# Patient Record
Sex: Male | Born: 1945
Health system: Southern US, Community
[De-identification: ages and names within clinical notes are randomized; demographics above are authoritative.]

## PROBLEM LIST (undated history)

## (undated) DIAGNOSIS — R2681 Unsteadiness on feet: Secondary | ICD-10-CM

## (undated) DIAGNOSIS — I519 Heart disease, unspecified: Secondary | ICD-10-CM

## (undated) DIAGNOSIS — K649 Unspecified hemorrhoids: Secondary | ICD-10-CM

## (undated) DIAGNOSIS — E7800 Pure hypercholesterolemia, unspecified: Secondary | ICD-10-CM

## (undated) DIAGNOSIS — M6289 Other specified disorders of muscle: Secondary | ICD-10-CM

## (undated) DIAGNOSIS — I629 Nontraumatic intracranial hemorrhage, unspecified: Secondary | ICD-10-CM

## (undated) DIAGNOSIS — I517 Cardiomegaly: Secondary | ICD-10-CM

## (undated) DIAGNOSIS — D751 Secondary polycythemia: Secondary | ICD-10-CM

## (undated) DIAGNOSIS — E669 Obesity, unspecified: Secondary | ICD-10-CM

## (undated) DIAGNOSIS — K648 Other hemorrhoids: Secondary | ICD-10-CM

## (undated) DIAGNOSIS — I10 Essential (primary) hypertension: Secondary | ICD-10-CM

## (undated) DIAGNOSIS — C61 Malignant neoplasm of prostate: Secondary | ICD-10-CM

## (undated) DIAGNOSIS — N201 Calculus of ureter: Secondary | ICD-10-CM

## (undated) DIAGNOSIS — N200 Calculus of kidney: Secondary | ICD-10-CM

## (undated) DIAGNOSIS — I639 Cerebral infarction, unspecified: Secondary | ICD-10-CM

## (undated) DIAGNOSIS — B029 Zoster without complications: Secondary | ICD-10-CM

## (undated) DIAGNOSIS — D369 Benign neoplasm, unspecified site: Secondary | ICD-10-CM

## (undated) DIAGNOSIS — Z87442 Personal history of urinary calculi: Secondary | ICD-10-CM

## (undated) DIAGNOSIS — G08 Intracranial and intraspinal phlebitis and thrombophlebitis: Secondary | ICD-10-CM

## (undated) DIAGNOSIS — G4733 Obstructive sleep apnea (adult) (pediatric): Secondary | ICD-10-CM

## (undated) HISTORY — DX: Zoster without complications: B02.9

## (undated) HISTORY — DX: Obesity, unspecified: E66.9

## (undated) HISTORY — DX: Essential (primary) hypertension: I10

## (undated) HISTORY — DX: Other specified disorders of muscle: M62.89

## (undated) HISTORY — PX: STONE EXTRACTION WITH BASKET: SHX5318

## (undated) HISTORY — DX: Unspecified hemorrhoids: K64.9

## (undated) HISTORY — PX: COLONOSCOPY: SHX174

## (undated) HISTORY — DX: Pure hypercholesterolemia, unspecified: E78.00

## (undated) HISTORY — PX: PROSTATECTOMY: SHX69

## (undated) HISTORY — PX: APPENDECTOMY: SHX54

## (undated) HISTORY — DX: Benign neoplasm, unspecified site: D36.9

## (undated) HISTORY — DX: Calculus of kidney: N20.0

## (undated) HISTORY — DX: Secondary polycythemia: D75.1

## (undated) HISTORY — PX: CHOLECYSTECTOMY: SHX55

---

## 2001-06-10 DIAGNOSIS — C61 Malignant neoplasm of prostate: Secondary | ICD-10-CM

## 2001-06-10 HISTORY — DX: Malignant neoplasm of prostate: C61

## 2001-07-31 ENCOUNTER — Encounter: Admission: RE | Admit: 2001-07-31 | Discharge: 2001-07-31 | Payer: Self-pay | Admitting: Urology

## 2001-07-31 ENCOUNTER — Encounter: Payer: Self-pay | Admitting: Urology

## 2002-05-22 ENCOUNTER — Ambulatory Visit (HOSPITAL_BASED_OUTPATIENT_CLINIC_OR_DEPARTMENT_OTHER): Admission: RE | Admit: 2002-05-22 | Discharge: 2002-05-22 | Payer: Self-pay | Admitting: Geriatric Medicine

## 2002-07-09 ENCOUNTER — Encounter: Admission: RE | Admit: 2002-07-09 | Discharge: 2002-07-09 | Payer: Self-pay | Admitting: Geriatric Medicine

## 2002-07-09 ENCOUNTER — Encounter: Payer: Self-pay | Admitting: Geriatric Medicine

## 2002-08-10 ENCOUNTER — Encounter: Admission: RE | Admit: 2002-08-10 | Discharge: 2002-08-10 | Payer: Self-pay | Admitting: Geriatric Medicine

## 2002-08-10 ENCOUNTER — Encounter: Payer: Self-pay | Admitting: Geriatric Medicine

## 2002-08-18 ENCOUNTER — Encounter: Admission: RE | Admit: 2002-08-18 | Discharge: 2002-08-18 | Payer: Self-pay | Admitting: Geriatric Medicine

## 2002-08-18 ENCOUNTER — Encounter: Payer: Self-pay | Admitting: Geriatric Medicine

## 2003-03-18 ENCOUNTER — Emergency Department (HOSPITAL_COMMUNITY): Admission: EM | Admit: 2003-03-18 | Discharge: 2003-03-18 | Payer: Self-pay | Admitting: Emergency Medicine

## 2003-04-07 ENCOUNTER — Ambulatory Visit (HOSPITAL_BASED_OUTPATIENT_CLINIC_OR_DEPARTMENT_OTHER): Admission: RE | Admit: 2003-04-07 | Discharge: 2003-04-07 | Payer: Self-pay | Admitting: Urology

## 2003-07-03 ENCOUNTER — Ambulatory Visit (HOSPITAL_BASED_OUTPATIENT_CLINIC_OR_DEPARTMENT_OTHER): Admission: RE | Admit: 2003-07-03 | Discharge: 2003-07-03 | Payer: Self-pay | Admitting: Geriatric Medicine

## 2004-12-17 ENCOUNTER — Ambulatory Visit (HOSPITAL_BASED_OUTPATIENT_CLINIC_OR_DEPARTMENT_OTHER): Admission: RE | Admit: 2004-12-17 | Discharge: 2004-12-17 | Payer: Self-pay | Admitting: Urology

## 2004-12-17 ENCOUNTER — Ambulatory Visit (HOSPITAL_COMMUNITY): Admission: RE | Admit: 2004-12-17 | Discharge: 2004-12-17 | Payer: Self-pay | Admitting: Urology

## 2005-01-11 ENCOUNTER — Ambulatory Visit (HOSPITAL_BASED_OUTPATIENT_CLINIC_OR_DEPARTMENT_OTHER): Admission: RE | Admit: 2005-01-11 | Discharge: 2005-01-11 | Payer: Self-pay | Admitting: Urology

## 2008-02-12 DIAGNOSIS — N201 Calculus of ureter: Secondary | ICD-10-CM

## 2008-02-12 HISTORY — DX: Calculus of ureter: N20.1

## 2008-03-03 ENCOUNTER — Ambulatory Visit (HOSPITAL_COMMUNITY): Admission: RE | Admit: 2008-03-03 | Discharge: 2008-03-03 | Payer: Self-pay | Admitting: Urology

## 2008-09-13 ENCOUNTER — Encounter (INDEPENDENT_AMBULATORY_CARE_PROVIDER_SITE_OTHER): Payer: Self-pay | Admitting: *Deleted

## 2009-02-13 ENCOUNTER — Emergency Department (HOSPITAL_COMMUNITY): Admission: EM | Admit: 2009-02-13 | Discharge: 2009-02-13 | Payer: Self-pay | Admitting: Family Medicine

## 2009-02-20 ENCOUNTER — Ambulatory Visit (HOSPITAL_COMMUNITY): Admission: RE | Admit: 2009-02-20 | Discharge: 2009-02-20 | Payer: Self-pay | Admitting: Urology

## 2009-06-22 ENCOUNTER — Telehealth: Payer: Self-pay | Admitting: Gastroenterology

## 2009-07-20 ENCOUNTER — Ambulatory Visit (HOSPITAL_BASED_OUTPATIENT_CLINIC_OR_DEPARTMENT_OTHER): Admission: RE | Admit: 2009-07-20 | Discharge: 2009-07-20 | Payer: Self-pay | Admitting: Urology

## 2010-07-10 NOTE — Progress Notes (Signed)
Summary: Change Practice --- GI  Phone Note Outgoing Call Call back at [facility] Phone [phone]   Call placed by: Harlow Mares CMA Duncan Dull),  June 22, 2009 1:43 PM Call placed to: Patient Summary of Call: called patient to schedule colonoscopy and already had a colonoscopy with another GI in town. She changed practice, I had Yesi put a note in IDX.  Initial call taken by: Harlow Mares CMA (AAMA),  June 22, 2009 1:44 PM

## 2010-09-14 LAB — POCT URINALYSIS DIP (DEVICE)
Nitrite: NEGATIVE
Protein, ur: NEGATIVE mg/dL
pH: 5.5 (ref 5.0–8.0)

## 2010-09-14 LAB — BASIC METABOLIC PANEL
CO2: 26 mEq/L (ref 19–32)
Chloride: 106 mEq/L (ref 96–112)
Creatinine, Ser: 0.97 mg/dL (ref 0.4–1.5)
GFR calc Af Amer: >60 mL/min (ref >60)
Glucose, Bld: 89 mg/dL (ref 70–99)

## 2010-10-26 NOTE — Op Note (Signed)
NAME:  Justin Adams, Justin Adams           ACCOUNT NO.:  [id number]   MEDICAL RECORD NO.:  [id number]          PATIENT TYPE:  AMB   LOCATION:  [facility]                         FACILITY:  [facility]   PHYSICIAN:  Sigmund I. Patsi Sears, M.D.DATE OF BIRTH:  07-Dec-1945   DATE OF PROCEDURE:  01/11/2005  DATE OF DISCHARGE:                                 OPERATIVE REPORT   PREOPERATIVE DIAGNOSIS:  Left ureteral stone.   POSTOPERATIVE DIAGNOSIS:  1.  Left ureteral stone.  2.  Left ureteral stricture.   PROCEDURE:  1.  Cystourethroscopy.  2.  Retrograde pyelogram.  3.  Left ureteral dilatation of stricture.  4.  Left ureteroscopy with laser lithotripsy and stone manipulation.  5.  Left double J stent placement.   SURGEON:  Sigmund I. Patsi Sears, M.D.   ASSISTANT:  Glade Nurse, M.D.   ANESTHESIA:  General endotracheal.   SPECIMENS:  None.   PROCEDURE:  The patient was identified by his wrist bracelet and brought to  room 2 where he received preoperative antibiotics and was prepped and draped  in the usual sterile fashion after receiving general anesthesia. We then  inserted a 22-French cystoscopic sheath, and using 12 and 70 degree lenses,  the patient underwent cystourethroscopy. His anterior urethra was without  abnormalities. Posterior urethra was noted for changes status post radical  prostatectomy. He was noted to have bladder neck contraction upon entering  his bladder. His bilateral ureteral orifices were noted in their normal  anatomic position, effluxing clear urine bilaterally. The remainder of his  mucosa was without foreign body or mucosa abnormality. Next, we turned our  attention to the left ureteral orifice. Using a 6-French end-hole catheter,  retrograde pyelogram was obtained which showed high grade stricture in the  left mid ureter. We then were able to place a 0.038 Glidewire past the  ureteral stricture to the level of the left renal pelvis under direct  fluoroscopic  guidance. We then placed a semi-rigid ureteroscope in the left  ureter and gently manipulated to the level of the ureteral stricture. We  were not able to pass the scope through this point. Next, we back loaded the  scope out of the patient's bladder. Over the wire, we placed an ureteral  dilating balloon which we inflated to 11 atmospheres and held for 5 minutes.  While doing this, we noted the waist at the level of the stricture did not  completely disappear. We then back loaded the balloon off the wire and  reinserted our semi-rigid ureteroscope. We were able to easily pass the  stricture. There was noted to be some element of stone impaction at the  ureteral stricture site. Just proximal to this, we noted a 4-mm ureteral  stone. Then, we inserted the laser fiber, and using settings of 0.6 and 8,  the stone was fragmented into 7-mm pieces which were basketed out with a  Nitinol basket. Once we ensured that all impassible fragments were removed  and only fragments less than 0.5 mm in size were remaining, we removed the  uteroscope, and the cystoscope was back loaded over the wire. We then placed  a  6 x 28 Sof-Flex double J stent to the level of the left renal pelvis with  fluoroscopic guidance. Excellent proximal and distal curl was obtained. The  bladder was then drained. The patient was opened from his anesthesia which  he tolerated without complication and was taken to the PACU in stable,  satisfactory condition.   Dr. Lynelle Smoke I. Patsi Sears was present and participated in all aspects of  this case.       MT/MEDQ  D:  01/11/2005  T:  01/12/2005  Job:  [id number]

## 2010-10-26 NOTE — Op Note (Signed)
   NAME:  Justin Adams, Justin Adams                     ACCOUNT NO.:  [id number]   MEDICAL RECORD NO.:  [id number]                   PATIENT TYPE:  AMB   LOCATION:  [facility]                                 FACILITY:  [facility]   PHYSICIAN:  Sigmund I. Patsi Sears, M.D.         DATE OF BIRTH:  12-22-1945   DATE OF PROCEDURE:  04/07/2003  DATE OF DISCHARGE:                                 OPERATIVE REPORT   PREOPERATIVE DIAGNOSIS:  Retained right distal ureteral stone.   POSTOPERATIVE DIAGNOSIS:  Retained right distal ureteral stone.   OPERATION:  1. Cystourethroscopy.  2. Left retrograde pyelogram with interpretation.  3. Ureteroscopy.  4. Basket extraction of right ureteral stone.  5. Right double J catheter.   SURGEON:  Sigmund I. Patsi Sears, M.D.   ASSISTANT:  Susanne Borders, M.D.   PREPARATION:  After appropriate preanesthesia, the patient was brought to  the operating room and placed on the operating table in the dorsal supine  position, where general LMA anesthesia was introduced.  He was then replaced  in the dorsal lithotomy position, where the pubis was prepped with Betadine  solution and draped in the usual fashion.   DESCRIPTION OF PROCEDURE:  KUB did not show the right ureteral stone which  had been previously seen on noncontrast pelvic CT.  Right retrograde  pyelogram was performed, which showed a 6 mm stone in the distal right  ureter, and right ureteroscopy was accomplished, which showed the stone to  be in the right lower ureter. The stone was basket-extracted, with very  narrow ureteral orifice noted.  Because of trauma to the right ureteral  orifice in extracting the stone, it was elected to pass a double J catheter.  This was passed easily into the renal pelvis and coiled in the renal pelvis  and coiled in the bladder.  A size 6.0 x 28 cm was used.  The patient was  given IV Toradol as well as a B&O suppository.                                               Sigmund I.  Patsi Sears, M.D.    SIT/MEDQ  D:  04/07/2003  T:  04/07/2003  Job:  [id number]

## 2010-10-26 NOTE — Op Note (Signed)
NAME:  MAITLAND, LESIAK           ACCOUNT NO.:  [id number]   MEDICAL RECORD NO.:  [id number]          PATIENT TYPE:  AMB   LOCATION:  [facility]                         FACILITY:  [facility]   PHYSICIAN:  Lindaann Slough, M.D.  DATE OF BIRTH:  1945/07/05   DATE OF PROCEDURE:  12/17/2004  DATE OF DISCHARGE:                                 OPERATIVE REPORT   PREOPERATIVE DIAGNOSIS:  Left ureteral calculus with hydronephrosis.   POSTOPERATIVE DIAGNOSIS:  Left ureteral stricture,  ureteral calculus not  seen.   PROCEDURE:  Cystoscopy, left retrograde pyelogram, ureteroscopy, ureteral  balloon dilation of the ureter and insertion of double-J catheter.   SURGEON:  Wendie Simmer.Nesi, M.D.   ANESTHESIA:  General.   INDICATIONS FOR PROCEDURE:  The patient is a 65 year old male who has a  history of ureteral calculus. In December 2005, a CT scan of the abdomen and  pelvis showed a 3 mm stone in the left ureter at the level of L5. Repeat CT  scan in April 2005 showed the stone in the same location. About three weeks  ago, he started having left flank pain and a repeat CT scan showed the stone  in the same location. He has moderate hydronephrosis. He is scheduled today  for cystoscopy, left retrograde pyelogram, stone manipulation.   Under general anesthesia, the patient was prepped and draped and placed in  the dorsal lithotomy position. A #22 Wappler cystoscope was inserted in the  bladder. The patient had a radical prostatectomy and the bladder neck is  open. There is no stone or tumor in the bladder. The ureteral orifices are  in normal position and shape.   A cone-tip catheter was passed through the cystoscope into the left ureteral  orifice and contrast was then injected through the cone-tip catheter. The  distal ureter appears normal. There is an area of stricture at the level of  the iliac vessels and the ureter proximal to that area is moderately dilated  and the renal pelvis is also moderately  dilated. The cone tip catheter was  then removed.   A guidewire was then passed through the cystoscope into the left ureter. The  cystoscope was then removed. A #6.5 French rigid short ureteroscope was then  passed in the urethra and in the bladder and into the ureter. The  ureteroscope was advanced without difficulty up to the area of stricture but  could not be passed beyond that point. The ureteroscope was then removed. A  ureteroscope access sheath was then passed over the guidewire but could not  be passed beyond the point of stricture. The ureteroscope access sheath was  then removed.  A ureteral balloon catheter was then passed over the  guidewire but could not be passed beyond the point of stricture. The  ureteral balloon catheter was then removed. A #6 [place] open end catheter  was passed over the guidewire through the strictured area. The open end  catheter was then removed, the ureteroscope access sheath was then passed  over the guidewire without the outer sheath and passed through the  strictured area. The ureteroscope access sheath was then removed.  A rigid  ureteroscope was then reinserted in the bladder and in the ureter and  advanced through the mid ureter without difficulty and advanced up to the  upper ureter. There is no evidence of filling defect in the ureter. No stone  is visualized. The short ureteroscope could not be advanced all the way up  into the renal pelvis. The short ureteroscope  was removed. A long  ureteroscope was then passed in the bladder and in the ureter and advanced  all the way up into the renal pelvis and there is no evidence of stone in  the ureter. The ureteroscope was then removed. The guidewire was back loaded  into the cystoscope and a #6 [place] - 26 double-J catheter was passed over  the guidewire. The proximal curl of the double-J catheter is in the renal  pelvis and the distal curl is in the bladder. The bladder was then emptied  and the  cystoscope and guidewire were removed.   The patient tolerated the procedure well and left the OR in satisfactory  condition to post anesthesia care unit.       MN/MEDQ  D:  12/17/2004  T:  12/17/2004  Job:  [id number]

## 2011-10-08 DIAGNOSIS — H353190 Nonexudative age-related macular degeneration, unspecified eye, stage unspecified: Secondary | ICD-10-CM | POA: Diagnosis not present

## 2011-10-08 DIAGNOSIS — H2510 Age-related nuclear cataract, unspecified eye: Secondary | ICD-10-CM | POA: Diagnosis not present

## 2012-03-02 DIAGNOSIS — C61 Malignant neoplasm of prostate: Secondary | ICD-10-CM | POA: Diagnosis not present

## 2012-03-02 DIAGNOSIS — E559 Vitamin D deficiency, unspecified: Secondary | ICD-10-CM | POA: Diagnosis not present

## 2012-03-06 DIAGNOSIS — C61 Malignant neoplasm of prostate: Secondary | ICD-10-CM | POA: Diagnosis not present

## 2012-03-06 DIAGNOSIS — N23 Unspecified renal colic: Secondary | ICD-10-CM | POA: Diagnosis not present

## 2012-04-06 DIAGNOSIS — N393 Stress incontinence (female) (male): Secondary | ICD-10-CM | POA: Diagnosis not present

## 2012-04-06 DIAGNOSIS — N3945 Continuous leakage: Secondary | ICD-10-CM | POA: Diagnosis not present

## 2012-04-06 DIAGNOSIS — R3915 Urgency of urination: Secondary | ICD-10-CM | POA: Diagnosis not present

## 2012-04-14 DIAGNOSIS — Z09 Encounter for follow-up examination after completed treatment for conditions other than malignant neoplasm: Secondary | ICD-10-CM | POA: Diagnosis not present

## 2012-04-14 DIAGNOSIS — Z86010 Personal history of colonic polyps: Secondary | ICD-10-CM | POA: Diagnosis not present

## 2012-05-30 DIAGNOSIS — Z23 Encounter for immunization: Secondary | ICD-10-CM | POA: Diagnosis not present

## 2012-06-23 DIAGNOSIS — H353190 Nonexudative age-related macular degeneration, unspecified eye, stage unspecified: Secondary | ICD-10-CM | POA: Diagnosis not present

## 2012-07-29 DIAGNOSIS — E669 Obesity, unspecified: Secondary | ICD-10-CM | POA: Diagnosis not present

## 2012-07-29 DIAGNOSIS — Z0000 Encounter for general adult medical examination without abnormal findings: Secondary | ICD-10-CM | POA: Diagnosis not present

## 2012-07-29 DIAGNOSIS — E782 Mixed hyperlipidemia: Secondary | ICD-10-CM | POA: Diagnosis not present

## 2012-07-29 DIAGNOSIS — R002 Palpitations: Secondary | ICD-10-CM | POA: Diagnosis not present

## 2012-07-29 DIAGNOSIS — Z79899 Other long term (current) drug therapy: Secondary | ICD-10-CM | POA: Diagnosis not present

## 2012-07-29 DIAGNOSIS — Z1331 Encounter for screening for depression: Secondary | ICD-10-CM | POA: Diagnosis not present

## 2012-09-09 DIAGNOSIS — G4730 Sleep apnea, unspecified: Secondary | ICD-10-CM | POA: Diagnosis not present

## 2012-09-21 DIAGNOSIS — J029 Acute pharyngitis, unspecified: Secondary | ICD-10-CM | POA: Diagnosis not present

## 2013-02-23 DIAGNOSIS — H353190 Nonexudative age-related macular degeneration, unspecified eye, stage unspecified: Secondary | ICD-10-CM | POA: Diagnosis not present

## 2013-02-23 DIAGNOSIS — H25019 Cortical age-related cataract, unspecified eye: Secondary | ICD-10-CM | POA: Diagnosis not present

## 2013-03-03 DIAGNOSIS — C61 Malignant neoplasm of prostate: Secondary | ICD-10-CM | POA: Diagnosis not present

## 2013-03-10 DIAGNOSIS — N200 Calculus of kidney: Secondary | ICD-10-CM | POA: Diagnosis not present

## 2013-03-10 DIAGNOSIS — C61 Malignant neoplasm of prostate: Secondary | ICD-10-CM | POA: Diagnosis not present

## 2013-03-10 DIAGNOSIS — E559 Vitamin D deficiency, unspecified: Secondary | ICD-10-CM | POA: Diagnosis not present

## 2013-03-10 DIAGNOSIS — N393 Stress incontinence (female) (male): Secondary | ICD-10-CM | POA: Diagnosis not present

## 2013-03-10 DIAGNOSIS — R0789 Other chest pain: Secondary | ICD-10-CM | POA: Diagnosis not present

## 2013-03-11 ENCOUNTER — Encounter: Payer: Self-pay | Admitting: Cardiology

## 2013-03-11 ENCOUNTER — Ambulatory Visit (INDEPENDENT_AMBULATORY_CARE_PROVIDER_SITE_OTHER): Payer: Medicare Other | Admitting: Cardiology

## 2013-03-11 ENCOUNTER — Encounter: Payer: Self-pay | Admitting: *Deleted

## 2013-03-11 VITALS — BP 140/74 | HR 74 | Ht 75.0 in | Wt 267.0 lb

## 2013-03-11 DIAGNOSIS — I200 Unstable angina: Secondary | ICD-10-CM

## 2013-03-11 DIAGNOSIS — R079 Chest pain, unspecified: Secondary | ICD-10-CM | POA: Diagnosis not present

## 2013-03-11 MED ORDER — METOPROLOL SUCCINATE ER 50 MG PO TB24: 25.0000 mg | ORAL_TABLET | Freq: Every day | ORAL | Status: DC

## 2013-03-11 MED ORDER — ASPIRIN EC 81 MG PO TBEC: 81.0000 mg | DELAYED_RELEASE_TABLET | Freq: Every day | ORAL | Status: DC

## 2013-03-11 NOTE — Progress Notes (Signed)
HPI The patient presents for evaluation of chest discomfort. He has not had prior coronary disease history though he has had a catheterization and stress test many many years ago. He noticed last week chest tightness when he was exercising on the elliptical. This was a heaviness. It was 3/10 in intensity. There was no associated nausea vomiting or diaphoresis. He was not excessively short of breath. He didn't have any PND or orthopnea. Because this was reproducible on this piece of equipment he stop doing this. He's been able to walk mildly on a treadmill without bringing this on. However, he has been having some resting symptoms to include excessive burping and discomfort after eating. He's not having any PND or orthopnea. He might be having some palpitations when he gets the resting symptoms.   Allergies  Allergen Reactions  . Iodinated Diagnostic Agents Rash    Current Outpatient Prescriptions  Medication Sig Dispense Refill  . cholecalciferol (VITAMIN D) 1000 UNITS tablet Take 1,000 Units by mouth daily.      . Sod Citrate-Citric Acid (CYTRA-2 PO) Take 60 mg by mouth 2 (two) times daily.       No current facility-administered medications for this visit.    Past Medical History  Diagnosis Date  . Obesity   . Hypertension   . Nephrolithiasis   . Cancer 2003    prostate  . Adenomatous polyp   . Sleep apnea   . Hemorrhoids   . Polycythemia   . Hypercholesteremia   . Shingles   . Muscular degeneration     Past Surgical History  Procedure Laterality Date  . Prostatectomy    . Cholecystectomy    . Stone extraction with basket      Family History  Problem Relation Age of Onset  . Hypertension Mother   . Coronary artery disease Mother 44  . Breast cancer Mother   . CAD Brother 48  . Coronary artery disease Father 40    Died age 32    History   Social History  . Marital Status: Married    Spouse Name: N/A    Number of Children: 2  . Years of Education: N/A    Occupational History  . Buyer     [employer]   Social History Main Topics  . Smoking status: Never Smoker   . Smokeless tobacco: Not on file  . Alcohol Use: Not on file  . Drug Use: Not on file  . Sexual Activity: Not on file   Other Topics Concern  . Not on file   Social History Narrative  . No narrative on file    ROS:  As stated in the HPI and negative for all other systems.  PHYSICAL EXAM BP 140/74  Pulse 74  Ht 6\' 3"  (1.905 m)  Wt 267 lb (121.11 kg)  BMI 33.37 kg/m2 GENERAL:  Well appearing HEENT:  Pupils equal round and reactive, fundi not visualized, oral mucosa unremarkable NECK:  No jugular venous distention, waveform within normal limits, carotid upstroke brisk and symmetric, no bruits, no thyromegaly LYMPHATICS:  No cervical, inguinal adenopathy LUNGS:  Clear to auscultation bilaterally BACK:  No CVA tenderness CHEST:  Unremarkable HEART:  PMI not displaced or sustained,S1 and S2 within normal limits, no S3, no S4, no clicks, no rubs, no murmurs ABD:  Flat, positive bowel sounds normal in frequency in pitch, no bruits, no rebound, no guarding, no midline pulsatile mass, no hepatomegaly, no splenomegaly EXT:  2 plus pulses throughout, no edema,  no cyanosis no clubbing SKIN:  No rashes no nodules NEURO:  Cranial nerves II through XII grossly intact, motor grossly intact throughout PSYCH:  Cognitively intact, oriented to person place and time  EKG:  Sinus rhythm, rate 74, axis within normal limits, intervals within normal limits, no acute ST-T wave changes.  ASSESSMENT AND PLAN  CHEST PAIN:  This is consistent with resting chest pain.  This is consistent with unstable anigna.  He has significant risk factors.  The pretest probability of obstructive coronary disease is very high. Given this cardiac catheterization is indicated.  According to appropriate use criteria with these new onset/unstable symptoms cardiac catheterization is the indicated test.  The patient  understands that risks included but are not limited to stroke (1 in 1000), death (1 in 1000), kidney failure [usually temporary] (1 in 500), bleeding (1 in 200), allergic reaction [possibly serious] (1 in 200).  The patient understands and agrees to proceed.  He is gong to start ASA and a low dose of beta blocker.   HTN:  His blood pressure is upper limits of normal.   We will concentrate on therapeutic lifestyle changes to include weight loss.   HYPERLIPIDEMIA:  He says he has not had this.  I will treat this based on findings at the time of cath.

## 2013-03-11 NOTE — Patient Instructions (Addendum)
Please start ASA 81 mg a day and Metoprolol succinate 50 mg 1/2 tablet a day. Continue all other medications as listed.  Your physician has requested that you have a cardiac catheterization. Cardiac catheterization is used to diagnose and/or treat various heart conditions. Doctors may recommend this procedure for a number of different reasons. The most common reason is to evaluate chest pain. Chest pain can be a symptom of coronary artery disease (CAD), and cardiac catheterization can show whether plaque is narrowing or blocking your heart's arteries. This procedure is also used to evaluate the valves, as well as measure the blood flow and oxygen levels in different parts of your heart. For further information please visit [email or web address] Please follow instruction sheet, as given.  Follow up appointment with Dr Antoine Poche will be made after your heart cath.

## 2013-03-12 ENCOUNTER — Other Ambulatory Visit: Payer: Self-pay | Admitting: Cardiology

## 2013-03-12 ENCOUNTER — Encounter (HOSPITAL_COMMUNITY): Payer: Self-pay

## 2013-03-12 ENCOUNTER — Telehealth: Payer: Self-pay | Admitting: Cardiology

## 2013-03-12 DIAGNOSIS — I200 Unstable angina: Secondary | ICD-10-CM | POA: Insufficient documentation

## 2013-03-12 MED ORDER — PREDNISONE 20 MG PO TABS: ORAL_TABLET | ORAL | Status: DC

## 2013-03-12 NOTE — Telephone Encounter (Signed)
Reviewed instructions and times with pt - pre-med.  Pt understands pre-med instructions

## 2013-03-12 NOTE — Telephone Encounter (Signed)
New Problem  Pt request a call back about the heart cath.. How Long will the procedure take.

## 2013-03-15 ENCOUNTER — Ambulatory Visit (HOSPITAL_COMMUNITY)
Admission: RE | Admit: 2013-03-15 | Discharge: 2013-03-15 | Disposition: A | Discharge status: Home / Self Care | Payer: Medicare Other | Source: Ambulatory Visit | Attending: Cardiology | Admitting: Cardiology

## 2013-03-15 ENCOUNTER — Encounter (HOSPITAL_COMMUNITY)
Admission: RE | Disposition: A | Discharge status: Home / Self Care | Payer: Self-pay | Source: Ambulatory Visit | Attending: Cardiology

## 2013-03-15 DIAGNOSIS — I10 Essential (primary) hypertension: Secondary | ICD-10-CM | POA: Diagnosis not present

## 2013-03-15 DIAGNOSIS — I200 Unstable angina: Secondary | ICD-10-CM

## 2013-03-15 DIAGNOSIS — E669 Obesity, unspecified: Secondary | ICD-10-CM | POA: Insufficient documentation

## 2013-03-15 DIAGNOSIS — R079 Chest pain, unspecified: Secondary | ICD-10-CM | POA: Diagnosis not present

## 2013-03-15 DIAGNOSIS — I2510 Atherosclerotic heart disease of native coronary artery without angina pectoris: Secondary | ICD-10-CM | POA: Insufficient documentation

## 2013-03-15 HISTORY — PX: LEFT HEART CATHETERIZATION WITH CORONARY ANGIOGRAM: SHX5451

## 2013-03-15 LAB — CBC
MCHC: 34.3 g/dL (ref 30.0–36.0)
MCV: 83.5 fL (ref 78.0–100.0)
Platelets: 185 10*3/uL (ref 150–400)
RDW: 13.6 % (ref 11.5–15.5)
WBC: 5.3 10*3/uL (ref 4.0–10.5)

## 2013-03-15 LAB — BASIC METABOLIC PANEL
BUN: 21 mg/dL (ref 6–23)
CO2: 25 mEq/L (ref 19–32)
Calcium: 8.9 mg/dL (ref 8.4–10.5)
Creatinine, Ser: 0.73 mg/dL (ref 0.50–1.35)
Potassium: 4.1 mEq/L (ref 3.5–5.1)

## 2013-03-15 LAB — PROTIME-INR: Prothrombin Time: 13.9 seconds (ref 11.6–15.2)

## 2013-03-15 SURGERY — LEFT HEART CATHETERIZATION WITH CORONARY ANGIOGRAM
Anesthesia: LOCAL

## 2013-03-15 MED ORDER — MIDAZOLAM HCL 2 MG/2ML IJ SOLN
INTRAMUSCULAR | Status: AC
  Filled 2013-03-15: qty 2

## 2013-03-15 MED ORDER — ONDANSETRON HCL 4 MG/2ML IJ SOLN: 4.0000 mg | Freq: Four times a day (QID) | INTRAMUSCULAR | Status: DC | PRN

## 2013-03-15 MED ORDER — HEPARIN SODIUM (PORCINE) 1000 UNIT/ML IJ SOLN
INTRAMUSCULAR | Status: AC
  Filled 2013-03-15: qty 1

## 2013-03-15 MED ORDER — SODIUM CHLORIDE 0.9 % IV SOLN: INTRAVENOUS | Status: DC

## 2013-03-15 MED ORDER — ACETAMINOPHEN 325 MG PO TABS
ORAL_TABLET | ORAL | Status: AC
  Filled 2013-03-15: qty 2

## 2013-03-15 MED ORDER — LIDOCAINE HCL (PF) 1 % IJ SOLN
INTRAMUSCULAR | Status: AC
  Filled 2013-03-15: qty 30

## 2013-03-15 MED ORDER — SODIUM CHLORIDE 0.9 % IV SOLN: 250.0000 mL | INTRAVENOUS | Status: DC | PRN

## 2013-03-15 MED ORDER — ACETAMINOPHEN 325 MG PO TABS
650.0000 mg | ORAL_TABLET | ORAL | Status: DC | PRN
  Administered 2013-03-15: 650 mg via ORAL

## 2013-03-15 MED ORDER — NITROGLYCERIN 0.2 MG/ML ON CALL CATH LAB
INTRAVENOUS | Status: AC
  Filled 2013-03-15: qty 1

## 2013-03-15 MED ORDER — FAMOTIDINE 20 MG PO TABS
20.0000 mg | ORAL_TABLET | ORAL | Status: AC
  Administered 2013-03-15: 20 mg via ORAL
  Filled 2013-03-15: qty 1

## 2013-03-15 MED ORDER — VERAPAMIL HCL 2.5 MG/ML IV SOLN
INTRAVENOUS | Status: AC
  Filled 2013-03-15: qty 2

## 2013-03-15 MED ORDER — DIPHENHYDRAMINE HCL 50 MG/ML IJ SOLN: 25.0000 mg | INTRAMUSCULAR | Status: DC

## 2013-03-15 MED ORDER — PREDNISONE 20 MG PO TABS: 60.0000 mg | ORAL_TABLET | ORAL | Status: DC

## 2013-03-15 MED ORDER — SODIUM CHLORIDE 0.9 % IJ SOLN: 3.0000 mL | Freq: Two times a day (BID) | INTRAMUSCULAR | Status: DC

## 2013-03-15 MED ORDER — SODIUM CHLORIDE 0.9 % IJ SOLN: 3.0000 mL | INTRAMUSCULAR | Status: DC | PRN

## 2013-03-15 MED ORDER — PREDNISONE 20 MG PO TABS
60.0000 mg | ORAL_TABLET | ORAL | Status: AC
  Administered 2013-03-15: 60 mg via ORAL

## 2013-03-15 MED ORDER — ASPIRIN 81 MG PO CHEW
81.0000 mg | CHEWABLE_TABLET | ORAL | Status: AC
  Administered 2013-03-15: 81 mg via ORAL

## 2013-03-15 MED ORDER — HEPARIN (PORCINE) IN NACL 2-0.9 UNIT/ML-% IJ SOLN
INTRAMUSCULAR | Status: AC
  Filled 2013-03-15: qty 1000

## 2013-03-15 NOTE — H&P (View-Only) (Signed)
  HPI The patient presents for evaluation of chest discomfort. He has not had prior coronary disease history though he has had a catheterization and stress test many many years ago. He noticed last week chest tightness when he was exercising on the elliptical. This was a heaviness. It was 3/10 in intensity. There was no associated nausea vomiting or diaphoresis. He was not excessively short of breath. He didn't have any PND or orthopnea. Because this was reproducible on this piece of equipment he stop doing this. He's been able to walk mildly on a treadmill without bringing this on. However, he has been having some resting symptoms to include excessive burping and discomfort after eating. He's not having any PND or orthopnea. He might be having some palpitations when he gets the resting symptoms.   Allergies  Allergen Reactions  . Iodinated Diagnostic Agents Rash    Current Outpatient Prescriptions  Medication Sig Dispense Refill  . cholecalciferol (VITAMIN D) 1000 UNITS tablet Take 1,000 Units by mouth daily.      . Sod Citrate-Citric Acid (CYTRA-2 PO) Take 60 mg by mouth 2 (two) times daily.       No current facility-administered medications for this visit.    Past Medical History  Diagnosis Date  . Obesity   . Hypertension   . Nephrolithiasis   . Cancer 2003    prostate  . Adenomatous polyp   . Sleep apnea   . Hemorrhoids   . Polycythemia   . Hypercholesteremia   . Shingles   . Muscular degeneration     Past Surgical History  Procedure Laterality Date  . Prostatectomy    . Cholecystectomy    . Stone extraction with basket      Family History  Problem Relation Age of Onset  . Hypertension Mother   . Coronary artery disease Mother 88  . Breast cancer Mother   . CAD Brother 50  . Coronary artery disease Father 50    Died age 76    History   Social History  . Marital Status: Married    Spouse Name: N/A    Number of Children: 2  . Years of Education: N/A    Occupational History  . Buyer     [employer]   Social History Main Topics  . Smoking status: Never Smoker   . Smokeless tobacco: Not on file  . Alcohol Use: Not on file  . Drug Use: Not on file  . Sexual Activity: Not on file   Other Topics Concern  . Not on file   Social History Narrative  . No narrative on file    ROS:  As stated in the HPI and negative for all other systems.  PHYSICAL EXAM BP 140/74  Pulse 74  Ht 6' 3" (1.905 m)  Wt 267 lb (121.11 kg)  BMI 33.37 kg/m2 GENERAL:  Well appearing HEENT:  Pupils equal round and reactive, fundi not visualized, oral mucosa unremarkable NECK:  No jugular venous distention, waveform within normal limits, carotid upstroke brisk and symmetric, no bruits, no thyromegaly LYMPHATICS:  No cervical, inguinal adenopathy LUNGS:  Clear to auscultation bilaterally BACK:  No CVA tenderness CHEST:  Unremarkable HEART:  PMI not displaced or sustained,S1 and S2 within normal limits, no S3, no S4, no clicks, no rubs, no murmurs ABD:  Flat, positive bowel sounds normal in frequency in pitch, no bruits, no rebound, no guarding, no midline pulsatile mass, no hepatomegaly, no splenomegaly EXT:  2 plus pulses throughout, no edema,   no cyanosis no clubbing SKIN:  No rashes no nodules NEURO:  Cranial nerves II through XII grossly intact, motor grossly intact throughout PSYCH:  Cognitively intact, oriented to person place and time  EKG:  Sinus rhythm, rate 74, axis within normal limits, intervals within normal limits, no acute ST-T wave changes.  ASSESSMENT AND PLAN  CHEST PAIN:  This is consistent with resting chest pain.  This is consistent with unstable anigna.  He has significant risk factors.  The pretest probability of obstructive coronary disease is very high. Given this cardiac catheterization is indicated.  According to appropriate use criteria with these new onset/unstable symptoms cardiac catheterization is the indicated test.  The patient  understands that risks included but are not limited to stroke (1 in 1000), death (1 in 1000), kidney failure [usually temporary] (1 in 500), bleeding (1 in 200), allergic reaction [possibly serious] (1 in 200).  The patient understands and agrees to proceed.  He is gong to start ASA and a low dose of beta blocker.   HTN:  His blood pressure is upper limits of normal.   We will concentrate on therapeutic lifestyle changes to include weight loss.   HYPERLIPIDEMIA:  He says he has not had this.  I will treat this based on findings at the time of cath.       

## 2013-03-15 NOTE — Progress Notes (Signed)
TOOK 2 BENADRYL LAST NIGHT AND TOOK 2 BENADRYL THIS MORNING

## 2013-03-15 NOTE — Interval H&P Note (Signed)
Cath Lab Visit (complete for each Cath Lab visit)  Clinical Evaluation Leading to the Procedure:   ACS: no  Non-ACS:    Anginal Classification: CCS IV  Anti-ischemic medical therapy: Minimal Therapy (1 class of medications)  Non-Invasive Test Results: No non-invasive testing performed  Prior CABG: No previous CABG      History and Physical Interval Note:  03/15/2013 10:30 AM  Justin Adams  has presented today for surgery, with the diagnosis of ANGINA  The various methods of treatment have been discussed with the patient and family. After consideration of risks, benefits and other options for treatment, the patient has consented to  Procedure(s): LEFT HEART CATHETERIZATION WITH CORONARY ANGIOGRAM (N/A) as a surgical intervention .  The patient's history has been reviewed, patient examined, no change in status, stable for surgery.  I have reviewed the patient's chart and labs.  Questions were answered to the patient's satisfaction.     Rollene Rotunda

## 2013-03-15 NOTE — CV Procedure (Signed)
  Cardiac Catheterization Procedure Note  Name: Justin Adams MRN: [id number] DOB: 03/14/1946  Procedure: Left Heart Cath, Selective Coronary Angiography, LV angiography  Indication:    Procedural details: The right radial was prepped, draped, and anesthetized with 1% lidocaine. Using modified Seldinger technique, a 5 French sheath was introduced into the right radial artery. Standard Judkins catheters were used for coronary angiography and left ventriculography. Catheter exchanges were performed over a guidewire. There were no immediate procedural complications. The patient was transferred to the post catheterization recovery area for further monitoring.  Procedural Findings:  Hemodynamics:     AO 137/72    LV 135/3   Coronary angiography:   Coronary dominance: Right  Left mainstem:   Normal  Left anterior descending (LAD):   LAD 25% proximal.  Large vessel wrapping the apex.  Diffuse mild luminal irregularities.  D1 small with mid 80% stenosis.  D2 very small with mild diffuse luminal irregularities.    Left circumflex (LCx):  Large branching RI.  There is a small proximal branch off of the RI that has moderate diffuse disease.  Mid 30% and distal 30% stenosis.  AV groove mild luminal irregularities.  MOM is a large branching vessel with long proximal 30% stenosis in the inferior branch.  PL large and normal  Right coronary artery (RCA):  Large vessel. Normal.  Large AM normal.  PDA moderate sized and normal.  Left ventriculography: Left ventricular systolic function is normal, LVEF is estimated at 65%, there is no significant mitral regurgitation   Final Conclusions:  Small vessel and nonobstructive large vessel disease.  NL LV  Recommendations: Medical management.   Rollene Rotunda 03/15/2013, 11:28 AM

## 2013-03-29 DIAGNOSIS — Z23 Encounter for immunization: Secondary | ICD-10-CM | POA: Diagnosis not present

## 2013-06-10 HISTORY — PX: HEMORRHOID BANDING: SHX5850

## 2013-07-06 DIAGNOSIS — H25049 Posterior subcapsular polar age-related cataract, unspecified eye: Secondary | ICD-10-CM | POA: Diagnosis not present

## 2013-07-06 DIAGNOSIS — H2510 Age-related nuclear cataract, unspecified eye: Secondary | ICD-10-CM | POA: Diagnosis not present

## 2013-07-06 DIAGNOSIS — H33309 Unspecified retinal break, unspecified eye: Secondary | ICD-10-CM | POA: Diagnosis not present

## 2013-07-06 DIAGNOSIS — H3530 Unspecified macular degeneration: Secondary | ICD-10-CM | POA: Diagnosis not present

## 2013-07-08 DIAGNOSIS — H33309 Unspecified retinal break, unspecified eye: Secondary | ICD-10-CM | POA: Diagnosis not present

## 2013-07-15 DIAGNOSIS — H33309 Unspecified retinal break, unspecified eye: Secondary | ICD-10-CM | POA: Diagnosis not present

## 2013-07-30 DIAGNOSIS — E782 Mixed hyperlipidemia: Secondary | ICD-10-CM | POA: Diagnosis not present

## 2013-07-30 DIAGNOSIS — Z79899 Other long term (current) drug therapy: Secondary | ICD-10-CM | POA: Diagnosis not present

## 2013-07-30 DIAGNOSIS — Z0000 Encounter for general adult medical examination without abnormal findings: Secondary | ICD-10-CM | POA: Diagnosis not present

## 2013-07-30 DIAGNOSIS — Z1331 Encounter for screening for depression: Secondary | ICD-10-CM | POA: Diagnosis not present

## 2013-07-30 DIAGNOSIS — Z23 Encounter for immunization: Secondary | ICD-10-CM | POA: Diagnosis not present

## 2013-08-18 DIAGNOSIS — H25049 Posterior subcapsular polar age-related cataract, unspecified eye: Secondary | ICD-10-CM | POA: Diagnosis not present

## 2013-08-18 DIAGNOSIS — H2510 Age-related nuclear cataract, unspecified eye: Secondary | ICD-10-CM | POA: Diagnosis not present

## 2013-08-18 DIAGNOSIS — H2589 Other age-related cataract: Secondary | ICD-10-CM | POA: Diagnosis not present

## 2013-08-19 DIAGNOSIS — H33309 Unspecified retinal break, unspecified eye: Secondary | ICD-10-CM | POA: Diagnosis not present

## 2013-12-20 DIAGNOSIS — H2510 Age-related nuclear cataract, unspecified eye: Secondary | ICD-10-CM | POA: Diagnosis not present

## 2013-12-20 DIAGNOSIS — H33309 Unspecified retinal break, unspecified eye: Secondary | ICD-10-CM | POA: Diagnosis not present

## 2013-12-20 DIAGNOSIS — H25049 Posterior subcapsular polar age-related cataract, unspecified eye: Secondary | ICD-10-CM | POA: Diagnosis not present

## 2014-01-26 DIAGNOSIS — H25049 Posterior subcapsular polar age-related cataract, unspecified eye: Secondary | ICD-10-CM | POA: Diagnosis not present

## 2014-01-26 DIAGNOSIS — H2589 Other age-related cataract: Secondary | ICD-10-CM | POA: Diagnosis not present

## 2014-01-26 DIAGNOSIS — H3530 Unspecified macular degeneration: Secondary | ICD-10-CM | POA: Diagnosis not present

## 2014-01-26 DIAGNOSIS — H2510 Age-related nuclear cataract, unspecified eye: Secondary | ICD-10-CM | POA: Diagnosis not present

## 2014-02-24 DIAGNOSIS — H33309 Unspecified retinal break, unspecified eye: Secondary | ICD-10-CM | POA: Diagnosis not present

## 2014-03-01 DIAGNOSIS — H43819 Vitreous degeneration, unspecified eye: Secondary | ICD-10-CM | POA: Diagnosis not present

## 2014-03-01 DIAGNOSIS — H35369 Drusen (degenerative) of macula, unspecified eye: Secondary | ICD-10-CM | POA: Diagnosis not present

## 2014-03-01 DIAGNOSIS — H353190 Nonexudative age-related macular degeneration, unspecified eye, stage unspecified: Secondary | ICD-10-CM | POA: Diagnosis not present

## 2014-03-09 DIAGNOSIS — N393 Stress incontinence (female) (male): Secondary | ICD-10-CM | POA: Diagnosis not present

## 2014-03-09 DIAGNOSIS — C61 Malignant neoplasm of prostate: Secondary | ICD-10-CM | POA: Diagnosis not present

## 2014-03-09 DIAGNOSIS — E559 Vitamin D deficiency, unspecified: Secondary | ICD-10-CM | POA: Diagnosis not present

## 2014-03-14 DIAGNOSIS — Z23 Encounter for immunization: Secondary | ICD-10-CM | POA: Diagnosis not present

## 2014-03-14 DIAGNOSIS — K641 Second degree hemorrhoids: Secondary | ICD-10-CM | POA: Diagnosis not present

## 2014-03-14 DIAGNOSIS — E6609 Other obesity due to excess calories: Secondary | ICD-10-CM | POA: Diagnosis not present

## 2014-03-16 DIAGNOSIS — N135 Crossing vessel and stricture of ureter without hydronephrosis: Secondary | ICD-10-CM | POA: Diagnosis not present

## 2014-03-16 DIAGNOSIS — C61 Malignant neoplasm of prostate: Secondary | ICD-10-CM | POA: Diagnosis not present

## 2014-03-16 DIAGNOSIS — N1330 Unspecified hydronephrosis: Secondary | ICD-10-CM | POA: Diagnosis not present

## 2014-03-23 DIAGNOSIS — K641 Second degree hemorrhoids: Secondary | ICD-10-CM | POA: Diagnosis not present

## 2014-03-23 DIAGNOSIS — K625 Hemorrhage of anus and rectum: Secondary | ICD-10-CM | POA: Diagnosis not present

## 2014-04-20 DIAGNOSIS — K641 Second degree hemorrhoids: Secondary | ICD-10-CM | POA: Diagnosis not present

## 2014-05-11 DIAGNOSIS — K641 Second degree hemorrhoids: Secondary | ICD-10-CM | POA: Diagnosis not present

## 2014-05-19 ENCOUNTER — Encounter (HOSPITAL_COMMUNITY): Payer: Self-pay | Admitting: Cardiology

## 2014-05-30 DIAGNOSIS — J029 Acute pharyngitis, unspecified: Secondary | ICD-10-CM | POA: Diagnosis not present

## 2014-08-03 DIAGNOSIS — C61 Malignant neoplasm of prostate: Secondary | ICD-10-CM | POA: Diagnosis not present

## 2014-08-03 DIAGNOSIS — Z79899 Other long term (current) drug therapy: Secondary | ICD-10-CM | POA: Diagnosis not present

## 2014-08-03 DIAGNOSIS — D751 Secondary polycythemia: Secondary | ICD-10-CM | POA: Diagnosis not present

## 2014-08-03 DIAGNOSIS — E669 Obesity, unspecified: Secondary | ICD-10-CM | POA: Diagnosis not present

## 2014-08-03 DIAGNOSIS — Z1389 Encounter for screening for other disorder: Secondary | ICD-10-CM | POA: Diagnosis not present

## 2014-08-03 DIAGNOSIS — N200 Calculus of kidney: Secondary | ICD-10-CM | POA: Diagnosis not present

## 2014-08-03 DIAGNOSIS — Z0000 Encounter for general adult medical examination without abnormal findings: Secondary | ICD-10-CM | POA: Diagnosis not present

## 2014-08-03 DIAGNOSIS — Z6833 Body mass index (BMI) 33.0-33.9, adult: Secondary | ICD-10-CM | POA: Diagnosis not present

## 2014-08-03 DIAGNOSIS — H3530 Unspecified macular degeneration: Secondary | ICD-10-CM | POA: Diagnosis not present

## 2014-08-03 DIAGNOSIS — E780 Pure hypercholesterolemia: Secondary | ICD-10-CM | POA: Diagnosis not present

## 2014-09-30 DIAGNOSIS — H3531 Nonexudative age-related macular degeneration: Secondary | ICD-10-CM | POA: Diagnosis not present

## 2014-09-30 DIAGNOSIS — H33302 Unspecified retinal break, left eye: Secondary | ICD-10-CM | POA: Diagnosis not present

## 2014-09-30 DIAGNOSIS — H43813 Vitreous degeneration, bilateral: Secondary | ICD-10-CM | POA: Diagnosis not present

## 2014-09-30 DIAGNOSIS — Z961 Presence of intraocular lens: Secondary | ICD-10-CM | POA: Diagnosis not present

## 2015-02-28 DIAGNOSIS — H3531 Nonexudative age-related macular degeneration: Secondary | ICD-10-CM | POA: Diagnosis not present

## 2015-02-28 DIAGNOSIS — H33312 Horseshoe tear of retina without detachment, left eye: Secondary | ICD-10-CM | POA: Diagnosis not present

## 2015-02-28 DIAGNOSIS — H35431 Paving stone degeneration of retina, right eye: Secondary | ICD-10-CM | POA: Diagnosis not present

## 2015-03-08 DIAGNOSIS — H33312 Horseshoe tear of retina without detachment, left eye: Secondary | ICD-10-CM | POA: Diagnosis not present

## 2015-03-20 DIAGNOSIS — R3121 Asymptomatic microscopic hematuria: Secondary | ICD-10-CM | POA: Diagnosis not present

## 2015-03-20 DIAGNOSIS — N135 Crossing vessel and stricture of ureter without hydronephrosis: Secondary | ICD-10-CM | POA: Diagnosis not present

## 2015-03-20 DIAGNOSIS — C61 Malignant neoplasm of prostate: Secondary | ICD-10-CM | POA: Diagnosis not present

## 2015-03-20 DIAGNOSIS — R3915 Urgency of urination: Secondary | ICD-10-CM | POA: Diagnosis not present

## 2015-03-20 DIAGNOSIS — N200 Calculus of kidney: Secondary | ICD-10-CM | POA: Diagnosis not present

## 2015-03-20 DIAGNOSIS — E559 Vitamin D deficiency, unspecified: Secondary | ICD-10-CM | POA: Diagnosis not present

## 2015-03-20 DIAGNOSIS — R3129 Other microscopic hematuria: Secondary | ICD-10-CM | POA: Diagnosis not present

## 2015-03-29 DIAGNOSIS — M5136 Other intervertebral disc degeneration, lumbar region: Secondary | ICD-10-CM | POA: Diagnosis not present

## 2015-04-07 DIAGNOSIS — M5136 Other intervertebral disc degeneration, lumbar region: Secondary | ICD-10-CM | POA: Diagnosis not present

## 2015-04-11 DIAGNOSIS — M5136 Other intervertebral disc degeneration, lumbar region: Secondary | ICD-10-CM | POA: Diagnosis not present

## 2015-05-01 DIAGNOSIS — M5136 Other intervertebral disc degeneration, lumbar region: Secondary | ICD-10-CM | POA: Diagnosis not present

## 2015-05-30 DIAGNOSIS — Z23 Encounter for immunization: Secondary | ICD-10-CM | POA: Diagnosis not present

## 2015-07-06 DIAGNOSIS — H33312 Horseshoe tear of retina without detachment, left eye: Secondary | ICD-10-CM | POA: Diagnosis not present

## 2015-09-25 DIAGNOSIS — Z0000 Encounter for general adult medical examination without abnormal findings: Secondary | ICD-10-CM | POA: Diagnosis not present

## 2015-09-25 DIAGNOSIS — E669 Obesity, unspecified: Secondary | ICD-10-CM | POA: Diagnosis not present

## 2015-09-25 DIAGNOSIS — Z1389 Encounter for screening for other disorder: Secondary | ICD-10-CM | POA: Diagnosis not present

## 2015-09-25 DIAGNOSIS — E7800 Pure hypercholesterolemia, unspecified: Secondary | ICD-10-CM | POA: Diagnosis not present

## 2015-09-25 DIAGNOSIS — Z79899 Other long term (current) drug therapy: Secondary | ICD-10-CM | POA: Diagnosis not present

## 2015-09-25 DIAGNOSIS — Z6835 Body mass index (BMI) 35.0-35.9, adult: Secondary | ICD-10-CM | POA: Diagnosis not present

## 2015-11-27 DIAGNOSIS — H43813 Vitreous degeneration, bilateral: Secondary | ICD-10-CM | POA: Diagnosis not present

## 2015-11-27 DIAGNOSIS — H26493 Other secondary cataract, bilateral: Secondary | ICD-10-CM | POA: Diagnosis not present

## 2015-11-27 DIAGNOSIS — H52203 Unspecified astigmatism, bilateral: Secondary | ICD-10-CM | POA: Diagnosis not present

## 2015-11-27 DIAGNOSIS — Z961 Presence of intraocular lens: Secondary | ICD-10-CM | POA: Diagnosis not present

## 2016-01-24 DIAGNOSIS — E7800 Pure hypercholesterolemia, unspecified: Secondary | ICD-10-CM | POA: Diagnosis not present

## 2016-03-28 DIAGNOSIS — N200 Calculus of kidney: Secondary | ICD-10-CM | POA: Diagnosis not present

## 2016-04-02 DIAGNOSIS — R3121 Asymptomatic microscopic hematuria: Secondary | ICD-10-CM | POA: Diagnosis not present

## 2016-04-02 DIAGNOSIS — N135 Crossing vessel and stricture of ureter without hydronephrosis: Secondary | ICD-10-CM | POA: Diagnosis not present

## 2016-04-02 DIAGNOSIS — N200 Calculus of kidney: Secondary | ICD-10-CM | POA: Diagnosis not present

## 2016-04-02 DIAGNOSIS — N393 Stress incontinence (female) (male): Secondary | ICD-10-CM | POA: Diagnosis not present

## 2016-04-02 DIAGNOSIS — C61 Malignant neoplasm of prostate: Secondary | ICD-10-CM | POA: Diagnosis not present

## 2016-04-05 DIAGNOSIS — Z23 Encounter for immunization: Secondary | ICD-10-CM | POA: Diagnosis not present

## 2016-05-16 DIAGNOSIS — D225 Melanocytic nevi of trunk: Secondary | ICD-10-CM | POA: Diagnosis not present

## 2016-05-16 DIAGNOSIS — D1801 Hemangioma of skin and subcutaneous tissue: Secondary | ICD-10-CM | POA: Diagnosis not present

## 2016-05-16 DIAGNOSIS — L821 Other seborrheic keratosis: Secondary | ICD-10-CM | POA: Diagnosis not present

## 2016-05-16 DIAGNOSIS — L918 Other hypertrophic disorders of the skin: Secondary | ICD-10-CM | POA: Diagnosis not present

## 2016-05-16 DIAGNOSIS — D485 Neoplasm of uncertain behavior of skin: Secondary | ICD-10-CM | POA: Diagnosis not present

## 2016-07-23 DIAGNOSIS — H26491 Other secondary cataract, right eye: Secondary | ICD-10-CM | POA: Diagnosis not present

## 2016-07-23 DIAGNOSIS — H35431 Paving stone degeneration of retina, right eye: Secondary | ICD-10-CM | POA: Diagnosis not present

## 2016-07-23 DIAGNOSIS — H26492 Other secondary cataract, left eye: Secondary | ICD-10-CM | POA: Diagnosis not present

## 2016-07-23 DIAGNOSIS — H33312 Horseshoe tear of retina without detachment, left eye: Secondary | ICD-10-CM | POA: Diagnosis not present

## 2016-09-25 DIAGNOSIS — H353132 Nonexudative age-related macular degeneration, bilateral, intermediate dry stage: Secondary | ICD-10-CM | POA: Diagnosis not present

## 2016-09-25 DIAGNOSIS — H26493 Other secondary cataract, bilateral: Secondary | ICD-10-CM | POA: Diagnosis not present

## 2016-09-25 DIAGNOSIS — H524 Presbyopia: Secondary | ICD-10-CM | POA: Diagnosis not present

## 2016-09-25 DIAGNOSIS — H43813 Vitreous degeneration, bilateral: Secondary | ICD-10-CM | POA: Diagnosis not present

## 2016-11-21 DIAGNOSIS — H26492 Other secondary cataract, left eye: Secondary | ICD-10-CM | POA: Diagnosis not present

## 2016-12-17 DIAGNOSIS — Z79899 Other long term (current) drug therapy: Secondary | ICD-10-CM | POA: Diagnosis not present

## 2016-12-17 DIAGNOSIS — Z1389 Encounter for screening for other disorder: Secondary | ICD-10-CM | POA: Diagnosis not present

## 2016-12-17 DIAGNOSIS — E7800 Pure hypercholesterolemia, unspecified: Secondary | ICD-10-CM | POA: Diagnosis not present

## 2016-12-17 DIAGNOSIS — E669 Obesity, unspecified: Secondary | ICD-10-CM | POA: Diagnosis not present

## 2016-12-17 DIAGNOSIS — D751 Secondary polycythemia: Secondary | ICD-10-CM | POA: Diagnosis not present

## 2016-12-17 DIAGNOSIS — Z6833 Body mass index (BMI) 33.0-33.9, adult: Secondary | ICD-10-CM | POA: Diagnosis not present

## 2016-12-17 DIAGNOSIS — Z0000 Encounter for general adult medical examination without abnormal findings: Secondary | ICD-10-CM | POA: Diagnosis not present

## 2016-12-17 DIAGNOSIS — M5432 Sciatica, left side: Secondary | ICD-10-CM | POA: Diagnosis not present

## 2017-01-02 DIAGNOSIS — H26491 Other secondary cataract, right eye: Secondary | ICD-10-CM | POA: Diagnosis not present

## 2017-04-02 DIAGNOSIS — Z23 Encounter for immunization: Secondary | ICD-10-CM | POA: Diagnosis not present

## 2017-04-29 DIAGNOSIS — E559 Vitamin D deficiency, unspecified: Secondary | ICD-10-CM | POA: Diagnosis not present

## 2017-05-21 DIAGNOSIS — N200 Calculus of kidney: Secondary | ICD-10-CM | POA: Diagnosis not present

## 2017-05-21 DIAGNOSIS — C61 Malignant neoplasm of prostate: Secondary | ICD-10-CM | POA: Diagnosis not present

## 2017-05-21 DIAGNOSIS — N393 Stress incontinence (female) (male): Secondary | ICD-10-CM | POA: Diagnosis not present

## 2017-05-21 DIAGNOSIS — R3121 Asymptomatic microscopic hematuria: Secondary | ICD-10-CM | POA: Diagnosis not present

## 2017-06-10 DIAGNOSIS — I639 Cerebral infarction, unspecified: Secondary | ICD-10-CM

## 2017-06-10 HISTORY — DX: Cerebral infarction, unspecified: I63.9

## 2017-07-09 ENCOUNTER — Ambulatory Visit
Admission: RE | Admit: 2017-07-09 | Discharge: 2017-07-09 | Disposition: A | Discharge status: Home / Self Care | Payer: Medicare Other | Source: Ambulatory Visit | Attending: Geriatric Medicine | Admitting: Geriatric Medicine

## 2017-07-09 ENCOUNTER — Inpatient Hospital Stay (HOSPITAL_COMMUNITY)
Admission: EM | Admit: 2017-07-09 | Discharge: 2017-07-14 | DRG: 064 | Disposition: A | Discharge status: Home / Self Care | Payer: Medicare Other | Attending: Neurology | Admitting: Neurology

## 2017-07-09 ENCOUNTER — Other Ambulatory Visit: Payer: Self-pay

## 2017-07-09 ENCOUNTER — Other Ambulatory Visit: Payer: Self-pay | Admitting: Geriatric Medicine

## 2017-07-09 ENCOUNTER — Encounter (HOSPITAL_COMMUNITY): Payer: Self-pay | Admitting: *Deleted

## 2017-07-09 DIAGNOSIS — Z8249 Family history of ischemic heart disease and other diseases of the circulatory system: Secondary | ICD-10-CM

## 2017-07-09 DIAGNOSIS — Z91041 Radiographic dye allergy status: Secondary | ICD-10-CM

## 2017-07-09 DIAGNOSIS — G08 Intracranial and intraspinal phlebitis and thrombophlebitis: Secondary | ICD-10-CM

## 2017-07-09 DIAGNOSIS — Z7982 Long term (current) use of aspirin: Secondary | ICD-10-CM

## 2017-07-09 DIAGNOSIS — I10 Essential (primary) hypertension: Secondary | ICD-10-CM | POA: Diagnosis not present

## 2017-07-09 DIAGNOSIS — I611 Nontraumatic intracerebral hemorrhage in hemisphere, cortical: Principal | ICD-10-CM | POA: Diagnosis present

## 2017-07-09 DIAGNOSIS — I676 Nonpyogenic thrombosis of intracranial venous system: Secondary | ICD-10-CM | POA: Diagnosis present

## 2017-07-09 DIAGNOSIS — E860 Dehydration: Secondary | ICD-10-CM | POA: Diagnosis present

## 2017-07-09 DIAGNOSIS — I609 Nontraumatic subarachnoid hemorrhage, unspecified: Secondary | ICD-10-CM | POA: Diagnosis present

## 2017-07-09 DIAGNOSIS — E669 Obesity, unspecified: Secondary | ICD-10-CM | POA: Diagnosis present

## 2017-07-09 DIAGNOSIS — G936 Cerebral edema: Secondary | ICD-10-CM | POA: Diagnosis present

## 2017-07-09 DIAGNOSIS — G4489 Other headache syndrome: Secondary | ICD-10-CM | POA: Diagnosis not present

## 2017-07-09 DIAGNOSIS — Z6832 Body mass index (BMI) 32.0-32.9, adult: Secondary | ICD-10-CM

## 2017-07-09 DIAGNOSIS — R402362 Coma scale, best motor response, obeys commands, at arrival to emergency department: Secondary | ICD-10-CM | POA: Diagnosis present

## 2017-07-09 DIAGNOSIS — R402142 Coma scale, eyes open, spontaneous, at arrival to emergency department: Secondary | ICD-10-CM | POA: Diagnosis present

## 2017-07-09 DIAGNOSIS — E7800 Pure hypercholesterolemia, unspecified: Secondary | ICD-10-CM | POA: Diagnosis present

## 2017-07-09 DIAGNOSIS — I619 Nontraumatic intracerebral hemorrhage, unspecified: Secondary | ICD-10-CM

## 2017-07-09 DIAGNOSIS — I612 Nontraumatic intracerebral hemorrhage in hemisphere, unspecified: Secondary | ICD-10-CM | POA: Diagnosis not present

## 2017-07-09 DIAGNOSIS — I639 Cerebral infarction, unspecified: Secondary | ICD-10-CM

## 2017-07-09 DIAGNOSIS — G4452 New daily persistent headache (NDPH): Secondary | ICD-10-CM

## 2017-07-09 DIAGNOSIS — I63319 Cerebral infarction due to thrombosis of unspecified middle cerebral artery: Secondary | ICD-10-CM

## 2017-07-09 DIAGNOSIS — I618 Other nontraumatic intracerebral hemorrhage: Secondary | ICD-10-CM | POA: Diagnosis not present

## 2017-07-09 DIAGNOSIS — R52 Pain, unspecified: Secondary | ICD-10-CM | POA: Diagnosis not present

## 2017-07-09 DIAGNOSIS — Z8546 Personal history of malignant neoplasm of prostate: Secondary | ICD-10-CM

## 2017-07-09 DIAGNOSIS — R402252 Coma scale, best verbal response, oriented, at arrival to emergency department: Secondary | ICD-10-CM | POA: Diagnosis present

## 2017-07-09 DIAGNOSIS — D751 Secondary polycythemia: Secondary | ICD-10-CM | POA: Diagnosis present

## 2017-07-09 DIAGNOSIS — E876 Hypokalemia: Secondary | ICD-10-CM | POA: Diagnosis not present

## 2017-07-09 DIAGNOSIS — E785 Hyperlipidemia, unspecified: Secondary | ICD-10-CM | POA: Diagnosis present

## 2017-07-09 DIAGNOSIS — R29700 NIHSS score 0: Secondary | ICD-10-CM | POA: Diagnosis present

## 2017-07-09 DIAGNOSIS — R51 Headache: Secondary | ICD-10-CM | POA: Diagnosis not present

## 2017-07-09 DIAGNOSIS — I6200 Nontraumatic subdural hemorrhage, unspecified: Secondary | ICD-10-CM | POA: Diagnosis not present

## 2017-07-09 DIAGNOSIS — G4733 Obstructive sleep apnea (adult) (pediatric): Secondary | ICD-10-CM | POA: Diagnosis present

## 2017-07-09 LAB — CBC WITH DIFFERENTIAL/PLATELET
Basophils Absolute: 0 10*3/uL (ref 0.0–0.1)
Basophils Relative: 0 %
Eosinophils Absolute: 0 10*3/uL (ref 0.0–0.7)
Eosinophils Relative: 0 %
HEMATOCRIT: 51.6 % (ref 39.0–52.0)
Hemoglobin: 17.5 g/dL — ABNORMAL HIGH (ref 13.0–17.0)
LYMPHS ABS: 1 10*3/uL (ref 0.7–4.0)
Lymphocytes Relative: 10 %
MCH: 29.3 pg (ref 26.0–34.0)
MCHC: 33.9 g/dL (ref 30.0–36.0)
MCV: 86.4 fL (ref 78.0–100.0)
MONO ABS: 0.4 10*3/uL (ref 0.1–1.0)
MONOS PCT: 4 %
Neutro Abs: 8.4 10*3/uL — ABNORMAL HIGH (ref 1.7–7.7)
Neutrophils Relative %: 86 %
Platelets: 209 10*3/uL (ref 150–400)
RBC: 5.97 MIL/uL — ABNORMAL HIGH (ref 4.22–5.81)
RDW: 13.6 % (ref 11.5–15.5)
WBC: 9.8 10*3/uL (ref 4.0–10.5)

## 2017-07-09 LAB — BASIC METABOLIC PANEL
ANION GAP: 13 (ref 5–15)
BUN: 15 mg/dL (ref 6–20)
CO2: 22 mmol/L (ref 22–32)
CREATININE: 0.84 mg/dL (ref 0.61–1.24)
Calcium: 8.9 mg/dL (ref 8.9–10.3)
Chloride: 103 mmol/L (ref 101–111)
GFR calc Af Amer: >60 mL/min (ref >60)
GFR calc non Af Amer: >60 mL/min (ref >60)
GLUCOSE: 134 mg/dL — AB (ref 65–99)
Potassium: 4.1 mmol/L (ref 3.5–5.1)
Sodium: 138 mmol/L (ref 135–145)

## 2017-07-09 LAB — APTT: APTT: 27 s (ref 24–36)

## 2017-07-09 LAB — PROTIME-INR
INR: 1.07
Prothrombin Time: 13.9 seconds (ref 11.4–15.2)

## 2017-07-09 MED ORDER — MORPHINE SULFATE (PF) 4 MG/ML IV SOLN
4.0000 mg | Freq: Once | INTRAVENOUS | Status: AC
  Administered 2017-07-09: 4 mg via INTRAVENOUS
  Filled 2017-07-09: qty 1

## 2017-07-09 MED ORDER — NICARDIPINE HCL IN NACL 20-0.86 MG/200ML-% IV SOLN
0.0000 mg/h | INTRAVENOUS | Status: DC
  Administered 2017-07-09: 5 mg/h via INTRAVENOUS
  Filled 2017-07-09: qty 200

## 2017-07-09 MED ORDER — CLEVIDIPINE BUTYRATE 0.5 MG/ML IV EMUL
0.0000 mg/h | INTRAVENOUS | Status: DC
  Administered 2017-07-09: 1 mg/h via INTRAVENOUS
  Administered 2017-07-10: 7 mg/h via INTRAVENOUS
  Administered 2017-07-10 (×2): 8 mg/h via INTRAVENOUS
  Filled 2017-07-09 (×9): qty 50

## 2017-07-09 NOTE — ED Notes (Signed)
The pts bp 150/79 is lower from the ms given aprox 5 minutes ago cardene has not had time to  Lower his bp

## 2017-07-09 NOTE — ED Provider Notes (Signed)
[facility] DEPARTMENT Provider Note   CSN: [id number] Arrival date & time: 07/09/17  2024     History   Chief Complaint Chief Complaint  Patient presents with  . Headache    HPI Justin Adams is a 72 y.o. male.  HPI The patient presents to the emergency room for evaluation of a headache.  Patient states he has had some intermittent episodes of headaches off and on for the last couple weeks but yesterday morning he woke up with a rather severe headache.  He has had some nausea.  He denies any troubles though with his vision or his speech.  He has not had any numbness or weakness.  He denies any significant neck pain.  No fevers or chills.  He went to see his primary care doctor yesterday and he had an outpatient MRI.  Patient states that he was told he had some type of hemorrhage on the MRI and was directed to come to the emergency room. Past Medical History:  Diagnosis Date  . Adenomatous polyp   . Cancer [facility]) 2003   prostate  . Hemorrhoids   . Hypercholesteremia   . Hypertension   . Muscular degeneration   . Nephrolithiasis   . Obesity   . Polycythemia   . Shingles   . Sleep apnea     Patient Active Problem List   Diagnosis Date Noted  . Unstable angina ([place]) 03/12/2013    Past Surgical History:  Procedure Laterality Date  . CHOLECYSTECTOMY    . LEFT HEART CATHETERIZATION WITH CORONARY ANGIOGRAM N/A 03/15/2013   Procedure: LEFT HEART CATHETERIZATION WITH CORONARY ANGIOGRAM;  Surgeon: Minus Breeding, MD;  Location: [facility] CATH LAB;  Service: Cardiovascular;  Laterality: N/A;  . PROSTATECTOMY    . STONE EXTRACTION WITH BASKET         Home Medications    Prior to Admission medications   Medication Sig Start Date End Date Taking? Authorizing Provider  aspirin EC 81 MG tablet Take 1 tablet (81 mg total) by mouth daily. 03/11/13   Minus Breeding, MD  cholecalciferol (VITAMIN D) 1000 UNITS tablet Take 1,000 Units by mouth daily.     [provider]  Multiple Vitamins-Minerals (ICAPS PO) Take 1 capsule by mouth 2 (two) times daily.    [provider]  predniSONE (DELTASONE) 20 MG tablet Take 3 tablets at 8 pm PM prior to your cath and 3 tablets 6 am the morning 03/12/13   Minus Breeding, MD  Sod Citrate-Citric Acid (CYTRA-2 PO) Take 60 mLs by mouth 2 (two) times daily.     [provider]    Family History Family History  Problem Relation Age of Onset  . Hypertension Mother   . Coronary artery disease Mother 76  . Breast cancer Mother   . Coronary artery disease Father 77       Died age 51  . CAD Brother 60    Social History Social History   Tobacco Use  . Smoking status: Never Smoker  . Smokeless tobacco: Never Used  Substance Use Topics  . Alcohol use: No    Frequency: Never  . Drug use: Not on file     Allergies   Iodinated diagnostic agents   Review of Systems Review of Systems  All other systems reviewed and are negative.    Physical Exam Updated Vital Signs BP (!) 148/72   Pulse 93   Resp 20   Ht 1.905 m (6\' 3" )  Wt 119.3 kg (263 lb)   SpO2 94%   BMI 32.87 kg/m   Physical Exam  Constitutional: He appears well-developed and well-nourished. No distress.  HENT:  Head: Normocephalic and atraumatic.  Right Ear: External ear normal.  Left Ear: External ear normal.  Eyes: Conjunctivae are normal. Right eye exhibits no discharge. Left eye exhibits no discharge. No scleral icterus.  Neck: Neck supple. No tracheal deviation present.  Cardiovascular: Normal rate, regular rhythm and intact distal pulses.  Pulmonary/Chest: Effort normal and breath sounds normal. No stridor. No respiratory distress. He has no wheezes. He has no rales.  Abdominal: Soft. Bowel sounds are normal. He exhibits no distension. There is no tenderness. There is no rebound and no guarding.  Musculoskeletal: He exhibits no edema or tenderness.  Neurological: He is alert. He has normal  strength. No cranial nerve deficit (no facial droop, extraocular movements intact, no slurred speech) or sensory deficit. He exhibits normal muscle tone. He displays no seizure activity. Coordination normal.  Skin: Skin is warm and dry. No rash noted.  Psychiatric: He has a normal mood and affect.  Nursing note and vitals reviewed.    ED Treatments / Results  Labs (all labs ordered are listed, but only abnormal results are displayed) Labs Reviewed  CBC WITH DIFFERENTIAL/PLATELET - Abnormal; Notable for the following components:      Result Value   RBC 5.97 (*)    Hemoglobin 17.5 (*)    Neutro Abs 8.4 (*)    All other components within normal limits  BASIC METABOLIC PANEL - Abnormal; Notable for the following components:   Glucose, Bld 134 (*)    All other components within normal limits  PROTIME-INR  APTT    EKG  EKG Interpretation  Date/Time:  Wednesday July 09 2017 20:28:39 EST Ventricular Rate:  102 PR Interval:    QRS Duration: 86 QT Interval:  338 QTC Calculation: 443 R Axis:   42 Text Interpretation:  Sinus tachycardia Probable left atrial enlargement No significant change since last tracing Confirmed by Dorie Rank [phone]) on 07/09/2017 8:30:35 PM       Radiology Mr Jeri Cos Wo Contrast  Result Date: 07/09/2017 CLINICAL DATA:  Initial evaluation for persistent headaches for past 2 weeks EXAM: MRI HEAD WITHOUT AND WITH CONTRAST TECHNIQUE: Multiplanar, multiecho pulse sequences of the brain and surrounding structures were obtained without and with intravenous contrast. CONTRAST:  20 cc of MultiHance. COMPARISON:  None available. FINDINGS: Brain: Study moderately degraded by motion artifact. Generalized age-related cerebral atrophy. Patchy T2/FLAIR hyperintensity within the periventricular and deep white matter both cerebral hemispheres, most consistent with chronic small vessel ischemic disease, mild in nature. There is an acute to subacute appearing intraparenchymal  hematoma centered at the peripheral right temporal lobe measuring approximately 6.1 x 3.1 x 3.8 cm (series 8, image 11). Surrounding rim of vasogenic edema with mild regional mass effect. Associated small volume subarachnoid hemorrhage within the adjacent right frontotemporal region, evident on SWI sequence (series 10, image 52). Additionally, evidence for extra-axial extension with a in 3 mm right holo hemispheric subdural hematoma. Trace layering blood products within the occipital horns of both lateral ventricles, suspected to be related to redistribution (series 10, image 33). Mild mass effect on the right lateral ventricle which is partially attenuated. Trace 3 mm right-to-left shift. No hydrocephalus or ventricular trapping. Basilar cisterns are patent. Scattered leptomeningeal enhancement within the right frontotemporal region likely reactive. No discernible mass lesion seen underlying the intraparenchymal hematoma. Abnormal flow void within  the right transverse and sigmoid sinus, suspicious for possible venous sinus thrombosis (series 7, image 8). No evidence for acute infarct. Gray-white matter differentiation otherwise maintained. No other encephalomalacia to suggest chronic infarction. No other mass lesion. Pituitary gland within normal limits. Midline structures intact and normal. Vascular: Major arterial vascular flow voids maintained at the skull base. Skull and upper cervical spine: Craniocervical junction within normal limits. Upper cervical spine normal. Bone marrow signal intensity within normal limits. No scalp soft tissue abnormality. Sinuses/Orbits: Globes and orbital soft tissues within normal limits. Patient status post lens extraction bilaterally. Scattered mucosal thickening within the maxillary sinuses and ethmoidal air cells. Trace bilateral mastoid effusions noted. Other: None. IMPRESSION: 1. 6.1 x 3.1 x 3.8 cm acute to subacute intraparenchymal hematoma positioned within the anterior  right temporal lobe. Associated edema with regional mass effect with trace 3 mm right-to-left midline shift. 2. Associated small volume subarachnoid hemorrhage within the adjacent right frontotemporal region. Trace intraventricular blood products suspected to be related to redistribution. 3. Evidence for intra-axial extension with small 3 mm right holo hemispheric subdural hematoma. 4. Abnormal flow void within the right transverse and sigmoid sinuses, suspicious for venous sinus thrombosis. Findings suspected to be the underlying etiology for the intraparenchymal hemorrhage. 5. Mild chronic small vessel ischemic disease. Critical Value/emergent results were called by telephone at the time of interpretation on 07/09/2017 at 9:19 pm to Dr. Tomi Bamberger , who verbally acknowledged these results. Electronically Signed   By: Jeannine Boga M.D.   On: 07/09/2017 21:20    Procedures .Critical Care Performed by: Dorie Rank, MD Authorized by: Dorie Rank, MD   Critical care provider statement:    Critical care time (minutes):  45   Critical care was time spent personally by me on the following activities:  Discussions with consultants, evaluation of patient's response to treatment, examination of patient, ordering and performing treatments and interventions, ordering and review of laboratory studies, ordering and review of radiographic studies, pulse oximetry, re-evaluation of patient's condition, obtaining history from patient or surrogate and review of old charts   (including critical care time)  Medications Ordered in ED Medications  clevidipine (CLEVIPREX) infusion 0.5 mg/mL (not administered)  morphine 4 MG/ML injection 4 mg (4 mg Intravenous Given 07/09/17 2112)     Initial Impression / Assessment and Plan / ED Course  I have reviewed the triage vital signs and the nursing notes.  Pertinent labs & imaging results that were available during my care of the patient were reviewed by me and considered  in my medical decision making (see chart for details).  Clinical Course as of Jul 09 2213  Wed Jul 09, 2017  2050 Dr Ronnald Ramp, neurosurgery happened to be in the ED.  He reviewed the images.  Evaluated the patient in the ED as well.  Recommends neurology evaluation.  [JK]  2117 D/w radiology, Dr Raj Janus, intracerebral right temporal lobe 6 cm in size, small subarachnoid and subdural  [JK]  2131 D/w Dr Cheral Marker.  Requests change to clevidipine instead of cardene.  [JK]  2214 BP is now around 140.  Not currently on the clevidipine drip.   Will continue to monitor, resume if bp increases  [JK]    Clinical Course User Index [JK] Dorie Rank, MD    Patient presents to the emergency room with what appears to be an intracerebral hemorrhage.  Formal MRI reading is still pending.  Patient is neurologically stable.  He does complain of a headache and he is hypertensive.  I will start him on a Cardene drip.  I will consult with neurology.  I spoke with Dr. Cheral Marker.  Plan is to change to Cleviprex.  Blood pressure is now 268 systolic.  Will resume his drip if the blood pressure increases at all Final Clinical Impressions(s) / ED Diagnoses   Final diagnoses:  Nontraumatic hemorrhage of right cerebral hemisphere [facility])    ED Discharge Orders    None       Dorie Rank, MD 07/09/17 2215

## 2017-07-09 NOTE — ED Notes (Signed)
The pt moves all extremities alert oriented skin warm and dry  Pupils 2.0 equal and react  No gait difficulty  No speech difficulty

## 2017-07-09 NOTE — Consult Note (Signed)
NEURO HOSPITALIST CONSULT NOTE   Requestig physician: Dr. Tomi Bamberger  Reason for Consult: Right temporal lobe hemorrhage  History obtained from:  Patient, Family and Chart     HPI:                                                                                                                                          Justin Adams is an 72 y.o. male presenting from [facility] imaging center after an MRI there showed a subacute right temporal lobe hemorrhage in conjunction with a small amount of adjacent subdural and subarachnoid hemorrhage, as well as right transverse and sigmoid sinus thrombosis. He was imaged to evaluate a 2 week history of right frontal headache that suddenly worsened two days ago with associated nausea. He denies having any associated weakness, sensory numbness, confusion, aphasia, vision changes or neck pain. Also without any recent fevers or chills.   MRI obtained today reveals a subacute 6.1 x 3.1 x 3.8 cm intraparenchymal hematoma positioned within the anterior right temporal lobe with associated edema, regional mass effect and trace 3 mm right-to-left midline shift. There is a small volume subarachnoid hemorrhage within the adjacent right frontotemporal region. There are trace intraventricular blood products suspected to be related to redistribution. Small 3 mm right holohemispheric subdural hematoma is also noted. There is an abnormal flow void within the right transverse and sigmoid sinuses, appearing most consistent with acute/subacute venous sinus thrombosis, which is suspected to be the underlying etiology for the intraparenchymal hemorrhage (venous right temporal lobe stroke with hemorrhagic conversion).  PMHx includes prostate CA, hypercholesterolemia, HTN, polycythemia, shingles and sleep apnea.   Past Medical History:  Diagnosis Date  . Adenomatous polyp   . Cancer [facility]) 2003   prostate  . Hemorrhoids   . Hypercholesteremia   .  Hypertension   . Muscular degeneration   . Nephrolithiasis   . Obesity   . Polycythemia   . Shingles   . Sleep apnea     Past Surgical History:  Procedure Laterality Date  . CHOLECYSTECTOMY    . LEFT HEART CATHETERIZATION WITH CORONARY ANGIOGRAM N/A 03/15/2013   Procedure: LEFT HEART CATHETERIZATION WITH CORONARY ANGIOGRAM;  Surgeon: Minus Breeding, MD;  Location: [facility] CATH LAB;  Service: Cardiovascular;  Laterality: N/A;  . PROSTATECTOMY    . STONE EXTRACTION WITH BASKET      Family History  Problem Relation Age of Onset  . Hypertension Mother   . Coronary artery disease Mother 1  . Breast cancer Mother   . Coronary artery disease Father 85       Died age 22  . CAD Brother 54   Social History:  reports that  has never smoked. he has never used smokeless tobacco. He reports that he does not drink alcohol.  His drug history is not on file.  Allergies  Allergen Reactions  . Iodinated Diagnostic Agents Rash    MEDICATIONS:                                                                                                                     Current Meds  Medication Sig  . acetaminophen (TYLENOL) 500 MG tablet Take 500-1,000 mg by mouth every 6 (six) hours as needed (for headaches).  Marland Kitchen ibuprofen (ADVIL,MOTRIN) 200 MG tablet Take 400 mg by mouth every 6 (six) hours as needed (for headaches).  . Multiple Vitamins-Minerals (ICAPS PO) Take 1 capsule by mouth 2 (two) times daily.  . Pseudoeph-Doxylamine-DM-APAP (NYQUIL PO) Take 10-15 mLs by mouth every 6 (six) hours as needed (for symptoms).  . Sod Citrate-Citric Acid (CYTRA-2 PO) Take 60 mLs by mouth 2 (two) times daily.   . [DISCONTINUED] predniSONE (DELTASONE) 20 MG tablet Take 40 mg by mouth See admin instructions. 40 mg by mouth once a day for 5 days  . [DISCONTINUED] traMADol-acetaminophen (ULTRACET) 37.5-325 MG tablet Take 1 tablet by mouth every 8 (eight) hours as needed (for pain or headaches).    ROS:                                                                                                                                        As per HPI.   Blood pressure (!) 150/79, pulse 88, resp. rate 18, height 6\' 3"  (1.905 m), weight 119.3 kg (263 lb), SpO2 92 %.  General Examination:                                                                                                      HEENT-  [place]/AT    Lungs-  Breath sounds normal. Respirations unlabored Extremities- Warm and well perfused  Neurological Examination Mental Status: Alert, oriented, thought content appropriate.  Speech fluent without evidence of aphasia.  Able to follow all commands without difficulty. Cranial Nerves: II: Visual fields  with subtle left homonymous inferior quadrantanopsia. PERRL.  III,IV, VI: ptosis not present, EOMI without nystagmus.  V,VII: No facial droop. Temp sensation normal bilaterally VIII: Hearing intact to conversation IX,X: Palate rises symmetrically XI: Symmetric XII: Midline tongue extension Motor: Right : Upper extremity   5/5    Left:     Upper extremity   5/5  Lower extremity   5/5     Lower extremity   5/5 Normal tone throughout; no atrophy noted Sensory: Temp and light touch intact x 4 Deep Tendon Reflexes: 2+ and symmetric throughout Plantars: Right: downgoing  Left: downgoing Cerebellar: No ataxia with FNF bilaterally Gait: Deferred   Lab Results: Basic Metabolic Panel: No results for input(s): NA, K, CL, CO2, GLUCOSE, BUN, CREATININE, CALCIUM, MG, PHOS in the last 168 hours.  CBC: No results for input(s): WBC, NEUTROABS, HGB, HCT, MCV, PLT in the last 168 hours.  Cardiac Enzymes: No results for input(s): CKTOTAL, CKMB, CKMBINDEX, TROPONINI in the last 168 hours.  Lipid Panel: No results for input(s): CHOL, TRIG, HDL, CHOLHDL, VLDL, LDLCALC in the last 168 hours.  Imaging: Mr Jeri Cos HW Contrast  Result Date: 07/09/2017 CLINICAL DATA:  Initial evaluation for persistent headaches for past 2 weeks EXAM:  MRI HEAD WITHOUT AND WITH CONTRAST TECHNIQUE: Multiplanar, multiecho pulse sequences of the brain and surrounding structures were obtained without and with intravenous contrast. CONTRAST:  20 cc of MultiHance. COMPARISON:  None available. FINDINGS: Brain: Study moderately degraded by motion artifact. Generalized age-related cerebral atrophy. Patchy T2/FLAIR hyperintensity within the periventricular and deep white matter both cerebral hemispheres, most consistent with chronic small vessel ischemic disease, mild in nature. There is an acute to subacute appearing intraparenchymal hematoma centered at the peripheral right temporal lobe measuring approximately 6.1 x 3.1 x 3.8 cm (series 8, image 11). Surrounding rim of vasogenic edema with mild regional mass effect. Associated small volume subarachnoid hemorrhage within the adjacent right frontotemporal region, evident on SWI sequence (series 10, image 52). Additionally, evidence for extra-axial extension with a in 3 mm right holo hemispheric subdural hematoma. Trace layering blood products within the occipital horns of both lateral ventricles, suspected to be related to redistribution (series 10, image 33). Mild mass effect on the right lateral ventricle which is partially attenuated. Trace 3 mm right-to-left shift. No hydrocephalus or ventricular trapping. Basilar cisterns are patent. Scattered leptomeningeal enhancement within the right frontotemporal region likely reactive. No discernible mass lesion seen underlying the intraparenchymal hematoma. Abnormal flow void within the right transverse and sigmoid sinus, suspicious for possible venous sinus thrombosis (series 7, image 8). No evidence for acute infarct. Gray-white matter differentiation otherwise maintained. No other encephalomalacia to suggest chronic infarction. No other mass lesion. Pituitary gland within normal limits. Midline structures intact and normal. Vascular: Major arterial vascular flow voids  maintained at the skull base. Skull and upper cervical spine: Craniocervical junction within normal limits. Upper cervical spine normal. Bone marrow signal intensity within normal limits. No scalp soft tissue abnormality. Sinuses/Orbits: Globes and orbital soft tissues within normal limits. Patient status post lens extraction bilaterally. Scattered mucosal thickening within the maxillary sinuses and ethmoidal air cells. Trace bilateral mastoid effusions noted. Other: None. IMPRESSION: 1. 6.1 x 3.1 x 3.8 cm acute to subacute intraparenchymal hematoma positioned within the anterior right temporal lobe. Associated edema with regional mass effect with trace 3 mm right-to-left midline shift. 2. Associated small volume subarachnoid hemorrhage within the adjacent right frontotemporal region. Trace intraventricular blood products suspected to be related to redistribution. 3.  Evidence for intra-axial extension with small 3 mm right holo hemispheric subdural hematoma. 4. Abnormal flow void within the right transverse and sigmoid sinuses, suspicious for venous sinus thrombosis. Findings suspected to be the underlying etiology for the intraparenchymal hemorrhage. 5. Mild chronic small vessel ischemic disease. Critical Value/emergent results were called by telephone at the time of interpretation on 07/09/2017 at 9:19 pm to Dr. Tomi Bamberger , who verbally acknowledged these results. Electronically Signed   By: Jeannine Boga M.D.   On: 07/09/2017 21:20    Assessment: 72 year old male with large right temporal lobe venous infarction secondary to acute/subacute right transverse and sigmoid sinus thrombosis. There is a small subdural hematoma and a small amount of subarachnoid hemorrhage adjacent to the right temporal lobe, in addition to trace intraventricular blood products.  1.The venous sinus thrombosis is suspected to be the underlying etiology for the intraparenchymal hemorrhage (venous right temporal lobe stroke with  hemorrhagic conversion). DDx for underlying etiology includes hypercoagulability. He has a history of prostate CA.  2. Exam is nonfocal except for a subtle homonymous left inferior quadrantanopsia. Based upon the location of the hemorrhage, he would be expected to have a left superior quadrantanopsia instead.  3. PMHx includes prostate CA, hypercholesterolemia, HTN, polycythemia, shingles and sleep apnea.   Recommendations: 1. Admitting to the ICU under the Neurology service.  2. Unable to anticoagulate due to intracerebral hemorrhage.  3. Consider endovascular procedure to retract clot.  4. BP management with clevidipine gtt 5. Phenergan 12.5 mg IV for headache (ordered) 6. Hypercoagulable panel (ordered) 7. AM stroke team to consider CT of chest, abdomen and pelvis for possible malignancy which could predispose to hypercoagulability 8. Repeat CT head in 24 hours 9. Have verified that patient wishes to be full code. 10. TTE to assess cardiac function 11. No antiplatelet medications or anticoagulants. DVT prophylaxis with SCDs. 12. Frequent neuro checks  Electronically signed: Dr. Kerney Elbe 07/09/2017, 9:35 PM

## 2017-07-09 NOTE — ED Triage Notes (Signed)
The pt arrived by [facility] from  imaging he has had a headache for 2 weeks  Worse for 2 days with nausea  Some type head bleed registered there

## 2017-07-09 NOTE — ED Notes (Signed)
cardene stoooed cleviprex at the bedside bp still droing  Holding on cleviprex until dr knapp can review

## 2017-07-10 ENCOUNTER — Inpatient Hospital Stay (HOSPITAL_COMMUNITY): Payer: Medicare Other

## 2017-07-10 ENCOUNTER — Other Ambulatory Visit (HOSPITAL_COMMUNITY): Payer: Medicare Other

## 2017-07-10 DIAGNOSIS — I611 Nontraumatic intracerebral hemorrhage in hemisphere, cortical: Secondary | ICD-10-CM | POA: Diagnosis not present

## 2017-07-10 DIAGNOSIS — I639 Cerebral infarction, unspecified: Secondary | ICD-10-CM | POA: Diagnosis not present

## 2017-07-10 DIAGNOSIS — Z9989 Dependence on other enabling machines and devices: Secondary | ICD-10-CM

## 2017-07-10 DIAGNOSIS — Z8546 Personal history of malignant neoplasm of prostate: Secondary | ICD-10-CM | POA: Diagnosis not present

## 2017-07-10 DIAGNOSIS — R402252 Coma scale, best verbal response, oriented, at arrival to emergency department: Secondary | ICD-10-CM | POA: Diagnosis present

## 2017-07-10 DIAGNOSIS — G936 Cerebral edema: Secondary | ICD-10-CM | POA: Diagnosis present

## 2017-07-10 DIAGNOSIS — I676 Nonpyogenic thrombosis of intracranial venous system: Secondary | ICD-10-CM | POA: Diagnosis present

## 2017-07-10 DIAGNOSIS — R402142 Coma scale, eyes open, spontaneous, at arrival to emergency department: Secondary | ICD-10-CM | POA: Diagnosis present

## 2017-07-10 DIAGNOSIS — G08 Intracranial and intraspinal phlebitis and thrombophlebitis: Secondary | ICD-10-CM | POA: Diagnosis not present

## 2017-07-10 DIAGNOSIS — R51 Headache: Secondary | ICD-10-CM | POA: Diagnosis not present

## 2017-07-10 DIAGNOSIS — E785 Hyperlipidemia, unspecified: Secondary | ICD-10-CM | POA: Diagnosis not present

## 2017-07-10 DIAGNOSIS — I609 Nontraumatic subarachnoid hemorrhage, unspecified: Secondary | ICD-10-CM | POA: Diagnosis present

## 2017-07-10 DIAGNOSIS — G4733 Obstructive sleep apnea (adult) (pediatric): Secondary | ICD-10-CM | POA: Diagnosis not present

## 2017-07-10 DIAGNOSIS — I5030 Unspecified diastolic (congestive) heart failure: Secondary | ICD-10-CM | POA: Diagnosis not present

## 2017-07-10 DIAGNOSIS — C61 Malignant neoplasm of prostate: Secondary | ICD-10-CM | POA: Diagnosis not present

## 2017-07-10 DIAGNOSIS — I10 Essential (primary) hypertension: Secondary | ICD-10-CM | POA: Diagnosis not present

## 2017-07-10 DIAGNOSIS — E669 Obesity, unspecified: Secondary | ICD-10-CM | POA: Diagnosis present

## 2017-07-10 DIAGNOSIS — D751 Secondary polycythemia: Secondary | ICD-10-CM | POA: Diagnosis present

## 2017-07-10 DIAGNOSIS — S06360A Traumatic hemorrhage of cerebrum, unspecified, without loss of consciousness, initial encounter: Secondary | ICD-10-CM | POA: Diagnosis not present

## 2017-07-10 DIAGNOSIS — J9811 Atelectasis: Secondary | ICD-10-CM | POA: Diagnosis not present

## 2017-07-10 DIAGNOSIS — R402362 Coma scale, best motor response, obeys commands, at arrival to emergency department: Secondary | ICD-10-CM | POA: Diagnosis present

## 2017-07-10 DIAGNOSIS — Z7982 Long term (current) use of aspirin: Secondary | ICD-10-CM | POA: Diagnosis not present

## 2017-07-10 DIAGNOSIS — E876 Hypokalemia: Secondary | ICD-10-CM | POA: Diagnosis not present

## 2017-07-10 DIAGNOSIS — N1330 Unspecified hydronephrosis: Secondary | ICD-10-CM | POA: Diagnosis not present

## 2017-07-10 DIAGNOSIS — Z6832 Body mass index (BMI) 32.0-32.9, adult: Secondary | ICD-10-CM | POA: Diagnosis not present

## 2017-07-10 DIAGNOSIS — N200 Calculus of kidney: Secondary | ICD-10-CM | POA: Diagnosis not present

## 2017-07-10 DIAGNOSIS — E7800 Pure hypercholesterolemia, unspecified: Secondary | ICD-10-CM | POA: Diagnosis present

## 2017-07-10 DIAGNOSIS — E860 Dehydration: Secondary | ICD-10-CM | POA: Diagnosis present

## 2017-07-10 DIAGNOSIS — R29700 NIHSS score 0: Secondary | ICD-10-CM | POA: Diagnosis present

## 2017-07-10 DIAGNOSIS — Z8249 Family history of ischemic heart disease and other diseases of the circulatory system: Secondary | ICD-10-CM | POA: Diagnosis not present

## 2017-07-10 DIAGNOSIS — I6200 Nontraumatic subdural hemorrhage, unspecified: Secondary | ICD-10-CM | POA: Diagnosis present

## 2017-07-10 DIAGNOSIS — Z91041 Radiographic dye allergy status: Secondary | ICD-10-CM | POA: Diagnosis not present

## 2017-07-10 DIAGNOSIS — I619 Nontraumatic intracerebral hemorrhage, unspecified: Secondary | ICD-10-CM

## 2017-07-10 HISTORY — PX: IR ANGIO VERTEBRAL SEL VERTEBRAL BILAT MOD SED: IMG5369

## 2017-07-10 HISTORY — PX: IR ANGIO INTRA EXTRACRAN SEL INTERNAL CAROTID BILAT MOD SED: IMG5363

## 2017-07-10 HISTORY — PX: IR ANGIO EXTERNAL CAROTID SEL EXT CAROTID UNI R MOD SED: IMG5371

## 2017-07-10 LAB — ANTITHROMBIN III: AntiThromb III Func: 92 % (ref 75–120)

## 2017-07-10 LAB — MRSA PCR SCREENING: MRSA by PCR: NEGATIVE

## 2017-07-10 LAB — HEPARIN LEVEL (UNFRACTIONATED): Heparin Unfractionated: 0.38 IU/mL (ref 0.30–0.70)

## 2017-07-10 LAB — GLUCOSE, CAPILLARY: GLUCOSE-CAPILLARY: 111 mg/dL — AB (ref 65–99)

## 2017-07-10 MED ORDER — HEPARIN SODIUM (PORCINE) 1000 UNIT/ML IJ SOLN
INTRAMUSCULAR | Status: AC
  Filled 2017-07-10: qty 2

## 2017-07-10 MED ORDER — ACETAMINOPHEN 650 MG RE SUPP: 650.0000 mg | RECTAL | Status: DC | PRN

## 2017-07-10 MED ORDER — IOPAMIDOL (ISOVUE-300) INJECTION 61%
INTRAVENOUS | Status: AC
  Administered 2017-07-10: 50 mL
  Filled 2017-07-10: qty 150

## 2017-07-10 MED ORDER — IBUPROFEN 200 MG PO TABS: 400.0000 mg | ORAL_TABLET | Freq: Four times a day (QID) | ORAL | Status: DC | PRN

## 2017-07-10 MED ORDER — HYDROCORTISONE NA SUCCINATE PF 250 MG IJ SOLR
200.0000 mg | Freq: Once | INTRAMUSCULAR | Status: DC
  Filled 2017-07-10: qty 200

## 2017-07-10 MED ORDER — HEPARIN (PORCINE) IN NACL 100-0.45 UNIT/ML-% IJ SOLN
1700.0000 [IU]/h | INTRAMUSCULAR | Status: DC
  Administered 2017-07-10: 1750 [IU]/h via INTRAVENOUS
  Administered 2017-07-11: 1900 [IU]/h via INTRAVENOUS
  Administered 2017-07-11: 1950 [IU]/h via INTRAVENOUS
  Filled 2017-07-10 (×5): qty 250

## 2017-07-10 MED ORDER — METHYLPREDNISOLONE SODIUM SUCC 125 MG IJ SOLR
125.0000 mg | INTRAMUSCULAR | Status: AC
  Administered 2017-07-10: 125 mg via INTRAVENOUS
  Filled 2017-07-10: qty 2

## 2017-07-10 MED ORDER — CYTRA-2 500-334 MG/5ML PO SOLN: Freq: Two times a day (BID) | ORAL | Status: DC

## 2017-07-10 MED ORDER — ACETAMINOPHEN 160 MG/5ML PO SOLN: 650.0000 mg | ORAL | Status: DC | PRN

## 2017-07-10 MED ORDER — SENNOSIDES-DOCUSATE SODIUM 8.6-50 MG PO TABS
1.0000 | ORAL_TABLET | Freq: Two times a day (BID) | ORAL | Status: DC
  Administered 2017-07-13 – 2017-07-14 (×3): 1 via ORAL
  Filled 2017-07-10 (×8): qty 1

## 2017-07-10 MED ORDER — DIPHENHYDRAMINE HCL 50 MG/ML IJ SOLN
25.0000 mg | INTRAMUSCULAR | Status: AC
  Administered 2017-07-10: 25 mg via INTRAVENOUS
  Filled 2017-07-10: qty 1

## 2017-07-10 MED ORDER — LIDOCAINE HCL (PF) 1 % IJ SOLN
INTRAMUSCULAR | Status: AC | PRN
  Administered 2017-07-10: 10 mL

## 2017-07-10 MED ORDER — STROKE: EARLY STAGES OF RECOVERY BOOK
Freq: Once | Status: AC
  Administered 2017-07-11: 1
  Filled 2017-07-10 (×2): qty 1

## 2017-07-10 MED ORDER — PROMETHAZINE HCL 25 MG/ML IJ SOLN
12.5000 mg | Freq: Four times a day (QID) | INTRAMUSCULAR | Status: DC | PRN
  Filled 2017-07-10: qty 1

## 2017-07-10 MED ORDER — SODIUM CHLORIDE 0.9 % IV SOLN
INTRAVENOUS | Status: AC | PRN
  Administered 2017-07-10: 10 mL/h via INTRAVENOUS

## 2017-07-10 MED ORDER — MIDAZOLAM HCL 2 MG/2ML IJ SOLN
INTRAMUSCULAR | Status: AC
  Filled 2017-07-10: qty 2

## 2017-07-10 MED ORDER — LIDOCAINE HCL 1 % IJ SOLN
INTRAMUSCULAR | Status: AC
  Filled 2017-07-10: qty 20

## 2017-07-10 MED ORDER — METHYLPREDNISOLONE SODIUM SUCC 40 MG IJ SOLR
40.0000 mg | Freq: Once | INTRAMUSCULAR | Status: AC
  Administered 2017-07-11: 40 mg via INTRAVENOUS
  Filled 2017-07-10: qty 1

## 2017-07-10 MED ORDER — DIPHENHYDRAMINE HCL 50 MG/ML IJ SOLN: 50.0000 mg | Freq: Once | INTRAMUSCULAR | Status: DC

## 2017-07-10 MED ORDER — MIDAZOLAM HCL 2 MG/2ML IJ SOLN
INTRAMUSCULAR | Status: AC | PRN
  Administered 2017-07-10: 1 mg via INTRAVENOUS

## 2017-07-10 MED ORDER — AMLODIPINE BESYLATE 10 MG PO TABS
10.0000 mg | ORAL_TABLET | Freq: Every day | ORAL | Status: DC
  Administered 2017-07-11 – 2017-07-14 (×4): 10 mg via ORAL
  Filled 2017-07-10 (×4): qty 1

## 2017-07-10 MED ORDER — SODIUM CHLORIDE 0.9 % IV SOLN
INTRAVENOUS | Status: DC
  Administered 2017-07-10 – 2017-07-12 (×4): via INTRAVENOUS

## 2017-07-10 MED ORDER — ACETAMINOPHEN 325 MG PO TABS
650.0000 mg | ORAL_TABLET | ORAL | Status: DC | PRN
  Administered 2017-07-14: 650 mg via ORAL
  Filled 2017-07-10: qty 2

## 2017-07-10 MED ORDER — HEPARIN (PORCINE) IN NACL 100-0.45 UNIT/ML-% IJ SOLN
1950.0000 [IU]/h | INTRAMUSCULAR | Status: DC
  Filled 2017-07-10: qty 250

## 2017-07-10 MED ORDER — FENTANYL CITRATE (PF) 100 MCG/2ML IJ SOLN
INTRAMUSCULAR | Status: AC | PRN
  Administered 2017-07-10 (×2): 25 ug via INTRAVENOUS

## 2017-07-10 MED ORDER — GUAIFENESIN 100 MG/5ML PO SOLN
5.0000 mL | Freq: Four times a day (QID) | ORAL | Status: DC | PRN
  Administered 2017-07-10 – 2017-07-11 (×2): 100 mg via ORAL
  Filled 2017-07-10 (×4): qty 5

## 2017-07-10 MED ORDER — LISINOPRIL 20 MG PO TABS
20.0000 mg | ORAL_TABLET | Freq: Every day | ORAL | Status: DC
  Administered 2017-07-10 – 2017-07-14 (×5): 20 mg via ORAL
  Filled 2017-07-10 (×5): qty 1

## 2017-07-10 MED ORDER — DIPHENHYDRAMINE HCL 50 MG/ML IJ SOLN
50.0000 mg | Freq: Once | INTRAMUSCULAR | Status: AC
  Administered 2017-07-11: 50 mg via INTRAVENOUS
  Filled 2017-07-10: qty 1

## 2017-07-10 MED ORDER — DIPHENHYDRAMINE HCL 25 MG PO CAPS: 50.0000 mg | ORAL_CAPSULE | Freq: Once | ORAL | Status: DC

## 2017-07-10 MED ORDER — IOPAMIDOL (ISOVUE-300) INJECTION 61%
INTRAVENOUS | Status: AC
  Administered 2017-07-10: 60 mL
  Filled 2017-07-10: qty 100

## 2017-07-10 MED ORDER — PANTOPRAZOLE SODIUM 40 MG IV SOLR
40.0000 mg | Freq: Every day | INTRAVENOUS | Status: DC
  Filled 2017-07-10: qty 40

## 2017-07-10 MED ORDER — LISINOPRIL 20 MG PO TABS: 20.0000 mg | ORAL_TABLET | Freq: Every day | ORAL | Status: DC

## 2017-07-10 MED ORDER — AMLODIPINE BESYLATE 10 MG PO TABS: 10.0000 mg | ORAL_TABLET | Freq: Every day | ORAL | Status: DC

## 2017-07-10 MED ORDER — PANTOPRAZOLE SODIUM 40 MG PO TBEC
40.0000 mg | DELAYED_RELEASE_TABLET | Freq: Every day | ORAL | Status: DC
Start: 2017-07-10 — End: 2017-07-14
  Administered 2017-07-10 – 2017-07-14 (×5): 40 mg via ORAL
  Filled 2017-07-10 (×5): qty 1

## 2017-07-10 MED ORDER — SODIUM CHLORIDE 0.9 % IV SOLN: INTRAVENOUS | Status: DC

## 2017-07-10 MED ORDER — FENTANYL CITRATE (PF) 100 MCG/2ML IJ SOLN
INTRAMUSCULAR | Status: AC
  Filled 2017-07-10: qty 2

## 2017-07-10 NOTE — ED Notes (Signed)
Pt reports some questions regarding IR procedure. Neuro paged, reports stroke team will be down to talk with him first thing when they arrive at 8 am.

## 2017-07-10 NOTE — Progress Notes (Signed)
Patient refused IV to be started, patient stated he already has two working IV's and he just wanted to be left alone and did not want to be stuck. Olean Ree RN made aware

## 2017-07-10 NOTE — ED Notes (Signed)
PA at bedside aware of patient headache.

## 2017-07-10 NOTE — ED Notes (Signed)
The pt cannot sleep very long without being disturbed  When  His arm is bent the pump goes off   Restless between naps  C/o his headache still

## 2017-07-10 NOTE — Progress Notes (Signed)
STROKE TEAM PROGRESS NOTE   SUBJECTIVE (INTERVAL HISTORY) His wife and sons are at the bedside.  Pt awake alert orientated. Repeat CT showed stable hematoma.  MRA showed no aneurysm or AVM.  MRV confirmed right transverse sinus, sigmoid sinus and jugular vein thrombosis.  We will start heparin low intensity without bolus.  We will proceed with diagnostic angiogram.  OBJECTIVE Temp:  [98 F (36.7 C)-99 F (37.2 C)] 98.4 F (36.9 C) (01/31 1418) Pulse Rate:  [78-104] 97 (01/31 1715) Cardiac Rhythm: Normal sinus rhythm (01/31 1336) Resp:  [12-32] 21 (01/31 1715) BP: (93-177)/(47-96) 125/70 (01/31 1715) SpO2:  [87 %-98 %] 91 % (01/31 1715) Weight:  [263 lb (119.3 kg)] 263 lb (119.3 kg) (01/30 2034)  Recent Labs  Lab 07/10/17 1415  GLUCAP 111*   Recent Labs  Lab 07/09/17 2049  NA 138  K 4.1  CL 103  CO2 22  GLUCOSE 134*  BUN 15  CREATININE 0.84  CALCIUM 8.9   No results for input(s): AST, ALT, ALKPHOS, BILITOT, PROT, ALBUMIN in the last 168 hours. Recent Labs  Lab 07/09/17 2049  WBC 9.8  NEUTROABS 8.4*  HGB 17.5*  HCT 51.6  MCV 86.4  PLT 209   No results for input(s): CKTOTAL, CKMB, CKMBINDEX, TROPONINI in the last 168 hours. Recent Labs    07/09/17 2049  LABPROT 13.9  INR 1.07   No results for input(s): COLORURINE, LABSPEC, PHURINE, GLUCOSEU, HGBUR, BILIRUBINUR, KETONESUR, PROTEINUR, UROBILINOGEN, NITRITE, LEUKOCYTESUR in the last 72 hours.  Invalid input(s): APPERANCEUR  No results found for: CHOL, TRIG, HDL, CHOLHDL, VLDL, LDLCALC No results found for: HGBA1C No results found for: LABOPIA, COCAINSCRNUR, LABBENZ, AMPHETMU, THCU, LABBARB  No results for input(s): ETH in the last 168 hours.  I have personally reviewed the radiological images below and agree with the radiology interpretations.  Ct Head Wo Contrast  Result Date: 07/10/2017 CLINICAL DATA:  72 year old male with right temporal lobe intra-axial hemorrhage diagnosed yesterday after presentation  with persistent headache. EXAM: CT HEAD WITHOUT CONTRAST TECHNIQUE: Contiguous axial images were obtained from the base of the skull through the vertex without intravenous contrast. COMPARISON:  Head CT without contrast 0152 hr today. Brain MRI 07/09/2017. FINDINGS: Brain: Mostly hyperdense intra-axial hemorrhage within the right temporal lobe centrally and involving the inferior middle and superior temporal gyri. Hemorrhage encompasses 62 by 31 by 38 millimeters (AP by transverse by CC) for an estimated blood volume of 37 milliliters -unchanged from the 07/09/2017 MRI. Surrounding edema and regional mass effect remain stable. No intraventricular extension. The small 2-3 millimeter superimposed right subdural hematoma more apparent by MRI is stable. Stable trace leftward midline shift. Basilar cisterns remain stable. Stable ventricle size and configuration. Stable gray-white matter differentiation throughout the brain. No new cortically based infarct identified. Vascular: Asymmetric hyperdensity of the right transverse and sigmoid sinuses. Corresponding to loss of enhancement and filling defect on MRI yesterday. Calcified atherosclerosis at the skull base. Skull: Stable and negative. Sinuses/Orbits: Stable. Widespread ethmoid mucosal thickening. Bilateral maxillary sinus bubbly opacity. Tympanic cavities and mastoids remain clear. Other: Stable orbit and scalp soft tissues. IMPRESSION: 1. Right transverse and sigmoid sinus thrombosis with acute right temporal lobe intra-axial hemorrhage and small/trace right subdural hematoma. 2. Stable parenchymal hemorrhage volume since the MRI 07/09/2017, estimated at 37 mL. Stable surrounding edema and mild regional mass effect. 3. Stable trace right lateral subdural hematoma. No intraventricular hemorrhage extension or ventriculomegaly. No increasing intracranial mass effect. Stable trace leftward midline shift. 4. No new No acute  intracranial abnormality. Electronically Signed    By: Genevie Ann M.D.   On: 07/10/2017 08:39   Ct Head Wo Contrast  Result Date: 07/10/2017 CLINICAL DATA:  72 y/o M; 2 weeks of headache and 2 days of nausea, suspect intracranial hemorrhage. EXAM: CT HEAD WITHOUT CONTRAST TECHNIQUE: Contiguous axial images were obtained from the base of the skull through the vertex without intravenous contrast. COMPARISON:  07/09/2017 MRI head FINDINGS: Brain: Right temporal lobe hematoma spanning 6.0 x 2.7 x 2.7 cm (volume = 23 cm^3)(AP x ML x CC series 5, image 41 and series 6, image 17). Surrounding edema and result in mass effect results in minimal right-sided uncal herniation, partial effacement of the right lateral ventricle, and 3 mm of right-to-left midline shift. There is a small volume of subarachnoid hemorrhage overlying the right temporal lobe and trace volume of hemorrhage within occipital horns of lateral ventricles likely due to redistribution. Findings are stable from prior MRI of the brain given differences in technique. No additional acute intracranial hemorrhage, large territory infarct, or focal mass effect is identified. Background of mild chronic microvascular ischemic changes and parenchymal volume loss of the brain. Vascular: No hyperdense vessel or unexpected calcification. Skull: Normal. Negative for fracture or focal lesion. Sinuses/Orbits: Moderate diffuse paranasal sinus mucosal thickening with aerosolized secretions in the maxillary sinuses. Normal aeration of mastoid air cells. Bilateral intra-ocular lens replacement. Other: None. IMPRESSION: 1. Stable mixed attenuation hematoma in the right temporal lobe measuring up to 6 cm, 23 cc. Stable mass effect with 3 mm right-to-left midline shift. Stable small volume subarachnoid hemorrhage over right convexity and trace intraventricular hemorrhage likely due to redistribution. 2. No new acute intracranial abnormality identified. These results were called by telephone at the time of interpretation on  07/10/2017 at 2:14 am to Dr. Kerney Elbe , who verbally acknowledged these results. Electronically Signed   By: Kristine Garbe M.D.   On: 07/10/2017 02:13   Mr Virgel Paling RC Contrast  Result Date: 07/10/2017 CLINICAL DATA:  Right temporal lobe intraparenchymal hemorrhage with suspicion of venous thrombosis. EXAM: MRA head without CONTRAST MRV HEAD WITHOUT CONTRAST TECHNIQUE: Multiplanar, multiecho pulse sequences of the brain and surrounding structures were obtained with intravenous contrast. Angiographic images of the intracranial venous structures were obtained using MRV technique without intravenous contrast. COMPARISON:  MRI 07/09/2017.  CT studies earlier same day. FINDINGS: MRA HEAD FINDINGS Some motion degradation. Both internal carotid arteries are widely patent through the skull base and siphon regions. Left anterior and middle cerebral vessels are normal without proximal stenosis, aneurysm or vascular malformation. On the right, the anterior cerebral vessels appear normal. The right M1 segment is patent. There is distortion of the right middle cerebral artery branches secondary to the intraparenchymal hematoma. I do not suspect a primary arterial abnormality. No sign of aneurysm or high flow vascular malformation. Posterior circulation vessels appear normal. Both vertebral arteries widely patent to the basilar. No basilar stenosis. Posterior circulation branch vessels appear normal, with some motion degradation. MRV HEAD FINDINGS Superior sagittal sinus is patent. Deep veins are patent. Left transverse sinus, sigmoid sinus and jugular vein are patent. There thrombosis of the right transverse sinus, sigmoid sinus and jugular vein. IMPRESSION: MR venography confirmed thrombosis of the right transverse sinus, sigmoid sinus and jugular vein. Superior sagittal sinus, deep venous system and left sided drainage are patent. MR angiography does not show any primary arterial side pathology. There is  diminished visualization of the right MCA branches due to the regional hematoma.  No sign of aneurysm or high flow vascular abnormality. Electronically Signed   By: Nelson Chimes M.D.   On: 07/10/2017 11:04   Mr Jeri Cos YS Contrast  Result Date: 07/09/2017 CLINICAL DATA:  Initial evaluation for persistent headaches for past 2 weeks EXAM: MRI HEAD WITHOUT AND WITH CONTRAST TECHNIQUE: Multiplanar, multiecho pulse sequences of the brain and surrounding structures were obtained without and with intravenous contrast. CONTRAST:  20 cc of MultiHance. COMPARISON:  None available. FINDINGS: Brain: Study moderately degraded by motion artifact. Generalized age-related cerebral atrophy. Patchy T2/FLAIR hyperintensity within the periventricular and deep white matter both cerebral hemispheres, most consistent with chronic small vessel ischemic disease, mild in nature. There is an acute to subacute appearing intraparenchymal hematoma centered at the peripheral right temporal lobe measuring approximately 6.1 x 3.1 x 3.8 cm (series 8, image 11). Surrounding rim of vasogenic edema with mild regional mass effect. Associated small volume subarachnoid hemorrhage within the adjacent right frontotemporal region, evident on SWI sequence (series 10, image 52). Additionally, evidence for extra-axial extension with a in 3 mm right holo hemispheric subdural hematoma. Trace layering blood products within the occipital horns of both lateral ventricles, suspected to be related to redistribution (series 10, image 33). Mild mass effect on the right lateral ventricle which is partially attenuated. Trace 3 mm right-to-left shift. No hydrocephalus or ventricular trapping. Basilar cisterns are patent. Scattered leptomeningeal enhancement within the right frontotemporal region likely reactive. No discernible mass lesion seen underlying the intraparenchymal hematoma. Abnormal flow void within the right transverse and sigmoid sinus, suspicious for  possible venous sinus thrombosis (series 7, image 8). No evidence for acute infarct. Gray-white matter differentiation otherwise maintained. No other encephalomalacia to suggest chronic infarction. No other mass lesion. Pituitary gland within normal limits. Midline structures intact and normal. Vascular: Major arterial vascular flow voids maintained at the skull base. Skull and upper cervical spine: Craniocervical junction within normal limits. Upper cervical spine normal. Bone marrow signal intensity within normal limits. No scalp soft tissue abnormality. Sinuses/Orbits: Globes and orbital soft tissues within normal limits. Patient status post lens extraction bilaterally. Scattered mucosal thickening within the maxillary sinuses and ethmoidal air cells. Trace bilateral mastoid effusions noted. Other: None. IMPRESSION: 1. 6.1 x 3.1 x 3.8 cm acute to subacute intraparenchymal hematoma positioned within the anterior right temporal lobe. Associated edema with regional mass effect with trace 3 mm right-to-left midline shift. 2. Associated small volume subarachnoid hemorrhage within the adjacent right frontotemporal region. Trace intraventricular blood products suspected to be related to redistribution. 3. Evidence for intra-axial extension with small 3 mm right holo hemispheric subdural hematoma. 4. Abnormal flow void within the right transverse and sigmoid sinuses, suspicious for venous sinus thrombosis. Findings suspected to be the underlying etiology for the intraparenchymal hemorrhage. 5. Mild chronic small vessel ischemic disease. Critical Value/emergent results were called by telephone at the time of interpretation on 07/09/2017 at 9:19 pm to Dr. Tomi Bamberger , who verbally acknowledged these results. Electronically Signed   By: Jeannine Boga M.D.   On: 07/09/2017 21:20   Mr Mrv Head Wo Cm  Result Date: 07/10/2017 CLINICAL DATA:  Right temporal lobe intraparenchymal hemorrhage with suspicion of venous  thrombosis. EXAM: MRA head without CONTRAST MRV HEAD WITHOUT CONTRAST TECHNIQUE: Multiplanar, multiecho pulse sequences of the brain and surrounding structures were obtained with intravenous contrast. Angiographic images of the intracranial venous structures were obtained using MRV technique without intravenous contrast. COMPARISON:  MRI 07/09/2017.  CT studies earlier same day. FINDINGS: MRA HEAD FINDINGS  Some motion degradation. Both internal carotid arteries are widely patent through the skull base and siphon regions. Left anterior and middle cerebral vessels are normal without proximal stenosis, aneurysm or vascular malformation. On the right, the anterior cerebral vessels appear normal. The right M1 segment is patent. There is distortion of the right middle cerebral artery branches secondary to the intraparenchymal hematoma. I do not suspect a primary arterial abnormality. No sign of aneurysm or high flow vascular malformation. Posterior circulation vessels appear normal. Both vertebral arteries widely patent to the basilar. No basilar stenosis. Posterior circulation branch vessels appear normal, with some motion degradation. MRV HEAD FINDINGS Superior sagittal sinus is patent. Deep veins are patent. Left transverse sinus, sigmoid sinus and jugular vein are patent. There thrombosis of the right transverse sinus, sigmoid sinus and jugular vein. IMPRESSION: MR venography confirmed thrombosis of the right transverse sinus, sigmoid sinus and jugular vein. Superior sagittal sinus, deep venous system and left sided drainage are patent. MR angiography does not show any primary arterial side pathology. There is diminished visualization of the right MCA branches due to the regional hematoma. No sign of aneurysm or high flow vascular abnormality. Electronically Signed   By: Nelson Chimes M.D.   On: 07/10/2017 11:04   TTE pending   PHYSICAL EXAM  Temp:  [98 F (36.7 C)-99 F (37.2 C)] 98.4 F (36.9 C) (01/31  1418) Pulse Rate:  [78-104] 97 (01/31 1715) Resp:  [12-32] 21 (01/31 1715) BP: (93-177)/(47-96) 125/70 (01/31 1715) SpO2:  [87 %-98 %] 91 % (01/31 1715) Weight:  [263 lb (119.3 kg)] 263 lb (119.3 kg) (01/30 2034)  General - Well nourished, well developed, in no apparent distress.  Ophthalmologic - fundi not visualized due to noncooperation.  Cardiovascular - Regular rate and rhythm with no murmur.  Mental Status: Alert, oriented, thought content appropriate.  Speech fluent without evidence of aphasia.  Able to follow all commands without difficulty. Cranial Nerves: II: Visual fields with subtle left homonymous inferior quadrantanopsia. PERRL.  III,IV, VI: ptosis not present, EOMI without nystagmus.  V,VII: No facial droop. Temp sensation normal bilaterally VIII: Hearing intact to conversation IX,X: Palate rises symmetrically XI: Symmetric XII: Midline tongue extension Motor: Right :  Upper extremity   5/5                                      Left:     Upper extremity   5/5             Lower extremity   5/5                                                  Lower extremity   5/5 Normal tone throughout; no atrophy noted Sensory: Temp and light touch intact x 4 Deep Tendon Reflexes: 2+ and symmetric throughout Plantars: Right: downgoing               Left: downgoing Cerebellar: No ataxia with FNF bilaterally Gait: Deferred  ASSESSMENT/PLAN [name] is a 72 y.o. male with history of prostate cancer, HTN, HLD, polycythemia, OSA admitted for right frontal headache and nausea. No tPA given due to [place].    Cerebral venous thrombosis involving right transverse sinus, sigmoid sinus and jugular vein with right  temporal ICH - etiology unclear, DDX including malignancy (given patient history of a prostate cancer), polycythemia, or dehydration.  Resultant HA  MRI  Right temporal ICH with likely right transverse sinus thrombosis  MRA  No AVM or aneurysm  MRV  thrombosis of the  right transverse sinus, sigmoid sinus and jugular vein  DSA - Occluded RT Transverse and RT sigmoid sinus and poorly opacified RT internal jugular vein.  2D Echo  Pending  Pan CT to rule out malignancy - 4hr preparation for contrast allergy  LDL pending  HgbA1c pending  Heparin IV for VTE prophylaxis  Fall precautions  Diet Heart Room service appropriate? Yes; Fluid consistency: Thin   No antithrombotic prior to admission, now on heparin IV low intensity with heparin level 0.3-0.5 without bolus.   Ongoing aggressive stroke risk factor management  Therapy recommendations:  pending  Disposition:  Pending  ICH - right temporal due to venous thrombosis  Heparin IV, low intensity without bolus  Close neuro monitoring  BP goal < 160  If any neuro changes, stop heparin and stat head CT  Hypertension Stable BP goal < 160 On cleviprex Add amlodipine and lisinopril  Long term BP goal normotensive  Hyperlipidemia  Home meds:  None   LDL pending, goal < 70  Other Stroke Risk Factors  Advanced age  Obesity, Body mass index is 32.87 kg/m.   Obstructive sleep apnea, on CPAP at home  Other Active Problems  Hx of prostate cancer   Hospital day # 0  This patient is critically ill due to venous thrombosis with ICH, cerebral edema, hypertension and at significant risk of neurological worsening, death form recurrent bleeding, hematoma expansion, thrombosis extension, hypertensive emergency, heart failure. This patient's care requires constant monitoring of vital signs, hemodynamics, respiratory and cardiac monitoring, review of multiple databases, neurological assessment, discussion with family, other specialists and medical decision making of high complexity. I had long discussion with wife at bedside and later over the phone, updated pt current condition, treatment plan and potential prognosis. They expressed understanding and appreciation. I spent 45 minutes of  neurocritical care time in the care of this patient.  Rosalin Hawking, MD PhD Stroke Neurology 07/10/2017 5:35 PM    To contact Stroke Continuity provider, please refer to [email or web address] After hours, contact General Neurology

## 2017-07-10 NOTE — ED Notes (Signed)
The family has been asking why no admitting doctor has seen this pt yet.  hes not been placed in the computer yet for a bed

## 2017-07-10 NOTE — Procedures (Signed)
S/P 4vessel and RT ECA arteriogram. RT CFA approach. Findings  1.Occluded RT Transverse and RT sigmoid sinus and poorly opacified RT internal jugular vein. 2. Prolonged clearance of contrast from RT cerebral hemishere.

## 2017-07-10 NOTE — ED Notes (Signed)
IR calling reporting to go ahead and pre-medicate patient, and they will be coming to get him soon.

## 2017-07-10 NOTE — Progress Notes (Signed)
ANTICOAGULATION CONSULT NOTE - Initial Consult  Pharmacy Consult for heparin Indication: Sinus Venous Thrombosis  Allergies  Allergen Reactions  . Iodinated Diagnostic Agents Rash    Patient Measurements: Height: 6\' 3"  (190.5 cm) Weight: 263 lb (119.3 kg) IBW/kg (Calculated) : 84.5 Heparin Dosing Weight: 109 kg  Vital Signs: Temp: 99 F (37.2 C) (01/31 0509) BP: 132/74 (01/31 1240) Pulse Rate: 94 (01/31 1240)  Labs: Recent Labs    07/09/17 2049  HGB 17.5*  HCT 51.6  PLT 209  APTT 27  LABPROT 13.9  INR 1.07  CREATININE 0.84    Estimated Creatinine Clearance: 109 mL/min (by C-G formula based on SCr of 0.84 mg/dL).   Medical History: Past Medical History:  Diagnosis Date  . Adenomatous polyp   . Cancer [facility]) 2003   prostate  . Hemorrhoids   . Hypercholesteremia   . Hypertension   . Muscular degeneration   . Nephrolithiasis   . Obesity   . Polycythemia   . Shingles   . Sleep apnea     Medications:  No anticoagulants prior to admission  Assessment: 72 y.o. Male presented with subacute right temporal lobe hemorrhage complicated by sinus thrombosis.    Consult placed for heparin but patient has been transported to IR.  Will follow up post procedure for appropriate heparin start time, goals, and rate.  Thank you for allowing [place] to participate in this patients care.  Mariella Saa, PharmD 07/10/2017 12:49 PM

## 2017-07-10 NOTE — Sedation Documentation (Signed)
5 Fr. Exoseal to right groin 

## 2017-07-10 NOTE — Progress Notes (Signed)
ANTICOAGULATION CONSULT NOTE - Follow Up Consult  Pharmacy Consult for heparin Indication: Sinus Venous Thrombosis  Allergies  Allergen Reactions  . Iodinated Diagnostic Agents Rash    Patient Measurements: Height: 6\' 3"  (190.5 cm) Weight: 263 lb (119.3 kg) IBW/kg (Calculated) : 84.5 Heparin Dosing Weight: 109 kg  Vital Signs: Temp: 98.3 F (36.8 C) (01/31 1913) Temp Source: Oral (01/31 1913) BP: 121/62 (01/31 2045) Pulse Rate: 88 (01/31 2045)  Labs: Recent Labs    07/09/17 2049 07/10/17 2048  HGB 17.5*  --   HCT 51.6  --   PLT 209  --   APTT 27  --   LABPROT 13.9  --   INR 1.07  --   HEPARINUNFRC  --  0.38  CREATININE 0.84  --     Estimated Creatinine Clearance: 109 mL/min (by C-G formula based on SCr of 0.84 mg/dL).   Assessment: 72 y.o. Male presented with subacute right temporal lobe hemorrhage complicated by sinus thrombosis.  He is not on anticoagulation prior to admission.  Patient went to IR with Dr. Estanislado Pandy for 4 vessel and RT ECA arteriogram which showed occluded RT transverse and RT sigmoid sinus with poorly opacified RT internal juglular vein along with prolonged clearance of contrast from RT cerebral hemisphere.  Post-IR, personally discussed with Dr. Erlinda Hong. Madaline Brilliant to start heparin with NO BOLUS and low goal rate.  Heparin drip 1750 uts/hr HL 0.38 at goal.  Goals of Therapy: Heparin level 0.3-0.5units/mL  Plan: Continue heparin at 1750units/hr  Daily heparin level and CBC Follow very carefully for s/s bleeding   Bonnita Nasuti Pharm.D. CPP, BCPS Clinical Pharmacist [phone] 07/10/2017 9:56 PM

## 2017-07-10 NOTE — ED Notes (Addendum)
Neurology calling reporting patient needs stat MRI , they are holding a table. Will take him over. Holding heparin at this time.

## 2017-07-10 NOTE — ED Notes (Signed)
Pt sleeping bp dropped whenever he started sleeping  He has not slept for the past 2 days

## 2017-07-10 NOTE — Progress Notes (Deleted)
ANTICOAGULATION CONSULT NOTE - Initial Consult  Pharmacy Consult for Heparin Indication: cerebral venous thrombosis w/ ICH  Allergies  Allergen Reactions  . Iodinated Diagnostic Agents Rash   Patient Measurements: Height: 6\' 3"  (190.5 cm) Weight: 263 lb (119.3 kg) IBW/kg (Calculated) : 84.5 Heparin Dosing Weight: 110 kg  Vital Signs: Temp: 98.4 F (36.9 C) (01/31 1418) Temp Source: Oral (01/31 1418) BP: 128/76 (01/31 1337) Pulse Rate: 82 (01/31 1337)  Labs: Recent Labs    07/09/17 2049  HGB 17.5*  HCT 51.6  PLT 209  APTT 27  LABPROT 13.9  INR 1.07  CREATININE 0.84   Estimated Creatinine Clearance: 109 mL/min (by C-G formula based on SCr of 0.84 mg/dL).  Assessment: 66 yoM presenting with large R temporal lobe venous infarction 2/2 acute R transverse and sigmoid thrombosis with small subdural hematoma and subarachnoid hemhorrage, now s/p 4 vessel arteriogram. Pharmacy consulted to dose IV heparin with low heparin level goal of 0.3-0.5. Discussed heparin goal with Dr. Estanislado Pandy who deferred to neurology. Hgb 17.6, pltc WNL. No bleeding noted.  Goal of Therapy:  Heparin level 0.3-0.5 units/ml Monitor platelets by anticoagulation protocol: Yes   Plan:  No heparin bolus Start heparin infusion at 1000 units/hr Check heparin level in 8 hours Daily heparin level and CBC Monitor for s/sx of bleeding  Erin N. Gerarda Fraction, PharmD PGY1 Pharmacy Resident Pager: [phone] 07/10/2017,2:32 PM

## 2017-07-10 NOTE — ED Notes (Addendum)
Neuro in the dept with another pt. Concerns from pt and family  Given to him  He will see shortly

## 2017-07-10 NOTE — Progress Notes (Signed)
ANTICOAGULATION CONSULT NOTE - Initial Consult  Pharmacy Consult for heparin Indication: Sinus Venous Thrombosis  Allergies  Allergen Reactions  . Iodinated Diagnostic Agents Rash    Patient Measurements: Height: 6\' 3"  (190.5 cm) Weight: 263 lb (119.3 kg) IBW/kg (Calculated) : 84.5 Heparin Dosing Weight: 109 kg  Vital Signs: Temp: 98.4 F (36.9 C) (01/31 1418) Temp Source: Oral (01/31 1418) BP: 128/76 (01/31 1337) Pulse Rate: 82 (01/31 1337)  Labs: Recent Labs    07/09/17 2049  HGB 17.5*  HCT 51.6  PLT 209  APTT 27  LABPROT 13.9  INR 1.07  CREATININE 0.84    Estimated Creatinine Clearance: 109 mL/min (by C-G formula based on SCr of 0.84 mg/dL).   Assessment: 72 y.o. Male presented with subacute right temporal lobe hemorrhage complicated by sinus thrombosis.  He is not on anticoagulation prior to admission.  Patient went to IR with Dr. Estanislado Pandy for 4 vessel and RT ECA arteriogram which showed occluded RT transverse and RT sigmoid sinus with poorly opacified RT internal juglular vein along with prolonged clearance of contrast from RT cerebral hemisphere.  Post-IR, personally discussed with Dr. Erlinda Hong. Madaline Brilliant to start heparin with NO BOLUS and low goal rate.  Goals of Therapy: Heparin level 0.3-0.5units/mL  Plan: Start heparin at 1750units/hr (~16units/kg/hr) Heparin level in 6h Daily heparin level and CBC Follow very carefully for s/s bleeding   Thank you for allowing [place] to participate in this patients care.  Moody Robben D. Dorisann Schwanke, PharmD, BCPS Clinical Pharmacist Clinical Phone for 07/10/2017 until 3:30pm: [phone] If after 3:30pm, please call main pharmacy at [phone] 07/10/2017 2:36 PM

## 2017-07-10 NOTE — ED Notes (Signed)
Pt sleeping   Unifocal  pvcs frequjent

## 2017-07-10 NOTE — Progress Notes (Signed)
Pt passed bedside swallow eval and placed on a heart healthy diet

## 2017-07-10 NOTE — Progress Notes (Signed)
PT Cancellation Note  Patient Details Name: Justin Adams MRN: [id number] DOB: 11-Oct-1945   Cancelled Treatment:    Reason Eval/Treat Not Completed: Patient not medically ready Pt with current bed rest orders. Will follow up as activity orders updated and as schedule allows.   Leighton Ruff, PT, DPT  Acute Rehabilitation Services  Pager: [phone]  Rudean Hitt 07/10/2017, 11:43 AM

## 2017-07-10 NOTE — ED Notes (Signed)
The family is getting more concerned that the admitting doctor has not seen .  The pt is getting upset also

## 2017-07-10 NOTE — ED Notes (Signed)
Returned from head CT.

## 2017-07-10 NOTE — ED Notes (Signed)
The pt reports that his headache is getting much worse

## 2017-07-10 NOTE — ED Notes (Addendum)
IR PA at bedside discussing options with patient and family. Reports to give benadryl, solumedrol when on call to IR.

## 2017-07-10 NOTE — Sedation Documentation (Signed)
[id number] removed per Dr. Deveshwar  

## 2017-07-10 NOTE — Sedation Documentation (Signed)
6 Fr. angioseal to right groin

## 2017-07-10 NOTE — ED Notes (Signed)
Neuro at the bedside.

## 2017-07-10 NOTE — ED Notes (Signed)
Pt still c/o a severe headache sleeping in short intervals  Will not get an icu bed tonight  Pt notified of the saqme m moved to a regular hospital bed for comfort  He walked two steps from the stretcher to the hospital bed steady gait.

## 2017-07-10 NOTE — Progress Notes (Signed)
This RN spoke with Angela Nevin, [facility] she states that this pt will have to go to 77M as there are no beds available on 4N. Phone call to bed placement. Audelia Acton states that she is working on getting a 77M bed available for this pt. Audelia Acton asks that we please call back when pt is 15 mins prior to needing to transfer. Will call bed placement back.

## 2017-07-10 NOTE — Consult Note (Signed)
Chief Complaint: Patient was seen in consultation today for headache  Referring Physician(s): Dr. Erlinda Hong  Supervising Physician: Luanne Bras  Patient Status: [facility] - In-pt  History of Present Illness: Justin Adams is a 72 y.o. male with history of prostate cancer, HTN, polycythemia, sleep apnea who presents with severe headache for the past 2 weeks.  His headache worsened and he developed nausea prompting him to present for evaluation.    MRI Brain 07/09/17 showed: 1. 6.1 x 3.1 x 3.8 cm acute to subacute intraparenchymal hematoma positioned within the anterior right temporal lobe. Associated edema with regional mass effect with trace 3 mm right-to-left midline shift. 2. Associated small volume subarachnoid hemorrhage within the adjacent right frontotemporal region. Trace intraventricular blood products suspected to be related to redistribution. 3. Evidence for intra-axial extension with small 3 mm right holo hemispheric subdural hematoma. 4. Abnormal flow void within the right transverse and sigmoid sinuses, suspicious for venous sinus thrombosis. Findings suspected to be the underlying etiology for the intraparenchymal hemorrhage. 5. Mild chronic small vessel ischemic disease.  CT Head 07/10/17 shows: 1. Right transverse and sigmoid sinus thrombosis with acute right temporal lobe intra-axial hemorrhage and small/trace right subdural hematoma. 2. Stable parenchymal hemorrhage volume since the MRI 07/09/2017, estimated at 37 mL. Stable surrounding edema and mild regional mass effect. 3. Stable trace right lateral subdural hematoma. No intraventricular hemorrhage extension or ventriculomegaly. No increasing intracranial mass effect. Stable trace leftward midline shift. 4. No new No acute intracranial abnormality.  IR consulted for possible angiogram vs. Intervention.  Case discussed with Dr. Estanislado Pandy who recommends proceeding with diagnostic angiogram.   Patient  has been NPO.  He does have contrast allergy.   Past Medical History:  Diagnosis Date  . Adenomatous polyp   . Cancer [facility]) 2003   prostate  . Hemorrhoids   . Hypercholesteremia   . Hypertension   . Muscular degeneration   . Nephrolithiasis   . Obesity   . Polycythemia   . Shingles   . Sleep apnea     Past Surgical History:  Procedure Laterality Date  . CHOLECYSTECTOMY    . LEFT HEART CATHETERIZATION WITH CORONARY ANGIOGRAM N/A 03/15/2013   Procedure: LEFT HEART CATHETERIZATION WITH CORONARY ANGIOGRAM;  Surgeon: Minus Breeding, MD;  Location: [facility] CATH LAB;  Service: Cardiovascular;  Laterality: N/A;  . PROSTATECTOMY    . STONE EXTRACTION WITH BASKET      Allergies: Iodinated diagnostic agents  Medications: Prior to Admission medications   Medication Sig Start Date End Date Taking? Authorizing Provider  acetaminophen (TYLENOL) 500 MG tablet Take 500-1,000 mg by mouth every 6 (six) hours as needed (for headaches).   Yes [provider]  ibuprofen (ADVIL,MOTRIN) 200 MG tablet Take 400 mg by mouth every 6 (six) hours as needed (for headaches).   Yes [provider]  Multiple Vitamins-Minerals (ICAPS PO) Take 1 capsule by mouth 2 (two) times daily.   Yes [provider]  Pseudoeph-Doxylamine-DM-APAP (NYQUIL PO) Take 10-15 mLs by mouth every 6 (six) hours as needed (for symptoms).   Yes [provider]  Sod Citrate-Citric Acid (CYTRA-2 PO) Take 60 mLs by mouth 2 (two) times daily.    Yes [provider]     Family History  Problem Relation Age of Onset  . Hypertension Mother   . Coronary artery disease Mother 27  . Breast cancer Mother   . Coronary artery disease Father 92       Died age 59  . CAD  Brother 59    Social History   Socioeconomic History  . Marital status: Married    Spouse name: None  . Number of children: 2  . Years of education: None  . Highest education level: None  Social Needs  . Financial resource  strain: None  . Food insecurity - worry: None  . Food insecurity - inability: None  . Transportation needs - medical: None  . Transportation needs - non-medical: None  Occupational History  . Occupation: Banker    Comment: [employer]  Tobacco Use  . Smoking status: Never Smoker  . Smokeless tobacco: Never Used  Substance and Sexual Activity  . Alcohol use: No    Frequency: Never  . Drug use: None  . Sexual activity: None  Other Topics Concern  . None  Social History Narrative  . None    Review of Systems  Constitutional: Negative for fatigue and fever.  Respiratory: Negative for cough and shortness of breath.   Gastrointestinal: Negative for abdominal pain.  Psychiatric/Behavioral: Negative for behavioral problems and confusion.    Vital Signs: BP (!) 149/84   Pulse 83   Temp 99 F (37.2 C)   Resp 19   Ht 6\' 3"  (1.905 m)   Wt 263 lb (119.3 kg)   SpO2 96%   BMI 32.87 kg/m   Physical Exam  Constitutional: He is oriented to person, place, and time. He appears well-developed.  Keeps eyes closed during conversation.  Cardiovascular: Normal rate, regular rhythm and normal heart sounds.  Pulmonary/Chest: Effort normal and breath sounds normal. No respiratory distress.  Abdominal: Soft.  Musculoskeletal: Normal range of motion.  Neurological: He is alert and oriented to person, place, and time. He has normal strength.  Psychiatric: He has a normal mood and affect. His behavior is normal. His mood appears not anxious. He is not agitated.  Nursing note and vitals reviewed.   Imaging: Ct Head Wo Contrast  Result Date: 07/10/2017 CLINICAL DATA:  72 year old male with right temporal lobe intra-axial hemorrhage diagnosed yesterday after presentation with persistent headache. EXAM: CT HEAD WITHOUT CONTRAST TECHNIQUE: Contiguous axial images were obtained from the base of the skull through the vertex without intravenous contrast. COMPARISON:  Head CT without contrast 0152 hr today.  Brain MRI 07/09/2017. FINDINGS: Brain: Mostly hyperdense intra-axial hemorrhage within the right temporal lobe centrally and involving the inferior middle and superior temporal gyri. Hemorrhage encompasses 62 by 31 by 38 millimeters (AP by transverse by CC) for an estimated blood volume of 37 milliliters -unchanged from the 07/09/2017 MRI. Surrounding edema and regional mass effect remain stable. No intraventricular extension. The small 2-3 millimeter superimposed right subdural hematoma more apparent by MRI is stable. Stable trace leftward midline shift. Basilar cisterns remain stable. Stable ventricle size and configuration. Stable gray-white matter differentiation throughout the brain. No new cortically based infarct identified. Vascular: Asymmetric hyperdensity of the right transverse and sigmoid sinuses. Corresponding to loss of enhancement and filling defect on MRI yesterday. Calcified atherosclerosis at the skull base. Skull: Stable and negative. Sinuses/Orbits: Stable. Widespread ethmoid mucosal thickening. Bilateral maxillary sinus bubbly opacity. Tympanic cavities and mastoids remain clear. Other: Stable orbit and scalp soft tissues. IMPRESSION: 1. Right transverse and sigmoid sinus thrombosis with acute right temporal lobe intra-axial hemorrhage and small/trace right subdural hematoma. 2. Stable parenchymal hemorrhage volume since the MRI 07/09/2017, estimated at 37 mL. Stable surrounding edema and mild regional mass effect. 3. Stable trace right lateral subdural hematoma. No intraventricular hemorrhage extension or ventriculomegaly. No increasing intracranial  mass effect. Stable trace leftward midline shift. 4. No new No acute intracranial abnormality. Electronically Signed   By: Genevie Ann M.D.   On: 07/10/2017 08:39   Ct Head Wo Contrast  Result Date: 07/10/2017 CLINICAL DATA:  72 y/o M; 2 weeks of headache and 2 days of nausea, suspect intracranial hemorrhage. EXAM: CT HEAD WITHOUT CONTRAST  TECHNIQUE: Contiguous axial images were obtained from the base of the skull through the vertex without intravenous contrast. COMPARISON:  07/09/2017 MRI head FINDINGS: Brain: Right temporal lobe hematoma spanning 6.0 x 2.7 x 2.7 cm (volume = 23 cm^3)(AP x ML x CC series 5, image 41 and series 6, image 17). Surrounding edema and result in mass effect results in minimal right-sided uncal herniation, partial effacement of the right lateral ventricle, and 3 mm of right-to-left midline shift. There is a small volume of subarachnoid hemorrhage overlying the right temporal lobe and trace volume of hemorrhage within occipital horns of lateral ventricles likely due to redistribution. Findings are stable from prior MRI of the brain given differences in technique. No additional acute intracranial hemorrhage, large territory infarct, or focal mass effect is identified. Background of mild chronic microvascular ischemic changes and parenchymal volume loss of the brain. Vascular: No hyperdense vessel or unexpected calcification. Skull: Normal. Negative for fracture or focal lesion. Sinuses/Orbits: Moderate diffuse paranasal sinus mucosal thickening with aerosolized secretions in the maxillary sinuses. Normal aeration of mastoid air cells. Bilateral intra-ocular lens replacement. Other: None. IMPRESSION: 1. Stable mixed attenuation hematoma in the right temporal lobe measuring up to 6 cm, 23 cc. Stable mass effect with 3 mm right-to-left midline shift. Stable small volume subarachnoid hemorrhage over right convexity and trace intraventricular hemorrhage likely due to redistribution. 2. No new acute intracranial abnormality identified. These results were called by telephone at the time of interpretation on 07/10/2017 at 2:14 am to Dr. Kerney Elbe , who verbally acknowledged these results. Electronically Signed   By: Kristine Garbe M.D.   On: 07/10/2017 02:13   Mr Jeri Cos XF Contrast  Result Date: 07/09/2017 CLINICAL  DATA:  Initial evaluation for persistent headaches for past 2 weeks EXAM: MRI HEAD WITHOUT AND WITH CONTRAST TECHNIQUE: Multiplanar, multiecho pulse sequences of the brain and surrounding structures were obtained without and with intravenous contrast. CONTRAST:  20 cc of MultiHance. COMPARISON:  None available. FINDINGS: Brain: Study moderately degraded by motion artifact. Generalized age-related cerebral atrophy. Patchy T2/FLAIR hyperintensity within the periventricular and deep white matter both cerebral hemispheres, most consistent with chronic small vessel ischemic disease, mild in nature. There is an acute to subacute appearing intraparenchymal hematoma centered at the peripheral right temporal lobe measuring approximately 6.1 x 3.1 x 3.8 cm (series 8, image 11). Surrounding rim of vasogenic edema with mild regional mass effect. Associated small volume subarachnoid hemorrhage within the adjacent right frontotemporal region, evident on SWI sequence (series 10, image 52). Additionally, evidence for extra-axial extension with a in 3 mm right holo hemispheric subdural hematoma. Trace layering blood products within the occipital horns of both lateral ventricles, suspected to be related to redistribution (series 10, image 33). Mild mass effect on the right lateral ventricle which is partially attenuated. Trace 3 mm right-to-left shift. No hydrocephalus or ventricular trapping. Basilar cisterns are patent. Scattered leptomeningeal enhancement within the right frontotemporal region likely reactive. No discernible mass lesion seen underlying the intraparenchymal hematoma. Abnormal flow void within the right transverse and sigmoid sinus, suspicious for possible venous sinus thrombosis (series 7, image 8). No evidence for acute  infarct. Gray-white matter differentiation otherwise maintained. No other encephalomalacia to suggest chronic infarction. No other mass lesion. Pituitary gland within normal limits. Midline  structures intact and normal. Vascular: Major arterial vascular flow voids maintained at the skull base. Skull and upper cervical spine: Craniocervical junction within normal limits. Upper cervical spine normal. Bone marrow signal intensity within normal limits. No scalp soft tissue abnormality. Sinuses/Orbits: Globes and orbital soft tissues within normal limits. Patient status post lens extraction bilaterally. Scattered mucosal thickening within the maxillary sinuses and ethmoidal air cells. Trace bilateral mastoid effusions noted. Other: None. IMPRESSION: 1. 6.1 x 3.1 x 3.8 cm acute to subacute intraparenchymal hematoma positioned within the anterior right temporal lobe. Associated edema with regional mass effect with trace 3 mm right-to-left midline shift. 2. Associated small volume subarachnoid hemorrhage within the adjacent right frontotemporal region. Trace intraventricular blood products suspected to be related to redistribution. 3. Evidence for intra-axial extension with small 3 mm right holo hemispheric subdural hematoma. 4. Abnormal flow void within the right transverse and sigmoid sinuses, suspicious for venous sinus thrombosis. Findings suspected to be the underlying etiology for the intraparenchymal hemorrhage. 5. Mild chronic small vessel ischemic disease. Critical Value/emergent results were called by telephone at the time of interpretation on 07/09/2017 at 9:19 pm to Dr. Tomi Bamberger , who verbally acknowledged these results. Electronically Signed   By: Jeannine Boga M.D.   On: 07/09/2017 21:20    Labs:  CBC: Recent Labs    07/09/17 2049  WBC 9.8  HGB 17.5*  HCT 51.6  PLT 209    COAGS: Recent Labs    07/09/17 2049  INR 1.07  APTT 27    BMP: Recent Labs    07/09/17 2049  NA 138  K 4.1  CL 103  CO2 22  GLUCOSE 134*  BUN 15  CALCIUM 8.9  CREATININE 0.84  GFRNONAA >60  GFRAA >60    LIVER FUNCTION TESTS: No results for input(s): BILITOT, AST, ALT, ALKPHOS, PROT,  ALBUMIN in the last 8760 hours.  TUMOR MARKERS: No results for input(s): AFPTM, CEA, CA199, CHROMGRNA in the last 8760 hours.  Assessment and Plan: Subacute right temporal lobe hemorrhage in conjunction with a small amount of adjacent subdural and subarachnoid hemorrhage, as well as right transverse and sigmoid sinus thrombosis. NIR consulted for possible angiogram vs. Intervention.  He has not been started on heparin. Patient continues with severe headache.  Case reviewed by Dr. Estanislado Pandy who recommends proceeding with diagnostic angiogram.  He does have a contrast allergy and will require emergent pre-medication which has been ordered.  He has been NPO.  INR 1.07 Scr 0.84  Risks and benefits were discussed with the patient including, but not limited to bleeding, infection, vascular injury or contrast induced renal failure.  This interventional procedure involves the use of X-rays and because of the nature of the planned procedure, it is possible that we will have prolonged use of X-ray fluoroscopy.  Potential radiation risks to you include (but are not limited to) the following: - A slightly elevated risk for cancer  several years later in life. This risk is typically less than 0.5% percent. This risk is low in comparison to the normal incidence of human cancer, which is 33% for women and 50% for men according to the [place]. - Radiation induced injury can include skin redness, resembling a rash, tissue breakdown / ulcers and hair loss (which can be temporary or permanent).   The likelihood of either of these occurring depends  on the difficulty of the procedure and whether you are sensitive to radiation due to previous procedures, disease, or genetic conditions.   IF your procedure requires a prolonged use of radiation, you will be notified and given written instructions for further action.  It is your responsibility to monitor the irradiated area for the 2 weeks  following the procedure and to notify your physician if you are concerned that you have suffered a radiation induced injury.    All of the patient's questions were answered, patient is agreeable to proceed.  Consent signed and in chart.   Thank you for this interesting consult.  I greatly enjoyed meeting Edmundo A Viens and look forward to participating in their care.  A copy of this report was sent to the requesting provider on this date.  Electronically Signed: Docia Barrier, PA 07/10/2017, 10:43 AM   I spent a total of 40 Minutes    in face to face in clinical consultation, greater than 50% of which was counseling/coordinating care for subdural hematoma, subarachnoid hemorrhage.

## 2017-07-10 NOTE — ED Notes (Signed)
Report given to 29M, RN.

## 2017-07-11 ENCOUNTER — Inpatient Hospital Stay (HOSPITAL_COMMUNITY): Payer: Medicare Other

## 2017-07-11 ENCOUNTER — Encounter (HOSPITAL_COMMUNITY): Payer: Self-pay | Admitting: Interventional Radiology

## 2017-07-11 ENCOUNTER — Other Ambulatory Visit: Payer: Medicare Other

## 2017-07-11 DIAGNOSIS — I5030 Unspecified diastolic (congestive) heart failure: Secondary | ICD-10-CM

## 2017-07-11 DIAGNOSIS — I5189 Other ill-defined heart diseases: Secondary | ICD-10-CM

## 2017-07-11 DIAGNOSIS — E785 Hyperlipidemia, unspecified: Secondary | ICD-10-CM

## 2017-07-11 DIAGNOSIS — I517 Cardiomegaly: Secondary | ICD-10-CM

## 2017-07-11 HISTORY — DX: Other ill-defined heart diseases: I51.89

## 2017-07-11 HISTORY — DX: Cardiomegaly: I51.7

## 2017-07-11 LAB — LIPID PANEL
CHOLESTEROL: 159 mg/dL (ref 0–200)
HDL: 36 mg/dL — ABNORMAL LOW (ref >40)
LDL Cholesterol: 107 mg/dL — ABNORMAL HIGH (ref 0–99)
TRIGLYCERIDES: 78 mg/dL (ref <150)
Total CHOL/HDL Ratio: 4.4 RATIO
VLDL: 16 mg/dL (ref 0–40)

## 2017-07-11 LAB — BASIC METABOLIC PANEL
ANION GAP: 9 (ref 5–15)
BUN: 25 mg/dL — ABNORMAL HIGH (ref 6–20)
CALCIUM: 8.4 mg/dL — AB (ref 8.9–10.3)
CO2: 21 mmol/L — AB (ref 22–32)
Chloride: 109 mmol/L (ref 101–111)
Creatinine, Ser: 0.96 mg/dL (ref 0.61–1.24)
GFR calc Af Amer: >60 mL/min (ref >60)
GFR calc non Af Amer: >60 mL/min (ref >60)
GLUCOSE: 110 mg/dL — AB (ref 65–99)
Potassium: 3.7 mmol/L (ref 3.5–5.1)
Sodium: 139 mmol/L (ref 135–145)

## 2017-07-11 LAB — ECHOCARDIOGRAM COMPLETE
AOASC: 30 cm
CHL CUP DOP CALC LVOT VTI: 19.3 cm
CHL CUP MV DEC (S): 158
E decel time: 158 msec
E/e' ratio: 9.82
FS: 30 % (ref 28–44)
HEIGHTINCHES: 75 in
LA ID, A-P, ES: 38 mm
LA diam end sys: 38 mm
LA vol A4C: 39.1 ml
LADIAMINDEX: 1.54 cm/m2
LAVOL: 47.2 mL
LAVOLIN: 19.1 mL/m2
LV E/e' medial: 9.82
LV e' LATERAL: 7.72 cm/s
LVEEAVG: 9.82
LVOT peak grad rest: 3 mmHg
LVOTPV: 80.3 cm/s
MV Peak grad: 2 mmHg
MVPKAVEL: 87 m/s
MVPKEVEL: 75.8 m/s
RV LATERAL S' VELOCITY: 10.8 cm/s
TAPSE: 27.5 mm
TDI e' lateral: 7.72
TDI e' medial: 8.05
WEIGHTICAEL: 4208 [oz_av]

## 2017-07-11 LAB — CBC
HCT: 47.2 % (ref 39.0–52.0)
HEMOGLOBIN: 16.2 g/dL (ref 13.0–17.0)
MCH: 29 pg (ref 26.0–34.0)
MCHC: 34.3 g/dL (ref 30.0–36.0)
MCV: 84.6 fL (ref 78.0–100.0)
PLATELETS: 227 10*3/uL (ref 150–400)
RBC: 5.58 MIL/uL (ref 4.22–5.81)
RDW: 13.2 % (ref 11.5–15.5)
WBC: 11.5 10*3/uL — AB (ref 4.0–10.5)

## 2017-07-11 LAB — HEPARIN LEVEL (UNFRACTIONATED)
HEPARIN UNFRACTIONATED: 0.21 [IU]/mL — AB (ref 0.30–0.70)
Heparin Unfractionated: 0.91 IU/mL — ABNORMAL HIGH (ref 0.30–0.70)

## 2017-07-11 LAB — HEMOGLOBIN A1C
Hgb A1c MFr Bld: 5.3 % (ref 4.8–5.6)
Mean Plasma Glucose: 105.41 mg/dL

## 2017-07-11 LAB — PROTEIN S ACTIVITY: Protein S Activity: 72 % (ref 63–140)

## 2017-07-11 LAB — HOMOCYSTEINE: Homocysteine: 13.1 umol/L (ref 0.0–15.0)

## 2017-07-11 LAB — PROTEIN S, TOTAL: Protein S Ag, Total: 88 % (ref 60–150)

## 2017-07-11 LAB — PROTEIN C ACTIVITY: Protein C Activity: 107 % (ref 73–180)

## 2017-07-11 MED ORDER — ATORVASTATIN CALCIUM 10 MG PO TABS
20.0000 mg | ORAL_TABLET | Freq: Every day | ORAL | Status: DC
  Administered 2017-07-11 – 2017-07-13 (×3): 20 mg via ORAL
  Filled 2017-07-11 (×2): qty 1
  Filled 2017-07-11: qty 2
  Filled 2017-07-11: qty 1

## 2017-07-11 MED ORDER — LABETALOL HCL 5 MG/ML IV SOLN: 10.0000 mg | INTRAVENOUS | Status: DC | PRN

## 2017-07-11 MED ORDER — IOPAMIDOL (ISOVUE-300) INJECTION 61%
INTRAVENOUS | Status: AC
  Administered 2017-07-11: 100 mL
  Filled 2017-07-11: qty 100

## 2017-07-11 MED ORDER — GUAIFENESIN-DM 100-10 MG/5ML PO SYRP
5.0000 mL | ORAL_SOLUTION | Freq: Four times a day (QID) | ORAL | Status: DC | PRN
  Administered 2017-07-11 – 2017-07-13 (×5): 5 mL via ORAL
  Filled 2017-07-11 (×7): qty 5

## 2017-07-11 MED ORDER — IOPAMIDOL (ISOVUE-300) INJECTION 61%
INTRAVENOUS | Status: AC
  Administered 2017-07-11: 11:00:00
  Filled 2017-07-11: qty 30

## 2017-07-11 NOTE — Progress Notes (Signed)
[place] for Heparin Indication: Sinus Venous Thrombosis  Allergies  Allergen Reactions  . Iodinated Diagnostic Agents Rash    Patient Measurements: Height: 6\' 3"  (190.5 cm) Weight: 263 lb (119.3 kg) IBW/kg (Calculated) : 84.5 Heparin Dosing Weight: 109 kg  Vital Signs: Temp: 98.6 F (37 C) (02/01 1501) Temp Source: Oral (02/01 1501) BP: 137/81 (02/01 1500) Pulse Rate: 83 (02/01 1500)  Labs: Recent Labs    07/09/17 2049 07/10/17 2048 07/11/17 0303 07/11/17 1426  HGB 17.5*  --  16.2  --   HCT 51.6  --  47.2  --   PLT 209  --  227  --   APTT 27  --   --   --   LABPROT 13.9  --   --   --   INR 1.07  --   --   --   HEPARINUNFRC  --  0.38 0.21* 0.91*  CREATININE 0.84  --  0.96  --     Estimated Creatinine Clearance: 95.4 mL/min (by C-G formula based on SCr of 0.96 mg/dL).   Assessment: 72 y.o. Male presented with subacute right temporal lobe hemorrhage complicated by sinus thrombosis.  He is not on anticoagulation prior to admission.  Patient went to IR with Dr. Estanislado Pandy for 4 vessel and RT ECA arteriogram which showed occluded RT transverse and RT sigmoid sinus with poorly opacified RT internal juglular vein along with prolonged clearance of contrast from RT cerebral hemisphere.  Post-IR discussed with Dr. Erlinda Hong. Madaline Brilliant to start heparin with NO BOLUS and low goal rate.  2/1 AM: heparin level 0.2 < goal and rate increased 1950 uts/hr > HL now 0.9 large unexpected jump.  HL drawn correctly in opposite arm.  Nurse changed IV site earlier today maybe running better.  Will make small change and recheck HL tonight.  Goals of Therapy: Heparin level 0.3-0.5units/mL  Plan: No bolus Inc heparin to 1900 units/hr 2300 heparin level  Bonnita Nasuti Pharm.D. CPP, BCPS Clinical Pharmacist [phone] 07/11/2017 6:46 PM

## 2017-07-11 NOTE — Progress Notes (Signed)
STROKE TEAM PROGRESS NOTE   SUBJECTIVE (INTERVAL HISTORY) His wife, daughter and sons are at the bedside.  Pt awake alert orientated. On heparin low intensity now and no neuro changes. Will do pan CT to rule out malignancy. Heparin level monitored by pharmacy.   OBJECTIVE Temp:  [98.2 F (36.8 C)-99.1 F (37.3 C)] 98.8 F (37.1 C) (02/01 0723) Pulse Rate:  [47-102] 79 (02/01 0915) Cardiac Rhythm: Normal sinus rhythm (02/01 0744) Resp:  [12-30] 16 (02/01 0915) BP: (76-160)/(41-84) 147/81 (02/01 0915) SpO2:  [89 %-98 %] 94 % (02/01 0915)  Recent Labs  Lab 07/10/17 1415  GLUCAP 111*   Recent Labs  Lab 07/09/17 2049 07/11/17 0303  NA 138 139  K 4.1 3.7  CL 103 109  CO2 22 21*  GLUCOSE 134* 110*  BUN 15 25*  CREATININE 0.84 0.96  CALCIUM 8.9 8.4*   No results for input(s): AST, ALT, ALKPHOS, BILITOT, PROT, ALBUMIN in the last 168 hours. Recent Labs  Lab 07/09/17 2049 07/11/17 0303  WBC 9.8 11.5*  NEUTROABS 8.4*  --   HGB 17.5* 16.2  HCT 51.6 47.2  MCV 86.4 84.6  PLT 209 227   No results for input(s): CKTOTAL, CKMB, CKMBINDEX, TROPONINI in the last 168 hours. Recent Labs    07/09/17 2049  LABPROT 13.9  INR 1.07   No results for input(s): COLORURINE, LABSPEC, PHURINE, GLUCOSEU, HGBUR, BILIRUBINUR, KETONESUR, PROTEINUR, UROBILINOGEN, NITRITE, LEUKOCYTESUR in the last 72 hours.  Invalid input(s): APPERANCEUR     Component Value Date/Time   CHOL 159 07/11/2017 0303   TRIG 78 07/11/2017 0303   HDL 36 (L) 07/11/2017 0303   CHOLHDL 4.4 07/11/2017 0303   VLDL 16 07/11/2017 0303   LDLCALC 107 (H) 07/11/2017 0303   Lab Results  Component Value Date   HGBA1C 5.3 07/11/2017   No results found for: LABOPIA, COCAINSCRNUR, LABBENZ, AMPHETMU, THCU, LABBARB  No results for input(s): ETH in the last 168 hours.  I have personally reviewed the radiological images below and agree with the radiology interpretations.  Ct Head Wo Contrast  Result Date:  07/10/2017 CLINICAL DATA:  72 year old male with right temporal lobe intra-axial hemorrhage diagnosed yesterday after presentation with persistent headache. EXAM: CT HEAD WITHOUT CONTRAST TECHNIQUE: Contiguous axial images were obtained from the base of the skull through the vertex without intravenous contrast. COMPARISON:  Head CT without contrast 0152 hr today. Brain MRI 07/09/2017. FINDINGS: Brain: Mostly hyperdense intra-axial hemorrhage within the right temporal lobe centrally and involving the inferior middle and superior temporal gyri. Hemorrhage encompasses 62 by 31 by 38 millimeters (AP by transverse by CC) for an estimated blood volume of 37 milliliters -unchanged from the 07/09/2017 MRI. Surrounding edema and regional mass effect remain stable. No intraventricular extension. The small 2-3 millimeter superimposed right subdural hematoma more apparent by MRI is stable. Stable trace leftward midline shift. Basilar cisterns remain stable. Stable ventricle size and configuration. Stable gray-white matter differentiation throughout the brain. No new cortically based infarct identified. Vascular: Asymmetric hyperdensity of the right transverse and sigmoid sinuses. Corresponding to loss of enhancement and filling defect on MRI yesterday. Calcified atherosclerosis at the skull base. Skull: Stable and negative. Sinuses/Orbits: Stable. Widespread ethmoid mucosal thickening. Bilateral maxillary sinus bubbly opacity. Tympanic cavities and mastoids remain clear. Other: Stable orbit and scalp soft tissues. IMPRESSION: 1. Right transverse and sigmoid sinus thrombosis with acute right temporal lobe intra-axial hemorrhage and small/trace right subdural hematoma. 2. Stable parenchymal hemorrhage volume since the MRI 07/09/2017, estimated at 37 mL.  Stable surrounding edema and mild regional mass effect. 3. Stable trace right lateral subdural hematoma. No intraventricular hemorrhage extension or ventriculomegaly. No  increasing intracranial mass effect. Stable trace leftward midline shift. 4. No new No acute intracranial abnormality. Electronically Signed   By: Genevie Ann M.D.   On: 07/10/2017 08:39   Ct Head Wo Contrast  Result Date: 07/10/2017 CLINICAL DATA:  72 y/o M; 2 weeks of headache and 2 days of nausea, suspect intracranial hemorrhage. EXAM: CT HEAD WITHOUT CONTRAST TECHNIQUE: Contiguous axial images were obtained from the base of the skull through the vertex without intravenous contrast. COMPARISON:  07/09/2017 MRI head FINDINGS: Brain: Right temporal lobe hematoma spanning 6.0 x 2.7 x 2.7 cm (volume = 23 cm^3)(AP x ML x CC series 5, image 41 and series 6, image 17). Surrounding edema and result in mass effect results in minimal right-sided uncal herniation, partial effacement of the right lateral ventricle, and 3 mm of right-to-left midline shift. There is a small volume of subarachnoid hemorrhage overlying the right temporal lobe and trace volume of hemorrhage within occipital horns of lateral ventricles likely due to redistribution. Findings are stable from prior MRI of the brain given differences in technique. No additional acute intracranial hemorrhage, large territory infarct, or focal mass effect is identified. Background of mild chronic microvascular ischemic changes and parenchymal volume loss of the brain. Vascular: No hyperdense vessel or unexpected calcification. Skull: Normal. Negative for fracture or focal lesion. Sinuses/Orbits: Moderate diffuse paranasal sinus mucosal thickening with aerosolized secretions in the maxillary sinuses. Normal aeration of mastoid air cells. Bilateral intra-ocular lens replacement. Other: None. IMPRESSION: 1. Stable mixed attenuation hematoma in the right temporal lobe measuring up to 6 cm, 23 cc. Stable mass effect with 3 mm right-to-left midline shift. Stable small volume subarachnoid hemorrhage over right convexity and trace intraventricular hemorrhage likely due to  redistribution. 2. No new acute intracranial abnormality identified. These results were called by telephone at the time of interpretation on 07/10/2017 at 2:14 am to Dr. Kerney Elbe , who verbally acknowledged these results. Electronically Signed   By: Kristine Garbe M.D.   On: 07/10/2017 02:13   Mr Virgel Paling VQ Contrast  Result Date: 07/10/2017 CLINICAL DATA:  Right temporal lobe intraparenchymal hemorrhage with suspicion of venous thrombosis. EXAM: MRA head without CONTRAST MRV HEAD WITHOUT CONTRAST TECHNIQUE: Multiplanar, multiecho pulse sequences of the brain and surrounding structures were obtained with intravenous contrast. Angiographic images of the intracranial venous structures were obtained using MRV technique without intravenous contrast. COMPARISON:  MRI 07/09/2017.  CT studies earlier same day. FINDINGS: MRA HEAD FINDINGS Some motion degradation. Both internal carotid arteries are widely patent through the skull base and siphon regions. Left anterior and middle cerebral vessels are normal without proximal stenosis, aneurysm or vascular malformation. On the right, the anterior cerebral vessels appear normal. The right M1 segment is patent. There is distortion of the right middle cerebral artery branches secondary to the intraparenchymal hematoma. I do not suspect a primary arterial abnormality. No sign of aneurysm or high flow vascular malformation. Posterior circulation vessels appear normal. Both vertebral arteries widely patent to the basilar. No basilar stenosis. Posterior circulation branch vessels appear normal, with some motion degradation. MRV HEAD FINDINGS Superior sagittal sinus is patent. Deep veins are patent. Left transverse sinus, sigmoid sinus and jugular vein are patent. There thrombosis of the right transverse sinus, sigmoid sinus and jugular vein. IMPRESSION: MR venography confirmed thrombosis of the right transverse sinus, sigmoid sinus and jugular vein. Superior  sagittal  sinus, deep venous system and left sided drainage are patent. MR angiography does not show any primary arterial side pathology. There is diminished visualization of the right MCA branches due to the regional hematoma. No sign of aneurysm or high flow vascular abnormality. Electronically Signed   By: Nelson Chimes M.D.   On: 07/10/2017 11:04   Mr Jeri Cos ZM Contrast  Result Date: 07/09/2017 CLINICAL DATA:  Initial evaluation for persistent headaches for past 2 weeks EXAM: MRI HEAD WITHOUT AND WITH CONTRAST TECHNIQUE: Multiplanar, multiecho pulse sequences of the brain and surrounding structures were obtained without and with intravenous contrast. CONTRAST:  20 cc of MultiHance. COMPARISON:  None available. FINDINGS: Brain: Study moderately degraded by motion artifact. Generalized age-related cerebral atrophy. Patchy T2/FLAIR hyperintensity within the periventricular and deep white matter both cerebral hemispheres, most consistent with chronic small vessel ischemic disease, mild in nature. There is an acute to subacute appearing intraparenchymal hematoma centered at the peripheral right temporal lobe measuring approximately 6.1 x 3.1 x 3.8 cm (series 8, image 11). Surrounding rim of vasogenic edema with mild regional mass effect. Associated small volume subarachnoid hemorrhage within the adjacent right frontotemporal region, evident on SWI sequence (series 10, image 52). Additionally, evidence for extra-axial extension with a in 3 mm right holo hemispheric subdural hematoma. Trace layering blood products within the occipital horns of both lateral ventricles, suspected to be related to redistribution (series 10, image 33). Mild mass effect on the right lateral ventricle which is partially attenuated. Trace 3 mm right-to-left shift. No hydrocephalus or ventricular trapping. Basilar cisterns are patent. Scattered leptomeningeal enhancement within the right frontotemporal region likely reactive. No discernible mass  lesion seen underlying the intraparenchymal hematoma. Abnormal flow void within the right transverse and sigmoid sinus, suspicious for possible venous sinus thrombosis (series 7, image 8). No evidence for acute infarct. Gray-white matter differentiation otherwise maintained. No other encephalomalacia to suggest chronic infarction. No other mass lesion. Pituitary gland within normal limits. Midline structures intact and normal. Vascular: Major arterial vascular flow voids maintained at the skull base. Skull and upper cervical spine: Craniocervical junction within normal limits. Upper cervical spine normal. Bone marrow signal intensity within normal limits. No scalp soft tissue abnormality. Sinuses/Orbits: Globes and orbital soft tissues within normal limits. Patient status post lens extraction bilaterally. Scattered mucosal thickening within the maxillary sinuses and ethmoidal air cells. Trace bilateral mastoid effusions noted. Other: None. IMPRESSION: 1. 6.1 x 3.1 x 3.8 cm acute to subacute intraparenchymal hematoma positioned within the anterior right temporal lobe. Associated edema with regional mass effect with trace 3 mm right-to-left midline shift. 2. Associated small volume subarachnoid hemorrhage within the adjacent right frontotemporal region. Trace intraventricular blood products suspected to be related to redistribution. 3. Evidence for intra-axial extension with small 3 mm right holo hemispheric subdural hematoma. 4. Abnormal flow void within the right transverse and sigmoid sinuses, suspicious for venous sinus thrombosis. Findings suspected to be the underlying etiology for the intraparenchymal hemorrhage. 5. Mild chronic small vessel ischemic disease. Critical Value/emergent results were called by telephone at the time of interpretation on 07/09/2017 at 9:19 pm to Dr. Tomi Bamberger , who verbally acknowledged these results. Electronically Signed   By: Jeannine Boga M.D.   On: 07/09/2017 21:20   Mr Mrv  Head Wo Cm  Result Date: 07/10/2017 CLINICAL DATA:  Right temporal lobe intraparenchymal hemorrhage with suspicion of venous thrombosis. EXAM: MRA head without CONTRAST MRV HEAD WITHOUT CONTRAST TECHNIQUE: Multiplanar, multiecho pulse sequences of the brain and  surrounding structures were obtained with intravenous contrast. Angiographic images of the intracranial venous structures were obtained using MRV technique without intravenous contrast. COMPARISON:  MRI 07/09/2017.  CT studies earlier same day. FINDINGS: MRA HEAD FINDINGS Some motion degradation. Both internal carotid arteries are widely patent through the skull base and siphon regions. Left anterior and middle cerebral vessels are normal without proximal stenosis, aneurysm or vascular malformation. On the right, the anterior cerebral vessels appear normal. The right M1 segment is patent. There is distortion of the right middle cerebral artery branches secondary to the intraparenchymal hematoma. I do not suspect a primary arterial abnormality. No sign of aneurysm or high flow vascular malformation. Posterior circulation vessels appear normal. Both vertebral arteries widely patent to the basilar. No basilar stenosis. Posterior circulation branch vessels appear normal, with some motion degradation. MRV HEAD FINDINGS Superior sagittal sinus is patent. Deep veins are patent. Left transverse sinus, sigmoid sinus and jugular vein are patent. There thrombosis of the right transverse sinus, sigmoid sinus and jugular vein. IMPRESSION: MR venography confirmed thrombosis of the right transverse sinus, sigmoid sinus and jugular vein. Superior sagittal sinus, deep venous system and left sided drainage are patent. MR angiography does not show any primary arterial side pathology. There is diminished visualization of the right MCA branches due to the regional hematoma. No sign of aneurysm or high flow vascular abnormality. Electronically Signed   By: Nelson Chimes M.D.    On: 07/10/2017 11:04   Ir Angio Intra Extracran Sel Internal Carotid Bilat Mod Sed  Result Date: 07/11/2017 CLINICAL DATA:  Worsening headaches. CT scan revealing right temporal hematoma. Thrombus in the right transverse sinus and right sigmoid sinus EXAM: IR ANGIO INTRA EXTRACRAN SEL INTERNAL CAROTID BILAT MOD SED MEDICATIONS: Heparin 1000 units IV; Marland Kitchen The antibiotic was administered within 1 hour of the procedure ANESTHESIA/SEDATION: Versed  mg IV; Fentanyl  mcg IV Moderate Sedation Time:   minutes The patient was continuously monitored during the procedure by the interventional radiology nurse under my direct supervision. FLUOROSCOPY TIME:  Fluoroscopy Time:  minutes  seconds ( mGy). COMPLICATIONS: None immediate. TECHNIQUE: Informed written consent was obtained from the patient after a thorough discussion of the procedural risks, benefits and alternatives. All questions were addressed. Maximal Sterile Barrier Technique was utilized including caps, mask, sterile gowns, sterile gloves, sterile drape, hand hygiene and skin antiseptic. A timeout was performed prior to the initiation of the procedure. The right groin was prepped and draped in the usual sterile fashion. Thereafter using modified Seldinger technique, transfemoral access into the right common femoral artery was obtained without difficulty. Over a 0.035 inch guidewire, a 5 French Pinnacle sheath was inserted. Through this, and also over 0.035 inch guidewire, a 5 [place] JB 1 catheter was advanced to the aortic arch region and selectively positioned in the right common carotid artery, the right external carotid artery, the right vertebral artery, the left common carotid artery and the left vertebral artery. FINDINGS: Right CCA: The right common carotid arteriogram demonstrates the right external carotid artery and its major branches to be widely patent. Right ICA: The right internal carotid artery at the bulb to the cranial skull base opacifies normally.  Right ECA: The selective right external carotid artery injection demonstrates normal opacification of the right external carotid artery branches. Right Intracranial: The right middle cerebral artery and the right anterior cerebral artery opacify normally into the capillary and venous phases. Transient cross filling via the anterior communicating artery of the left anterior cerebral artery A2  segment is noted. Right Sided Anterior Venous: The petrous, cavernous and supraclinoid segments are widely patent. The venous phase demonstrates preferential egress of contrast into the right transverse sinus, the sigmoid sinus and the right internal jugular vein. There is no early venous shunting into the venous structures at the skull base or intracranially into the dural sinuses. Right Vertebral Extracranial: The origin of the right vertebral artery is normal. The vessel is seen to opacify normally to the cranial skull base. Normal opacification is seen of the right vertebrobasilar junction and the right posterior-inferior cerebellar artery. Right Vertebral Intracranial: The opacified portion of the basilar artery, the right posterior cerebral artery, the superior cerebellar arteries and the anterior-inferior cerebellar arteries is normal into the capillary and venous phases. Non-opacified blood is seen in the basilar artery from the contralateral vertebral artery. Right Sided Posterior Venous: The right transverse sinus and the sigmoid sinus are normal and widely patent. ________________________________________________________ Left CCA: The left common carotid arteriogram demonstrates the left external carotid artery and its major branches to be widely patent. Left ICA: The left internal carotid artery at the bulb to the cranial skull base opacifies normally. Left ECA: The selective left external carotid artery injection demonstrates normal opacification of the left external carotid artery branches. Left Intracranial: The  left middle cerebral artery and the left anterior cerebral artery opacify normally into the capillary and venous phases. Transient cross filling via the anterior communicating artery of the right anterior cerebral artery A2 segment is noted. Left Sided Anterior Venous: The petrous, cavernous and supraclinoid segments are widely patent. The venous phase demonstrates preferential egress of contrast into the left transverse sinus, the sigmoid sinus and the left internal jugular vein. There is no early venous shunting into the venous structures at the skull base or intracranially into the dural sinuses. Left Vertebral Extracranial: The origin of the left vertebral artery is normal. The vessel is seen to opacify normally to the cranial skull base. Normal opacification is seen of the left vertebrobasilar junction and the lfet posterior-inferior cerebellar artery. Left Vertebral Intracranial: The opacified portion of the basilar artery, the left posterior cerebral artery, the superior cerebellar arteries and the anterior-inferior cerebellar arteries is normal into the capillary and venous phases. Non-opacified blood is seen in the basilar artery from the contralateral vertebral artery. Left Sided Posterior Venous: The left transverse sinus and the sigmoid sinus are normal and widely patent. Electronically Signed   By: Luanne Bras M.D.   On: 07/11/2017 09:12   Ir Angio Vertebral Sel Vertebral Bilat Mod Sed  Result Date: 07/11/2017 CLINICAL DATA:  Progressive headaches. Right temporal hemorrhage. Thrombosis within the right transverse sinus and right sigmoid sinus. EXAM: IR ANGIO EXTERNAL CAROTID SEL EXT CAROTID UNI RIGHT MOD SED COMPARISON:  CT of the head, and MRI of the brain of 07/10/2017. MEDICATIONS: No heparin was given. No antibiotic was administered within 1 hour of the procedure. ANESTHESIA/SEDATION: Versed 1 mg IV; Fentanyl 50 mcg IV. Moderate Sedation Time:  60 minutes. The patient was continuously  monitored during the procedure by the interventional radiology nurse under my direct supervision. CONTRAST:  Isovue 300 approximately 80 cc. FLUOROSCOPY TIME:  Fluoroscopy Time: 13 minutes 54 seconds (3213 mGy). COMPLICATIONS: None immediate. TECHNIQUE: Informed written consent was obtained from the patient after a thorough discussion of the procedural risks, benefits and alternatives. All questions were addressed. Maximal Sterile Barrier Technique was utilized including caps, mask, sterile gowns, sterile gloves, sterile drape, hand hygiene and skin antiseptic. A timeout was  performed prior to the initiation of the procedure. The right groin was prepped and draped in the usual sterile fashion. Thereafter using modified Seldinger technique, transfemoral access into the right common femoral artery was obtained without difficulty. Over a 0.035 inch guidewire, a 5 French Pinnacle sheath was inserted. Through this, and also over 0.035 inch guidewire, a 5 [place] JB 1 catheter was advanced to the aortic arch region and selectively positioned in the right common carotid artery, the right external carotid artery, the right vertebral artery, the left common carotid artery and the left vertebral artery. FINDINGS: The right common carotid arteriogram demonstrates the right external carotid artery and its major branches to be widely patent. A selective right external carotid arteriogram demonstrates no abnormal arteriovenous shunting between the extracranial arterial and venous systems, and also the dural venous drainage. The right internal carotid artery at the bulb to the cranial skull base opacifies normally. The petrous, cavernous and supraclinoid segments demonstrate wide patency. The right middle cerebral artery and the right anterior cerebral artery demonstrate wide patency. However, mass effect on the perisylvian triangle being pushed superiorly and somewhat medially secondary to the large right temporal hematoma. No  evidence of tumor blush, or of stasis of contrast is noted in this region. The venous phase demonstrates delayed clearance of contrast from the venules. The drainage on the venous side is primarily into the left transverse sinus. The right transverse sinus and the right sigmoid sinus do not opacify. There is a significantly attenuated right internal jugular vein. The right vertebral artery origin is widely patent. The vessel is seen to opacify normally to the cranial skull base. Wide patency is seen of the right posterior-cerebellar artery and the right vertebrorbasilar junction. The opacified portion of the basilar artery, the posterior cerebral arteries, superior cerebellar arteries and the anterior-inferior cerebellar arteries is noted into the capillary and venous phases. Non-opacified blood is seen in the basilar artery from the contralateral vertebral artery. The venous phase again demonstrates nonvisualization of the right transverse sinus and the right sigmoid sinus. Contrast is noted into the left transverse sinus, the left sigmoid sinus and the internal jugular vein. There appears to be stasis with prolonged clearance of the veins overlying the right cerebellar hemisphere underlying the tentorium cerebellum and the posterior aspect of the medial right occipital lobe. The left common carotid arteriogram demonstrates the left external carotid artery and its major branches to be widely patent. The left internal carotid artery at the bulb to the cranial skull base opacifies widely. The petrous, cavernous and supraclinoid segments demonstrate wide patency. A left posterior communicating artery is seen opacifying the left posterior cerebral artery distribution. There is approximately 50% stenosis of the mid M1 segment of the left middle cerebral artery. The MCA trifurcation branches and the left anterior cerebral artery, otherwise, opacify into the capillary and venous phases. Again demonstrated is  nonvisualization of the right transverse and sigmoid sinuses. The origin of left vertebral artery demonstrates patency though with mild tortuosity. The vessel, otherwise, is seen to opacify normally to the cranial skull base. The left posterior-inferior cerebellar artery and left vertebrobasilar junction demonstrate wide patency. The opacified portion of the basilar artery, the posterior cerebral arteries, the superior cerebellar arteries and the anterior-inferior cerebellar arteries progress into the capillary and venous phases. Again noted is nonvisualization of the right transverse sinus and the right sigmoid sinus. IMPRESSION: Angiographically nonvisualization of the right transverse sinus and sigmoid sinus with a severely attenuated caliber of the right internal jugular  vein. This is very suspicious related to dural sinus thrombosis. Slow egress of contrast overlying the right cortical cerebral hemisphere in the venous phase consistent with venous congestion, possibly related to the large temporal hematoma. Suspicion remains of a raised venous pressure overlying the right cerebral hemisphere. No angiographic evidence of extracranial or intracranial arteriovenous shunting at this time. PLAN: Findings reviewed with the patient's family and referring neurologist. Suggest repeat arteriogram in about 6-8 weeks to ensure no evidence of arteriovenous fistulous communications intracranially or extra cranially. And also to evaluate for evidence of recanalization of the occluded right transverse sinus and right sigmoid sinus. Electronically Signed   By: Luanne Bras M.D.   On: 07/10/2017 14:45   Ir Angio External Carotid Sel Ext Carotid Uni R Mod Sed  Result Date: 07/11/2017 CLINICAL DATA:  Progressive headaches. Right temporal hemorrhage. Thrombosis within the right transverse sinus and right sigmoid sinus. EXAM: IR ANGIO EXTERNAL CAROTID SEL EXT CAROTID UNI RIGHT MOD SED COMPARISON:  CT of the head, and MRI of  the brain of 07/10/2017. MEDICATIONS: No heparin was given. No antibiotic was administered within 1 hour of the procedure. ANESTHESIA/SEDATION: Versed 1 mg IV; Fentanyl 50 mcg IV. Moderate Sedation Time:  60 minutes. The patient was continuously monitored during the procedure by the interventional radiology nurse under my direct supervision. CONTRAST:  Isovue 300 approximately 80 cc. FLUOROSCOPY TIME:  Fluoroscopy Time: 13 minutes 54 seconds (3213 mGy). COMPLICATIONS: None immediate. TECHNIQUE: Informed written consent was obtained from the patient after a thorough discussion of the procedural risks, benefits and alternatives. All questions were addressed. Maximal Sterile Barrier Technique was utilized including caps, mask, sterile gowns, sterile gloves, sterile drape, hand hygiene and skin antiseptic. A timeout was performed prior to the initiation of the procedure. The right groin was prepped and draped in the usual sterile fashion. Thereafter using modified Seldinger technique, transfemoral access into the right common femoral artery was obtained without difficulty. Over a 0.035 inch guidewire, a 5 French Pinnacle sheath was inserted. Through this, and also over 0.035 inch guidewire, a 5 [place] JB 1 catheter was advanced to the aortic arch region and selectively positioned in the right common carotid artery, the right external carotid artery, the right vertebral artery, the left common carotid artery and the left vertebral artery. FINDINGS: The right common carotid arteriogram demonstrates the right external carotid artery and its major branches to be widely patent. A selective right external carotid arteriogram demonstrates no abnormal arteriovenous shunting between the extracranial arterial and venous systems, and also the dural venous drainage. The right internal carotid artery at the bulb to the cranial skull base opacifies normally. The petrous, cavernous and supraclinoid segments demonstrate wide patency.  The right middle cerebral artery and the right anterior cerebral artery demonstrate wide patency. However, mass effect on the perisylvian triangle being pushed superiorly and somewhat medially secondary to the large right temporal hematoma. No evidence of tumor blush, or of stasis of contrast is noted in this region. The venous phase demonstrates delayed clearance of contrast from the venules. The drainage on the venous side is primarily into the left transverse sinus. The right transverse sinus and the right sigmoid sinus do not opacify. There is a significantly attenuated right internal jugular vein. The right vertebral artery origin is widely patent. The vessel is seen to opacify normally to the cranial skull base. Wide patency is seen of the right posterior-cerebellar artery and the right vertebrorbasilar junction. The opacified portion of the basilar artery, the posterior  cerebral arteries, superior cerebellar arteries and the anterior-inferior cerebellar arteries is noted into the capillary and venous phases. Non-opacified blood is seen in the basilar artery from the contralateral vertebral artery. The venous phase again demonstrates nonvisualization of the right transverse sinus and the right sigmoid sinus. Contrast is noted into the left transverse sinus, the left sigmoid sinus and the internal jugular vein. There appears to be stasis with prolonged clearance of the veins overlying the right cerebellar hemisphere underlying the tentorium cerebellum and the posterior aspect of the medial right occipital lobe. The left common carotid arteriogram demonstrates the left external carotid artery and its major branches to be widely patent. The left internal carotid artery at the bulb to the cranial skull base opacifies widely. The petrous, cavernous and supraclinoid segments demonstrate wide patency. A left posterior communicating artery is seen opacifying the left posterior cerebral artery distribution. There is  approximately 50% stenosis of the mid M1 segment of the left middle cerebral artery. The MCA trifurcation branches and the left anterior cerebral artery, otherwise, opacify into the capillary and venous phases. Again demonstrated is nonvisualization of the right transverse and sigmoid sinuses. The origin of left vertebral artery demonstrates patency though with mild tortuosity. The vessel, otherwise, is seen to opacify normally to the cranial skull base. The left posterior-inferior cerebellar artery and left vertebrobasilar junction demonstrate wide patency. The opacified portion of the basilar artery, the posterior cerebral arteries, the superior cerebellar arteries and the anterior-inferior cerebellar arteries progress into the capillary and venous phases. Again noted is nonvisualization of the right transverse sinus and the right sigmoid sinus. IMPRESSION: Angiographically nonvisualization of the right transverse sinus and sigmoid sinus with a severely attenuated caliber of the right internal jugular vein. This is very suspicious related to dural sinus thrombosis. Slow egress of contrast overlying the right cortical cerebral hemisphere in the venous phase consistent with venous congestion, possibly related to the large temporal hematoma. Suspicion remains of a raised venous pressure overlying the right cerebral hemisphere. No angiographic evidence of extracranial or intracranial arteriovenous shunting at this time. PLAN: Findings reviewed with the patient's family and referring neurologist. Suggest repeat arteriogram in about 6-8 weeks to ensure no evidence of arteriovenous fistulous communications intracranially or extra cranially. And also to evaluate for evidence of recanalization of the occluded right transverse sinus and right sigmoid sinus. Electronically Signed   By: Luanne Bras M.D.   On: 07/10/2017 14:45   TTE pending  Pan CT pending   PHYSICAL EXAM  Temp:  [98.2 F (36.8 C)-99.1 F  (37.3 C)] 98.8 F (37.1 C) (02/01 0723) Pulse Rate:  [47-102] 79 (02/01 0915) Resp:  [12-30] 16 (02/01 0915) BP: (76-160)/(41-84) 147/81 (02/01 0915) SpO2:  [89 %-98 %] 94 % (02/01 0915)  General - Well nourished, well developed, in no apparent distress.  Ophthalmologic - fundi not visualized due to noncooperation.  Cardiovascular - Regular rate and rhythm with no murmur.  Mental Status: Alert, oriented, thought content appropriate.  Speech fluent without evidence of aphasia.  Able to follow all commands without difficulty. Cranial Nerves: II: Visual fields with subtle left homonymous inferior quadrantanopsia. PERRL.  III,IV, VI: ptosis not present, EOMI without nystagmus.  V,VII: No facial droop. Temp sensation normal bilaterally VIII: Hearing intact to conversation IX,X: Palate rises symmetrically XI: Symmetric XII: Midline tongue extension Motor: Right :  Upper extremity   5/5  Left:     Upper extremity   5/5             Lower extremity   5/5                                                  Lower extremity   5/5 Normal tone throughout; no atrophy noted Sensory: Temp and light touch intact x 4 Deep Tendon Reflexes: 2+ and symmetric throughout Plantars: Right: downgoing               Left: downgoing Cerebellar: No ataxia with FNF bilaterally Gait: Deferred  ASSESSMENT/PLAN [name] is a 72 y.o. male with history of prostate cancer, HTN, HLD, polycythemia, OSA admitted for right frontal headache and nausea. No tPA given due to [place].    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  Resultant HA much improved.  MRI  Right temporal ICH with likely right transverse sinus thrombosis  MRA  No AVM or aneurysm  MRV  thrombosis of the right  transverse sinus, sigmoid sinus and jugular vein  DSA - Occluded RT Transverse and RT sigmoid sinus and poorly opacified RT internal jugular vein.  2D Echo  Pending  Pan CT to rule out malignancy - 4hr preparation for contrast allergy  LDL 103  HgbA1c 5.3  Heparin IV for VTE prophylaxis Fall precautions  Diet Heart Room service appropriate? Yes; Fluid consistency: Thin   No antithrombotic prior to admission, now on heparin IV low intensity with heparin level 0.3-0.5 without bolus.   Ongoing aggressive stroke risk factor management  Therapy recommendations:  pending  Disposition:  Pending  ICH - right temporal due to venous thrombosis  Heparin IV, low intensity without bolus  Close neuro monitoring  BP goal < 160  If any neuro changes, stop heparin and stat head CT  Hypertension Stable BP goal < 160 On cleviprex Add amlodipine and lisinopril  Long term BP goal normotensive  Hyperlipidemia  Home meds:  None   LDL 103, goal < 70  Start lipitor 20mg  daily  Continue stain on discharge  Other Stroke Risk Factors  Advanced age  Obesity, Body mass index is 32.87 kg/m.   Obstructive sleep apnea, on CPAP at home  Polycythemia - Hb 17.5->17.5->16.2  Other Active Problems  Hx of prostate cancer   Hospital day # 1  This patient is critically ill due to venous thrombosis with ICH, cerebral edema, hypertension and at significant risk of neurological worsening, death form recurrent bleeding, hematoma expansion, thrombosis extension, hypertensive emergency, heart failure. This patient's care requires constant monitoring of vital signs, hemodynamics, respiratory and cardiac monitoring, review of multiple databases, neurological assessment, discussion with family, other specialists and medical decision making of high complexity. I had long discussion with wife at bedside and later over the phone, updated pt current condition, treatment plan and potential prognosis.  They expressed understanding and appreciation. I spent 35 minutes of neurocritical care time in the care of this patient.  Rosalin Hawking, MD PhD Stroke Neurology 07/11/2017 10:30 AM    To contact Stroke Continuity provider, please refer to [email or web address] After hours, contact General Neurology

## 2017-07-11 NOTE — Progress Notes (Signed)
PT Cancellation Note  Patient Details Name: Justin Adams MRN: [id number] DOB: 04/02/1946   Cancelled Treatment:    Reason Eval/Treat Not Completed: Patient not medically ready. Pt with bed rest orders. PT will continue to f/u with pt and await updated activity orders.    [place] 07/11/2017, 11:26 AM

## 2017-07-11 NOTE — Progress Notes (Signed)
[place] for Heparin Indication: Sinus Venous Thrombosis  Allergies  Allergen Reactions  . Iodinated Diagnostic Agents Rash    Patient Measurements: Height: 6\' 3"  (190.5 cm) Weight: 263 lb (119.3 kg) IBW/kg (Calculated) : 84.5 Heparin Dosing Weight: 109 kg  Vital Signs: Temp: 98.2 F (36.8 C) (02/01 0332) Temp Source: Oral (02/01 0332) BP: 133/75 (02/01 0345) Pulse Rate: 70 (02/01 0345)  Labs: Recent Labs    07/09/17 2049 07/10/17 2048 07/11/17 0303  HGB 17.5*  --  16.2  HCT 51.6  --  47.2  PLT 209  --  227  APTT 27  --   --   LABPROT 13.9  --   --   INR 1.07  --   --   HEPARINUNFRC  --  0.38 0.21*  CREATININE 0.84  --  0.96    Estimated Creatinine Clearance: 95.4 mL/min (by C-G formula based on SCr of 0.96 mg/dL).   Assessment: 72 y.o. Male presented with subacute right temporal lobe hemorrhage complicated by sinus thrombosis.  He is not on anticoagulation prior to admission.  Patient went to IR with Dr. Estanislado Pandy for 4 vessel and RT ECA arteriogram which showed occluded RT transverse and RT sigmoid sinus with poorly opacified RT internal juglular vein along with prolonged clearance of contrast from RT cerebral hemisphere.  Post-IR, personally discussed with Dr. Erlinda Hong. Madaline Brilliant to start heparin with NO BOLUS and low goal rate.  2/1 AM: heparin level sub-therapeutic   Goals of Therapy: Heparin level 0.3-0.5units/mL  Plan: No bolus Inc heparin to 1950 units/hr 1300 heparin level  Narda Bonds, PharmD, [place] Pharmacist Phone: [phone]

## 2017-07-11 NOTE — Progress Notes (Signed)
  Echocardiogram 2D Echocardiogram has been performed.  Justin Adams 07/11/2017, 9:10 AM

## 2017-07-11 NOTE — Progress Notes (Signed)
OT Cancellation Note  Patient Details Name: ARSH FEUTZ MRN: [id number] DOB: 16-Oct-1945   Cancelled Treatment:    Reason Eval/Treat Not Completed: Other (comment) Patient on bedrest. Please update activity orders when appropriate for therapy.Thanks.  [place], OT/L  [phone] 07/11/2017 07/11/2017, 7:25 AM

## 2017-07-11 NOTE — Progress Notes (Signed)
To CT scan with SWOT RN

## 2017-07-11 NOTE — Care Management Note (Signed)
Case Management Note  Patient Details  Name: Justin Adams MRN: [id number] Date of Birth: August 21, 1945  Subjective/Objective:   Pt admitted with intracerebral hemorrhage  Action/Plan:  PTA independent from home with wife.  PT eval ordered.    Expected Discharge Date:                  Expected Discharge Plan:     In-House Referral:     Discharge planning Services  CM Consult  Post Acute Care Choice:    Choice offered to:     DME Arranged:    DME Agency:     HH Arranged:    HH Agency:     Status of Service:     If discussed at [employer], dates discussed:    Additional Comments:  Maryclare Labrador, RN 07/11/2017, 3:43 PM

## 2017-07-12 ENCOUNTER — Inpatient Hospital Stay (HOSPITAL_COMMUNITY): Payer: Medicare Other

## 2017-07-12 DIAGNOSIS — N200 Calculus of kidney: Secondary | ICD-10-CM

## 2017-07-12 DIAGNOSIS — N1330 Unspecified hydronephrosis: Secondary | ICD-10-CM

## 2017-07-12 LAB — BASIC METABOLIC PANEL
ANION GAP: 12 (ref 5–15)
BUN: 20 mg/dL (ref 6–20)
CO2: 21 mmol/L — ABNORMAL LOW (ref 22–32)
Calcium: 8.2 mg/dL — ABNORMAL LOW (ref 8.9–10.3)
Chloride: 108 mmol/L (ref 101–111)
Creatinine, Ser: 0.81 mg/dL (ref 0.61–1.24)
GFR calc Af Amer: >60 mL/min (ref >60)
GFR calc non Af Amer: >60 mL/min (ref >60)
GLUCOSE: 91 mg/dL (ref 65–99)
POTASSIUM: 3.4 mmol/L — AB (ref 3.5–5.1)
Sodium: 141 mmol/L (ref 135–145)

## 2017-07-12 LAB — LUPUS ANTICOAGULANT PANEL
DRVVT: 49.7 s — AB (ref 0.0–47.0)
PTT Lupus Anticoagulant: 31.5 s (ref 0.0–51.9)

## 2017-07-12 LAB — DRVVT MIX: DRVVT MIX: 41.6 s (ref 0.0–47.0)

## 2017-07-12 LAB — CARDIOLIPIN ANTIBODIES, IGG, IGM, IGA
Anticardiolipin IgA: <9 APL U/mL (ref 0–11)
Anticardiolipin IgG: 10 GPL U/mL (ref 0–14)
Anticardiolipin IgM: <9 MPL U/mL (ref 0–12)

## 2017-07-12 LAB — HEPARIN LEVEL (UNFRACTIONATED)
Heparin Unfractionated: 0.92 IU/mL — ABNORMAL HIGH (ref 0.30–0.70)
Heparin Unfractionated: 0.92 IU/mL — ABNORMAL HIGH (ref 0.30–0.70)

## 2017-07-12 LAB — BETA-2-GLYCOPROTEIN I ABS, IGG/M/A: Beta-2-Glycoprotein I IgA: <9 GPI IgA units (ref 0–25)

## 2017-07-12 LAB — CBC
HEMATOCRIT: 48.2 % (ref 39.0–52.0)
Hemoglobin: 16.3 g/dL (ref 13.0–17.0)
MCH: 28.8 pg (ref 26.0–34.0)
MCHC: 33.8 g/dL (ref 30.0–36.0)
MCV: 85.3 fL (ref 78.0–100.0)
Platelets: 211 10*3/uL (ref 150–400)
RBC: 5.65 MIL/uL (ref 4.22–5.81)
RDW: 13.5 % (ref 11.5–15.5)
WBC: 9.2 10*3/uL (ref 4.0–10.5)

## 2017-07-12 LAB — PROTEIN C, TOTAL: Protein C, Total: 92 % (ref 60–150)

## 2017-07-12 MED ORDER — POTASSIUM CHLORIDE CRYS ER 20 MEQ PO TBCR
40.0000 meq | EXTENDED_RELEASE_TABLET | ORAL | Status: AC
  Administered 2017-07-12 (×2): 40 meq via ORAL
  Filled 2017-07-12 (×2): qty 2

## 2017-07-12 MED ORDER — ENOXAPARIN SODIUM 120 MG/0.8ML ~~LOC~~ SOLN
1.0000 mg/kg | Freq: Two times a day (BID) | SUBCUTANEOUS | Status: DC
  Administered 2017-07-12 – 2017-07-14 (×4): 120 mg via SUBCUTANEOUS
  Filled 2017-07-12 (×6): qty 0.8

## 2017-07-12 MED ORDER — HEPARIN (PORCINE) IN NACL 100-0.45 UNIT/ML-% IJ SOLN: 1500.0000 [IU]/h | INTRAMUSCULAR | Status: DC

## 2017-07-12 NOTE — Progress Notes (Signed)
[place] for Heparin Indication: Sinus Venous Thrombosis  Allergies  Allergen Reactions  . Iodinated Diagnostic Agents Rash    Patient Measurements: Height: 6\' 3"  (190.5 cm) Weight: 263 lb (119.3 kg) IBW/kg (Calculated) : 84.5 Heparin Dosing Weight: 109 kg  Vital Signs: Temp: 98.6 F (37 C) (02/01 2324) Temp Source: Oral (02/01 2324) BP: 131/68 (02/02 0100) Pulse Rate: 73 (02/02 0100)  Labs: Recent Labs    07/09/17 2049  07/11/17 0303 07/11/17 1426 07/11/17 2317  HGB 17.5*  --  16.2  --   --   HCT 51.6  --  47.2  --   --   PLT 209  --  227  --   --   APTT 27  --   --   --   --   LABPROT 13.9  --   --   --   --   INR 1.07  --   --   --   --   HEPARINUNFRC  --    < > 0.21* 0.91* 0.92*  CREATININE 0.84  --  0.96  --   --    < > = values in this interval not displayed.    Estimated Creatinine Clearance: 95.4 mL/min (by C-G formula based on SCr of 0.96 mg/dL).   Assessment: 72 y.o. Male presented with subacute right temporal lobe hemorrhage complicated by sinus thrombosis.  He is not on anticoagulation prior to admission.  Patient went to IR with Dr. Estanislado Pandy for 4 vessel and RT ECA arteriogram which showed occluded RT transverse and RT sigmoid sinus with poorly opacified RT internal juglular vein along with prolonged clearance of contrast from RT cerebral hemisphere.  Post-IR, previous pharmacist discussed with Dr. Erlinda Hong. Madaline Brilliant to start heparin with NO BOLUS and low goal rate.  2/2 AM: heparin level supra-therapeutic, no issues per RN.   Goals of Therapy: Heparin level 0.3-0.5units/mL  Plan: Dec heparin to 1700 units/hr 1000 heparin level   Narda Bonds, PharmD, [place] Pharmacist Phone: [phone]

## 2017-07-12 NOTE — Progress Notes (Signed)
ANTICOAGULATION CONSULT NOTE - Initial Consult  Pharmacy Consult for lovenox Indication: Cerebral venous thrombosis  Allergies  Allergen Reactions  . Iodinated Diagnostic Agents Rash    Patient Measurements: Height: 6\' 3"  (190.5 cm) Weight: 263 lb (119.3 kg) IBW/kg (Calculated) : 84.5   Vital Signs: Temp: 98.2 F (36.8 C) (02/02 1114) Temp Source: Oral (02/02 1114) BP: 146/86 (02/02 1300) Pulse Rate: 93 (02/02 1300)  Labs: Recent Labs    07/09/17 2049  07/11/17 0303 07/11/17 1426 07/11/17 2317 07/12/17 0232 07/12/17 1004  HGB 17.5*  --  16.2  --   --  16.3  --   HCT 51.6  --  47.2  --   --  48.2  --   PLT 209  --  227  --   --  211  --   APTT 27  --   --   --   --   --   --   LABPROT 13.9  --   --   --   --   --   --   INR 1.07  --   --   --   --   --   --   HEPARINUNFRC  --    < > 0.21* 0.91* 0.92*  --  0.92*  CREATININE 0.84  --  0.96  --   --  0.81  --    < > = values in this interval not displayed.    Estimated Creatinine Clearance: 113 mL/min (by C-G formula based on SCr of 0.81 mg/dL).   Medical History: Past Medical History:  Diagnosis Date  . Adenomatous polyp   . Cancer [facility]) 2003   prostate  . Hemorrhoids   . Hypercholesteremia   . Hypertension   . Muscular degeneration   . Nephrolithiasis   . Obesity   . Polycythemia   . Shingles   . Sleep apnea      Assessment: 72 y.o. Male presented with subacute right temporal lobe hemorrhage complicated by sinus thrombosis.  He is not on anticoagulation prior to admission.  Patient went to IR with Dr. Estanislado Pandy for 4 vessel and RT ECA arteriogram which showed occluded RT transverse and RT sigmoid sinus with poorly opacified RT internal juglular vein along with prolonged clearance of contrast from RT cerebral hemisphere.  Patient was previously on therapeutic heparin and is now being switched to therapeutic Lovenox. Previous heparin levels have been high, so started holding heparin drip today at 1300.  CBC is stable and no signs of bleeding noted.    Goal of Therapy:  Monitor platelets by anticoagulation protocol: Yes   Plan:  Lovenox 1 mg/kg every 12 hours starting at 1400 (~ 1 hour after heparin drip stopped) Monitor CBC and s/sx of bleeding  Jimmy Footman, PharmD, BCPS PGY2 Infectious Diseases Pharmacy Resident Pager: [phone]  07/12/2017,1:37 PM

## 2017-07-12 NOTE — Progress Notes (Signed)
PT Cancellation Note  Patient Details Name: Justin Adams MRN: [id number] DOB: 04-05-1946   Cancelled Treatment:    Reason Eval/Treat Not Completed: Medical issues which prohibited therapy(Awaiting stat head CT prior to mobilization per MD/nursing.)   Denice Paradise 07/12/2017, 11:53 AM  [facility] [phone] (pager)

## 2017-07-12 NOTE — Evaluation (Signed)
Speech Language Pathology Evaluation Patient Details Name: Justin Adams MRN: [id number] DOB: Nov 20, 1945 Today's Date: 07/12/2017 Time: 6283-1517 SLP Time Calculation (min) (ACUTE ONLY): 11 min  Problem List:  Patient Active Problem List   Diagnosis Date Noted  . Intracerebral hemorrhage 07/10/2017  . ICH (intracerebral hemorrhage) ([place]) 07/10/2017  . Unstable angina ([place]) 03/12/2013   Past Medical History:  Past Medical History:  Diagnosis Date  . Adenomatous polyp   . Cancer [facility]) 2003   prostate  . Hemorrhoids   . Hypercholesteremia   . Hypertension   . Muscular degeneration   . Nephrolithiasis   . Obesity   . Polycythemia   . Shingles   . Sleep apnea    Past Surgical History:  Past Surgical History:  Procedure Laterality Date  . CHOLECYSTECTOMY    . IR ANGIO EXTERNAL CAROTID SEL EXT CAROTID UNI R MOD SED  07/10/2017  . IR ANGIO INTRA EXTRACRAN SEL INTERNAL CAROTID BILAT MOD SED  07/10/2017  . IR ANGIO VERTEBRAL SEL VERTEBRAL BILAT MOD SED  07/10/2017  . LEFT HEART CATHETERIZATION WITH CORONARY ANGIOGRAM N/A 03/15/2013   Procedure: LEFT HEART CATHETERIZATION WITH CORONARY ANGIOGRAM;  Surgeon: Minus Breeding, MD;  Location: [facility] CATH LAB;  Service: Cardiovascular;  Laterality: N/A;  . PROSTATECTOMY    . STONE EXTRACTION WITH BASKET     HPI:  Mr.Justin A Fusaiottiis a 72 y.o.malewith history of prostate cancer, HTN, HLD, polycythemia, OSA admitted for right frontal headache and nausea. No tPA given due to [place]. 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   Assessment / Plan / Recommendation Clinical Impression   Patient presents with suspected higher level cognitive impairments in abstract reasoning, problem solving and executive function. Assessment today was limited as transport arrived to take patient to CT. Portions of the Cognistat were administered; 7 digit repetition is intact, delayed recall 3/4 words. Pt required cues for abstract  reasoning. Given prior high level of function (pt driving, managing finances and medications), SLP will follow acutely for further assessment of higher level cognitive deficits; discussed with pt potential areas of impairment and follow-up in outpatient setting for further assessment of cognition. Pt, daughter in law in agreement.    SLP Assessment  SLP Recommendation/Assessment: Patient needs continued Speech Lanaguage Pathology Services SLP Visit Diagnosis: Cognitive communication deficit (R41.841)    Follow Up Recommendations  Outpatient SLP    Frequency and Duration min 1 x/week  1 week      SLP Evaluation Cognition  Overall Cognitive Status: Impaired/Different from baseline Arousal/Alertness: Awake/alert Orientation Level: Oriented X4 Attention: Focused;Sustained;Selective Focused Attention: Appears intact Sustained Attention: Appears intact Selective Attention: Appears intact Memory: Impaired Memory Impairment: Decreased recall of new information(delayed recall 3/4) Awareness: Appears intact Problem Solving: Appears intact(intact for simple verbal math) Executive Function: Reasoning Reasoning: Impaired Reasoning Impairment: Verbal complex Safety/Judgment: Appears intact       Comprehension  Auditory Comprehension Overall Auditory Comprehension: Appears within functional limits for tasks assessed Visual Recognition/Discrimination Discrimination: Within Function Limits Reading Comprehension Reading Status: Not tested    Expression Expression Primary Mode of Expression: Verbal Verbal Expression Overall Verbal Expression: Appears within functional limits for tasks assessed Repetition: No impairment Naming: Not tested Pragmatics: No impairment Written Expression Dominant Hand: Right Written Expression: Not tested   Oral / Motor  Oral Motor/Sensory Function Overall Oral Motor/Sensory Function: Within functional limits Motor Speech Overall Motor Speech: Appears  within functional limits for tasks assessed Respiration: Within functional limits Phonation: Normal Resonance: Within functional limits  Articulation: Within functional limitis Intelligibility: Intelligible Motor Planning: Witnin functional limits Motor Speech Errors: Not applicable   GO                    Aliene Altes 07/12/2017, 11:53 AM  Deneise Lever, [place], [place] Speech-Language Pathologist [phone]

## 2017-07-12 NOTE — Progress Notes (Signed)
[place] for Heparin Indication: Sinus Venous Thrombosis  Allergies  Allergen Reactions  . Iodinated Diagnostic Agents Rash    Patient Measurements: Height: 6\' 3"  (190.5 cm) Weight: 263 lb (119.3 kg) IBW/kg (Calculated) : 84.5 Heparin Dosing Weight: 109 kg  Vital Signs: Temp: 98.2 F (36.8 C) (02/02 1114) Temp Source: Oral (02/02 1114) BP: 148/79 (02/02 1100) Pulse Rate: 70 (02/02 1100)  Labs: Recent Labs    07/09/17 2049  07/11/17 0303 07/11/17 1426 07/11/17 2317 07/12/17 0232 07/12/17 1004  HGB 17.5*  --  16.2  --   --  16.3  --   HCT 51.6  --  47.2  --   --  48.2  --   PLT 209  --  227  --   --  211  --   APTT 27  --   --   --   --   --   --   LABPROT 13.9  --   --   --   --   --   --   INR 1.07  --   --   --   --   --   --   HEPARINUNFRC  --    < > 0.21* 0.91* 0.92*  --  0.92*  CREATININE 0.84  --  0.96  --   --  0.81  --    < > = values in this interval not displayed.    Estimated Creatinine Clearance: 113 mL/min (by C-G formula based on SCr of 0.81 mg/dL).   Assessment: 72 y.o. Male presented with subacute right temporal lobe hemorrhage complicated by sinus thrombosis.  He is not on anticoagulation prior to admission.  Patient went to IR with Dr. Estanislado Pandy for 4 vessel and RT ECA arteriogram which showed occluded RT transverse and RT sigmoid sinus with poorly opacified RT internal juglular vein along with prolonged clearance of contrast from RT cerebral hemisphere.  Post-IR, previous pharmacist discussed with Dr. Erlinda Hong. Madaline Brilliant to start heparin with NO BOLUS and low goal rate.  Heparin level remains high this morning at 0.92 despite decreases. CBC remains within normal limits and no signs of bleeding have been noted. Spoke with nurse and checked with patient that labs have been drawn out of the opposite arm of the IV infusion.   Goals of Therapy: Heparin level 0.3-0.5units/mL  Plan: Hold heparin for 1 hour  Decrease  heparin to 1500 units/hr Re-check heparin level at 2100 Daily heparin level/CBC   Jimmy Footman, PharmD, BCPS PGY2 Infectious Diseases Pharmacy Resident Pager: [phone]  07/12/2017 12:48 PM

## 2017-07-12 NOTE — Evaluation (Signed)
Physical Therapy Evaluation Patient Details Name: Justin Adams MRN: [id number] DOB: Jan 09, 1946 Today's Date: 07/12/2017   History of Present Illness  Justin Adams is a 72 y.o. male with history of prostate cancer, HTN, HLD, polycythemia, OSA admitted for right frontal headache and nausea. No tPA given due to [place]. 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   Clinical Impression  Pt admitted with above diagnosis. Pt currently with functional limitations due to the deficits listed below (see PT Problem List). Pt was able to ambulate with RW on unit with fair balance overall.  Pt should progress and go home with wife with 24 hour initial assist.  Pt will benefit from skilled PT to increase their independence and safety with mobility to allow discharge to the venue listed below.      Follow Up Recommendations Outpatient PT;Supervision/Assistance - 24 hour    Equipment Recommendations  Other (comment)(may need a tall RW)    Recommendations for Other Services       Precautions / Restrictions Precautions Precautions: Fall Restrictions Weight Bearing Restrictions: No      Mobility  Bed Mobility Overal bed mobility: Independent                Transfers Overall transfer level: Needs assistance Equipment used: Rolling walker (2 wheeled) Transfers: Sit to/from Stand Sit to Stand: Min guard            Ambulation/Gait Ambulation/Gait assistance: Min guard;+2 safety/equipment Ambulation Distance (Feet): 280 Feet Assistive device: Rolling walker (2 wheeled) Gait Pattern/deviations: Step-through pattern;Decreased stride length   Gait velocity interpretation: Below normal speed for age/gender General Gait Details: No LOB with pt ambulating on unit.  Pt uses RW well and may need for home use.  Pt witht steady gait with UE support.  Stairs            Wheelchair Mobility    Modified Rankin (Stroke Patients Only) Modified  Rankin (Stroke Patients Only) Pre-Morbid Rankin Score: No symptoms Modified Rankin: Moderately severe disability     Balance Overall balance assessment: Needs assistance Sitting-balance support: No upper extremity supported;Feet supported Sitting balance-Leahy Scale: Good     Standing balance support: Bilateral upper extremity supported;During functional activity Standing balance-Leahy Scale: Poor Standing balance comment: relies on RW for balance                             Pertinent Vitals/Pain Pain Assessment: No/denies pain   VSS Home Living Family/patient expects to be discharged to:: Private residence Living Arrangements: Spouse/significant other Available Help at Discharge: Family;Available 24 hours/day Type of Home: House Home Access: Stairs to enter Entrance Stairs-Rails: None Entrance Stairs-Number of Steps: 2 Home Layout: One level Home Equipment: Shower seat - built in;Grab bars - tub/shower      Prior Function Level of Independence: Independent               Hand Dominance   Dominant Hand: Right    Extremity/Trunk Assessment   Upper Extremity Assessment Upper Extremity Assessment: Defer to OT evaluation    Lower Extremity Assessment Lower Extremity Assessment: Generalized weakness    Cervical / Trunk Assessment Cervical / Trunk Assessment: Normal  Communication   Communication: No difficulties  Cognition Arousal/Alertness: Awake/alert Behavior During Therapy: WFL for tasks assessed/performed Overall Cognitive Status: Impaired/Different from baseline Area of Impairment: Safety/judgement;Memory;Problem solving  Memory: Decreased short-term memory       Problem Solving: Requires verbal cues        General Comments      Exercises     Assessment/Plan    PT Assessment Patient needs continued PT services  PT Problem List Decreased activity tolerance;Decreased balance;Decreased  mobility;Decreased knowledge of use of DME;Decreased safety awareness;Decreased cognition;Decreased knowledge of precautions       PT Treatment Interventions DME instruction;Stair training;Gait training;Functional mobility training;Therapeutic activities;Therapeutic exercise;Balance training;Cognitive remediation;Patient/family education    PT Goals (Current goals can be found in the Care Plan section)  Acute Rehab PT Goals Patient Stated Goal: to go home PT Goal Formulation: With patient Time For Goal Achievement: 07/26/17 Potential to Achieve Goals: Good    Frequency Min 4X/week   Barriers to discharge        Co-evaluation               AM-PAC PT "6 Clicks" Daily Activity  Outcome Measure Difficulty turning over in bed (including adjusting bedclothes, sheets and blankets)?: None Difficulty moving from lying on back to sitting on the side of the bed? : None Difficulty sitting down on and standing up from a chair with arms (e.g., wheelchair, bedside commode, etc,.)?: A Little Help needed moving to and from a bed to chair (including a wheelchair)?: A Little Help needed walking in hospital room?: A Little Help needed climbing 3-5 steps with a railing? : A Lot 6 Click Score: 19    End of Session Equipment Utilized During Treatment: Gait belt Activity Tolerance: Patient limited by fatigue Patient left: in chair;with call bell/phone within reach;with chair alarm set;with family/visitor present Nurse Communication: Mobility status PT Visit Diagnosis: Unsteadiness on feet (R26.81);Muscle weakness (generalized) (M62.81)    Time: 1423-9532 PT Time Calculation (min) (ACUTE ONLY): 28 min   Charges:   PT Evaluation $PT Eval Moderate Complexity: 1 Mod PT Treatments $Gait Training: 8-22 mins   PT G Codes:        Abel Ra,PT Acute Rehabilitation [phone] (pager)   Denice Paradise 07/12/2017, 3:44 PM

## 2017-07-12 NOTE — Progress Notes (Signed)
STROKE TEAM PROGRESS NOTE   SUBJECTIVE (INTERVAL HISTORY) His daughter is at the bedside.  Pt awake alert orientated. On heparin low intensity now and no neuro changes. Pan CT ruled out malignancy but found to have kidney stone and hydronephrosis on the left. Complain of mild head discomfort. Will do CT head.   OBJECTIVE Temp:  [97.7 F (36.5 C)-99.7 F (37.6 C)] 97.7 F (36.5 C) (02/02 0700) Pulse Rate:  [54-100] 62 (02/02 0600) Cardiac Rhythm: Normal sinus rhythm (02/02 0400) Resp:  [14-30] 16 (02/02 0600) BP: (119-148)/(60-115) 135/115 (02/02 0600) SpO2:  [88 %-96 %] 96 % (02/02 0600)  Recent Labs  Lab 07/10/17 1415  GLUCAP 111*   Recent Labs  Lab 07/09/17 2049 07/11/17 0303 07/12/17 0232  NA 138 139 141  K 4.1 3.7 3.4*  CL 103 109 108  CO2 22 21* 21*  GLUCOSE 134* 110* 91  BUN 15 25* 20  CREATININE 0.84 0.96 0.81  CALCIUM 8.9 8.4* 8.2*   No results for input(s): AST, ALT, ALKPHOS, BILITOT, PROT, ALBUMIN in the last 168 hours. Recent Labs  Lab 07/09/17 2049 07/11/17 0303 07/12/17 0232  WBC 9.8 11.5* 9.2  NEUTROABS 8.4*  --   --   HGB 17.5* 16.2 16.3  HCT 51.6 47.2 48.2  MCV 86.4 84.6 85.3  PLT 209 227 211   No results for input(s): CKTOTAL, CKMB, CKMBINDEX, TROPONINI in the last 168 hours. Recent Labs    07/09/17 2049  LABPROT 13.9  INR 1.07   No results for input(s): COLORURINE, LABSPEC, PHURINE, GLUCOSEU, HGBUR, BILIRUBINUR, KETONESUR, PROTEINUR, UROBILINOGEN, NITRITE, LEUKOCYTESUR in the last 72 hours.  Invalid input(s): APPERANCEUR     Component Value Date/Time   CHOL 159 07/11/2017 0303   TRIG 78 07/11/2017 0303   HDL 36 (L) 07/11/2017 0303   CHOLHDL 4.4 07/11/2017 0303   VLDL 16 07/11/2017 0303   LDLCALC 107 (H) 07/11/2017 0303   Lab Results  Component Value Date   HGBA1C 5.3 07/11/2017   No results found for: LABOPIA, COCAINSCRNUR, LABBENZ, AMPHETMU, THCU, LABBARB  No results for input(s): ETH in the last 168 hours.  I have  personally reviewed the radiological images below and agree with the radiology interpretations.  Ct Head Wo Contrast  Result Date: 07/10/2017 CLINICAL DATA:  72 year old male with right temporal lobe intra-axial hemorrhage diagnosed yesterday after presentation with persistent headache. EXAM: CT HEAD WITHOUT CONTRAST TECHNIQUE: Contiguous axial images were obtained from the base of the skull through the vertex without intravenous contrast. COMPARISON:  Head CT without contrast 0152 hr today. Brain MRI 07/09/2017. FINDINGS: Brain: Mostly hyperdense intra-axial hemorrhage within the right temporal lobe centrally and involving the inferior middle and superior temporal gyri. Hemorrhage encompasses 62 by 31 by 38 millimeters (AP by transverse by CC) for an estimated blood volume of 37 milliliters -unchanged from the 07/09/2017 MRI. Surrounding edema and regional mass effect remain stable. No intraventricular extension. The small 2-3 millimeter superimposed right subdural hematoma more apparent by MRI is stable. Stable trace leftward midline shift. Basilar cisterns remain stable. Stable ventricle size and configuration. Stable gray-white matter differentiation throughout the brain. No new cortically based infarct identified. Vascular: Asymmetric hyperdensity of the right transverse and sigmoid sinuses. Corresponding to loss of enhancement and filling defect on MRI yesterday. Calcified atherosclerosis at the skull base. Skull: Stable and negative. Sinuses/Orbits: Stable. Widespread ethmoid mucosal thickening. Bilateral maxillary sinus bubbly opacity. Tympanic cavities and mastoids remain clear. Other: Stable orbit and scalp soft tissues. IMPRESSION: 1. Right transverse  and sigmoid sinus thrombosis with acute right temporal lobe intra-axial hemorrhage and small/trace right subdural hematoma. 2. Stable parenchymal hemorrhage volume since the MRI 07/09/2017, estimated at 37 mL. Stable surrounding edema and mild regional  mass effect. 3. Stable trace right lateral subdural hematoma. No intraventricular hemorrhage extension or ventriculomegaly. No increasing intracranial mass effect. Stable trace leftward midline shift. 4. No new No acute intracranial abnormality. Electronically Signed   By: Genevie Ann M.D.   On: 07/10/2017 08:39   Ct Head Wo Contrast  Result Date: 07/10/2017 CLINICAL DATA:  72 y/o M; 2 weeks of headache and 2 days of nausea, suspect intracranial hemorrhage. EXAM: CT HEAD WITHOUT CONTRAST TECHNIQUE: Contiguous axial images were obtained from the base of the skull through the vertex without intravenous contrast. COMPARISON:  07/09/2017 MRI head FINDINGS: Brain: Right temporal lobe hematoma spanning 6.0 x 2.7 x 2.7 cm (volume = 23 cm^3)(AP x ML x CC series 5, image 41 and series 6, image 17). Surrounding edema and result in mass effect results in minimal right-sided uncal herniation, partial effacement of the right lateral ventricle, and 3 mm of right-to-left midline shift. There is a small volume of subarachnoid hemorrhage overlying the right temporal lobe and trace volume of hemorrhage within occipital horns of lateral ventricles likely due to redistribution. Findings are stable from prior MRI of the brain given differences in technique. No additional acute intracranial hemorrhage, large territory infarct, or focal mass effect is identified. Background of mild chronic microvascular ischemic changes and parenchymal volume loss of the brain. Vascular: No hyperdense vessel or unexpected calcification. Skull: Normal. Negative for fracture or focal lesion. Sinuses/Orbits: Moderate diffuse paranasal sinus mucosal thickening with aerosolized secretions in the maxillary sinuses. Normal aeration of mastoid air cells. Bilateral intra-ocular lens replacement. Other: None. IMPRESSION: 1. Stable mixed attenuation hematoma in the right temporal lobe measuring up to 6 cm, 23 cc. Stable mass effect with 3 mm right-to-left midline  shift. Stable small volume subarachnoid hemorrhage over right convexity and trace intraventricular hemorrhage likely due to redistribution. 2. No new acute intracranial abnormality identified. These results were called by telephone at the time of interpretation on 07/10/2017 at 2:14 am to Dr. Kerney Elbe , who verbally acknowledged these results. Electronically Signed   By: Kristine Garbe M.D.   On: 07/10/2017 02:13   Ct Chest W Contrast  Result Date: 07/11/2017 CLINICAL DATA:  Prostate cancer. Thrombosis of the right transverse sinus. Evaluate for occult malignancy. EXAM: CT CHEST, ABDOMEN, AND PELVIS WITH CONTRAST TECHNIQUE: Multidetector CT imaging of the chest, abdomen and pelvis was performed following the standard protocol during bolus administration of intravenous contrast. CONTRAST:  168mL ISOVUE-300 IOPAMIDOL (ISOVUE-300) INJECTION 61% COMPARISON:  Abdomen and pelvis CT 06/14/2009 FINDINGS: CT CHEST FINDINGS Cardiovascular: The heart size is normal. No pericardial effusion. Coronary artery calcification is evident. Mediastinum/Nodes: No mediastinal lymphadenopathy. There is no hilar lymphadenopathy. The esophagus has normal imaging features. There is no axillary lymphadenopathy. Lungs/Pleura: Areas of atelectasis noted in the lower lobes bilaterally and in the lingula. No pulmonary edema or pleural effusion. No suspicious pulmonary nodule or mass given limitations of the atelectasis in the posterior lower lobes. Musculoskeletal: Bone windows reveal no worrisome lytic or sclerotic osseous lesions. CT ABDOMEN PELVIS FINDINGS Hepatobiliary: No focal abnormality within the liver parenchyma. Gallbladder surgically absent. No intrahepatic or extrahepatic biliary dilation. Pancreas: No focal mass lesion. No dilatation of the main duct. No intraparenchymal cyst. No peripancreatic edema. Spleen: No splenomegaly. No focal mass lesion. Adrenals/Urinary Tract: No adrenal  nodule or mass. Right kidney  unremarkable. No right hydroureteronephrosis. There atrophy of the left kidney with left hydronephrosis, new in the long interval since prior study. 13 mm nonobstructing stone identified in the left renal pelvis with 9 mm nonobstructing stone identified in the lower pole. Left ureter is dilated down to its mid segment where there is abrupt transition to nondilated ureter. No obstructing lesion is evident at the transition point. The urinary bladder appears normal for the degree of distention. Stomach/Bowel: Stomach is nondistended. No gastric wall thickening. No evidence of outlet obstruction. Duodenum is normally positioned as is the ligament of Treitz. Duodenal diverticulum noted. No small bowel wall thickening. No small bowel dilatation. The terminal ileum is normal. The appendix is not visualized, but there is no edema or inflammation in the region of the cecum. No gross colonic mass. No colonic wall thickening. No substantial diverticular change. Vascular/Lymphatic: There is abdominal aortic atherosclerosis without aneurysm. There is no gastrohepatic or hepatoduodenal ligament lymphadenopathy. No intraperitoneal or retroperitoneal lymphadenopathy. No pelvic sidewall lymphadenopathy. Reproductive: Prostate gland surgically absent. Other: No intraperitoneal free fluid. Musculoskeletal: There minimal edema in the right groin compatible with recent vascular access. IMPRESSION: 1. No evidence for neoplasm in the chest, abdomen, or pelvis. 2. Left hydroureteronephrosis with left ureter dilated to the mid segment where there is abrupt transition to nondilated ureter. No obstructing stone or soft tissue lesion evident by CT. Overlying left renal atrophy suggests chronic process. 2 stones are seen in the left kidney. 3. Status post prostatectomy. No lymphadenopathy in the abdomen or pelvis. 4. Subsegmental atelectasis in both lower lobes and lingula. Electronically Signed   By: Misty Stanley M.D.   On: 07/11/2017 14:21    Mr Jodene Nam Head Wo Contrast  Result Date: 07/10/2017 CLINICAL DATA:  Right temporal lobe intraparenchymal hemorrhage with suspicion of venous thrombosis. EXAM: MRA head without CONTRAST MRV HEAD WITHOUT CONTRAST TECHNIQUE: Multiplanar, multiecho pulse sequences of the brain and surrounding structures were obtained with intravenous contrast. Angiographic images of the intracranial venous structures were obtained using MRV technique without intravenous contrast. COMPARISON:  MRI 07/09/2017.  CT studies earlier same day. FINDINGS: MRA HEAD FINDINGS Some motion degradation. Both internal carotid arteries are widely patent through the skull base and siphon regions. Left anterior and middle cerebral vessels are normal without proximal stenosis, aneurysm or vascular malformation. On the right, the anterior cerebral vessels appear normal. The right M1 segment is patent. There is distortion of the right middle cerebral artery branches secondary to the intraparenchymal hematoma. I do not suspect a primary arterial abnormality. No sign of aneurysm or high flow vascular malformation. Posterior circulation vessels appear normal. Both vertebral arteries widely patent to the basilar. No basilar stenosis. Posterior circulation branch vessels appear normal, with some motion degradation. MRV HEAD FINDINGS Superior sagittal sinus is patent. Deep veins are patent. Left transverse sinus, sigmoid sinus and jugular vein are patent. There thrombosis of the right transverse sinus, sigmoid sinus and jugular vein. IMPRESSION: MR venography confirmed thrombosis of the right transverse sinus, sigmoid sinus and jugular vein. Superior sagittal sinus, deep venous system and left sided drainage are patent. MR angiography does not show any primary arterial side pathology. There is diminished visualization of the right MCA branches due to the regional hematoma. No sign of aneurysm or high flow vascular abnormality. Electronically Signed   By: Nelson Chimes M.D.   On: 07/10/2017 11:04   Mr Jeri Cos DT Contrast  Result Date: 07/09/2017 CLINICAL DATA:  Initial evaluation  for persistent headaches for past 2 weeks EXAM: MRI HEAD WITHOUT AND WITH CONTRAST TECHNIQUE: Multiplanar, multiecho pulse sequences of the brain and surrounding structures were obtained without and with intravenous contrast. CONTRAST:  20 cc of MultiHance. COMPARISON:  None available. FINDINGS: Brain: Study moderately degraded by motion artifact. Generalized age-related cerebral atrophy. Patchy T2/FLAIR hyperintensity within the periventricular and deep white matter both cerebral hemispheres, most consistent with chronic small vessel ischemic disease, mild in nature. There is an acute to subacute appearing intraparenchymal hematoma centered at the peripheral right temporal lobe measuring approximately 6.1 x 3.1 x 3.8 cm (series 8, image 11). Surrounding rim of vasogenic edema with mild regional mass effect. Associated small volume subarachnoid hemorrhage within the adjacent right frontotemporal region, evident on SWI sequence (series 10, image 52). Additionally, evidence for extra-axial extension with a in 3 mm right holo hemispheric subdural hematoma. Trace layering blood products within the occipital horns of both lateral ventricles, suspected to be related to redistribution (series 10, image 33). Mild mass effect on the right lateral ventricle which is partially attenuated. Trace 3 mm right-to-left shift. No hydrocephalus or ventricular trapping. Basilar cisterns are patent. Scattered leptomeningeal enhancement within the right frontotemporal region likely reactive. No discernible mass lesion seen underlying the intraparenchymal hematoma. Abnormal flow void within the right transverse and sigmoid sinus, suspicious for possible venous sinus thrombosis (series 7, image 8). No evidence for acute infarct. Gray-white matter differentiation otherwise maintained. No other encephalomalacia to  suggest chronic infarction. No other mass lesion. Pituitary gland within normal limits. Midline structures intact and normal. Vascular: Major arterial vascular flow voids maintained at the skull base. Skull and upper cervical spine: Craniocervical junction within normal limits. Upper cervical spine normal. Bone marrow signal intensity within normal limits. No scalp soft tissue abnormality. Sinuses/Orbits: Globes and orbital soft tissues within normal limits. Patient status post lens extraction bilaterally. Scattered mucosal thickening within the maxillary sinuses and ethmoidal air cells. Trace bilateral mastoid effusions noted. Other: None. IMPRESSION: 1. 6.1 x 3.1 x 3.8 cm acute to subacute intraparenchymal hematoma positioned within the anterior right temporal lobe. Associated edema with regional mass effect with trace 3 mm right-to-left midline shift. 2. Associated small volume subarachnoid hemorrhage within the adjacent right frontotemporal region. Trace intraventricular blood products suspected to be related to redistribution. 3. Evidence for intra-axial extension with small 3 mm right holo hemispheric subdural hematoma. 4. Abnormal flow void within the right transverse and sigmoid sinuses, suspicious for venous sinus thrombosis. Findings suspected to be the underlying etiology for the intraparenchymal hemorrhage. 5. Mild chronic small vessel ischemic disease. Critical Value/emergent results were called by telephone at the time of interpretation on 07/09/2017 at 9:19 pm to Dr. Tomi Bamberger , who verbally acknowledged these results. Electronically Signed   By: Jeannine Boga M.D.   On: 07/09/2017 21:20   Ct Abdomen Pelvis W Contrast  Result Date: 07/11/2017 CLINICAL DATA:  Prostate cancer. Thrombosis of the right transverse sinus. Evaluate for occult malignancy. EXAM: CT CHEST, ABDOMEN, AND PELVIS WITH CONTRAST TECHNIQUE: Multidetector CT imaging of the chest, abdomen and pelvis was performed following the  standard protocol during bolus administration of intravenous contrast. CONTRAST:  13mL ISOVUE-300 IOPAMIDOL (ISOVUE-300) INJECTION 61% COMPARISON:  Abdomen and pelvis CT 06/14/2009 FINDINGS: CT CHEST FINDINGS Cardiovascular: The heart size is normal. No pericardial effusion. Coronary artery calcification is evident. Mediastinum/Nodes: No mediastinal lymphadenopathy. There is no hilar lymphadenopathy. The esophagus has normal imaging features. There is no axillary lymphadenopathy. Lungs/Pleura: Areas of atelectasis noted in the lower  lobes bilaterally and in the lingula. No pulmonary edema or pleural effusion. No suspicious pulmonary nodule or mass given limitations of the atelectasis in the posterior lower lobes. Musculoskeletal: Bone windows reveal no worrisome lytic or sclerotic osseous lesions. CT ABDOMEN PELVIS FINDINGS Hepatobiliary: No focal abnormality within the liver parenchyma. Gallbladder surgically absent. No intrahepatic or extrahepatic biliary dilation. Pancreas: No focal mass lesion. No dilatation of the main duct. No intraparenchymal cyst. No peripancreatic edema. Spleen: No splenomegaly. No focal mass lesion. Adrenals/Urinary Tract: No adrenal nodule or mass. Right kidney unremarkable. No right hydroureteronephrosis. There atrophy of the left kidney with left hydronephrosis, new in the long interval since prior study. 13 mm nonobstructing stone identified in the left renal pelvis with 9 mm nonobstructing stone identified in the lower pole. Left ureter is dilated down to its mid segment where there is abrupt transition to nondilated ureter. No obstructing lesion is evident at the transition point. The urinary bladder appears normal for the degree of distention. Stomach/Bowel: Stomach is nondistended. No gastric wall thickening. No evidence of outlet obstruction. Duodenum is normally positioned as is the ligament of Treitz. Duodenal diverticulum noted. No small bowel wall thickening. No small bowel  dilatation. The terminal ileum is normal. The appendix is not visualized, but there is no edema or inflammation in the region of the cecum. No gross colonic mass. No colonic wall thickening. No substantial diverticular change. Vascular/Lymphatic: There is abdominal aortic atherosclerosis without aneurysm. There is no gastrohepatic or hepatoduodenal ligament lymphadenopathy. No intraperitoneal or retroperitoneal lymphadenopathy. No pelvic sidewall lymphadenopathy. Reproductive: Prostate gland surgically absent. Other: No intraperitoneal free fluid. Musculoskeletal: There minimal edema in the right groin compatible with recent vascular access. IMPRESSION: 1. No evidence for neoplasm in the chest, abdomen, or pelvis. 2. Left hydroureteronephrosis with left ureter dilated to the mid segment where there is abrupt transition to nondilated ureter. No obstructing stone or soft tissue lesion evident by CT. Overlying left renal atrophy suggests chronic process. 2 stones are seen in the left kidney. 3. Status post prostatectomy. No lymphadenopathy in the abdomen or pelvis. 4. Subsegmental atelectasis in both lower lobes and lingula. Electronically Signed   By: Misty Stanley M.D.   On: 07/11/2017 14:21   Mr Mrv Head Wo Cm  Result Date: 07/10/2017 CLINICAL DATA:  Right temporal lobe intraparenchymal hemorrhage with suspicion of venous thrombosis. EXAM: MRA head without CONTRAST MRV HEAD WITHOUT CONTRAST TECHNIQUE: Multiplanar, multiecho pulse sequences of the brain and surrounding structures were obtained with intravenous contrast. Angiographic images of the intracranial venous structures were obtained using MRV technique without intravenous contrast. COMPARISON:  MRI 07/09/2017.  CT studies earlier same day. FINDINGS: MRA HEAD FINDINGS Some motion degradation. Both internal carotid arteries are widely patent through the skull base and siphon regions. Left anterior and middle cerebral vessels are normal without proximal  stenosis, aneurysm or vascular malformation. On the right, the anterior cerebral vessels appear normal. The right M1 segment is patent. There is distortion of the right middle cerebral artery branches secondary to the intraparenchymal hematoma. I do not suspect a primary arterial abnormality. No sign of aneurysm or high flow vascular malformation. Posterior circulation vessels appear normal. Both vertebral arteries widely patent to the basilar. No basilar stenosis. Posterior circulation branch vessels appear normal, with some motion degradation. MRV HEAD FINDINGS Superior sagittal sinus is patent. Deep veins are patent. Left transverse sinus, sigmoid sinus and jugular vein are patent. There thrombosis of the right transverse sinus, sigmoid sinus and jugular vein. IMPRESSION: MR venography  confirmed thrombosis of the right transverse sinus, sigmoid sinus and jugular vein. Superior sagittal sinus, deep venous system and left sided drainage are patent. MR angiography does not show any primary arterial side pathology. There is diminished visualization of the right MCA branches due to the regional hematoma. No sign of aneurysm or high flow vascular abnormality. Electronically Signed   By: Nelson Chimes M.D.   On: 07/10/2017 11:04   Ir Angio Intra Extracran Sel Internal Carotid Bilat Mod Sed  Result Date: 07/11/2017 CLINICAL DATA:  Worsening headaches. CT scan revealing right temporal hematoma. Thrombus in the right transverse sinus and right sigmoid sinus EXAM: IR ANGIO INTRA EXTRACRAN SEL INTERNAL CAROTID BILAT MOD SED MEDICATIONS: Heparin 1000 units IV; Marland Kitchen The antibiotic was administered within 1 hour of the procedure ANESTHESIA/SEDATION: Versed  mg IV; Fentanyl  mcg IV Moderate Sedation Time:   minutes The patient was continuously monitored during the procedure by the interventional radiology nurse under my direct supervision. FLUOROSCOPY TIME:  Fluoroscopy Time:  minutes  seconds ( mGy). COMPLICATIONS: None  immediate. TECHNIQUE: Informed written consent was obtained from the patient after a thorough discussion of the procedural risks, benefits and alternatives. All questions were addressed. Maximal Sterile Barrier Technique was utilized including caps, mask, sterile gowns, sterile gloves, sterile drape, hand hygiene and skin antiseptic. A timeout was performed prior to the initiation of the procedure. The right groin was prepped and draped in the usual sterile fashion. Thereafter using modified Seldinger technique, transfemoral access into the right common femoral artery was obtained without difficulty. Over a 0.035 inch guidewire, a 5 French Pinnacle sheath was inserted. Through this, and also over 0.035 inch guidewire, a 5 [place] JB 1 catheter was advanced to the aortic arch region and selectively positioned in the right common carotid artery, the right external carotid artery, the right vertebral artery, the left common carotid artery and the left vertebral artery. FINDINGS: Right CCA: The right common carotid arteriogram demonstrates the right external carotid artery and its major branches to be widely patent. Right ICA: The right internal carotid artery at the bulb to the cranial skull base opacifies normally. Right ECA: The selective right external carotid artery injection demonstrates normal opacification of the right external carotid artery branches. Right Intracranial: The right middle cerebral artery and the right anterior cerebral artery opacify normally into the capillary and venous phases. Transient cross filling via the anterior communicating artery of the left anterior cerebral artery A2 segment is noted. Right Sided Anterior Venous: The petrous, cavernous and supraclinoid segments are widely patent. The venous phase demonstrates preferential egress of contrast into the right transverse sinus, the sigmoid sinus and the right internal jugular vein. There is no early venous shunting into the venous  structures at the skull base or intracranially into the dural sinuses. Right Vertebral Extracranial: The origin of the right vertebral artery is normal. The vessel is seen to opacify normally to the cranial skull base. Normal opacification is seen of the right vertebrobasilar junction and the right posterior-inferior cerebellar artery. Right Vertebral Intracranial: The opacified portion of the basilar artery, the right posterior cerebral artery, the superior cerebellar arteries and the anterior-inferior cerebellar arteries is normal into the capillary and venous phases. Non-opacified blood is seen in the basilar artery from the contralateral vertebral artery. Right Sided Posterior Venous: The right transverse sinus and the sigmoid sinus are normal and widely patent. ________________________________________________________ Left CCA: The left common carotid arteriogram demonstrates the left external carotid artery and its major branches to  be widely patent. Left ICA: The left internal carotid artery at the bulb to the cranial skull base opacifies normally. Left ECA: The selective left external carotid artery injection demonstrates normal opacification of the left external carotid artery branches. Left Intracranial: The left middle cerebral artery and the left anterior cerebral artery opacify normally into the capillary and venous phases. Transient cross filling via the anterior communicating artery of the right anterior cerebral artery A2 segment is noted. Left Sided Anterior Venous: The petrous, cavernous and supraclinoid segments are widely patent. The venous phase demonstrates preferential egress of contrast into the left transverse sinus, the sigmoid sinus and the left internal jugular vein. There is no early venous shunting into the venous structures at the skull base or intracranially into the dural sinuses. Left Vertebral Extracranial: The origin of the left vertebral artery is normal. The vessel is seen to  opacify normally to the cranial skull base. Normal opacification is seen of the left vertebrobasilar junction and the lfet posterior-inferior cerebellar artery. Left Vertebral Intracranial: The opacified portion of the basilar artery, the left posterior cerebral artery, the superior cerebellar arteries and the anterior-inferior cerebellar arteries is normal into the capillary and venous phases. Non-opacified blood is seen in the basilar artery from the contralateral vertebral artery. Left Sided Posterior Venous: The left transverse sinus and the sigmoid sinus are normal and widely patent. Electronically Signed   By: Luanne Bras M.D.   On: 07/11/2017 09:12   Ir Angio Vertebral Sel Vertebral Bilat Mod Sed  Result Date: 07/11/2017 CLINICAL DATA:  Progressive headaches. Right temporal hemorrhage. Thrombosis within the right transverse sinus and right sigmoid sinus. EXAM: IR ANGIO EXTERNAL CAROTID SEL EXT CAROTID UNI RIGHT MOD SED COMPARISON:  CT of the head, and MRI of the brain of 07/10/2017. MEDICATIONS: No heparin was given. No antibiotic was administered within 1 hour of the procedure. ANESTHESIA/SEDATION: Versed 1 mg IV; Fentanyl 50 mcg IV. Moderate Sedation Time:  60 minutes. The patient was continuously monitored during the procedure by the interventional radiology nurse under my direct supervision. CONTRAST:  Isovue 300 approximately 80 cc. FLUOROSCOPY TIME:  Fluoroscopy Time: 13 minutes 54 seconds (3213 mGy). COMPLICATIONS: None immediate. TECHNIQUE: Informed written consent was obtained from the patient after a thorough discussion of the procedural risks, benefits and alternatives. All questions were addressed. Maximal Sterile Barrier Technique was utilized including caps, mask, sterile gowns, sterile gloves, sterile drape, hand hygiene and skin antiseptic. A timeout was performed prior to the initiation of the procedure. The right groin was prepped and draped in the usual sterile fashion. Thereafter  using modified Seldinger technique, transfemoral access into the right common femoral artery was obtained without difficulty. Over a 0.035 inch guidewire, a 5 French Pinnacle sheath was inserted. Through this, and also over 0.035 inch guidewire, a 5 [place] JB 1 catheter was advanced to the aortic arch region and selectively positioned in the right common carotid artery, the right external carotid artery, the right vertebral artery, the left common carotid artery and the left vertebral artery. FINDINGS: The right common carotid arteriogram demonstrates the right external carotid artery and its major branches to be widely patent. A selective right external carotid arteriogram demonstrates no abnormal arteriovenous shunting between the extracranial arterial and venous systems, and also the dural venous drainage. The right internal carotid artery at the bulb to the cranial skull base opacifies normally. The petrous, cavernous and supraclinoid segments demonstrate wide patency. The right middle cerebral artery and the right anterior cerebral artery demonstrate  wide patency. However, mass effect on the perisylvian triangle being pushed superiorly and somewhat medially secondary to the large right temporal hematoma. No evidence of tumor blush, or of stasis of contrast is noted in this region. The venous phase demonstrates delayed clearance of contrast from the venules. The drainage on the venous side is primarily into the left transverse sinus. The right transverse sinus and the right sigmoid sinus do not opacify. There is a significantly attenuated right internal jugular vein. The right vertebral artery origin is widely patent. The vessel is seen to opacify normally to the cranial skull base. Wide patency is seen of the right posterior-cerebellar artery and the right vertebrorbasilar junction. The opacified portion of the basilar artery, the posterior cerebral arteries, superior cerebellar arteries and the  anterior-inferior cerebellar arteries is noted into the capillary and venous phases. Non-opacified blood is seen in the basilar artery from the contralateral vertebral artery. The venous phase again demonstrates nonvisualization of the right transverse sinus and the right sigmoid sinus. Contrast is noted into the left transverse sinus, the left sigmoid sinus and the internal jugular vein. There appears to be stasis with prolonged clearance of the veins overlying the right cerebellar hemisphere underlying the tentorium cerebellum and the posterior aspect of the medial right occipital lobe. The left common carotid arteriogram demonstrates the left external carotid artery and its major branches to be widely patent. The left internal carotid artery at the bulb to the cranial skull base opacifies widely. The petrous, cavernous and supraclinoid segments demonstrate wide patency. A left posterior communicating artery is seen opacifying the left posterior cerebral artery distribution. There is approximately 50% stenosis of the mid M1 segment of the left middle cerebral artery. The MCA trifurcation branches and the left anterior cerebral artery, otherwise, opacify into the capillary and venous phases. Again demonstrated is nonvisualization of the right transverse and sigmoid sinuses. The origin of left vertebral artery demonstrates patency though with mild tortuosity. The vessel, otherwise, is seen to opacify normally to the cranial skull base. The left posterior-inferior cerebellar artery and left vertebrobasilar junction demonstrate wide patency. The opacified portion of the basilar artery, the posterior cerebral arteries, the superior cerebellar arteries and the anterior-inferior cerebellar arteries progress into the capillary and venous phases. Again noted is nonvisualization of the right transverse sinus and the right sigmoid sinus. IMPRESSION: Angiographically nonvisualization of the right transverse sinus and sigmoid  sinus with a severely attenuated caliber of the right internal jugular vein. This is very suspicious related to dural sinus thrombosis. Slow egress of contrast overlying the right cortical cerebral hemisphere in the venous phase consistent with venous congestion, possibly related to the large temporal hematoma. Suspicion remains of a raised venous pressure overlying the right cerebral hemisphere. No angiographic evidence of extracranial or intracranial arteriovenous shunting at this time. PLAN: Findings reviewed with the patient's family and referring neurologist. Suggest repeat arteriogram in about 6-8 weeks to ensure no evidence of arteriovenous fistulous communications intracranially or extra cranially. And also to evaluate for evidence of recanalization of the occluded right transverse sinus and right sigmoid sinus. Electronically Signed   By: Luanne Bras M.D.   On: 07/10/2017 14:45   Ir Angio External Carotid Sel Ext Carotid Uni R Mod Sed  Result Date: 07/11/2017 CLINICAL DATA:  Progressive headaches. Right temporal hemorrhage. Thrombosis within the right transverse sinus and right sigmoid sinus. EXAM: IR ANGIO EXTERNAL CAROTID SEL EXT CAROTID UNI RIGHT MOD SED COMPARISON:  CT of the head, and MRI of the brain  of 07/10/2017. MEDICATIONS: No heparin was given. No antibiotic was administered within 1 hour of the procedure. ANESTHESIA/SEDATION: Versed 1 mg IV; Fentanyl 50 mcg IV. Moderate Sedation Time:  60 minutes. The patient was continuously monitored during the procedure by the interventional radiology nurse under my direct supervision. CONTRAST:  Isovue 300 approximately 80 cc. FLUOROSCOPY TIME:  Fluoroscopy Time: 13 minutes 54 seconds (3213 mGy). COMPLICATIONS: None immediate. TECHNIQUE: Informed written consent was obtained from the patient after a thorough discussion of the procedural risks, benefits and alternatives. All questions were addressed. Maximal Sterile Barrier Technique was utilized  including caps, mask, sterile gowns, sterile gloves, sterile drape, hand hygiene and skin antiseptic. A timeout was performed prior to the initiation of the procedure. The right groin was prepped and draped in the usual sterile fashion. Thereafter using modified Seldinger technique, transfemoral access into the right common femoral artery was obtained without difficulty. Over a 0.035 inch guidewire, a 5 French Pinnacle sheath was inserted. Through this, and also over 0.035 inch guidewire, a 5 [place] JB 1 catheter was advanced to the aortic arch region and selectively positioned in the right common carotid artery, the right external carotid artery, the right vertebral artery, the left common carotid artery and the left vertebral artery. FINDINGS: The right common carotid arteriogram demonstrates the right external carotid artery and its major branches to be widely patent. A selective right external carotid arteriogram demonstrates no abnormal arteriovenous shunting between the extracranial arterial and venous systems, and also the dural venous drainage. The right internal carotid artery at the bulb to the cranial skull base opacifies normally. The petrous, cavernous and supraclinoid segments demonstrate wide patency. The right middle cerebral artery and the right anterior cerebral artery demonstrate wide patency. However, mass effect on the perisylvian triangle being pushed superiorly and somewhat medially secondary to the large right temporal hematoma. No evidence of tumor blush, or of stasis of contrast is noted in this region. The venous phase demonstrates delayed clearance of contrast from the venules. The drainage on the venous side is primarily into the left transverse sinus. The right transverse sinus and the right sigmoid sinus do not opacify. There is a significantly attenuated right internal jugular vein. The right vertebral artery origin is widely patent. The vessel is seen to opacify normally to the  cranial skull base. Wide patency is seen of the right posterior-cerebellar artery and the right vertebrorbasilar junction. The opacified portion of the basilar artery, the posterior cerebral arteries, superior cerebellar arteries and the anterior-inferior cerebellar arteries is noted into the capillary and venous phases. Non-opacified blood is seen in the basilar artery from the contralateral vertebral artery. The venous phase again demonstrates nonvisualization of the right transverse sinus and the right sigmoid sinus. Contrast is noted into the left transverse sinus, the left sigmoid sinus and the internal jugular vein. There appears to be stasis with prolonged clearance of the veins overlying the right cerebellar hemisphere underlying the tentorium cerebellum and the posterior aspect of the medial right occipital lobe. The left common carotid arteriogram demonstrates the left external carotid artery and its major branches to be widely patent. The left internal carotid artery at the bulb to the cranial skull base opacifies widely. The petrous, cavernous and supraclinoid segments demonstrate wide patency. A left posterior communicating artery is seen opacifying the left posterior cerebral artery distribution. There is approximately 50% stenosis of the mid M1 segment of the left middle cerebral artery. The MCA trifurcation branches and the left anterior cerebral artery, otherwise,  opacify into the capillary and venous phases. Again demonstrated is nonvisualization of the right transverse and sigmoid sinuses. The origin of left vertebral artery demonstrates patency though with mild tortuosity. The vessel, otherwise, is seen to opacify normally to the cranial skull base. The left posterior-inferior cerebellar artery and left vertebrobasilar junction demonstrate wide patency. The opacified portion of the basilar artery, the posterior cerebral arteries, the superior cerebellar arteries and the anterior-inferior  cerebellar arteries progress into the capillary and venous phases. Again noted is nonvisualization of the right transverse sinus and the right sigmoid sinus. IMPRESSION: Angiographically nonvisualization of the right transverse sinus and sigmoid sinus with a severely attenuated caliber of the right internal jugular vein. This is very suspicious related to dural sinus thrombosis. Slow egress of contrast overlying the right cortical cerebral hemisphere in the venous phase consistent with venous congestion, possibly related to the large temporal hematoma. Suspicion remains of a raised venous pressure overlying the right cerebral hemisphere. No angiographic evidence of extracranial or intracranial arteriovenous shunting at this time. PLAN: Findings reviewed with the patient's family and referring neurologist. Suggest repeat arteriogram in about 6-8 weeks to ensure no evidence of arteriovenous fistulous communications intracranially or extra cranially. And also to evaluate for evidence of recanalization of the occluded right transverse sinus and right sigmoid sinus. Electronically Signed   By: Luanne Bras M.D.   On: 07/10/2017 14:45   TTE - Very technically difficult study with poor endocardial   definition. Definity contrast was not given. LVEF is grossly   normal at 55-60%, mild LVH, inadequate for wall motion, grade 1   DD with indeterminate LV filling pressure.   PHYSICAL EXAM  Temp:  [97.7 F (36.5 C)-99.7 F (37.6 C)] 97.7 F (36.5 C) (02/02 0700) Pulse Rate:  [54-100] 62 (02/02 0600) Resp:  [14-30] 16 (02/02 0600) BP: (119-148)/(60-115) 135/115 (02/02 0600) SpO2:  [88 %-96 %] 96 % (02/02 0600)  General - Well nourished, well developed, in no apparent distress.  Ophthalmologic - fundi not visualized due to noncooperation.  Cardiovascular - Regular rate and rhythm with no murmur.  Mental Status: Alert, oriented, thought content appropriate.  Speech fluent without evidence of aphasia.   Able to follow all commands without difficulty. Cranial Nerves: II: Visual fields with subtle left homonymous inferior quadrantanopsia. PERRL.  III,IV, VI: ptosis not present, EOMI without nystagmus.  V,VII: No facial droop. Temp sensation normal bilaterally VIII: Hearing intact to conversation IX,X: Palate rises symmetrically XI: Symmetric XII: Midline tongue extension Motor: Right :  Upper extremity   5/5                                      Left:     Upper extremity   5/5             Lower extremity   5/5                                                  Lower extremity   5/5 Normal tone throughout; no atrophy noted Sensory: Temp and light touch intact x 4 Deep Tendon Reflexes: 2+ and symmetric throughout Plantars: Right: downgoing               Left: downgoing Cerebellar: No ataxia with FNF bilaterally  Gait: Deferred  ASSESSMENT/PLAN Mr. KIING DEAKIN is a 72 y.o. male with history of prostate cancer, HTN, HLD, polycythemia, OSA admitted for right frontal headache and nausea. No tPA given due to [place].    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  Resultant HA much improved.  MRI  Right temporal ICH with likely right transverse sinus thrombosis  MRA  No AVM or aneurysm  MRV  thrombosis of the right transverse sinus, sigmoid sinus and jugular vein  DSA - Occluded RT Transverse and RT sigmoid sinus and poorly opacified RT internal jugular vein.  2D Echo EF 55%  Pan CT  Showed no malignancy but left kidney stone and hydronephrolsis  LDL 103  HgbA1c 5.3  Heparin IV for VTE prophylaxis Fall precautions  Diet Heart Room service appropriate? Yes; Fluid consistency: Thin   No antithrombotic prior to admission, now on heparin IV low intensity with heparin level 0.3-0.5 without  bolus.   Ongoing aggressive stroke risk factor management  Therapy recommendations:  pending  Disposition:  Pending  ICH - right temporal due to venous thrombosis  Heparin IV, low intensity without bolus  Close neuro monitoring  BP goal < 160  Repeat CT pending  If any neuro changes, stop heparin and stat head CT  Hypertension Stable BP goal < 160 Off cleviprex Add amlodipine and lisinopril  Long term BP goal normotensive  Hyperlipidemia  Home meds:  None   LDL 103, goal < 70  Start lipitor 20mg  daily  Continue stain on discharge  Other Stroke Risk Factors  Advanced age  Obesity, Body mass index is 32.87 kg/m.   Obstructive sleep apnea, on CPAP at home  Polycythemia - Hb 17.5->17.5->16.2->16.3  Other Active Problems  Hx of prostate cancer s/p surgery  Hypokalemia - supplement  Hospital day # 2  This patient is critically ill due to venous thrombosis with ICH, cerebral edema, hypertension and at significant risk of neurological worsening, death form recurrent bleeding, hematoma expansion, thrombosis extension, hypertensive emergency, heart failure. This patient's care requires constant monitoring of vital signs, hemodynamics, respiratory and cardiac monitoring, review of multiple databases, neurological assessment, discussion with family, other specialists and medical decision making of high complexity. I had long discussion with wife at bedside and later over the phone, updated pt current condition, treatment plan and potential prognosis. They expressed understanding and appreciation. I spent 35 minutes of neurocritical care time in the care of this patient.  Rosalin Hawking, MD PhD Stroke Neurology 07/12/2017 11:38 AM     To contact Stroke Continuity provider, please refer to [email or web address] After hours, contact General Neurology

## 2017-07-13 DIAGNOSIS — G08 Intracranial and intraspinal phlebitis and thrombophlebitis: Secondary | ICD-10-CM

## 2017-07-13 LAB — CBC
HEMATOCRIT: 48.3 % (ref 39.0–52.0)
HEMOGLOBIN: 16.2 g/dL (ref 13.0–17.0)
MCH: 29 pg (ref 26.0–34.0)
MCHC: 33.5 g/dL (ref 30.0–36.0)
MCV: 86.4 fL (ref 78.0–100.0)
Platelets: 205 10*3/uL (ref 150–400)
RBC: 5.59 MIL/uL (ref 4.22–5.81)
RDW: 13.8 % (ref 11.5–15.5)
WBC: 7.2 10*3/uL (ref 4.0–10.5)

## 2017-07-13 LAB — BASIC METABOLIC PANEL
Anion gap: 10 (ref 5–15)
BUN: 17 mg/dL (ref 6–20)
CO2: 22 mmol/L (ref 22–32)
Calcium: 8.3 mg/dL — ABNORMAL LOW (ref 8.9–10.3)
Chloride: 109 mmol/L (ref 101–111)
Creatinine, Ser: 0.9 mg/dL (ref 0.61–1.24)
GFR calc Af Amer: >60 mL/min (ref >60)
GFR calc non Af Amer: >60 mL/min (ref >60)
Glucose, Bld: 99 mg/dL (ref 65–99)
Potassium: 3.7 mmol/L (ref 3.5–5.1)
Sodium: 141 mmol/L (ref 135–145)

## 2017-07-13 NOTE — Progress Notes (Signed)
STROKE TEAM PROGRESS NOTE   SUBJECTIVE (INTERVAL HISTORY) His wife is at the bedside.  Pt awake alert orientated. Repeat head CT yesterday showed no further bleeding and no change hematoma. switched from heparin IV to lovenox.    OBJECTIVE Temp:  [98 F (36.7 C)-99 F (37.2 C)] 98.5 F (36.9 C) (02/03 0726) Pulse Rate:  [58-93] 75 (02/03 1100) Cardiac Rhythm: Normal sinus rhythm (02/03 0800) Resp:  [13-30] 19 (02/03 1100) BP: (91-161)/(45-95) 133/70 (02/03 1100) SpO2:  [88 %-95 %] 93 % (02/03 1100)  Recent Labs  Lab 07/10/17 1415  GLUCAP 111*   Recent Labs  Lab 07/09/17 2049 07/11/17 0303 07/12/17 0232 07/13/17 0342  NA 138 139 141 141  K 4.1 3.7 3.4* 3.7  CL 103 109 108 109  CO2 22 21* 21* 22  GLUCOSE 134* 110* 91 99  BUN 15 25* 20 17  CREATININE 0.84 0.96 0.81 0.90  CALCIUM 8.9 8.4* 8.2* 8.3*   No results for input(s): AST, ALT, ALKPHOS, BILITOT, PROT, ALBUMIN in the last 168 hours. Recent Labs  Lab 07/09/17 2049 07/11/17 0303 07/12/17 0232 07/13/17 0342  WBC 9.8 11.5* 9.2 7.2  NEUTROABS 8.4*  --   --   --   HGB 17.5* 16.2 16.3 16.2  HCT 51.6 47.2 48.2 48.3  MCV 86.4 84.6 85.3 86.4  PLT 209 227 211 205   No results for input(s): CKTOTAL, CKMB, CKMBINDEX, TROPONINI in the last 168 hours. No results for input(s): LABPROT, INR in the last 72 hours. No results for input(s): COLORURINE, LABSPEC, [place], GLUCOSEU, HGBUR, BILIRUBINUR, KETONESUR, PROTEINUR, UROBILINOGEN, NITRITE, LEUKOCYTESUR in the last 72 hours.  Invalid input(s): APPERANCEUR     Component Value Date/Time   CHOL 159 07/11/2017 0303   TRIG 78 07/11/2017 0303   HDL 36 (L) 07/11/2017 0303   CHOLHDL 4.4 07/11/2017 0303   VLDL 16 07/11/2017 0303   LDLCALC 107 (H) 07/11/2017 0303   Lab Results  Component Value Date   HGBA1C 5.3 07/11/2017   No results found for: LABOPIA, COCAINSCRNUR, LABBENZ, AMPHETMU, THCU, LABBARB  No results for input(s): ETH in the last 168 hours.  I have personally  reviewed the radiological images below and agree with the radiology interpretations.  Ct Head Wo Contrast  Result Date: 07/12/2017 CLINICAL DATA:  Continued surveillance venous thrombosis. EXAM: CT HEAD WITHOUT CONTRAST TECHNIQUE: Contiguous axial images were obtained from the base of the skull through the vertex without intravenous contrast. COMPARISON:  Multiple priors. FINDINGS: Brain: Unchanged RIGHT temporal lobe intraparenchymal hemorrhage/venous infarct, mild surrounding edema. Minor mass effect, but no significant midline shift. Cross-section of 31 x 56 mm on image 14 indicates interval stability compared with 07/10/2017. Trace layering blood in the ventricles, but no hydrocephalus. Mild atrophy and chronic microvascular ischemic change, stable. Vascular: Calcification of the cavernous internal carotid arteries consistent with cerebrovascular atherosclerotic disease. No signs of intracranial large vessel occlusion. Skull: Calvarium intact. Sinuses/Orbits: Slight fluid in the RIGHT maxillary and BILATERAL ethmoid air cells, but no significant opacity. Orbits are unremarkable. Other: No mastoid or middle ear fluid. IMPRESSION: Stable RIGHT temporal lobe ICH. Mild surrounding edema. No new findings. Electronically Signed   By: Staci Righter M.D.   On: 07/12/2017 12:50   Ct Head Wo Contrast  Result Date: 07/10/2017 CLINICAL DATA:  72 year old male with right temporal lobe intra-axial hemorrhage diagnosed yesterday after presentation with persistent headache. EXAM: CT HEAD WITHOUT CONTRAST TECHNIQUE: Contiguous axial images were obtained from the base of the skull through the vertex without  intravenous contrast. COMPARISON:  Head CT without contrast 0152 hr today. Brain MRI 07/09/2017. FINDINGS: Brain: Mostly hyperdense intra-axial hemorrhage within the right temporal lobe centrally and involving the inferior middle and superior temporal gyri. Hemorrhage encompasses 62 by 31 by 38 millimeters (AP by  transverse by CC) for an estimated blood volume of 37 milliliters -unchanged from the 07/09/2017 MRI. Surrounding edema and regional mass effect remain stable. No intraventricular extension. The small 2-3 millimeter superimposed right subdural hematoma more apparent by MRI is stable. Stable trace leftward midline shift. Basilar cisterns remain stable. Stable ventricle size and configuration. Stable gray-white matter differentiation throughout the brain. No new cortically based infarct identified. Vascular: Asymmetric hyperdensity of the right transverse and sigmoid sinuses. Corresponding to loss of enhancement and filling defect on MRI yesterday. Calcified atherosclerosis at the skull base. Skull: Stable and negative. Sinuses/Orbits: Stable. Widespread ethmoid mucosal thickening. Bilateral maxillary sinus bubbly opacity. Tympanic cavities and mastoids remain clear. Other: Stable orbit and scalp soft tissues. IMPRESSION: 1. Right transverse and sigmoid sinus thrombosis with acute right temporal lobe intra-axial hemorrhage and small/trace right subdural hematoma. 2. Stable parenchymal hemorrhage volume since the MRI 07/09/2017, estimated at 37 mL. Stable surrounding edema and mild regional mass effect. 3. Stable trace right lateral subdural hematoma. No intraventricular hemorrhage extension or ventriculomegaly. No increasing intracranial mass effect. Stable trace leftward midline shift. 4. No new No acute intracranial abnormality. Electronically Signed   By: Genevie Ann M.D.   On: 07/10/2017 08:39   Ct Head Wo Contrast  Result Date: 07/10/2017 CLINICAL DATA:  72 y/o M; 2 weeks of headache and 2 days of nausea, suspect intracranial hemorrhage. EXAM: CT HEAD WITHOUT CONTRAST TECHNIQUE: Contiguous axial images were obtained from the base of the skull through the vertex without intravenous contrast. COMPARISON:  07/09/2017 MRI head FINDINGS: Brain: Right temporal lobe hematoma spanning 6.0 x 2.7 x 2.7 cm (volume = 23  cm^3)(AP x ML x CC series 5, image 41 and series 6, image 17). Surrounding edema and result in mass effect results in minimal right-sided uncal herniation, partial effacement of the right lateral ventricle, and 3 mm of right-to-left midline shift. There is a small volume of subarachnoid hemorrhage overlying the right temporal lobe and trace volume of hemorrhage within occipital horns of lateral ventricles likely due to redistribution. Findings are stable from prior MRI of the brain given differences in technique. No additional acute intracranial hemorrhage, large territory infarct, or focal mass effect is identified. Background of mild chronic microvascular ischemic changes and parenchymal volume loss of the brain. Vascular: No hyperdense vessel or unexpected calcification. Skull: Normal. Negative for fracture or focal lesion. Sinuses/Orbits: Moderate diffuse paranasal sinus mucosal thickening with aerosolized secretions in the maxillary sinuses. Normal aeration of mastoid air cells. Bilateral intra-ocular lens replacement. Other: None. IMPRESSION: 1. Stable mixed attenuation hematoma in the right temporal lobe measuring up to 6 cm, 23 cc. Stable mass effect with 3 mm right-to-left midline shift. Stable small volume subarachnoid hemorrhage over right convexity and trace intraventricular hemorrhage likely due to redistribution. 2. No new acute intracranial abnormality identified. These results were called by telephone at the time of interpretation on 07/10/2017 at 2:14 am to Dr. Kerney Elbe , who verbally acknowledged these results. Electronically Signed   By: Kristine Garbe M.D.   On: 07/10/2017 02:13   Ct Chest W Contrast  Result Date: 07/11/2017 CLINICAL DATA:  Prostate cancer. Thrombosis of the right transverse sinus. Evaluate for occult malignancy. EXAM: CT CHEST, ABDOMEN, AND PELVIS WITH  CONTRAST TECHNIQUE: Multidetector CT imaging of the chest, abdomen and pelvis was performed following the  standard protocol during bolus administration of intravenous contrast. CONTRAST:  133mL ISOVUE-300 IOPAMIDOL (ISOVUE-300) INJECTION 61% COMPARISON:  Abdomen and pelvis CT 06/14/2009 FINDINGS: CT CHEST FINDINGS Cardiovascular: The heart size is normal. No pericardial effusion. Coronary artery calcification is evident. Mediastinum/Nodes: No mediastinal lymphadenopathy. There is no hilar lymphadenopathy. The esophagus has normal imaging features. There is no axillary lymphadenopathy. Lungs/Pleura: Areas of atelectasis noted in the lower lobes bilaterally and in the lingula. No pulmonary edema or pleural effusion. No suspicious pulmonary nodule or mass given limitations of the atelectasis in the posterior lower lobes. Musculoskeletal: Bone windows reveal no worrisome lytic or sclerotic osseous lesions. CT ABDOMEN PELVIS FINDINGS Hepatobiliary: No focal abnormality within the liver parenchyma. Gallbladder surgically absent. No intrahepatic or extrahepatic biliary dilation. Pancreas: No focal mass lesion. No dilatation of the main duct. No intraparenchymal cyst. No peripancreatic edema. Spleen: No splenomegaly. No focal mass lesion. Adrenals/Urinary Tract: No adrenal nodule or mass. Right kidney unremarkable. No right hydroureteronephrosis. There atrophy of the left kidney with left hydronephrosis, new in the long interval since prior study. 13 mm nonobstructing stone identified in the left renal pelvis with 9 mm nonobstructing stone identified in the lower pole. Left ureter is dilated down to its mid segment where there is abrupt transition to nondilated ureter. No obstructing lesion is evident at the transition point. The urinary bladder appears normal for the degree of distention. Stomach/Bowel: Stomach is nondistended. No gastric wall thickening. No evidence of outlet obstruction. Duodenum is normally positioned as is the ligament of Treitz. Duodenal diverticulum noted. No small bowel wall thickening. No small bowel  dilatation. The terminal ileum is normal. The appendix is not visualized, but there is no edema or inflammation in the region of the cecum. No gross colonic mass. No colonic wall thickening. No substantial diverticular change. Vascular/Lymphatic: There is abdominal aortic atherosclerosis without aneurysm. There is no gastrohepatic or hepatoduodenal ligament lymphadenopathy. No intraperitoneal or retroperitoneal lymphadenopathy. No pelvic sidewall lymphadenopathy. Reproductive: Prostate gland surgically absent. Other: No intraperitoneal free fluid. Musculoskeletal: There minimal edema in the right groin compatible with recent vascular access. IMPRESSION: 1. No evidence for neoplasm in the chest, abdomen, or pelvis. 2. Left hydroureteronephrosis with left ureter dilated to the mid segment where there is abrupt transition to nondilated ureter. No obstructing stone or soft tissue lesion evident by CT. Overlying left renal atrophy suggests chronic process. 2 stones are seen in the left kidney. 3. Status post prostatectomy. No lymphadenopathy in the abdomen or pelvis. 4. Subsegmental atelectasis in both lower lobes and lingula. Electronically Signed   By: Misty Stanley M.D.   On: 07/11/2017 14:21   Mr Jodene Nam Head Wo Contrast  Result Date: 07/10/2017 CLINICAL DATA:  Right temporal lobe intraparenchymal hemorrhage with suspicion of venous thrombosis. EXAM: MRA head without CONTRAST MRV HEAD WITHOUT CONTRAST TECHNIQUE: Multiplanar, multiecho pulse sequences of the brain and surrounding structures were obtained with intravenous contrast. Angiographic images of the intracranial venous structures were obtained using MRV technique without intravenous contrast. COMPARISON:  MRI 07/09/2017.  CT studies earlier same day. FINDINGS: MRA HEAD FINDINGS Some motion degradation. Both internal carotid arteries are widely patent through the skull base and siphon regions. Left anterior and middle cerebral vessels are normal without  proximal stenosis, aneurysm or vascular malformation. On the right, the anterior cerebral vessels appear normal. The right M1 segment is patent. There is distortion of the right middle cerebral artery branches  secondary to the intraparenchymal hematoma. I do not suspect a primary arterial abnormality. No sign of aneurysm or high flow vascular malformation. Posterior circulation vessels appear normal. Both vertebral arteries widely patent to the basilar. No basilar stenosis. Posterior circulation branch vessels appear normal, with some motion degradation. MRV HEAD FINDINGS Superior sagittal sinus is patent. Deep veins are patent. Left transverse sinus, sigmoid sinus and jugular vein are patent. There thrombosis of the right transverse sinus, sigmoid sinus and jugular vein. IMPRESSION: MR venography confirmed thrombosis of the right transverse sinus, sigmoid sinus and jugular vein. Superior sagittal sinus, deep venous system and left sided drainage are patent. MR angiography does not show any primary arterial side pathology. There is diminished visualization of the right MCA branches due to the regional hematoma. No sign of aneurysm or high flow vascular abnormality. Electronically Signed   By: Nelson Chimes M.D.   On: 07/10/2017 11:04   Mr Jeri Cos NF Contrast  Result Date: 07/09/2017 CLINICAL DATA:  Initial evaluation for persistent headaches for past 2 weeks EXAM: MRI HEAD WITHOUT AND WITH CONTRAST TECHNIQUE: Multiplanar, multiecho pulse sequences of the brain and surrounding structures were obtained without and with intravenous contrast. CONTRAST:  20 cc of MultiHance. COMPARISON:  None available. FINDINGS: Brain: Study moderately degraded by motion artifact. Generalized age-related cerebral atrophy. Patchy T2/FLAIR hyperintensity within the periventricular and deep white matter both cerebral hemispheres, most consistent with chronic small vessel ischemic disease, mild in nature. There is an acute to subacute  appearing intraparenchymal hematoma centered at the peripheral right temporal lobe measuring approximately 6.1 x 3.1 x 3.8 cm (series 8, image 11). Surrounding rim of vasogenic edema with mild regional mass effect. Associated small volume subarachnoid hemorrhage within the adjacent right frontotemporal region, evident on SWI sequence (series 10, image 52). Additionally, evidence for extra-axial extension with a in 3 mm right holo hemispheric subdural hematoma. Trace layering blood products within the occipital horns of both lateral ventricles, suspected to be related to redistribution (series 10, image 33). Mild mass effect on the right lateral ventricle which is partially attenuated. Trace 3 mm right-to-left shift. No hydrocephalus or ventricular trapping. Basilar cisterns are patent. Scattered leptomeningeal enhancement within the right frontotemporal region likely reactive. No discernible mass lesion seen underlying the intraparenchymal hematoma. Abnormal flow void within the right transverse and sigmoid sinus, suspicious for possible venous sinus thrombosis (series 7, image 8). No evidence for acute infarct. Gray-white matter differentiation otherwise maintained. No other encephalomalacia to suggest chronic infarction. No other mass lesion. Pituitary gland within normal limits. Midline structures intact and normal. Vascular: Major arterial vascular flow voids maintained at the skull base. Skull and upper cervical spine: Craniocervical junction within normal limits. Upper cervical spine normal. Bone marrow signal intensity within normal limits. No scalp soft tissue abnormality. Sinuses/Orbits: Globes and orbital soft tissues within normal limits. Patient status post lens extraction bilaterally. Scattered mucosal thickening within the maxillary sinuses and ethmoidal air cells. Trace bilateral mastoid effusions noted. Other: None. IMPRESSION: 1. 6.1 x 3.1 x 3.8 cm acute to subacute intraparenchymal hematoma  positioned within the anterior right temporal lobe. Associated edema with regional mass effect with trace 3 mm right-to-left midline shift. 2. Associated small volume subarachnoid hemorrhage within the adjacent right frontotemporal region. Trace intraventricular blood products suspected to be related to redistribution. 3. Evidence for intra-axial extension with small 3 mm right holo hemispheric subdural hematoma. 4. Abnormal flow void within the right transverse and sigmoid sinuses, suspicious for venous sinus thrombosis. Findings suspected to  be the underlying etiology for the intraparenchymal hemorrhage. 5. Mild chronic small vessel ischemic disease. Critical Value/emergent results were called by telephone at the time of interpretation on 07/09/2017 at 9:19 pm to Dr. Tomi Bamberger , who verbally acknowledged these results. Electronically Signed   By: Jeannine Boga M.D.   On: 07/09/2017 21:20   Ct Abdomen Pelvis W Contrast  Result Date: 07/11/2017 CLINICAL DATA:  Prostate cancer. Thrombosis of the right transverse sinus. Evaluate for occult malignancy. EXAM: CT CHEST, ABDOMEN, AND PELVIS WITH CONTRAST TECHNIQUE: Multidetector CT imaging of the chest, abdomen and pelvis was performed following the standard protocol during bolus administration of intravenous contrast. CONTRAST:  135mL ISOVUE-300 IOPAMIDOL (ISOVUE-300) INJECTION 61% COMPARISON:  Abdomen and pelvis CT 06/14/2009 FINDINGS: CT CHEST FINDINGS Cardiovascular: The heart size is normal. No pericardial effusion. Coronary artery calcification is evident. Mediastinum/Nodes: No mediastinal lymphadenopathy. There is no hilar lymphadenopathy. The esophagus has normal imaging features. There is no axillary lymphadenopathy. Lungs/Pleura: Areas of atelectasis noted in the lower lobes bilaterally and in the lingula. No pulmonary edema or pleural effusion. No suspicious pulmonary nodule or mass given limitations of the atelectasis in the posterior lower lobes.  Musculoskeletal: Bone windows reveal no worrisome lytic or sclerotic osseous lesions. CT ABDOMEN PELVIS FINDINGS Hepatobiliary: No focal abnormality within the liver parenchyma. Gallbladder surgically absent. No intrahepatic or extrahepatic biliary dilation. Pancreas: No focal mass lesion. No dilatation of the main duct. No intraparenchymal cyst. No peripancreatic edema. Spleen: No splenomegaly. No focal mass lesion. Adrenals/Urinary Tract: No adrenal nodule or mass. Right kidney unremarkable. No right hydroureteronephrosis. There atrophy of the left kidney with left hydronephrosis, new in the long interval since prior study. 13 mm nonobstructing stone identified in the left renal pelvis with 9 mm nonobstructing stone identified in the lower pole. Left ureter is dilated down to its mid segment where there is abrupt transition to nondilated ureter. No obstructing lesion is evident at the transition point. The urinary bladder appears normal for the degree of distention. Stomach/Bowel: Stomach is nondistended. No gastric wall thickening. No evidence of outlet obstruction. Duodenum is normally positioned as is the ligament of Treitz. Duodenal diverticulum noted. No small bowel wall thickening. No small bowel dilatation. The terminal ileum is normal. The appendix is not visualized, but there is no edema or inflammation in the region of the cecum. No gross colonic mass. No colonic wall thickening. No substantial diverticular change. Vascular/Lymphatic: There is abdominal aortic atherosclerosis without aneurysm. There is no gastrohepatic or hepatoduodenal ligament lymphadenopathy. No intraperitoneal or retroperitoneal lymphadenopathy. No pelvic sidewall lymphadenopathy. Reproductive: Prostate gland surgically absent. Other: No intraperitoneal free fluid. Musculoskeletal: There minimal edema in the right groin compatible with recent vascular access. IMPRESSION: 1. No evidence for neoplasm in the chest, abdomen, or pelvis.  2. Left hydroureteronephrosis with left ureter dilated to the mid segment where there is abrupt transition to nondilated ureter. No obstructing stone or soft tissue lesion evident by CT. Overlying left renal atrophy suggests chronic process. 2 stones are seen in the left kidney. 3. Status post prostatectomy. No lymphadenopathy in the abdomen or pelvis. 4. Subsegmental atelectasis in both lower lobes and lingula. Electronically Signed   By: Misty Stanley M.D.   On: 07/11/2017 14:21   Mr Mrv Head Wo Cm  Result Date: 07/10/2017 CLINICAL DATA:  Right temporal lobe intraparenchymal hemorrhage with suspicion of venous thrombosis. EXAM: MRA head without CONTRAST MRV HEAD WITHOUT CONTRAST TECHNIQUE: Multiplanar, multiecho pulse sequences of the brain and surrounding structures were obtained with intravenous  contrast. Angiographic images of the intracranial venous structures were obtained using MRV technique without intravenous contrast. COMPARISON:  MRI 07/09/2017.  CT studies earlier same day. FINDINGS: MRA HEAD FINDINGS Some motion degradation. Both internal carotid arteries are widely patent through the skull base and siphon regions. Left anterior and middle cerebral vessels are normal without proximal stenosis, aneurysm or vascular malformation. On the right, the anterior cerebral vessels appear normal. The right M1 segment is patent. There is distortion of the right middle cerebral artery branches secondary to the intraparenchymal hematoma. I do not suspect a primary arterial abnormality. No sign of aneurysm or high flow vascular malformation. Posterior circulation vessels appear normal. Both vertebral arteries widely patent to the basilar. No basilar stenosis. Posterior circulation branch vessels appear normal, with some motion degradation. MRV HEAD FINDINGS Superior sagittal sinus is patent. Deep veins are patent. Left transverse sinus, sigmoid sinus and jugular vein are patent. There thrombosis of the right  transverse sinus, sigmoid sinus and jugular vein. IMPRESSION: MR venography confirmed thrombosis of the right transverse sinus, sigmoid sinus and jugular vein. Superior sagittal sinus, deep venous system and left sided drainage are patent. MR angiography does not show any primary arterial side pathology. There is diminished visualization of the right MCA branches due to the regional hematoma. No sign of aneurysm or high flow vascular abnormality. Electronically Signed   By: Nelson Chimes M.D.   On: 07/10/2017 11:04   Ir Angio Intra Extracran Sel Internal Carotid Bilat Mod Sed  Result Date: 07/11/2017 CLINICAL DATA:  Worsening headaches. CT scan revealing right temporal hematoma. Thrombus in the right transverse sinus and right sigmoid sinus EXAM: IR ANGIO INTRA EXTRACRAN SEL INTERNAL CAROTID BILAT MOD SED MEDICATIONS: Heparin 1000 units IV; Marland Kitchen The antibiotic was administered within 1 hour of the procedure ANESTHESIA/SEDATION: Versed  mg IV; Fentanyl  mcg IV Moderate Sedation Time:   minutes The patient was continuously monitored during the procedure by the interventional radiology nurse under my direct supervision. FLUOROSCOPY TIME:  Fluoroscopy Time:  minutes  seconds ( mGy). COMPLICATIONS: None immediate. TECHNIQUE: Informed written consent was obtained from the patient after a thorough discussion of the procedural risks, benefits and alternatives. All questions were addressed. Maximal Sterile Barrier Technique was utilized including caps, mask, sterile gowns, sterile gloves, sterile drape, hand hygiene and skin antiseptic. A timeout was performed prior to the initiation of the procedure. The right groin was prepped and draped in the usual sterile fashion. Thereafter using modified Seldinger technique, transfemoral access into the right common femoral artery was obtained without difficulty. Over a 0.035 inch guidewire, a 5 French Pinnacle sheath was inserted. Through this, and also over 0.035 inch guidewire, a 5  [place] 1 catheter was advanced to the aortic arch region and selectively positioned in the right common carotid artery, the right external carotid artery, the right vertebral artery, the left common carotid artery and the left vertebral artery. FINDINGS: Right CCA: The right common carotid arteriogram demonstrates the right external carotid artery and its major branches to be widely patent. Right ICA: The right internal carotid artery at the bulb to the cranial skull base opacifies normally. Right ECA: The selective right external carotid artery injection demonstrates normal opacification of the right external carotid artery branches. Right Intracranial: The right middle cerebral artery and the right anterior cerebral artery opacify normally into the capillary and venous phases. Transient cross filling via the anterior communicating artery of the left anterior cerebral artery A2 segment is noted. Right Sided Anterior  Venous: The petrous, cavernous and supraclinoid segments are widely patent. The venous phase demonstrates preferential egress of contrast into the right transverse sinus, the sigmoid sinus and the right internal jugular vein. There is no early venous shunting into the venous structures at the skull base or intracranially into the dural sinuses. Right Vertebral Extracranial: The origin of the right vertebral artery is normal. The vessel is seen to opacify normally to the cranial skull base. Normal opacification is seen of the right vertebrobasilar junction and the right posterior-inferior cerebellar artery. Right Vertebral Intracranial: The opacified portion of the basilar artery, the right posterior cerebral artery, the superior cerebellar arteries and the anterior-inferior cerebellar arteries is normal into the capillary and venous phases. Non-opacified blood is seen in the basilar artery from the contralateral vertebral artery. Right Sided Posterior Venous: The right transverse sinus and the  sigmoid sinus are normal and widely patent. ________________________________________________________ Left CCA: The left common carotid arteriogram demonstrates the left external carotid artery and its major branches to be widely patent. Left ICA: The left internal carotid artery at the bulb to the cranial skull base opacifies normally. Left ECA: The selective left external carotid artery injection demonstrates normal opacification of the left external carotid artery branches. Left Intracranial: The left middle cerebral artery and the left anterior cerebral artery opacify normally into the capillary and venous phases. Transient cross filling via the anterior communicating artery of the right anterior cerebral artery A2 segment is noted. Left Sided Anterior Venous: The petrous, cavernous and supraclinoid segments are widely patent. The venous phase demonstrates preferential egress of contrast into the left transverse sinus, the sigmoid sinus and the left internal jugular vein. There is no early venous shunting into the venous structures at the skull base or intracranially into the dural sinuses. Left Vertebral Extracranial: The origin of the left vertebral artery is normal. The vessel is seen to opacify normally to the cranial skull base. Normal opacification is seen of the left vertebrobasilar junction and the lfet posterior-inferior cerebellar artery. Left Vertebral Intracranial: The opacified portion of the basilar artery, the left posterior cerebral artery, the superior cerebellar arteries and the anterior-inferior cerebellar arteries is normal into the capillary and venous phases. Non-opacified blood is seen in the basilar artery from the contralateral vertebral artery. Left Sided Posterior Venous: The left transverse sinus and the sigmoid sinus are normal and widely patent. Electronically Signed   By: Luanne Bras M.D.   On: 07/11/2017 09:12   Ir Angio Vertebral Sel Vertebral Bilat Mod Sed  Result  Date: 07/11/2017 CLINICAL DATA:  Progressive headaches. Right temporal hemorrhage. Thrombosis within the right transverse sinus and right sigmoid sinus. EXAM: IR ANGIO EXTERNAL CAROTID SEL EXT CAROTID UNI RIGHT MOD SED COMPARISON:  CT of the head, and MRI of the brain of 07/10/2017. MEDICATIONS: No heparin was given. No antibiotic was administered within 1 hour of the procedure. ANESTHESIA/SEDATION: Versed 1 mg IV; Fentanyl 50 mcg IV. Moderate Sedation Time:  60 minutes. The patient was continuously monitored during the procedure by the interventional radiology nurse under my direct supervision. CONTRAST:  Isovue 300 approximately 80 cc. FLUOROSCOPY TIME:  Fluoroscopy Time: 13 minutes 54 seconds (3213 mGy). COMPLICATIONS: None immediate. TECHNIQUE: Informed written consent was obtained from the patient after a thorough discussion of the procedural risks, benefits and alternatives. All questions were addressed. Maximal Sterile Barrier Technique was utilized including caps, mask, sterile gowns, sterile gloves, sterile drape, hand hygiene and skin antiseptic. A timeout was performed prior to the initiation of  the procedure. The right groin was prepped and draped in the usual sterile fashion. Thereafter using modified Seldinger technique, transfemoral access into the right common femoral artery was obtained without difficulty. Over a 0.035 inch guidewire, a 5 French Pinnacle sheath was inserted. Through this, and also over 0.035 inch guidewire, a 5 [place] JB 1 catheter was advanced to the aortic arch region and selectively positioned in the right common carotid artery, the right external carotid artery, the right vertebral artery, the left common carotid artery and the left vertebral artery. FINDINGS: The right common carotid arteriogram demonstrates the right external carotid artery and its major branches to be widely patent. A selective right external carotid arteriogram demonstrates no abnormal arteriovenous shunting  between the extracranial arterial and venous systems, and also the dural venous drainage. The right internal carotid artery at the bulb to the cranial skull base opacifies normally. The petrous, cavernous and supraclinoid segments demonstrate wide patency. The right middle cerebral artery and the right anterior cerebral artery demonstrate wide patency. However, mass effect on the perisylvian triangle being pushed superiorly and somewhat medially secondary to the large right temporal hematoma. No evidence of tumor blush, or of stasis of contrast is noted in this region. The venous phase demonstrates delayed clearance of contrast from the venules. The drainage on the venous side is primarily into the left transverse sinus. The right transverse sinus and the right sigmoid sinus do not opacify. There is a significantly attenuated right internal jugular vein. The right vertebral artery origin is widely patent. The vessel is seen to opacify normally to the cranial skull base. Wide patency is seen of the right posterior-cerebellar artery and the right vertebrorbasilar junction. The opacified portion of the basilar artery, the posterior cerebral arteries, superior cerebellar arteries and the anterior-inferior cerebellar arteries is noted into the capillary and venous phases. Non-opacified blood is seen in the basilar artery from the contralateral vertebral artery. The venous phase again demonstrates nonvisualization of the right transverse sinus and the right sigmoid sinus. Contrast is noted into the left transverse sinus, the left sigmoid sinus and the internal jugular vein. There appears to be stasis with prolonged clearance of the veins overlying the right cerebellar hemisphere underlying the tentorium cerebellum and the posterior aspect of the medial right occipital lobe. The left common carotid arteriogram demonstrates the left external carotid artery and its major branches to be widely patent. The left internal  carotid artery at the bulb to the cranial skull base opacifies widely. The petrous, cavernous and supraclinoid segments demonstrate wide patency. A left posterior communicating artery is seen opacifying the left posterior cerebral artery distribution. There is approximately 50% stenosis of the mid M1 segment of the left middle cerebral artery. The MCA trifurcation branches and the left anterior cerebral artery, otherwise, opacify into the capillary and venous phases. Again demonstrated is nonvisualization of the right transverse and sigmoid sinuses. The origin of left vertebral artery demonstrates patency though with mild tortuosity. The vessel, otherwise, is seen to opacify normally to the cranial skull base. The left posterior-inferior cerebellar artery and left vertebrobasilar junction demonstrate wide patency. The opacified portion of the basilar artery, the posterior cerebral arteries, the superior cerebellar arteries and the anterior-inferior cerebellar arteries progress into the capillary and venous phases. Again noted is nonvisualization of the right transverse sinus and the right sigmoid sinus. IMPRESSION: Angiographically nonvisualization of the right transverse sinus and sigmoid sinus with a severely attenuated caliber of the right internal jugular vein. This is very suspicious related  to dural sinus thrombosis. Slow egress of contrast overlying the right cortical cerebral hemisphere in the venous phase consistent with venous congestion, possibly related to the large temporal hematoma. Suspicion remains of a raised venous pressure overlying the right cerebral hemisphere. No angiographic evidence of extracranial or intracranial arteriovenous shunting at this time. PLAN: Findings reviewed with the patient's family and referring neurologist. Suggest repeat arteriogram in about 6-8 weeks to ensure no evidence of arteriovenous fistulous communications intracranially or extra cranially. And also to evaluate for  evidence of recanalization of the occluded right transverse sinus and right sigmoid sinus. Electronically Signed   By: Luanne Bras M.D.   On: 07/10/2017 14:45   Ir Angio External Carotid Sel Ext Carotid Uni R Mod Sed  Result Date: 07/11/2017 CLINICAL DATA:  Progressive headaches. Right temporal hemorrhage. Thrombosis within the right transverse sinus and right sigmoid sinus. EXAM: IR ANGIO EXTERNAL CAROTID SEL EXT CAROTID UNI RIGHT MOD SED COMPARISON:  CT of the head, and MRI of the brain of 07/10/2017. MEDICATIONS: No heparin was given. No antibiotic was administered within 1 hour of the procedure. ANESTHESIA/SEDATION: Versed 1 mg IV; Fentanyl 50 mcg IV. Moderate Sedation Time:  60 minutes. The patient was continuously monitored during the procedure by the interventional radiology nurse under my direct supervision. CONTRAST:  Isovue 300 approximately 80 cc. FLUOROSCOPY TIME:  Fluoroscopy Time: 13 minutes 54 seconds (3213 mGy). COMPLICATIONS: None immediate. TECHNIQUE: Informed written consent was obtained from the patient after a thorough discussion of the procedural risks, benefits and alternatives. All questions were addressed. Maximal Sterile Barrier Technique was utilized including caps, mask, sterile gowns, sterile gloves, sterile drape, hand hygiene and skin antiseptic. A timeout was performed prior to the initiation of the procedure. The right groin was prepped and draped in the usual sterile fashion. Thereafter using modified Seldinger technique, transfemoral access into the right common femoral artery was obtained without difficulty. Over a 0.035 inch guidewire, a 5 French Pinnacle sheath was inserted. Through this, and also over 0.035 inch guidewire, a 5 [place] JB 1 catheter was advanced to the aortic arch region and selectively positioned in the right common carotid artery, the right external carotid artery, the right vertebral artery, the left common carotid artery and the left vertebral  artery. FINDINGS: The right common carotid arteriogram demonstrates the right external carotid artery and its major branches to be widely patent. A selective right external carotid arteriogram demonstrates no abnormal arteriovenous shunting between the extracranial arterial and venous systems, and also the dural venous drainage. The right internal carotid artery at the bulb to the cranial skull base opacifies normally. The petrous, cavernous and supraclinoid segments demonstrate wide patency. The right middle cerebral artery and the right anterior cerebral artery demonstrate wide patency. However, mass effect on the perisylvian triangle being pushed superiorly and somewhat medially secondary to the large right temporal hematoma. No evidence of tumor blush, or of stasis of contrast is noted in this region. The venous phase demonstrates delayed clearance of contrast from the venules. The drainage on the venous side is primarily into the left transverse sinus. The right transverse sinus and the right sigmoid sinus do not opacify. There is a significantly attenuated right internal jugular vein. The right vertebral artery origin is widely patent. The vessel is seen to opacify normally to the cranial skull base. Wide patency is seen of the right posterior-cerebellar artery and the right vertebrorbasilar junction. The opacified portion of the basilar artery, the posterior cerebral arteries, superior cerebellar arteries and  the anterior-inferior cerebellar arteries is noted into the capillary and venous phases. Non-opacified blood is seen in the basilar artery from the contralateral vertebral artery. The venous phase again demonstrates nonvisualization of the right transverse sinus and the right sigmoid sinus. Contrast is noted into the left transverse sinus, the left sigmoid sinus and the internal jugular vein. There appears to be stasis with prolonged clearance of the veins overlying the right cerebellar hemisphere  underlying the tentorium cerebellum and the posterior aspect of the medial right occipital lobe. The left common carotid arteriogram demonstrates the left external carotid artery and its major branches to be widely patent. The left internal carotid artery at the bulb to the cranial skull base opacifies widely. The petrous, cavernous and supraclinoid segments demonstrate wide patency. A left posterior communicating artery is seen opacifying the left posterior cerebral artery distribution. There is approximately 50% stenosis of the mid M1 segment of the left middle cerebral artery. The MCA trifurcation branches and the left anterior cerebral artery, otherwise, opacify into the capillary and venous phases. Again demonstrated is nonvisualization of the right transverse and sigmoid sinuses. The origin of left vertebral artery demonstrates patency though with mild tortuosity. The vessel, otherwise, is seen to opacify normally to the cranial skull base. The left posterior-inferior cerebellar artery and left vertebrobasilar junction demonstrate wide patency. The opacified portion of the basilar artery, the posterior cerebral arteries, the superior cerebellar arteries and the anterior-inferior cerebellar arteries progress into the capillary and venous phases. Again noted is nonvisualization of the right transverse sinus and the right sigmoid sinus. IMPRESSION: Angiographically nonvisualization of the right transverse sinus and sigmoid sinus with a severely attenuated caliber of the right internal jugular vein. This is very suspicious related to dural sinus thrombosis. Slow egress of contrast overlying the right cortical cerebral hemisphere in the venous phase consistent with venous congestion, possibly related to the large temporal hematoma. Suspicion remains of a raised venous pressure overlying the right cerebral hemisphere. No angiographic evidence of extracranial or intracranial arteriovenous shunting at this time. PLAN:  Findings reviewed with the patient's family and referring neurologist. Suggest repeat arteriogram in about 6-8 weeks to ensure no evidence of arteriovenous fistulous communications intracranially or extra cranially. And also to evaluate for evidence of recanalization of the occluded right transverse sinus and right sigmoid sinus. Electronically Signed   By: Luanne Bras M.D.   On: 07/10/2017 14:45   TTE - Very technically difficult study with poor endocardial   definition. Definity contrast was not given. LVEF is grossly   normal at 55-60%, mild LVH, inadequate for wall motion, grade 1   DD with indeterminate LV filling pressure.   PHYSICAL EXAM  Temp:  [98 F (36.7 C)-99 F (37.2 C)] 98.5 F (36.9 C) (02/03 0726) Pulse Rate:  [58-93] 75 (02/03 1100) Resp:  [13-30] 19 (02/03 1100) BP: (91-161)/(45-95) 133/70 (02/03 1100) SpO2:  [88 %-95 %] 93 % (02/03 1100)  General - Well nourished, well developed, in no apparent distress.  Ophthalmologic - fundi not visualized due to noncooperation.  Cardiovascular - Regular rate and rhythm with no murmur.  Mental Status: Alert, oriented, thought content appropriate.  Speech fluent without evidence of aphasia.  Able to follow all commands without difficulty. Cranial Nerves: II: Visual fields with subtle left homonymous inferior quadrantanopsia. PERRL.  III,IV, VI: ptosis not present, EOMI without nystagmus.  V,VII: No facial droop. Temp sensation normal bilaterally VIII: Hearing intact to conversation IX,X: Palate rises symmetrically XI: Symmetric XII: Midline tongue extension Motor:  Right :  Upper extremity   5/5                                      Left:     Upper extremity   5/5             Lower extremity   5/5                                                  Lower extremity   5/5 Normal tone throughout; no atrophy noted Sensory: Temp and light touch intact x 4 Deep Tendon Reflexes: 2+ and symmetric throughout Plantars: Right:  downgoing               Left: downgoing Cerebellar: No ataxia with FNF bilaterally Gait: Deferred  ASSESSMENT/PLAN Mr. HJALMER IOVINO is a 72 y.o. male with history of prostate cancer, HTN, HLD, polycythemia, OSA admitted for right frontal headache and nausea. No tPA given due to [place].    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  Resultant HA much improved.  MRI  Right temporal ICH with likely right transverse sinus thrombosis  MRA  No AVM or aneurysm  MRV  thrombosis of the right transverse sinus, sigmoid sinus and jugular vein  DSA - Occluded RT Transverse and RT sigmoid sinus and poorly opacified RT internal jugular vein.  2D Echo EF 55%  Pan CT  Showed no malignancy but left kidney stone and hydronephrolsis  LDL 103  HgbA1c 5.3  Heparin IV for VTE prophylaxis Fall precautions  Diet Heart Room service appropriate? Yes; Fluid consistency: Thin   No antithrombotic prior to admission, now on lovenox therapeutic dose. Will consider switch to po NOACs tomorrow for preparation of discharge.   Ongoing aggressive stroke risk factor management  Therapy recommendations:  outpt PT  Disposition:  Pending  ICH - right temporal due to venous thrombosis  On lovenox therapeutic dose now  Neuro check  BP goal < 160  If any neuro changes, stop heparin and stat head CT  Cerebral venous thrombosis  Pan CT negative  Hypercoagulable work up so far negative  Unprovoked DVT at this time  According to the guideline, for unprovoked DVT in critical location, and pt in low risk of bleeding, anticoagulation should be life long. Will continue NOAC long term after discharge.  Hypertension Stable BP goal < 160 Off cleviprex Add amlodipine and lisinopril  Long term BP goal  normotensive  Hyperlipidemia  Home meds:  None   LDL 103, goal < 70  Start lipitor 20mg  daily  Continue stain on discharge  Other Stroke Risk Factors  Advanced age  Obesity, Body mass index is 32.87 kg/m.   Obstructive sleep apnea, on CPAP at home  Polycythemia - Hb 17.5->17.5->16.2->16.3->16.2  Other Active Problems  Hx of prostate cancer s/p surgery  Hypokalemia - supplement  Hospital day # 3  This patient is critically ill due to venous thrombosis with ICH, cerebral edema, hypertension and at significant risk of neurological worsening, death form recurrent bleeding, hematoma expansion, thrombosis extension, hypertensive emergency, heart failure. This patient's care requires constant monitoring  of vital signs, hemodynamics, respiratory and cardiac monitoring, review of multiple databases, neurological assessment, discussion with family, other specialists and medical decision making of high complexity. I had long discussion with wife at bedside and later over the phone, updated pt current condition, treatment plan and potential prognosis. They expressed understanding and appreciation. I spent 30 minutes of neurocritical care time in the care of this patient.  Rosalin Hawking, MD PhD Stroke Neurology 07/13/2017 12:28 PM     To contact Stroke Continuity provider, please refer to [email or web address] After hours, contact General Neurology

## 2017-07-13 NOTE — Evaluation (Signed)
Occupational Therapy Evaluation Patient Details Name: Justin Adams MRN: [id number] DOB: 11/05/1945 Today's Date: 07/13/2017    History of Present Illness Mr. ZIV WELCHEL is a 72 y.o. male with history of prostate cancer, HTN, HLD, polycythemia, OSA admitted for right frontal headache and nausea. No tPA given due to [place]. 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    Clinical Impression   PTA Pt independent in mobility and ADL/IADL. Pt is currently min guard to supervision for ADL and min guard for transfers with RW. Pt will require continued acute care OT to further assess subtle left homonymous inferior quadrantanopsia and really dig into executive thinking functioning. No follow up OT at this time, initially 24 hour supervision for safety recommended.     Follow Up Recommendations  No OT follow up;Supervision/Assistance - 24 hour(initially)    Equipment Recommendations  None recommended by OT    Recommendations for Other Services       Precautions / Restrictions Precautions Precautions: Fall Restrictions Weight Bearing Restrictions: No      Mobility Bed Mobility Overal bed mobility: Independent                Transfers Overall transfer level: Needs assistance Equipment used: Rolling walker (2 wheeled) Transfers: Sit to/from Stand Sit to Stand: Min guard         General transfer comment: no assist needed min guard for safety    Balance Overall balance assessment: Needs assistance Sitting-balance support: No upper extremity supported;Feet supported Sitting balance-Leahy Scale: Good Sitting balance - Comments: able to perform LB ADL   Standing balance support: Bilateral upper extremity supported;During functional activity;No upper extremity supported Standing balance-Leahy Scale: Fair Standing balance comment: dynamic movement require BUE - able to static stand without UE support                            ADL either performed or assessed with clinical judgement   ADL Overall ADL's : Needs assistance/impaired                                       General ADL Comments: Pt is able to complete LB dressing (donning/doffing socks) and sink level grooming at min guard/supervision level. Pt was able to feed himself, open containers for lunch and manuever around room with RW for balance avoiding trays and obstacles. Higher level executive thinking skills not evaluated as much as possible this session     Vision Patient Visual Report: Peripheral vision impairment Vision Assessment?: Vision impaired- to be further tested in functional context Additional Comments: subtle left homonymous inferior quadrantanopsia to be evaluated further next session     Perception     Praxis      Pertinent Vitals/Pain Pain Assessment: No/denies pain     Hand Dominance Right   Extremity/Trunk Assessment Upper Extremity Assessment Upper Extremity Assessment: Overall WFL for tasks assessed   Lower Extremity Assessment Lower Extremity Assessment: Defer to PT evaluation   Cervical / Trunk Assessment Cervical / Trunk Assessment: Normal   Communication Communication Communication: No difficulties   Cognition Arousal/Alertness: Awake/alert Behavior During Therapy: WFL for tasks assessed/performed Overall Cognitive Status: Within Functional Limits for tasks assessed  General Comments  Wife, Daughter-in-law, son, present during session. They shared how much they appreciated and enjoyed Dr Erlinda Hong and the SLP [facility].     Exercises     Shoulder Instructions      Home Living Family/patient expects to be discharged to:: Private residence Living Arrangements: Spouse/significant other Available Help at Discharge: Family;Available 24 hours/day Type of Home: House Home Access: Stairs to enter [employer] of Steps: 2 Entrance  Stairs-Rails: None Home Layout: One level     Bathroom Shower/Tub: Occupational psychologist: Handicapped height Bathroom Accessibility: Yes How Accessible: Accessible via walker Home Equipment: Shower seat - built in;Grab bars - tub/shower      Lives With: Spouse    Prior Functioning/Environment Level of Independence: Independent                 OT Problem List: Decreased activity tolerance;Impaired balance (sitting and/or standing);Impaired vision/perception;Decreased knowledge of use of DME or AE;Obesity      OT Treatment/Interventions: Self-care/ADL training;DME and/or AE instruction;Therapeutic activities;Visual/perceptual remediation/compensation;Patient/family education;Balance training    OT Goals(Current goals can be found in the care plan section) Acute Rehab OT Goals Patient Stated Goal: to go home OT Goal Formulation: With patient/family Time For Goal Achievement: 07/27/17 Potential to Achieve Goals: Good ADL Goals Pt Will Perform Grooming: with modified independence;standing Pt Will Transfer to Toilet: with modified independence;regular height toilet;ambulating Pt Will Perform Toileting - Clothing Manipulation and hygiene: with modified independence;sit to/from stand Additional ADL Goal #1: Pt will recall 3 visual compensatory strategies for left homonymous inferior quadrantanopsia  OT Frequency: Min 3X/week   Barriers to D/C:            Co-evaluation              AM-PAC PT "6 Clicks" Daily Activity     Outcome Measure Help from another person eating meals?: None Help from another person taking care of personal grooming?: A Little Help from another person toileting, which includes using toliet, bedpan, or urinal?: A Little Help from another person bathing (including washing, rinsing, drying)?: A Little Help from another person to put on and taking off regular upper body clothing?: None Help from another person to put on and taking off  regular lower body clothing?: None 6 Click Score: 21   End of Session Equipment Utilized During Treatment: Gait belt;Rolling walker Nurse Communication: Mobility status  Activity Tolerance: Patient tolerated treatment well Patient left: in bed;with call bell/phone within reach;with family/visitor present  OT Visit Diagnosis: Unsteadiness on feet (R26.81);Other abnormalities of gait and mobility (R26.89);Low vision, both eyes (H54.2);Other symptoms and signs involving the nervous system (R29.898)                Time: 5885-0277 OT Time Calculation (min): 28 min Charges:  OT General Charges $OT Visit: 1 Visit OT Evaluation $OT Eval Moderate Complexity: 1 Mod OT Treatments $Self Care/Home Management : 8-22 mins G-Codes:     Hulda Humphrey OTR/L [place] 07/13/2017, 3:52 PM

## 2017-07-13 NOTE — Progress Notes (Signed)
Pt wears his home CPAP.

## 2017-07-13 NOTE — Progress Notes (Signed)
Report given to 9P36. Pt to be transferred via wheelchair with nurse. VS WNL.

## 2017-07-14 DIAGNOSIS — G08 Intracranial and intraspinal phlebitis and thrombophlebitis: Secondary | ICD-10-CM

## 2017-07-14 LAB — CBC
HEMATOCRIT: 47.1 % (ref 39.0–52.0)
Hemoglobin: 15.7 g/dL (ref 13.0–17.0)
MCH: 28.8 pg (ref 26.0–34.0)
MCHC: 33.3 g/dL (ref 30.0–36.0)
MCV: 86.3 fL (ref 78.0–100.0)
Platelets: 210 10*3/uL (ref 150–400)
RBC: 5.46 MIL/uL (ref 4.22–5.81)
RDW: 13.9 % (ref 11.5–15.5)
WBC: 7.4 10*3/uL (ref 4.0–10.5)

## 2017-07-14 LAB — BASIC METABOLIC PANEL
ANION GAP: 10 (ref 5–15)
BUN: 16 mg/dL (ref 6–20)
CO2: 25 mmol/L (ref 22–32)
Calcium: 8.4 mg/dL — ABNORMAL LOW (ref 8.9–10.3)
Chloride: 106 mmol/L (ref 101–111)
Creatinine, Ser: 0.87 mg/dL (ref 0.61–1.24)
GLUCOSE: 89 mg/dL (ref 65–99)
POTASSIUM: 3.5 mmol/L (ref 3.5–5.1)
Sodium: 141 mmol/L (ref 135–145)

## 2017-07-14 LAB — PROTHROMBIN GENE MUTATION

## 2017-07-14 MED ORDER — AMLODIPINE BESYLATE 10 MG PO TABS: 10.0000 mg | ORAL_TABLET | Freq: Every day | ORAL | 1 refills | Status: DC

## 2017-07-14 MED ORDER — APIXABAN 5 MG PO TABS: 5.0000 mg | ORAL_TABLET | Freq: Two times a day (BID) | ORAL | Status: DC

## 2017-07-14 MED ORDER — APIXABAN 5 MG PO TABS: 5.0000 mg | ORAL_TABLET | Freq: Two times a day (BID) | ORAL | 1 refills | Status: DC

## 2017-07-14 MED ORDER — LISINOPRIL 20 MG PO TABS: 20.0000 mg | ORAL_TABLET | Freq: Every day | ORAL | 1 refills | Status: AC

## 2017-07-14 MED ORDER — ATORVASTATIN CALCIUM 20 MG PO TABS: 20.0000 mg | ORAL_TABLET | Freq: Every day | ORAL | 1 refills | Status: DC

## 2017-07-14 NOTE — Progress Notes (Signed)
Occupational Therapy Treatment Patient Details Name: Justin Adams MRN: [id number] DOB: Jul 01, 1945 Today's Date: 07/14/2017    History of present illness Justin Adams is a 72 y.o. male with history of prostate cancer, HTN, HLD, polycythemia, OSA admitted for right frontal headache and nausea. No tPA given due to [place]. 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    OT comments  Pt progressing towards goals, completing room and hallway level functional mobility this session with/without AD with overall MinGuard assist. Pt completing seated and standing UB/LB ADLs with minguard throughout. Pt does require intermittent cues for higher level cognitive challenges when recalling multi-step directions. Feel POC remains appropriate at this time. Will continue to follow acutely to maximize [name] safety and independence with ADLs and mobility as well as completion of higher level executive functioning tasks.    Follow Up Recommendations  No OT follow up;Supervision/Assistance - 24 hour(initially )    Equipment Recommendations  None recommended by OT          Precautions / Restrictions Precautions Precautions: Fall Restrictions Weight Bearing Restrictions: No       Mobility Bed Mobility Overal bed mobility: Independent                Transfers Overall transfer level: Needs assistance Equipment used: Rolling walker (2 wheeled);None Transfers: Sit to/from Stand Sit to Stand: Min guard         General transfer comment: no assist needed; min guard for safety    Balance Overall balance assessment: Needs assistance Sitting-balance support: No upper extremity supported;Feet supported Sitting balance-Leahy Scale: Good Sitting balance - Comments: able to perform LB ADL seated EOB    Standing balance support: Bilateral upper extremity supported;During functional activity;No upper extremity supported Standing balance-Leahy Scale:  Fair Standing balance comment: Pt able to static stand without UE support during functional task completion                            ADL either performed or assessed with clinical judgement   ADL Overall ADL's : Needs assistance/impaired     Grooming: Wash/dry hands;Wash/dry face;Oral care;Min guard;Standing               Lower Body Dressing: Min guard;Sit to/from stand Lower Body Dressing Details (indicate cue type and reason): doffing/donning underwear      Toileting- Clothing Manipulation and Hygiene: Min guard;Sit to/from stand Toileting - Clothing Manipulation Details (indicate cue type and reason): Pt standing at toilet to void bladder     Functional mobility during ADLs: Min guard;Rolling walker General ADL Comments: Pt completing room level functional mobility with/without RW and MinGuard throughout; gave Pt 3-step instructions to complete during grooming tasks, requiring verbal/questioning cues to recall 2nd and 3rd task; gave Pt additional 2 step task during further vision assessment with Pt able to recall and complete; Pt completing hallway level functional mobility, reading room numbers to L/R one way and counting backwards by 5's (starting from 50) during mobility back to room, Pt able to complete with increased time for counting      Vision   Additional Comments: Pt with subltle L periphery impairment, though does not appear to impact Pt functionally; Pt locating specific days/meals and reading menu accurately as well as scanning to read room numbers to L/R during hallway level mobility.               Cognition Arousal/Alertness: Awake/alert Behavior During Therapy:  WFL for tasks assessed/performed Overall Cognitive Status: Impaired/Different from baseline Area of Impairment: Memory                     Memory: Decreased short-term memory         General Comments: some decreased STM noted; provided Pt with 3 step directions during  grooming task completion, Pt requiring cues to recall 2nd/3rd tasks after completion of task 1                     General Comments spouse present during session     Pertinent Vitals/ Pain       Pain Assessment: No/denies pain                                                          Frequency  Min 3X/week        Progress Toward Goals  OT Goals(current goals can now be found in the care plan section)  Progress towards OT goals: Progressing toward goals  Acute Rehab OT Goals Patient Stated Goal: to go home OT Goal Formulation: With patient/family Time For Goal Achievement: 07/27/17 Potential to Achieve Goals: Good  Plan Discharge plan remains appropriate                     AM-PAC PT "6 Clicks" Daily Activity     Outcome Measure   Help from another person eating meals?: None Help from another person taking care of personal grooming?: A Little Help from another person toileting, which includes using toliet, bedpan, or urinal?: A Little Help from another person bathing (including washing, rinsing, drying)?: A Little Help from another person to put on and taking off regular upper body clothing?: None Help from another person to put on and taking off regular lower body clothing?: None 6 Click Score: 21    End of Session Equipment Utilized During Treatment: Gait belt;Rolling walker  OT Visit Diagnosis: Unsteadiness on feet (R26.81);Other abnormalities of gait and mobility (R26.89);Low vision, both eyes (H54.2);Other symptoms and signs involving the nervous system (R29.898)   Activity Tolerance Patient tolerated treatment well   Patient Left in bed;with call bell/phone within reach;with family/visitor present;with bed alarm set   Nurse Communication Mobility status        Time: 0998-3382 OT Time Calculation (min): 29 min  Charges: OT General Charges $OT Visit: 1 Visit OT Treatments $Self Care/Home Management : 8-22  mins $Therapeutic Activity: 8-22 mins  Lou Cal, OT Pager [phone] 07/14/2017    Raymondo Band 07/14/2017, 11:55 AM

## 2017-07-14 NOTE — Discharge Instructions (Signed)
Information on my medicine - ELIQUIS® (apixaban) ° °This medication education was reviewed with me or my healthcare representative as part of my discharge preparation.  The pharmacist that spoke with me during my hospital stay was:  °Artia Singley Dien, RPH ° °Why was Eliquis® prescribed for you? °Eliquis® was prescribed to treat blood clots that may have been found in the veins of your legs (deep vein thrombosis) or in your lungs (pulmonary embolism) and to reduce the risk of them occurring again. ° °What do You need to know about Eliquis® ? °The dose is ONE 5 mg tablet taken TWICE daily.  Eliquis® may be taken with or without food.  ° °Try to take the dose about the same time in the morning and in the evening. If you have difficulty swallowing the tablet whole please discuss with your pharmacist how to take the medication safely. ° °Take Eliquis® exactly as prescribed and DO NOT stop taking Eliquis® without talking to the doctor who prescribed the medication.  Stopping may increase your risk of developing a new blood clot.  Refill your prescription before you run out. ° °After discharge, you should have regular check-up appointments with your healthcare provider that is prescribing your Eliquis®. °   °What do you do if you miss a dose? °If a dose of ELIQUIS® is not taken at the scheduled time, take it as soon as possible on the same day and twice-daily administration should be resumed. The dose should not be doubled to make up for a missed dose. ° °Important Safety Information °A possible side effect of Eliquis® is bleeding. You should call your healthcare provider right away if you experience any of the following: °? Bleeding from an injury or your nose that does not stop. °? Unusual colored urine (red or dark brown) or unusual colored stools (red or black). °? Unusual bruising for unknown reasons. °? A serious fall or if you hit your head (even if there is no bleeding). ° °Some medicines may interact with Eliquis®  and might increase your risk of bleeding or clotting while on Eliquis®. To help avoid this, consult your healthcare provider or pharmacist prior to using any new prescription or non-prescription medications, including herbals, vitamins, non-steroidal anti-inflammatory drugs (NSAIDs) and supplements. ° °This website has more information on Eliquis® (apixaban): http://www.eliquis.com/eliquis/home ° °

## 2017-07-14 NOTE — Care Management Note (Signed)
Case Management Note  Patient Details  Name: Justin Adams MRN: [id number] Date of Birth: 12-18-1945  Subjective/Objective:                    Action/Plan: Pt discharging home with self care. CM consulted for outpatient rehab. CM met with the patient and he would like to attend [facility]. Orders in Epic and information on the AVS.  Pt has 24/7 supervision with his spouse at d/c.  Wife is providing transportation home.   CM consulted for Eliquis. Benefits check submitted but not resulted prior to d/c. CM provided him the 30 day free card and asked him to f/u with his pharmacy on the cost after the first 30 days. Pt agreed.   Expected Discharge Date:  07/14/17               Expected Discharge Plan:  OP Rehab  In-House Referral:     Discharge planning Services  CM Consult  Post Acute Care Choice:    Choice offered to:     DME Arranged:    DME Agency:     HH Arranged:    HH Agency:     Status of Service:  Completed, signed off  If discussed at [employer] of Stay Meetings, dates discussed:    Additional Comments:  Pollie Friar, RN 07/14/2017, 11:18 AM

## 2017-07-14 NOTE — Consult Note (Signed)
[facility] CM Primary Care Navigator  07/14/2017  Justin Adams 07-Aug-1945 [id number]   Met with patient and wife Justin Adams) at the bedsideto identify possible discharge needs. Patient reports having severe headache for 2 days not relieved with pain medicine" that had led to this admission. Patient endorsesDr. [name] with [facility] at [facility] as the primary care provider.   [facility] [address] without any problem.   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  He reports that he was driving prior to admissionbut wife will providetransportation to hisdoctors'appointments if needed after discharge.  Patientstates that wife will be the primary caregiverat home.  Discharge plan ishome with Outpatient therapy as recommended by therapist.  Patientand wife voiced understanding to call primary care provider's office when he returns home, for a post discharge follow-up appointment within 1-2 weeks or sooner if needed.Patient letter (with PCP's contact number) was provided asareminder.  Explained to patient and wife about [facility] CM services available for health managementat home but denied any needs or concerns as this point. Patientvoiced understandingto seek referral from primary care provider to [facility] care management ifnecessaryand deemed appropriate for services in the future.  [facility] care management information provided for future needs thathe may have.  Patient hadverbally agreed forEMMI strokecalls tofollowup his recovery at home.   Notedorder for EMMI stroke callsafter discharge, already in place.   For questions, please contact:  Dannielle Huh, BSN, RN- [facility] Primary Care Navigator  Telephone: [phone] [place]

## 2017-07-14 NOTE — Discharge Summary (Signed)
Stroke Discharge Summary  Patient ID: Justin Adams   MRN: [id number]      DOB: [birth year]  Date of Admission: 07/09/2017 Date of Discharge: 07/14/2017  Attending Physician:  Rosalin Hawking, MD, Stroke MD Consultant(s):    Interventional Radiology  Patient's PCP:  [name], MD  DISCHARGE DIAGNOSIS:  Active Problems:   Right temporal Intracerebral hemorrhage due to lateral cerebral venous sinus thrombosis   ICH (intracerebral hemorrhage) (HCC)   Cytotoxic cerebral  edema Hypertension Hyperlipidemia Hypokalemia Polycythemia OSA  Past Medical History:  Diagnosis Date  . Adenomatous polyp   . Cancer [facility]) 2003   prostate  . Hemorrhoids   . Hypercholesteremia   . Hypertension   . Muscular degeneration   . Nephrolithiasis   . Obesity   . Polycythemia   . Shingles   . Sleep apnea    Past Surgical History:  Procedure Laterality Date  . CHOLECYSTECTOMY    . IR ANGIO EXTERNAL CAROTID SEL EXT CAROTID UNI R MOD SED  07/10/2017  . IR ANGIO INTRA EXTRACRAN SEL INTERNAL CAROTID BILAT MOD SED  07/10/2017  . IR ANGIO VERTEBRAL SEL VERTEBRAL BILAT MOD SED  07/10/2017  . LEFT HEART CATHETERIZATION WITH CORONARY ANGIOGRAM N/A 03/15/2013   Procedure: LEFT HEART CATHETERIZATION WITH CORONARY ANGIOGRAM;  Surgeon: Minus Breeding, MD;  Location: [facility] CATH LAB;  Service: Cardiovascular;  Laterality: N/A;  . PROSTATECTOMY    . STONE EXTRACTION WITH BASKET      Allergies as of 07/14/2017      Reactions   Iodinated Diagnostic Agents Rash      Medication List    STOP taking these medications   acetaminophen 500 MG tablet Commonly known as:  TYLENOL   ibuprofen 200 MG tablet Commonly known as:  ADVIL,MOTRIN     TAKE these medications   amLODipine 10 MG tablet Commonly known as:  NORVASC Take 1 tablet (10 mg total) by mouth daily.   apixaban 5 MG Tabs tablet Commonly known as:  ELIQUIS Take 1 tablet (5 mg total) by mouth 2 (two) times daily.   atorvastatin 20 MG  tablet Commonly known as:  LIPITOR Take 1 tablet (20 mg total) by mouth daily at 6 PM.   CYTRA-2 PO Take 60 mLs by mouth 2 (two) times daily.   ICAPS PO Take 1 capsule by mouth 2 (two) times daily.   lisinopril 20 MG tablet Commonly known as:  PRINIVIL,ZESTRIL Take 1 tablet (20 mg total) by mouth daily.   NYQUIL PO Take 10-15 mLs by mouth every 6 (six) hours as needed (for symptoms).      LABORATORY STUDIES CBC    Component Value Date/Time   WBC 7.4 07/14/2017 0357   RBC 5.46 07/14/2017 0357   HGB 15.7 07/14/2017 0357   HCT 47.1 07/14/2017 0357   PLT 210 07/14/2017 0357   MCV 86.3 07/14/2017 0357   MCH 28.8 07/14/2017 0357   MCHC 33.3 07/14/2017 0357   RDW 13.9 07/14/2017 0357   LYMPHSABS 1.0 07/09/2017 2049   MONOABS 0.4 07/09/2017 2049   EOSABS 0.0 07/09/2017 2049   BASOSABS 0.0 07/09/2017 2049   CMP    Component Value Date/Time   NA 141 07/14/2017 0357   K 3.5 07/14/2017 0357   CL 106 07/14/2017 0357   CO2 25 07/14/2017 0357   GLUCOSE 89 07/14/2017 0357   BUN 16 07/14/2017 0357   CREATININE 0.87 07/14/2017 0357   CALCIUM 8.4 (L) 07/14/2017 0357   GFRNONAA >60 07/14/2017  [place] >60 07/14/2017 0357   COAGS Lab Results  Component Value Date   INR 1.07 07/09/2017   INR 1.09 03/15/2013   Lipid Panel    Component Value Date/Time   CHOL 159 07/11/2017 0303   TRIG 78 07/11/2017 0303   HDL 36 (L) 07/11/2017 0303   CHOLHDL 4.4 07/11/2017 0303   VLDL 16 07/11/2017 0303   LDLCALC 107 (H) 07/11/2017 0303   HgbA1C  Lab Results  Component Value Date   HGBA1C 5.3 07/11/2017   Urinalysis    Component Value Date/Time   LABSPEC >=1.030 02/13/2009 2042   PHURINE 5.5 02/13/2009 2042   GLUCOSEU NEGATIVE 02/13/2009 2042   HGBUR TRACE (A) 02/13/2009 2042   BILIRUBINUR SMALL (A) 02/13/2009 2042   KETONESUR NEGATIVE 02/13/2009 2042   PROTEINUR NEGATIVE 02/13/2009 2042   UROBILINOGEN 1.0 02/13/2009 2042   NITRITE NEGATIVE 02/13/2009 2042    LEUKOCYTESUR  02/13/2009 2042    NEGATIVE Biochemical Testing Only. Please order routine urinalysis from main lab if confirmatory testing is needed.   Urine Drug Screen No results found for: LABOPIA, COCAINSCRNUR, LABBENZ, AMPHETMU, THCU, LABBARB  Alcohol Level No results found for: [facility]   SIGNIFICANT DIAGNOSTIC STUDIES Ct Head Wo Contrast  Result Date: 07/12/2017 CLINICAL DATA:  Continued surveillance venous thrombosis. EXAM: CT HEAD WITHOUT CONTRAST TECHNIQUE: Contiguous axial images were obtained from the base of the skull through the vertex without intravenous contrast. COMPARISON:  Multiple priors. FINDINGS: Brain: Unchanged RIGHT temporal lobe intraparenchymal hemorrhage/venous infarct, mild surrounding edema. Minor mass effect, but no significant midline shift. Cross-section of 31 x 56 mm on image 14 indicates interval stability compared with 07/10/2017. Trace layering blood in the ventricles, but no hydrocephalus. Mild atrophy and chronic microvascular ischemic change, stable. Vascular: Calcification of the cavernous internal carotid arteries consistent with cerebrovascular atherosclerotic disease. No signs of intracranial large vessel occlusion. Skull: Calvarium intact. Sinuses/Orbits: Slight fluid in the RIGHT maxillary and BILATERAL ethmoid air cells, but no significant opacity. Orbits are unremarkable. Other: No mastoid or middle ear fluid. IMPRESSION: Stable RIGHT temporal lobe ICH. Mild surrounding edema. No new findings. Electronically Signed   By: Staci Righter M.D.   On: 07/12/2017 12:50   Ct Head Wo Contrast  Result Date: 07/10/2017 CLINICAL DATA:  72 year old male with right temporal lobe intra-axial hemorrhage diagnosed yesterday after presentation with persistent headache. EXAM: CT HEAD WITHOUT CONTRAST TECHNIQUE: Contiguous axial images were obtained from the base of the skull through the vertex without intravenous contrast. COMPARISON:  Head CT without contrast 0152 hr today.  Brain MRI 07/09/2017. FINDINGS: Brain: Mostly hyperdense intra-axial hemorrhage within the right temporal lobe centrally and involving the inferior middle and superior temporal gyri. Hemorrhage encompasses 62 by 31 by 38 millimeters (AP by transverse by CC) for an estimated blood volume of 37 milliliters -unchanged from the 07/09/2017 MRI. Surrounding edema and regional mass effect remain stable. No intraventricular extension. The small 2-3 millimeter superimposed right subdural hematoma more apparent by MRI is stable. Stable trace leftward midline shift. Basilar cisterns remain stable. Stable ventricle size and configuration. Stable gray-white matter differentiation throughout the brain. No new cortically based infarct identified. Vascular: Asymmetric hyperdensity of the right transverse and sigmoid sinuses. Corresponding to loss of enhancement and filling defect on MRI yesterday. Calcified atherosclerosis at the skull base. Skull: Stable and negative. Sinuses/Orbits: Stable. Widespread ethmoid mucosal thickening. Bilateral maxillary sinus bubbly opacity. Tympanic cavities and mastoids remain clear. Other: Stable orbit and scalp soft tissues. IMPRESSION: 1. Right transverse and  sigmoid sinus thrombosis with acute right temporal lobe intra-axial hemorrhage and small/trace right subdural hematoma. 2. Stable parenchymal hemorrhage volume since the MRI 07/09/2017, estimated at 37 mL. Stable surrounding edema and mild regional mass effect. 3. Stable trace right lateral subdural hematoma. No intraventricular hemorrhage extension or ventriculomegaly. No increasing intracranial mass effect. Stable trace leftward midline shift. 4. No new No acute intracranial abnormality. Electronically Signed   By: Genevie Ann M.D.   On: 07/10/2017 08:39   Ct Head Wo Contrast  Result Date: 07/10/2017 CLINICAL DATA:  72 y/o M; 2 weeks of headache and 2 days of nausea, suspect intracranial hemorrhage. EXAM: CT HEAD WITHOUT CONTRAST  TECHNIQUE: Contiguous axial images were obtained from the base of the skull through the vertex without intravenous contrast. COMPARISON:  07/09/2017 MRI head FINDINGS: Brain: Right temporal lobe hematoma spanning 6.0 x 2.7 x 2.7 cm (volume = 23 cm^3)(AP x ML x CC series 5, image 41 and series 6, image 17). Surrounding edema and result in mass effect results in minimal right-sided uncal herniation, partial effacement of the right lateral ventricle, and 3 mm of right-to-left midline shift. There is a small volume of subarachnoid hemorrhage overlying the right temporal lobe and trace volume of hemorrhage within occipital horns of lateral ventricles likely due to redistribution. Findings are stable from prior MRI of the brain given differences in technique. No additional acute intracranial hemorrhage, large territory infarct, or focal mass effect is identified. Background of mild chronic microvascular ischemic changes and parenchymal volume loss of the brain. Vascular: No hyperdense vessel or unexpected calcification. Skull: Normal. Negative for fracture or focal lesion. Sinuses/Orbits: Moderate diffuse paranasal sinus mucosal thickening with aerosolized secretions in the maxillary sinuses. Normal aeration of mastoid air cells. Bilateral intra-ocular lens replacement. Other: None. IMPRESSION: 1. Stable mixed attenuation hematoma in the right temporal lobe measuring up to 6 cm, 23 cc. Stable mass effect with 3 mm right-to-left midline shift. Stable small volume subarachnoid hemorrhage over right convexity and trace intraventricular hemorrhage likely due to redistribution. 2. No new acute intracranial abnormality identified. These results were called by telephone at the time of interpretation on 07/10/2017 at 2:14 am to Dr. Kerney Elbe , who verbally acknowledged these results. Electronically Signed   By: Kristine Garbe M.D.   On: 07/10/2017 02:13   Ct Chest W Contrast  Result Date: 07/11/2017 CLINICAL  DATA:  Prostate cancer. Thrombosis of the right transverse sinus. Evaluate for occult malignancy. EXAM: CT CHEST, ABDOMEN, AND PELVIS WITH CONTRAST TECHNIQUE: Multidetector CT imaging of the chest, abdomen and pelvis was performed following the standard protocol during bolus administration of intravenous contrast. CONTRAST:  185mL ISOVUE-300 IOPAMIDOL (ISOVUE-300) INJECTION 61% COMPARISON:  Abdomen and pelvis CT 06/14/2009 FINDINGS: CT CHEST FINDINGS Cardiovascular: The heart size is normal. No pericardial effusion. Coronary artery calcification is evident. Mediastinum/Nodes: No mediastinal lymphadenopathy. There is no hilar lymphadenopathy. The esophagus has normal imaging features. There is no axillary lymphadenopathy. Lungs/Pleura: Areas of atelectasis noted in the lower lobes bilaterally and in the lingula. No pulmonary edema or pleural effusion. No suspicious pulmonary nodule or mass given limitations of the atelectasis in the posterior lower lobes. Musculoskeletal: Bone windows reveal no worrisome lytic or sclerotic osseous lesions. CT ABDOMEN PELVIS FINDINGS Hepatobiliary: No focal abnormality within the liver parenchyma. Gallbladder surgically absent. No intrahepatic or extrahepatic biliary dilation. Pancreas: No focal mass lesion. No dilatation of the main duct. No intraparenchymal cyst. No peripancreatic edema. Spleen: No splenomegaly. No focal mass lesion. Adrenals/Urinary Tract: No adrenal nodule  or mass. Right kidney unremarkable. No right hydroureteronephrosis. There atrophy of the left kidney with left hydronephrosis, new in the long interval since prior study. 13 mm nonobstructing stone identified in the left renal pelvis with 9 mm nonobstructing stone identified in the lower pole. Left ureter is dilated down to its mid segment where there is abrupt transition to nondilated ureter. No obstructing lesion is evident at the transition point. The urinary bladder appears normal for the degree of  distention. Stomach/Bowel: Stomach is nondistended. No gastric wall thickening. No evidence of outlet obstruction. Duodenum is normally positioned as is the ligament of Treitz. Duodenal diverticulum noted. No small bowel wall thickening. No small bowel dilatation. The terminal ileum is normal. The appendix is not visualized, but there is no edema or inflammation in the region of the cecum. No gross colonic mass. No colonic wall thickening. No substantial diverticular change. Vascular/Lymphatic: There is abdominal aortic atherosclerosis without aneurysm. There is no gastrohepatic or hepatoduodenal ligament lymphadenopathy. No intraperitoneal or retroperitoneal lymphadenopathy. No pelvic sidewall lymphadenopathy. Reproductive: Prostate gland surgically absent. Other: No intraperitoneal free fluid. Musculoskeletal: There minimal edema in the right groin compatible with recent vascular access. IMPRESSION: 1. No evidence for neoplasm in the chest, abdomen, or pelvis. 2. Left hydroureteronephrosis with left ureter dilated to the mid segment where there is abrupt transition to nondilated ureter. No obstructing stone or soft tissue lesion evident by CT. Overlying left renal atrophy suggests chronic process. 2 stones are seen in the left kidney. 3. Status post prostatectomy. No lymphadenopathy in the abdomen or pelvis. 4. Subsegmental atelectasis in both lower lobes and lingula. Electronically Signed   By: Misty Stanley M.D.   On: 07/11/2017 14:21   Mr Jodene Nam Head Wo Contrast  Result Date: 07/10/2017 CLINICAL DATA:  Right temporal lobe intraparenchymal hemorrhage with suspicion of venous thrombosis. EXAM: MRA head without CONTRAST MRV HEAD WITHOUT CONTRAST TECHNIQUE: Multiplanar, multiecho pulse sequences of the brain and surrounding structures were obtained with intravenous contrast. Angiographic images of the intracranial venous structures were obtained using MRV technique without intravenous contrast. COMPARISON:  MRI  07/09/2017.  CT studies earlier same day. FINDINGS: MRA HEAD FINDINGS Some motion degradation. Both internal carotid arteries are widely patent through the skull base and siphon regions. Left anterior and middle cerebral vessels are normal without proximal stenosis, aneurysm or vascular malformation. On the right, the anterior cerebral vessels appear normal. The right M1 segment is patent. There is distortion of the right middle cerebral artery branches secondary to the intraparenchymal hematoma. I do not suspect a primary arterial abnormality. No sign of aneurysm or high flow vascular malformation. Posterior circulation vessels appear normal. Both vertebral arteries widely patent to the basilar. No basilar stenosis. Posterior circulation branch vessels appear normal, with some motion degradation. MRV HEAD FINDINGS Superior sagittal sinus is patent. Deep veins are patent. Left transverse sinus, sigmoid sinus and jugular vein are patent. There thrombosis of the right transverse sinus, sigmoid sinus and jugular vein. IMPRESSION: MR venography confirmed thrombosis of the right transverse sinus, sigmoid sinus and jugular vein. Superior sagittal sinus, deep venous system and left sided drainage are patent. MR angiography does not show any primary arterial side pathology. There is diminished visualization of the right MCA branches due to the regional hematoma. No sign of aneurysm or high flow vascular abnormality. Electronically Signed   By: Nelson Chimes M.D.   On: 07/10/2017 11:04   Mr Jeri Cos GQ Contrast  Result Date: 07/09/2017 CLINICAL DATA:  Initial evaluation for persistent  headaches for past 2 weeks EXAM: MRI HEAD WITHOUT AND WITH CONTRAST TECHNIQUE: Multiplanar, multiecho pulse sequences of the brain and surrounding structures were obtained without and with intravenous contrast. CONTRAST:  20 cc of MultiHance. COMPARISON:  None available. FINDINGS: Brain: Study moderately degraded by motion artifact.  Generalized age-related cerebral atrophy. Patchy T2/FLAIR hyperintensity within the periventricular and deep white matter both cerebral hemispheres, most consistent with chronic small vessel ischemic disease, mild in nature. There is an acute to subacute appearing intraparenchymal hematoma centered at the peripheral right temporal lobe measuring approximately 6.1 x 3.1 x 3.8 cm (series 8, image 11). Surrounding rim of vasogenic edema with mild regional mass effect. Associated small volume subarachnoid hemorrhage within the adjacent right frontotemporal region, evident on SWI sequence (series 10, image 52). Additionally, evidence for extra-axial extension with a in 3 mm right holo hemispheric subdural hematoma. Trace layering blood products within the occipital horns of both lateral ventricles, suspected to be related to redistribution (series 10, image 33). Mild mass effect on the right lateral ventricle which is partially attenuated. Trace 3 mm right-to-left shift. No hydrocephalus or ventricular trapping. Basilar cisterns are patent. Scattered leptomeningeal enhancement within the right frontotemporal region likely reactive. No discernible mass lesion seen underlying the intraparenchymal hematoma. Abnormal flow void within the right transverse and sigmoid sinus, suspicious for possible venous sinus thrombosis (series 7, image 8). No evidence for acute infarct. Gray-white matter differentiation otherwise maintained. No other encephalomalacia to suggest chronic infarction. No other mass lesion. Pituitary gland within normal limits. Midline structures intact and normal. Vascular: Major arterial vascular flow voids maintained at the skull base. Skull and upper cervical spine: Craniocervical junction within normal limits. Upper cervical spine normal. Bone marrow signal intensity within normal limits. No scalp soft tissue abnormality. Sinuses/Orbits: Globes and orbital soft tissues within normal limits. Patient status  post lens extraction bilaterally. Scattered mucosal thickening within the maxillary sinuses and ethmoidal air cells. Trace bilateral mastoid effusions noted. Other: None. IMPRESSION: 1. 6.1 x 3.1 x 3.8 cm acute to subacute intraparenchymal hematoma positioned within the anterior right temporal lobe. Associated edema with regional mass effect with trace 3 mm right-to-left midline shift. 2. Associated small volume subarachnoid hemorrhage within the adjacent right frontotemporal region. Trace intraventricular blood products suspected to be related to redistribution. 3. Evidence for intra-axial extension with small 3 mm right holo hemispheric subdural hematoma. 4. Abnormal flow void within the right transverse and sigmoid sinuses, suspicious for venous sinus thrombosis. Findings suspected to be the underlying etiology for the intraparenchymal hemorrhage. 5. Mild chronic small vessel ischemic disease. Critical Value/emergent results were called by telephone at the time of interpretation on 07/09/2017 at 9:19 pm to Dr. Tomi Bamberger , who verbally acknowledged these results. Electronically Signed   By: Jeannine Boga M.D.   On: 07/09/2017 21:20   Ct Abdomen Pelvis W Contrast  Result Date: 07/11/2017 CLINICAL DATA:  Prostate cancer. Thrombosis of the right transverse sinus. Evaluate for occult malignancy. EXAM: CT CHEST, ABDOMEN, AND PELVIS WITH CONTRAST TECHNIQUE: Multidetector CT imaging of the chest, abdomen and pelvis was performed following the standard protocol during bolus administration of intravenous contrast. CONTRAST:  176mL ISOVUE-300 IOPAMIDOL (ISOVUE-300) INJECTION 61% COMPARISON:  Abdomen and pelvis CT 06/14/2009 FINDINGS: CT CHEST FINDINGS Cardiovascular: The heart size is normal. No pericardial effusion. Coronary artery calcification is evident. Mediastinum/Nodes: No mediastinal lymphadenopathy. There is no hilar lymphadenopathy. The esophagus has normal imaging features. There is no axillary  lymphadenopathy. Lungs/Pleura: Areas of atelectasis noted in the lower lobes  bilaterally and in the lingula. No pulmonary edema or pleural effusion. No suspicious pulmonary nodule or mass given limitations of the atelectasis in the posterior lower lobes. Musculoskeletal: Bone windows reveal no worrisome lytic or sclerotic osseous lesions. CT ABDOMEN PELVIS FINDINGS Hepatobiliary: No focal abnormality within the liver parenchyma. Gallbladder surgically absent. No intrahepatic or extrahepatic biliary dilation. Pancreas: No focal mass lesion. No dilatation of the main duct. No intraparenchymal cyst. No peripancreatic edema. Spleen: No splenomegaly. No focal mass lesion. Adrenals/Urinary Tract: No adrenal nodule or mass. Right kidney unremarkable. No right hydroureteronephrosis. There atrophy of the left kidney with left hydronephrosis, new in the long interval since prior study. 13 mm nonobstructing stone identified in the left renal pelvis with 9 mm nonobstructing stone identified in the lower pole. Left ureter is dilated down to its mid segment where there is abrupt transition to nondilated ureter. No obstructing lesion is evident at the transition point. The urinary bladder appears normal for the degree of distention. Stomach/Bowel: Stomach is nondistended. No gastric wall thickening. No evidence of outlet obstruction. Duodenum is normally positioned as is the ligament of Treitz. Duodenal diverticulum noted. No small bowel wall thickening. No small bowel dilatation. The terminal ileum is normal. The appendix is not visualized, but there is no edema or inflammation in the region of the cecum. No gross colonic mass. No colonic wall thickening. No substantial diverticular change. Vascular/Lymphatic: There is abdominal aortic atherosclerosis without aneurysm. There is no gastrohepatic or hepatoduodenal ligament lymphadenopathy. No intraperitoneal or retroperitoneal lymphadenopathy. No pelvic sidewall lymphadenopathy.  Reproductive: Prostate gland surgically absent. Other: No intraperitoneal free fluid. Musculoskeletal: There minimal edema in the right groin compatible with recent vascular access. IMPRESSION: 1. No evidence for neoplasm in the chest, abdomen, or pelvis. 2. Left hydroureteronephrosis with left ureter dilated to the mid segment where there is abrupt transition to nondilated ureter. No obstructing stone or soft tissue lesion evident by CT. Overlying left renal atrophy suggests chronic process. 2 stones are seen in the left kidney. 3. Status post prostatectomy. No lymphadenopathy in the abdomen or pelvis. 4. Subsegmental atelectasis in both lower lobes and lingula. Electronically Signed   By: Misty Stanley M.D.   On: 07/11/2017 14:21   Mr Mrv Head Wo Cm  Result Date: 07/10/2017 CLINICAL DATA:  Right temporal lobe intraparenchymal hemorrhage with suspicion of venous thrombosis. EXAM: MRA head without CONTRAST MRV HEAD WITHOUT CONTRAST TECHNIQUE: Multiplanar, multiecho pulse sequences of the brain and surrounding structures were obtained with intravenous contrast. Angiographic images of the intracranial venous structures were obtained using MRV technique without intravenous contrast. COMPARISON:  MRI 07/09/2017.  CT studies earlier same day. FINDINGS: MRA HEAD FINDINGS Some motion degradation. Both internal carotid arteries are widely patent through the skull base and siphon regions. Left anterior and middle cerebral vessels are normal without proximal stenosis, aneurysm or vascular malformation. On the right, the anterior cerebral vessels appear normal. The right M1 segment is patent. There is distortion of the right middle cerebral artery branches secondary to the intraparenchymal hematoma. I do not suspect a primary arterial abnormality. No sign of aneurysm or high flow vascular malformation. Posterior circulation vessels appear normal. Both vertebral arteries widely patent to the basilar. No basilar stenosis.  Posterior circulation branch vessels appear normal, with some motion degradation. MRV HEAD FINDINGS Superior sagittal sinus is patent. Deep veins are patent. Left transverse sinus, sigmoid sinus and jugular vein are patent. There thrombosis of the right transverse sinus, sigmoid sinus and jugular vein. IMPRESSION: MR venography confirmed  thrombosis of the right transverse sinus, sigmoid sinus and jugular vein. Superior sagittal sinus, deep venous system and left sided drainage are patent. MR angiography does not show any primary arterial side pathology. There is diminished visualization of the right MCA branches due to the regional hematoma. No sign of aneurysm or high flow vascular abnormality. Electronically Signed   By: Nelson Chimes M.D.   On: 07/10/2017 11:04   Ir Angio Intra Extracran Sel Internal Carotid Bilat Mod Sed  Result Date: 07/11/2017 CLINICAL DATA:  Worsening headaches. CT scan revealing right temporal hematoma. Thrombus in the right transverse sinus and right sigmoid sinus EXAM: IR ANGIO INTRA EXTRACRAN SEL INTERNAL CAROTID BILAT MOD SED MEDICATIONS: Heparin 1000 units IV; Marland Kitchen The antibiotic was administered within 1 hour of the procedure ANESTHESIA/SEDATION: Versed  mg IV; Fentanyl  mcg IV Moderate Sedation Time:   minutes The patient was continuously monitored during the procedure by the interventional radiology nurse under my direct supervision. FLUOROSCOPY TIME:  Fluoroscopy Time:  minutes  seconds ( mGy). COMPLICATIONS: None immediate. TECHNIQUE: Informed written consent was obtained from the patient after a thorough discussion of the procedural risks, benefits and alternatives. All questions were addressed. Maximal Sterile Barrier Technique was utilized including caps, mask, sterile gowns, sterile gloves, sterile drape, hand hygiene and skin antiseptic. A timeout was performed prior to the initiation of the procedure. The right groin was prepped and draped in the usual sterile fashion.  Thereafter using modified Seldinger technique, transfemoral access into the right common femoral artery was obtained without difficulty. Over a 0.035 inch guidewire, a 5 French Pinnacle sheath was inserted. Through this, and also over 0.035 inch guidewire, a 5 [place] JB 1 catheter was advanced to the aortic arch region and selectively positioned in the right common carotid artery, the right external carotid artery, the right vertebral artery, the left common carotid artery and the left vertebral artery. FINDINGS: Right CCA: The right common carotid arteriogram demonstrates the right external carotid artery and its major branches to be widely patent. Right ICA: The right internal carotid artery at the bulb to the cranial skull base opacifies normally. Right ECA: The selective right external carotid artery injection demonstrates normal opacification of the right external carotid artery branches. Right Intracranial: The right middle cerebral artery and the right anterior cerebral artery opacify normally into the capillary and venous phases. Transient cross filling via the anterior communicating artery of the left anterior cerebral artery A2 segment is noted. Right Sided Anterior Venous: The petrous, cavernous and supraclinoid segments are widely patent. The venous phase demonstrates preferential egress of contrast into the right transverse sinus, the sigmoid sinus and the right internal jugular vein. There is no early venous shunting into the venous structures at the skull base or intracranially into the dural sinuses. Right Vertebral Extracranial: The origin of the right vertebral artery is normal. The vessel is seen to opacify normally to the cranial skull base. Normal opacification is seen of the right vertebrobasilar junction and the right posterior-inferior cerebellar artery. Right Vertebral Intracranial: The opacified portion of the basilar artery, the right posterior cerebral artery, the superior cerebellar  arteries and the anterior-inferior cerebellar arteries is normal into the capillary and venous phases. Non-opacified blood is seen in the basilar artery from the contralateral vertebral artery. Right Sided Posterior Venous: The right transverse sinus and the sigmoid sinus are normal and widely patent. ________________________________________________________ Left CCA: The left common carotid arteriogram demonstrates the left external carotid artery and its major branches to be  widely patent. Left ICA: The left internal carotid artery at the bulb to the cranial skull base opacifies normally. Left ECA: The selective left external carotid artery injection demonstrates normal opacification of the left external carotid artery branches. Left Intracranial: The left middle cerebral artery and the left anterior cerebral artery opacify normally into the capillary and venous phases. Transient cross filling via the anterior communicating artery of the right anterior cerebral artery A2 segment is noted. Left Sided Anterior Venous: The petrous, cavernous and supraclinoid segments are widely patent. The venous phase demonstrates preferential egress of contrast into the left transverse sinus, the sigmoid sinus and the left internal jugular vein. There is no early venous shunting into the venous structures at the skull base or intracranially into the dural sinuses. Left Vertebral Extracranial: The origin of the left vertebral artery is normal. The vessel is seen to opacify normally to the cranial skull base. Normal opacification is seen of the left vertebrobasilar junction and the lfet posterior-inferior cerebellar artery. Left Vertebral Intracranial: The opacified portion of the basilar artery, the left posterior cerebral artery, the superior cerebellar arteries and the anterior-inferior cerebellar arteries is normal into the capillary and venous phases. Non-opacified blood is seen in the basilar artery from the contralateral  vertebral artery. Left Sided Posterior Venous: The left transverse sinus and the sigmoid sinus are normal and widely patent. Electronically Signed   By: Luanne Bras M.D.   On: 07/11/2017 09:12   Ir Angio Vertebral Sel Vertebral Bilat Mod Sed  Result Date: 07/11/2017 CLINICAL DATA:  Progressive headaches. Right temporal hemorrhage. Thrombosis within the right transverse sinus and right sigmoid sinus. EXAM: IR ANGIO EXTERNAL CAROTID SEL EXT CAROTID UNI RIGHT MOD SED COMPARISON:  CT of the head, and MRI of the brain of 07/10/2017. MEDICATIONS: No heparin was given. No antibiotic was administered within 1 hour of the procedure. ANESTHESIA/SEDATION: Versed 1 mg IV; Fentanyl 50 mcg IV. Moderate Sedation Time:  60 minutes. The patient was continuously monitored during the procedure by the interventional radiology nurse under my direct supervision. CONTRAST:  Isovue 300 approximately 80 cc. FLUOROSCOPY TIME:  Fluoroscopy Time: 13 minutes 54 seconds (3213 mGy). COMPLICATIONS: None immediate. TECHNIQUE: Informed written consent was obtained from the patient after a thorough discussion of the procedural risks, benefits and alternatives. All questions were addressed. Maximal Sterile Barrier Technique was utilized including caps, mask, sterile gowns, sterile gloves, sterile drape, hand hygiene and skin antiseptic. A timeout was performed prior to the initiation of the procedure. The right groin was prepped and draped in the usual sterile fashion. Thereafter using modified Seldinger technique, transfemoral access into the right common femoral artery was obtained without difficulty. Over a 0.035 inch guidewire, a 5 French Pinnacle sheath was inserted. Through this, and also over 0.035 inch guidewire, a 5 [place] JB 1 catheter was advanced to the aortic arch region and selectively positioned in the right common carotid artery, the right external carotid artery, the right vertebral artery, the left common carotid artery  and the left vertebral artery. FINDINGS: The right common carotid arteriogram demonstrates the right external carotid artery and its major branches to be widely patent. A selective right external carotid arteriogram demonstrates no abnormal arteriovenous shunting between the extracranial arterial and venous systems, and also the dural venous drainage. The right internal carotid artery at the bulb to the cranial skull base opacifies normally. The petrous, cavernous and supraclinoid segments demonstrate wide patency. The right middle cerebral artery and the right anterior cerebral artery demonstrate wide  patency. However, mass effect on the perisylvian triangle being pushed superiorly and somewhat medially secondary to the large right temporal hematoma. No evidence of tumor blush, or of stasis of contrast is noted in this region. The venous phase demonstrates delayed clearance of contrast from the venules. The drainage on the venous side is primarily into the left transverse sinus. The right transverse sinus and the right sigmoid sinus do not opacify. There is a significantly attenuated right internal jugular vein. The right vertebral artery origin is widely patent. The vessel is seen to opacify normally to the cranial skull base. Wide patency is seen of the right posterior-cerebellar artery and the right vertebrorbasilar junction. The opacified portion of the basilar artery, the posterior cerebral arteries, superior cerebellar arteries and the anterior-inferior cerebellar arteries is noted into the capillary and venous phases. Non-opacified blood is seen in the basilar artery from the contralateral vertebral artery. The venous phase again demonstrates nonvisualization of the right transverse sinus and the right sigmoid sinus. Contrast is noted into the left transverse sinus, the left sigmoid sinus and the internal jugular vein. There appears to be stasis with prolonged clearance of the veins overlying the right  cerebellar hemisphere underlying the tentorium cerebellum and the posterior aspect of the medial right occipital lobe. The left common carotid arteriogram demonstrates the left external carotid artery and its major branches to be widely patent. The left internal carotid artery at the bulb to the cranial skull base opacifies widely. The petrous, cavernous and supraclinoid segments demonstrate wide patency. A left posterior communicating artery is seen opacifying the left posterior cerebral artery distribution. There is approximately 50% stenosis of the mid M1 segment of the left middle cerebral artery. The MCA trifurcation branches and the left anterior cerebral artery, otherwise, opacify into the capillary and venous phases. Again demonstrated is nonvisualization of the right transverse and sigmoid sinuses. The origin of left vertebral artery demonstrates patency though with mild tortuosity. The vessel, otherwise, is seen to opacify normally to the cranial skull base. The left posterior-inferior cerebellar artery and left vertebrobasilar junction demonstrate wide patency. The opacified portion of the basilar artery, the posterior cerebral arteries, the superior cerebellar arteries and the anterior-inferior cerebellar arteries progress into the capillary and venous phases. Again noted is nonvisualization of the right transverse sinus and the right sigmoid sinus. IMPRESSION: Angiographically nonvisualization of the right transverse sinus and sigmoid sinus with a severely attenuated caliber of the right internal jugular vein. This is very suspicious related to dural sinus thrombosis. Slow egress of contrast overlying the right cortical cerebral hemisphere in the venous phase consistent with venous congestion, possibly related to the large temporal hematoma. Suspicion remains of a raised venous pressure overlying the right cerebral hemisphere. No angiographic evidence of extracranial or intracranial arteriovenous  shunting at this time. PLAN: Findings reviewed with the patient's family and referring neurologist. Suggest repeat arteriogram in about 6-8 weeks to ensure no evidence of arteriovenous fistulous communications intracranially or extra cranially. And also to evaluate for evidence of recanalization of the occluded right transverse sinus and right sigmoid sinus. Electronically Signed   By: Luanne Bras M.D.   On: 07/10/2017 14:45   Ir Angio External Carotid Sel Ext Carotid Uni R Mod Sed  Result Date: 07/11/2017 CLINICAL DATA:  Progressive headaches. Right temporal hemorrhage. Thrombosis within the right transverse sinus and right sigmoid sinus. EXAM: IR ANGIO EXTERNAL CAROTID SEL EXT CAROTID UNI RIGHT MOD SED COMPARISON:  CT of the head, and MRI of the brain of  07/10/2017. MEDICATIONS: No heparin was given. No antibiotic was administered within 1 hour of the procedure. ANESTHESIA/SEDATION: Versed 1 mg IV; Fentanyl 50 mcg IV. Moderate Sedation Time:  60 minutes. The patient was continuously monitored during the procedure by the interventional radiology nurse under my direct supervision. CONTRAST:  Isovue 300 approximately 80 cc. FLUOROSCOPY TIME:  Fluoroscopy Time: 13 minutes 54 seconds (3213 mGy). COMPLICATIONS: None immediate. TECHNIQUE: Informed written consent was obtained from the patient after a thorough discussion of the procedural risks, benefits and alternatives. All questions were addressed. Maximal Sterile Barrier Technique was utilized including caps, mask, sterile gowns, sterile gloves, sterile drape, hand hygiene and skin antiseptic. A timeout was performed prior to the initiation of the procedure. The right groin was prepped and draped in the usual sterile fashion. Thereafter using modified Seldinger technique, transfemoral access into the right common femoral artery was obtained without difficulty. Over a 0.035 inch guidewire, a 5 French Pinnacle sheath was inserted. Through this, and also over  0.035 inch guidewire, a 5 [place] JB 1 catheter was advanced to the aortic arch region and selectively positioned in the right common carotid artery, the right external carotid artery, the right vertebral artery, the left common carotid artery and the left vertebral artery. FINDINGS: The right common carotid arteriogram demonstrates the right external carotid artery and its major branches to be widely patent. A selective right external carotid arteriogram demonstrates no abnormal arteriovenous shunting between the extracranial arterial and venous systems, and also the dural venous drainage. The right internal carotid artery at the bulb to the cranial skull base opacifies normally. The petrous, cavernous and supraclinoid segments demonstrate wide patency. The right middle cerebral artery and the right anterior cerebral artery demonstrate wide patency. However, mass effect on the perisylvian triangle being pushed superiorly and somewhat medially secondary to the large right temporal hematoma. No evidence of tumor blush, or of stasis of contrast is noted in this region. The venous phase demonstrates delayed clearance of contrast from the venules. The drainage on the venous side is primarily into the left transverse sinus. The right transverse sinus and the right sigmoid sinus do not opacify. There is a significantly attenuated right internal jugular vein. The right vertebral artery origin is widely patent. The vessel is seen to opacify normally to the cranial skull base. Wide patency is seen of the right posterior-cerebellar artery and the right vertebrorbasilar junction. The opacified portion of the basilar artery, the posterior cerebral arteries, superior cerebellar arteries and the anterior-inferior cerebellar arteries is noted into the capillary and venous phases. Non-opacified blood is seen in the basilar artery from the contralateral vertebral artery. The venous phase again demonstrates nonvisualization of the  right transverse sinus and the right sigmoid sinus. Contrast is noted into the left transverse sinus, the left sigmoid sinus and the internal jugular vein. There appears to be stasis with prolonged clearance of the veins overlying the right cerebellar hemisphere underlying the tentorium cerebellum and the posterior aspect of the medial right occipital lobe. The left common carotid arteriogram demonstrates the left external carotid artery and its major branches to be widely patent. The left internal carotid artery at the bulb to the cranial skull base opacifies widely. The petrous, cavernous and supraclinoid segments demonstrate wide patency. A left posterior communicating artery is seen opacifying the left posterior cerebral artery distribution. There is approximately 50% stenosis of the mid M1 segment of the left middle cerebral artery. The MCA trifurcation branches and the left anterior cerebral artery, otherwise, opacify  into the capillary and venous phases. Again demonstrated is nonvisualization of the right transverse and sigmoid sinuses. The origin of left vertebral artery demonstrates patency though with mild tortuosity. The vessel, otherwise, is seen to opacify normally to the cranial skull base. The left posterior-inferior cerebellar artery and left vertebrobasilar junction demonstrate wide patency. The opacified portion of the basilar artery, the posterior cerebral arteries, the superior cerebellar arteries and the anterior-inferior cerebellar arteries progress into the capillary and venous phases. Again noted is nonvisualization of the right transverse sinus and the right sigmoid sinus. IMPRESSION: Angiographically nonvisualization of the right transverse sinus and sigmoid sinus with a severely attenuated caliber of the right internal jugular vein. This is very suspicious related to dural sinus thrombosis. Slow egress of contrast overlying the right cortical cerebral hemisphere in the venous phase  consistent with venous congestion, possibly related to the large temporal hematoma. Suspicion remains of a raised venous pressure overlying the right cerebral hemisphere. No angiographic evidence of extracranial or intracranial arteriovenous shunting at this time. PLAN: Findings reviewed with the patient's family and referring neurologist. Suggest repeat arteriogram in about 6-8 weeks to ensure no evidence of arteriovenous fistulous communications intracranially or extra cranially. And also to evaluate for evidence of recanalization of the occluded right transverse sinus and right sigmoid sinus. Electronically Signed   By: Luanne Bras M.D.   On: 07/10/2017 14:45    TTE - Very technically difficult study with poor endocardial definition. Definity contrast was not given. LVEF is grossly normal at 55-60%, mild LVH, inadequate for wall motion, grade 1 DD with indeterminate LV filling pressure.   HISTORY OF [place] COURSE ASSESSMENT/PLAN Justin Adams is a 72 y.o. male with history of prostate cancer, HTN, HLD, polycythemia, OSA admitted for right frontal headache and nausea. No tPA given due to [place].    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  Resultant HA much improved.  MRI  Right temporal ICH with likely right transverse sinus thrombosis  MRA  No AVM or aneurysm  MRV  thrombosis of the right transverse sinus, sigmoid sinus and jugular vein  DSA - Occluded RT Transverse and RT sigmoid sinus and poorly opacified RT internal jugular vein.  2D Echo EF 55%  Pan CT  Showed no malignancy but left kidney stone and hydronephrolsis  LDL 103  HgbA1c 5.3  Heparin IV for VTE prophylaxis  Fall precautions  Diet Heart Room service appropriate? Yes; Fluid  consistency: Thin   No antithrombotic prior to admission, now on lovenox therapeutic dose. Will consider switch to po NOACs tomorrow for preparation of discharge.   Ongoing aggressive stroke risk factor management  Therapy recommendations:  outpt PT  Disposition:  HOME  ICH - right temporal due to venous thrombosis  On lovenox therapeutic dose now  Neuro check  BP goal < 160  Cerebral venous thrombosis  Pan CT negative  Hypercoagulable work up so far negative  Unprovoked DVT at this time  According to the guideline, for unprovoked DVT in critical location, and pt in low risk of bleeding, anticoagulation should be life long. Will continue NOAC long term after discharge. Eliquis 5 mg BID started  Hypertension  Stable  BP goal < 160  Off cleviprex  Add amlodipine and lisinopril  Long term BP goal normotensive  Hyperlipidemia  Home meds:  None   LDL 103, goal < 70  Start lipitor 20mg  daily  Continue stain on discharge  Other Stroke Risk Factors  Advanced age  Obesity, Body mass index is 32.87 kg/m.   Obstructive sleep apnea, on CPAP at home  Polycythemia - Hb 17.5->17.5->16.2->16.3->16.2  Other Active Problems  Hx of prostate cancer s/p surgery  Hypokalemia - supplement  DISCHARGE EXAM Blood pressure (!) 143/80, pulse 78, temperature 98.3 F (36.8 C), temperature source Oral, resp. rate 20, height 6\' 3"  (1.905 m), weight 119.3 kg (263 lb), SpO2 97 %. General - Well nourished, well developed, in no apparent distress.  Ophthalmologic - fundi not visualized due to noncooperation.  Cardiovascular - Regular rate and rhythm with no murmur.  Mental Status: Alert, oriented, thought content appropriate. Speech fluent without evidence of aphasia. Able to follow allcommands without difficulty. Cranial Nerves: II: Visual fieldswith subtle left homonymous inferior quadrantanopsia. PERRL. III,IV, VI: ptosis not present,EOMI without  nystagmus. V,VII:No facial droop. Temp sensation normal bilaterally VIII:Hearingintact to conversation IX,X:Palaterises symmetrically ML:[id number] KCL:EXNTZGY tongue extension Motor: Right :Upper extremity 5/5Left: Upper extremity 5/5 Lower extremity 5/5Lower extremity 5/5 Normal tone throughout; no atrophy noted Sensory:Tempand light touch intact x 4 Deep Tendon Reflexes: 2+ and symmetric throughout Plantars: Right: downgoingLeft: downgoing Cerebellar:No ataxia with FNF bilaterally Gait:Deferred  Discharge Diet   Fall precautions Diet Heart Room service appropriate? Yes; Fluid consistency: Thin liquids  DISCHARGE PLAN  Disposition:  HOME   Eliquis (apixaban) daily for secondary stroke prevention.  Ongoing risk factor control by Primary Care Physician at time of discharge  Follow-up [place], Hal, MD in 2 weeks.  Follow-up with Dr. [facility] in 6 weeks, office to schedule an appointment.  Greater than 40 minutes were spent preparing discharge.  Mary Sella, ANP-C Neurology Stroke Team 07/14/2017 11:33 AM I have personally examined this patient, reviewed notes, independently viewed imaging studies, participated in medical decision making and plan of care.ROS completed by me personally and pertinent positives fully documented  I have made any additions or clarifications directly to the above note. Agree with note above. . Long discussion with patient and wife and answered questions.  Antony Contras, MD Medical Director [facility] Pager: [phone] 07/14/2017 1:34 PM

## 2017-07-14 NOTE — Progress Notes (Signed)
Pt discharged to home from hospital per orders from MD. Pt educated on discharge instructions. Pt verbalized understanding of all instructions. All pt questions answered. IV removed prior to discharge. Gauze dressing applied. Pt left unit via wheelchair accompanied by staff.

## 2017-07-14 NOTE — Progress Notes (Signed)
Physical Therapy Treatment Patient Details Name: Justin Adams MRN: [id number] DOB: 01/18/1946 Today's Date: 07/14/2017    History of Present Illness Mr. [employer]ANDOL CORP is a 72 y.o. male with history of prostate cancer, HTN, HLD, polycythemia, OSA admitted for right frontal headache and nausea. No tPA given due to [place]. 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     PT Comments    Pt progressing well. Pt scored a 16 on DGI indicating falls risk with higher level balance activities. Pt able to tolerate ambulation without AD and episode of LOB. Pt functioning near baseline. Pt to benefit from outpt PT to address balance deficits. Acute PT to con't to follow.  Follow Up Recommendations  Outpatient PT;Supervision/Assistance - 24 hour     Equipment Recommendations       Recommendations for Other Services       Precautions / Restrictions Precautions Precautions: Fall Restrictions Weight Bearing Restrictions: No    Mobility  Bed Mobility Overal bed mobility: Independent             General bed mobility comments: HOB elevated  Transfers Overall transfer level: Needs assistance Equipment used: Rolling walker (2 wheeled);None Transfers: Sit to/from Stand Sit to Stand: Min guard         General transfer comment: no physical assist, min guard due to not using RW  Ambulation/Gait Ambulation/Gait assistance: Min guard Ambulation Distance (Feet): 400 Feet Assistive device: None Gait Pattern/deviations: Step-to pattern;Decreased stride length Gait velocity: decr Gait velocity interpretation: Below normal speed for age/gender General Gait Details: v/c's to look ahead and not down at feet, no episodes of LOB, mild unsteadiness with head turns   Stairs Stairs: Yes   Stair Management: One rail Right;Alternating pattern Number of Stairs: 10 General stair comments: no episodes of LOB  Wheelchair Mobility    Modified Rankin (Stroke  Patients Only) Modified Rankin (Stroke Patients Only) Pre-Morbid Rankin Score: No symptoms Modified Rankin: Moderate disability     Balance Overall balance assessment: No apparent balance deficits (not formally assessed) Sitting-balance support: No upper extremity supported;Feet supported Sitting balance-Leahy Scale: Good Sitting balance - Comments: able to perform LB ADL seated EOB    Standing balance support: Bilateral upper extremity supported;During functional activity;No upper extremity supported Standing balance-Leahy Scale: Fair Standing balance comment: Pt able to static stand without UE support during functional task completion                  Standardized Balance Assessment Standardized Balance Assessment : Dynamic Gait Index   Dynamic Gait Index Level Surface: Mild Impairment Change in Gait Speed: Mild Impairment Gait with Horizontal Head Turns: Mild Impairment Gait with Vertical Head Turns: Mild Impairment Gait and Pivot Turn: Mild Impairment Step Over Obstacle: Mild Impairment Step Around Obstacles: Mild Impairment Steps: Mild Impairment Total Score: 16      Cognition Arousal/Alertness: Awake/alert Behavior During Therapy: WFL for tasks assessed/performed Overall Cognitive Status: Within Functional Limits for tasks assessed Area of Impairment: Memory                     Memory: Decreased short-term memory         General Comments: some decreased STM noted; provided Pt with 3 step directions during grooming task completion, Pt requiring cues to recall 2nd/3rd tasks after completion of task 1       Exercises      General Comments General comments (skin integrity, edema, etc.): spouse present during session  Pertinent Vitals/Pain Pain Assessment: No/denies pain    Home Living                      Prior Function            PT Goals (current goals can now be found in the care plan section) Acute Rehab PT  Goals Patient Stated Goal: go home today Progress towards PT goals: Progressing toward goals    Frequency    Min 4X/week      PT Plan Current plan remains appropriate    Co-evaluation              AM-PAC PT "6 Clicks" Daily Activity  Outcome Measure  Difficulty turning over in bed (including adjusting bedclothes, sheets and blankets)?: None Difficulty moving from lying on back to sitting on the side of the bed? : None Difficulty sitting down on and standing up from a chair with arms (e.g., wheelchair, bedside commode, etc,.)?: A Little Help needed moving to and from a bed to chair (including a wheelchair)?: A Little Help needed walking in hospital room?: A Little Help needed climbing 3-5 steps with a railing? : A Little 6 Click Score: 20    End of Session Equipment Utilized During Treatment: Gait belt Activity Tolerance: Patient limited by fatigue Patient left: in chair;with call bell/phone within reach;with chair alarm set;with family/visitor present Nurse Communication: Mobility status PT Visit Diagnosis: Unsteadiness on feet (R26.81);Muscle weakness (generalized) (M62.81)     Time: 6433-2951 PT Time Calculation (min) (ACUTE ONLY): 30 min  Charges:  $Gait Training: 8-22 mins $Neuromuscular Re-education: 8-22 mins                    G Codes:       Kittie Plater, PT, DPT Pager #: [phone] Office #: [phone]    [place] 07/14/2017, 1:06 PM

## 2017-07-14 NOTE — Care Management Important Message (Signed)
Important Message  Patient Details  Name: Justin Adams MRN: [id number] Date of Birth: 04/26/1946   Medicare Important Message Given:  Yes    Delorse Lek 07/14/2017, 3:49 PM

## 2017-07-14 NOTE — Progress Notes (Signed)
ANTICOAGULATION CONSULT NOTE - Initial Consult  Pharmacy Consult:  Lovenox >> Eliquis Indication: Cerebral venous thrombosis  Allergies  Allergen Reactions  . Iodinated Diagnostic Agents Rash    Patient Measurements: Height: 6\' 3"  (190.5 cm) Weight: 263 lb (119.3 kg) IBW/kg (Calculated) : 84.5  Vital Signs: Temp: 98.5 F (36.9 C) (02/04 0654) Temp Source: Oral (02/04 0654) BP: 132/80 (02/04 0654) Pulse Rate: 78 (02/04 0654)  Labs: Recent Labs    07/11/17 1426 07/11/17 2317  07/12/17 0232 07/12/17 1004 07/13/17 0342 07/14/17 0357  HGB  --   --    < > 16.3  --  16.2 15.7  HCT  --   --   --  48.2  --  48.3 47.1  PLT  --   --   --  211  --  205 210  HEPARINUNFRC 0.91* 0.92*  --   --  0.92*  --   --   CREATININE  --   --   --  0.81  --  0.90 0.87   < > = values in this interval not displayed.    Estimated Creatinine Clearance: 105.2 mL/min (by C-G formula based on SCr of 0.87 mg/dL).    Assessment: 33 YOM presented with subacute right temporal lobe hemorrhage complicated by sinus thrombosis.  He is not on anticoagulation prior to admission.  Patient went to IR with Dr. Estanislado Pandy for 4 vessel and RT ECA arteriogram which showed occluded RT transverse and RT sigmoid sinus with poorly opacified RT internal juglular vein along with prolonged clearance of contrast from RT cerebral hemisphere.  Patient to be transitioned from SQ Lovenox to Eliquis in anticipation of discharge.  Discussed with Neuro PA, given bleeding complication and patient has been on full anticoagulation for 5 days, will not load Eliquis with the typical 10mg  PO BID x 7 days for VTE treatment.   Goal of Therapy:  Appropriate anticoagulation    Plan:  D/C Lovenox Eliquis 5mg  PO BID, start tonight Pharmacy will sign off and follow peripherally.  Thank you for the consult!   Turner Baillie D. Mina Marble, PharmD, BCPS Pager:  [phone] 07/14/2017, 10:10 AM

## 2017-07-15 ENCOUNTER — Encounter: Payer: Self-pay | Admitting: Neurology

## 2017-07-15 ENCOUNTER — Telehealth: Payer: Self-pay | Admitting: Neurology

## 2017-07-15 LAB — FACTOR 5 LEIDEN

## 2017-07-15 NOTE — Telephone Encounter (Signed)
Pt was advised by Zacarias Pontes to contact Dr Clydene Fake RN to have Dr Leonie Man contacted re: a slight head ache pt is experiencing.  Pt is asking for a call back to be advised as to what he can take since he has been told he is unable to take Tylenol or Advil or any other pain medication due to type medication he is on.  Please call

## 2017-07-15 NOTE — Telephone Encounter (Signed)
Patient was discharge by Dr. Leonie Man on 07/14/2017 from the hospital.

## 2017-07-15 NOTE — Telephone Encounter (Signed)
I returned the patient's phone call and clarified that it was okay to take 2 Tylenol tablets if he got a severe headache but if he did not get relief to call back. If he required to take Tylenol several days a week to call back as well. We expect his headaches to gradually get better as the blood clot hence cerebral venous sinuses resolves with blood thinners

## 2017-07-16 ENCOUNTER — Encounter: Payer: Self-pay | Admitting: Neurology

## 2017-07-17 NOTE — Telephone Encounter (Signed)
Message sent to Dr. Leonie Man. Dr. Leonie Man please read all phone notes from pt, I left a vm.

## 2017-07-17 NOTE — Telephone Encounter (Signed)
Pt has called back and the message from [place] was relayed to pt, he stated he will contact Dr Felipa Eth. No call back requested

## 2017-07-17 NOTE — Telephone Encounter (Addendum)
:  LEft vm for patient to call back. Rn left message that once he is discharge cannot call the hospital for the MD, he should call the office. Rn also left vm that mychart message was sent about seeking PCP for his not sleeping, and urine red. Rn left vm that he should call his PCP about his blood pressure, his nerves, and cant sleep. RN also stated on vm that he should seek a urgent care if his urine is red.  Pt was discharge from hospital on 07/14/2017 by Dr. Leonie Man.

## 2017-07-17 NOTE — Telephone Encounter (Signed)
Revised. 

## 2017-07-17 NOTE — Telephone Encounter (Signed)
revised 

## 2017-07-17 NOTE — Telephone Encounter (Signed)
Pt called Justin Adams to get in touch with Dr. Leonie Man but was told to contact us to get a message to him. Pt is wanting a call back to discuss a few different things such as low blood pressure, he hasn't been able to sleep, his urine has been red and also he is wanting to know if anything can be prescribed to help calm his nerves. Please call back to discuss

## 2017-07-22 DIAGNOSIS — G08 Intracranial and intraspinal phlebitis and thrombophlebitis: Secondary | ICD-10-CM | POA: Diagnosis not present

## 2017-07-22 DIAGNOSIS — I10 Essential (primary) hypertension: Secondary | ICD-10-CM | POA: Diagnosis not present

## 2017-07-22 DIAGNOSIS — F5101 Primary insomnia: Secondary | ICD-10-CM | POA: Diagnosis not present

## 2017-07-28 ENCOUNTER — Encounter: Payer: Self-pay | Admitting: Physical Therapy

## 2017-07-28 ENCOUNTER — Telehealth: Payer: Self-pay | Admitting: Neurology

## 2017-07-28 ENCOUNTER — Other Ambulatory Visit: Payer: Self-pay

## 2017-07-28 ENCOUNTER — Ambulatory Visit: Payer: Medicare Other | Attending: Neurology | Admitting: Physical Therapy

## 2017-07-28 VITALS — BP 108/67 | HR 89

## 2017-07-28 DIAGNOSIS — I69154 Hemiplegia and hemiparesis following nontraumatic intracerebral hemorrhage affecting left non-dominant side: Secondary | ICD-10-CM | POA: Diagnosis not present

## 2017-07-28 DIAGNOSIS — M6281 Muscle weakness (generalized): Secondary | ICD-10-CM | POA: Diagnosis not present

## 2017-07-28 DIAGNOSIS — R29818 Other symptoms and signs involving the nervous system: Secondary | ICD-10-CM | POA: Diagnosis not present

## 2017-07-28 DIAGNOSIS — R2681 Unsteadiness on feet: Secondary | ICD-10-CM | POA: Diagnosis not present

## 2017-07-28 NOTE — Therapy (Signed)
[facility] [address] [place], [place], [address] Phone: [phone]   Fax:  [phone]  Physical Therapy Evaluation  Patient Details  Name: Justin Adams MRN: [id number] Date of Birth: Nov 20, 1945 Referring Provider: Rosalin Hawking, MD   Encounter Date: 07/28/2017  PT End of Session - 07/28/17 1337    Visit Number  1    Number of Visits  13    Date for PT Re-Evaluation  09/26/17    Authorization Type  Medicare and [employer]ARP, Plan F, covered 100%    PT Start Time  0845    PT Stop Time  0932    PT Time Calculation (min)  47 min    Activity Tolerance  Patient tolerated treatment well    Behavior During Therapy  [facility] for tasks assessed/performed       Past Medical History:  Diagnosis Date  . Adenomatous polyp   . Cancer [facility]) 2003   prostate  . Hemorrhoids   . Hypercholesteremia   . Hypertension   . Muscular degeneration   . Nephrolithiasis   . Obesity   . Polycythemia   . Shingles   . Sleep apnea     Past Surgical History:  Procedure Laterality Date  . CHOLECYSTECTOMY    . IR ANGIO EXTERNAL CAROTID SEL EXT CAROTID UNI R MOD SED  07/10/2017  . IR ANGIO INTRA EXTRACRAN SEL INTERNAL CAROTID BILAT MOD SED  07/10/2017  . IR ANGIO VERTEBRAL SEL VERTEBRAL BILAT MOD SED  07/10/2017  . LEFT HEART CATHETERIZATION WITH CORONARY ANGIOGRAM N/A 03/15/2013   Procedure: LEFT HEART CATHETERIZATION WITH CORONARY ANGIOGRAM;  Surgeon: Minus Breeding, MD;  Location: [facility] CATH LAB;  Service: Cardiovascular;  Laterality: N/A;  . PROSTATECTOMY    . STONE EXTRACTION WITH BASKET      Vitals:   07/28/17 0847  BP: 108/67  Pulse: 89     Subjective Assessment - 07/28/17 0849    Subjective  Pt reports symptoms began with severe HA; went to hospital x 5 days where they found a blood clot and R sided hemorrhage.  Since D/C home pt has had a few mild headaches.  Pt reports not sleeping well at night, decreased endurance but pt denies  changes in strength, balance, vision, swallowing, speech, dizziness, changes in sensation or changes in coordination.      Patient is accompained by:  Family member    Pertinent History  HTN, hyperlipidemia, adenomatous polyp, prostate CA, muscular degeneration, nephrolithiasis, obesity, polycythemia, and shingles    Patient Stated Goals  Pt would like to have less back pain, improved endurance, and increased LE strength to get off of low surfaces     Currently in Pain?  No/denies h/o LBP but not focus of this episode of care         [facility] PT Assessment - 07/28/17 0855      Assessment   Medical Diagnosis  R non-traumatic hemorrhage    Referring Provider  Rosalin Hawking, MD    Onset Date/Surgical Date  07/09/17    Hand Dominance  Right    Prior Therapy  none      Precautions   Precautions  Other (comment)    Precaution Comments  HTN, hyperlipidemia, adenomatous polyp, prostate CA, muscular degeneration, nephrolithiasis, obesity, polycythemia, and shingles      Balance Screen   Has the patient fallen in the past 6 months  No    Has the patient had a decrease in activity level because of  a fear of falling?   No    Is the patient reluctant to leave their home because of a fear of falling?   No      Home Film/video editor residence    Living Arrangements  Spouse/significant other    Type of [place] to enter    Entrance Stairs-Number of Steps  2    Entrance Stairs-Rails  None front and back, 5 steps with rails    [place] to live on main level with bedroom/bathroom    Home Equipment  None      Prior Function   Level of Independence  Independent    Leisure  works out at [employer] 3-7 days a week; endurance machines and weights but MD has restricted pt to endurance activities and light weights only right now      Cognition   Overall Cognitive Status  Within Functional Limits for tasks assessed      Observation/Other  Assessments   Focus on Therapeutic Outcomes (FOTO)   66% (34% limited; predicted 22% limitation at D/C)      Sensation   Light Touch  Appears Intact      ROM / Strength   AROM / PROM / Strength  Strength      Strength   Overall Strength  Within functional limits for tasks performed    Overall Strength Comments  sligthly decreased strength RLE compared to L but pt reports premorbid back pain and issues on R side.  Reports difficulty getting out of wife's lower [place]; pt drives SUV without any difficulty      Ambulation/Gait   Ambulation/Gait  Yes    Ambulation/Gait Assistance  7: Independent    Ambulation Distance (Feet)  1385 Feet    Assistive device  None    Gait Pattern  Step-through pattern    Ambulation Surface  Level;Indoor      6 Minute Walk- Baseline   6 Minute Walk- Baseline  yes    BP (mmHg)  108/67    HR (bpm)  89    02 Sat (%RA)  97 %    Modified Borg Scale for Dyspnea  0- Nothing at all    Perceived Rate of Exertion (Borg)  6-      6 Minute walk- Post Test   6 Minute Walk Post Test  yes    BP (mmHg)  127/77    HR (bpm)  110    02 Sat (%RA)  97 %    Modified Borg Scale for Dyspnea  2- Mild shortness of breath    Perceived Rate of Exertion (Borg)  13- Somewhat hard      6 minute walk test results    Aerobic Endurance Distance Walked  1385    Endurance additional comments  mild back pain      Standardized Balance Assessment   Standardized Balance Assessment  Five Times Sit to Stand;Berg Balance Test    Five times sit to stand comments   18.38 seconds from chair, using UE to stand      [employer]   Sit to Stand  Able to stand  independently using hands    Standing Unsupported  Able to stand safely 2 minutes    Sitting with Back Unsupported but Feet Supported on Floor or Stool  Able to sit safely and securely 2 minutes    Stand to Sit  Controls  descent by using hands    Transfers  Able to transfer safely, minor use of hands    Standing Unsupported with  Eyes Closed  Able to stand 10 seconds with supervision    Standing Ubsupported with Feet Together  Able to place feet together independently and stand for 1 minute with supervision    From Standing, Reach Forward with Outstretched Arm  Can reach confidently >25 cm (10")    From Standing Position, Pick up Object from Floor  Able to pick up shoe, needs supervision    From Standing Position, Turn to Look Behind Over each Shoulder  Turn sideways only but maintains balance    Turn 360 Degrees  Able to turn 360 degrees safely in 4 seconds or less    Standing Unsupported, Alternately Place Feet on Step/Stool  Able to complete 4 steps without aid or supervision    Standing Unsupported, One Foot in Front  Able to plae foot ahead of the other independently and hold 30 seconds    Standing on One Leg  Able to lift leg independently and hold equal to or more than 3 seconds    Total Score  44    Berg comment:  44/56     Patient demonstrates increased fall risk as noted by score of  44/56 on Berg Balance Scale.  (<36= high risk for falls, close to 100%; 37-45 significant >80%; 46-51 moderate >50%; 52-55 lower >25%)         Objective measurements completed on examination: See above findings.              PT Education - 07/28/17 1337    Education provided  Yes    Education Details  Clinical findings, PT POC, goals    Person(s) Educated  Patient;Spouse    Methods  Explanation    Comprehension  Verbalized understanding       PT Short Term Goals - 07/28/17 1342      PT SHORT TERM GOAL #1   Title  Pt will report performing walking program, HEP and [employer] 2-3 days/week    Time  4    Period  Weeks    Status  New    Target Date  08/27/17      PT SHORT TERM GOAL #2   Title  Pt will improve functional LE strength decreasing five time sit to stand to </= 15 seconds    Baseline  18.38 seconds with UE support    Time  4    Period  Weeks    Status  New    Target Date  08/27/17       PT SHORT TERM GOAL #3   Title  Pt will improve BERG balance to >/= 50/56    Baseline  44/56    Time  4    Period  Weeks    Status  New    Target Date  08/27/17      PT SHORT TERM GOAL #4   Title  Pt will improve functional endurance on 6 minute walk test by increasing distance by 150'    Baseline  1385 feet    Time  4    Period  Weeks    Status  New    Target Date  08/27/17        PT Long Term Goals - 07/28/17 1345      PT LONG TERM GOAL #1   Title  Pt will report performing HEP, walking program and [employer] 5/7 days a week    Time  8    Period  Weeks    Status  New    Target Date  09/26/17      PT LONG TERM GOAL #2   Title  Pt will report increase in overall function as indicated by increase in FOTO to >/= 80% by D/C    Baseline  66%    Time  8    Period  Weeks    Status  New    Target Date  09/26/17      PT LONG TERM GOAL #3   Title  Will improve five time sit to stand to </= 13 seconds    Time  8    Period  Weeks    Status  New    Target Date  09/26/17      PT LONG TERM GOAL #4   Title  Will improve BERG score to >/= 53/56 to lower falls risk    Time  8    Period  Weeks    Status  New    Target Date  09/26/17      PT LONG TERM GOAL #5   Title  Will improve 6 minute walk test distance by 250' (from eval)    Baseline  1385 at eval    Time  8    Period  Weeks    Status  New    Target Date  09/26/17             Plan - 07/28/17 1339    Clinical Impression Statement  Pt is a 72 year old male referred to Neuro OPPT for evaluation s/p nontraumatic hemorrhage of R cerebral (temporal) hemisphere due to thrombosis; goal SBP <160.  [name] PMH is significant for the following: HTN, hyperlipidemia, adenomatous polyp, prostate CA, muscular degeneration, nephrolithiasis, obesity, polycythemia, and shingles. The following deficits were noted during pt's exam: impaired functional LE strength, impaired balance and impaired functional endurance.  [name] five  time sit to stand and BERG score indicates pt is at significant risk for falls. Pt would benefit from skilled PT to address these impairments and functional limitations to maximize functional mobility independence and reduce falls risk.    History and Personal Factors relevant to plan of care:  independent, driving and working out 7-3 days a week prior to CVA, HTN, hyperlipidemia, adenomatous polyp, prostate CA, muscular degeneration, nephrolithiasis, obesity, polycythemia, and shingles, goal is to keep SBP <160    Clinical Presentation  Stable    Clinical Presentation due to:  independent, driving and working out 7-3 days a week prior to CVA, HTN, hyperlipidemia, adenomatous polyp, prostate CA, muscular degeneration, nephrolithiasis, obesity, polycythemia, and shingles, goal is to keep SBP <160    Clinical Decision Making  Low    Rehab Potential  Good    PT Frequency  Other (comment) 2x/week x 4 weeks, 1x/week x 4 weeks    PT Duration  8 weeks    PT Treatment/Interventions  ADLs/Self Care Home Management;Gait training;Stair training;Functional mobility training;Therapeutic activities;Therapeutic exercise;Balance training;Neuromuscular re-education;Patient/family education;Energy conservation    PT Next Visit Plan  start walking program; review endurance exercises pt can perform at [employer], balance and LE strengthening HEP    Consulted and Agree with Plan of Care  Patient;Family member/caregiver    Family Member Consulted  wife       Patient will benefit from skilled therapeutic intervention in order to improve the following deficits and impairments:  Decreased  activity tolerance, Decreased balance, Decreased endurance, Decreased strength  Visit Diagnosis: Muscle weakness (generalized)  Unsteadiness on feet  Other symptoms and signs involving the nervous system     Problem List Patient Active Problem List   Diagnosis Date Noted  . Cerebral venous thrombosis 07/13/2017  .  Intracerebral hemorrhage 07/10/2017  . ICH (intracerebral hemorrhage) ([place]) 07/10/2017  . Unstable angina ([place]) 03/12/2013    Rico Junker, PT, DPT 07/28/17    1:52 PM    [facility] [address] [place], [place], [address] Phone: [phone]   Fax:  [phone]  Name: Justin Adams MRN: [id number] Date of Birth: 10/09/1945

## 2017-07-28 NOTE — Telephone Encounter (Signed)
Rn call patient back wanting to be seen sooner. PT states he has a trip schedule but does not want to cancel it. Rn ask pt if he is having any issues now. Pt stated she is not having any  Issues now but wants a CAT scan done on his brain. He wants to know if the blood clot is gone. Rn stated he had a CT scan done 07/12/2017.Pt stated he did not want to cancel his trip because he is not sure how he will feel in June 2019. Pt is currently in outpatient therapy. Rn stated a message will be sent to Dr. Erlinda Hong. Pt states a call can be made on cell phone.

## 2017-07-28 NOTE — Telephone Encounter (Signed)
Hi, Justin Adams, please cancel tomorrow's appointment. Thanks.   Rosalin Hawking, MD PhD Stroke Neurology 07/28/2017 6:35 PM

## 2017-07-28 NOTE — Telephone Encounter (Signed)
Talked with pt over the phone. He has a trip in July and he worries whether he needs to keep it or cancel it. Therefore, he wants to have a CT to see if the blood is gone or if the vessel is opened up. I told him that at this time it is too early to see the blood absorption or vessel open up. We will have no new information if we do it today or tomorrow. It is good timing to do them when he comes for 08/26/17 appointment. He has no new issues at this time and I do not think his appointment tomorrow will achieve anything. He understood and request to cancel tomorrow's appointment. I agreed. Will see him on 08/26/17. He will call if there is any new symptoms.   Rosalin Hawking, MD PhD Stroke Neurology 07/28/2017 6:25 PM

## 2017-07-29 ENCOUNTER — Ambulatory Visit: Payer: Medicare Other | Admitting: Neurology

## 2017-07-29 NOTE — Telephone Encounter (Signed)
Appt cancel for today per Dr. Erlinda Hong.

## 2017-08-05 DIAGNOSIS — R319 Hematuria, unspecified: Secondary | ICD-10-CM | POA: Diagnosis not present

## 2017-08-06 ENCOUNTER — Ambulatory Visit: Payer: Medicare Other | Admitting: Physical Therapy

## 2017-08-06 ENCOUNTER — Encounter: Payer: Self-pay | Admitting: Physical Therapy

## 2017-08-06 DIAGNOSIS — I69154 Hemiplegia and hemiparesis following nontraumatic intracerebral hemorrhage affecting left non-dominant side: Secondary | ICD-10-CM | POA: Diagnosis not present

## 2017-08-06 DIAGNOSIS — M6281 Muscle weakness (generalized): Secondary | ICD-10-CM | POA: Diagnosis not present

## 2017-08-06 DIAGNOSIS — R2681 Unsteadiness on feet: Secondary | ICD-10-CM

## 2017-08-06 DIAGNOSIS — R29818 Other symptoms and signs involving the nervous system: Secondary | ICD-10-CM | POA: Diagnosis not present

## 2017-08-06 NOTE — Patient Instructions (Addendum)
WALKING  Walking is a great form of exercise to increase your strength, endurance and overall fitness.  A walking program can help you start slowly and gradually build endurance as you go.  Everyone's ability is different, so each person's starting point will be different.  You do not have to follow them exactly.  The are just samples. You should simply find out what's right for you and stick to that program.   In the beginning, you'll start off walking 2-3 times a day for short distances.  As you get stronger, you'll be walking further at just 1-2 times per day.  A. You Can Walk For A Certain Length Of Time Each Day    Walk 6 minutes 1-2 times per day.  Increase 1-2 minutes every 5-7  days (1-2 times a day). Only increased as the time gets easier.   Work your way up to 20-25  minutes (1-2 times per day).   Perform these in a corner with a chair in front for safety: Feet Together (Compliant Surface) Varied Arm Positions - Eyes Closed    Stand on compliant surface: _pillows/blanket/soft surface_ with feet together and arms at sides. Close eyes and visualize upright position. Hold_30_ seconds. Repeat _3_ times per session. Do _1-2_ sessions per day.  Copyright  VHI. All rights reserved.   Feet Apart (Compliant Surface) Head Motion - Eyes Closed    Stand on compliant surface: pillows/blankets/soft surfaces_ with feet shoulder width apart. Close eyes and move head slowly: 1. Up and down x 10 reps 2. Left and right x 10 reps 3. Up to the right/down to the left x 10 reps 4. Up to the left/down to the right x 10 reps Do _1-2_ sessions per day.  Copyright  VHI. All rights reserved.   Single Leg - Eyes Open    Holding support, lift right leg while maintaining balance over left leg. Progress to removing hands from support surface for longer periods of time. Hold__20__ seconds.  Then lift left leg up while standing on the right leg. Progress to letting go of chair as able. Hold for  20 seconds.  Repeat _3_ times standing on each leg per session. Do _1-2_ sessions per day.  Copyright  VHI. All rights reserved.     TANDEM STANCE WITH SUPPORT  Stand in front of a chair for support. Then place the heel of one foot so that it is touching the toes of the other foot. Maintain your balance in this position trying to progressively remove hands from support surface. Hold for 20 seconds.  Perform 3 reps with each foot forward, alternating feet position.  1-2 times a day.

## 2017-08-07 ENCOUNTER — Telehealth: Payer: Self-pay | Admitting: Neurology

## 2017-08-07 ENCOUNTER — Other Ambulatory Visit: Payer: Self-pay

## 2017-08-07 ENCOUNTER — Telehealth (HOSPITAL_COMMUNITY): Payer: Self-pay

## 2017-08-07 MED ORDER — APIXABAN 5 MG PO TABS: 5.0000 mg | ORAL_TABLET | Freq: Two times a day (BID) | ORAL | 0 refills | Status: DC

## 2017-08-07 NOTE — Therapy (Signed)
[facility] [address] [place], [place], [address] Phone: [phone]   Fax:  [phone]  Physical Therapy Treatment  Patient Details  Name: Justin Adams MRN: [id number] Date of Birth: 1946-03-01 Referring Provider: Rosalin Hawking, MD   Encounter Date: 08/06/2017  PT End of Session - 08/06/17 0937    Visit Number  2    Number of Visits  13    Date for PT Re-Evaluation  09/26/17    Authorization Type  Medicare and AARP, Plan F, covered 100%    PT Start Time  0935    PT Stop Time  1015    PT Time Calculation (min)  40 min    Equipment Utilized During Treatment  Gait belt    Activity Tolerance  Patient tolerated treatment well    Behavior During Therapy  [facility] for tasks assessed/performed       Past Medical History:  Diagnosis Date  . Adenomatous polyp   . Cancer [facility]) 2003   prostate  . Hemorrhoids   . Hypercholesteremia   . Hypertension   . Muscular degeneration   . Nephrolithiasis   . Obesity   . Polycythemia   . Shingles   . Sleep apnea     Past Surgical History:  Procedure Laterality Date  . CHOLECYSTECTOMY    . IR ANGIO EXTERNAL CAROTID SEL EXT CAROTID UNI R MOD SED  07/10/2017  . IR ANGIO INTRA EXTRACRAN SEL INTERNAL CAROTID BILAT MOD SED  07/10/2017  . IR ANGIO VERTEBRAL SEL VERTEBRAL BILAT MOD SED  07/10/2017  . LEFT HEART CATHETERIZATION WITH CORONARY ANGIOGRAM N/A 03/15/2013   Procedure: LEFT HEART CATHETERIZATION WITH CORONARY ANGIOGRAM;  Surgeon: Minus Breeding, MD;  Location: [facility] CATH LAB;  Service: Cardiovascular;  Laterality: N/A;  . PROSTATECTOMY    . STONE EXTRACTION WITH BASKET      There were no vitals filed for this visit.  Subjective Assessment - 08/06/17 0936    Subjective  No new complaitns. No falls or pain to report. Has not been to the gym due to restrictions. No headache today.     Pertinent History  HTN, hyperlipidemia, adenomatous polyp, prostate CA, muscular  degeneration, nephrolithiasis, obesity, polycythemia, and shingles    Patient Stated Goals  Pt would like to have less back pain, improved endurance, and increased LE strength to get off of low surfaces     Currently in Pain?  No/denies      treatment: Issued the following to pt's HEP today with min guard assist for balance. Cues on posture, weight shifting and ex technique provided.  WALKING  Walking is a great form of exercise to increase your strength, endurance and overall fitness.  A walking program can help you start slowly and gradually build endurance as you go.  Everyone's ability is different, so each person's starting point will be different.  You do not have to follow them exactly.  The are just samples. You should simply find out what's right for you and stick to that program.   In the beginning, you'll start off walking 2-3 times a day for short distances.  As you get stronger, you'll be walking further at just 1-2 times per day.  A. You Can Walk For A Certain Length Of Time Each Day    Walk 6 minutes 1-2 times per day.  Increase 1-2 minutes every 5-7  days (1-2 times a day). Only increased as the time gets easier.   Work your way up  to 20-25  minutes (1-2 times per day).   Perform these in a corner with a chair in front for safety: Feet Together (Compliant Surface) Varied Arm Positions - Eyes Closed    Stand on compliant surface: _pillows/blanket/soft surface_ with feet together and arms at sides. Close eyes and visualize upright position. Hold_30_ seconds. Repeat _3_ times per session. Do _1-2_ sessions per day.  Copyright  VHI. All rights reserved.   Feet Apart (Compliant Surface) Head Motion - Eyes Closed    Stand on compliant surface: pillows/blankets/soft surfaces_ with feet shoulder width apart. Close eyes and move head slowly: 1. Up and down x 10 reps 2. Left and right x 10 reps 3. Up to the right/down to the left x 10 reps 4. Up to the left/down to the  right x 10 reps Do _1-2_ sessions per day.  Copyright  VHI. All rights reserved.   Single Leg - Eyes Open    Holding support, lift right leg while maintaining balance over left leg. Progress to removing hands from support surface for longer periods of time. Hold__20__ seconds.  Then lift left leg up while standing on the right leg. Progress to letting go of chair as able. Hold for 20 seconds.  Repeat _3_ times standing on each leg per session. Do _1-2_ sessions per day.  Copyright  VHI. All rights reserved.     TANDEM STANCE WITH SUPPORT  Stand in front of a chair for support. Then place the heel of one foot so that it is touching the toes of the other foot. Maintain your balance in this position trying to progressively remove hands from support surface. Hold for 20 seconds.  Perform 3 reps with each foot forward, alternating feet position.  1-2 times a day.       PT Education - 08/06/17 1011    Education provided  Yes    Education Details  issued walking program and HEP for balance.     Person(s) Educated  Patient    Methods  Explanation;Demonstration;Verbal cues;Handout    Comprehension  Verbalized understanding;Returned demonstration       PT Short Term Goals - 07/28/17 1342      PT SHORT TERM GOAL #1   Title  Pt will report performing walking program, HEP and [employer] 2-3 days/week    Time  4    Period  Weeks    Status  New    Target Date  08/27/17      PT SHORT TERM GOAL #2   Title  Pt will improve functional LE strength decreasing five time sit to stand to </= 15 seconds    Baseline  18.38 seconds with UE support    Time  4    Period  Weeks    Status  New    Target Date  08/27/17      PT SHORT TERM GOAL #3   Title  Pt will improve BERG balance to >/= 50/56    Baseline  44/56    Time  4    Period  Weeks    Status  New    Target Date  08/27/17      PT SHORT TERM GOAL #4   Title  Pt will improve functional endurance on 6 minute walk test  by increasing distance by 150'    Baseline  1385 feet    Time  4    Period  Weeks    Status  New    Target Date  08/27/17        PT Long Term Goals - 07/28/17 1345      PT LONG TERM GOAL #1   Title  Pt will report performing HEP, walking program and [employer] 5/7 days a week    Time  8    Period  Weeks    Status  New    Target Date  09/26/17      PT LONG TERM GOAL #2   Title  Pt will report increase in overall function as indicated by increase in FOTO to >/= 80% by D/C    Baseline  66%    Time  8    Period  Weeks    Status  New    Target Date  09/26/17      PT LONG TERM GOAL #3   Title  Will improve five time sit to stand to </= 13 seconds    Time  8    Period  Weeks    Status  New    Target Date  09/26/17      PT LONG TERM GOAL #4   Title  Will improve BERG score to >/= 53/56 to lower falls risk    Time  8    Period  Weeks    Status  New    Target Date  09/26/17      PT LONG TERM GOAL #5   Title  Will improve 6 minute walk test distance by 250' (from eval)    Baseline  1385 at eval    Time  8    Period  Weeks    Status  New    Target Date  09/26/17            Plan - 08/06/17 6712    Clinical Impression Statement  Today's skilled session focused on establishment of a walking program for home to address activity tolerance and on an HEP to address balance. No issues were reported in session today. Pt is progressing toward goals and should benefit from continued PT to progress toward unmet goals.     Rehab Potential  Good    PT Frequency  Other (comment) 2x/week x 4 weeks, 1x/week x 4 weeks    PT Duration  8 weeks    PT Treatment/Interventions  ADLs/Self Care Home Management;Gait training;Stair training;Functional mobility training;Therapeutic activities;Therapeutic exercise;Balance training;Neuromuscular re-education;Patient/family education;Energy conservation    PT Next Visit Plan  continue to work on LE strengthening- step ups, step downs, wall  slides and high level balance activiites- balance board, stepping strategies     Consulted and Agree with Plan of Care  Patient;Family member/caregiver    Family Member Consulted  wife       Patient will benefit from skilled therapeutic intervention in order to improve the following deficits and impairments:  Decreased activity tolerance, Decreased balance, Decreased endurance, Decreased strength  Visit Diagnosis: Hemiplegia and hemiparesis following nontraumatic intracerebral hemorrhage affecting left non-dominant side (HCC)  Unsteadiness on feet  Muscle weakness (generalized)     Problem List Patient Active Problem List   Diagnosis Date Noted  . Cerebral venous thrombosis 07/13/2017  . Intracerebral hemorrhage 07/10/2017  . ICH (intracerebral hemorrhage) ([place]) 07/10/2017  . Unstable angina ([place]) 03/12/2013    Willow Ora 08/07/2017, 2:35 PM  [facility] [address] [place], [place], [address] Phone: [phone]   Fax:  [phone]  Name: NICKOLAI RINKS MRN: [id number] Date of Birth: 1946/02/14

## 2017-08-07 NOTE — Telephone Encounter (Signed)
Rn sent 90 day supply of eliquis to patients [employer].

## 2017-08-07 NOTE — Telephone Encounter (Signed)
Pt has called stating that he saw Dr Leonie Man in the hospital and at that time he was put on apixaban (ELIQUIS) 5 MG TABS tablet pt is asking that the prescription be faxed to [facility] @ [phone] so that he can get it for $30.00 instead of $290.00 if he were to get it from anywhere else. Please call

## 2017-08-07 NOTE — Telephone Encounter (Signed)
Called to schedule 6 to 8 wk f/u angio, no answer, left vm. AW

## 2017-08-08 ENCOUNTER — Other Ambulatory Visit: Payer: Self-pay | Admitting: Neurology

## 2017-08-08 ENCOUNTER — Ambulatory Visit: Payer: Medicare Other | Attending: Neurology | Admitting: Physical Therapy

## 2017-08-08 ENCOUNTER — Encounter: Payer: Self-pay | Admitting: Physical Therapy

## 2017-08-08 ENCOUNTER — Encounter: Payer: Self-pay | Admitting: Neurology

## 2017-08-08 DIAGNOSIS — R2681 Unsteadiness on feet: Secondary | ICD-10-CM | POA: Diagnosis not present

## 2017-08-08 DIAGNOSIS — M6281 Muscle weakness (generalized): Secondary | ICD-10-CM | POA: Diagnosis not present

## 2017-08-08 DIAGNOSIS — R29818 Other symptoms and signs involving the nervous system: Secondary | ICD-10-CM | POA: Diagnosis not present

## 2017-08-08 DIAGNOSIS — I69154 Hemiplegia and hemiparesis following nontraumatic intracerebral hemorrhage affecting left non-dominant side: Secondary | ICD-10-CM | POA: Diagnosis not present

## 2017-08-08 NOTE — Therapy (Signed)
[facility] [address] [place], [place], [address] Phone: [phone]   Fax:  [phone]  Physical Therapy Treatment  Patient Details  Name: Justin Adams MRN: [id number] Date of Birth: 1945/06/16 Referring Provider: Rosalin Hawking, MD   Encounter Date: 08/08/2017  PT End of Session - 08/08/17 1639    Visit Number  3    Number of Visits  13    Date for PT Re-Evaluation  09/26/17    Authorization Type  Medicare and AARP, Plan F, covered 100%    PT Start Time  1317    PT Stop Time  1400    PT Time Calculation (min)  43 min    Equipment Utilized During Treatment  Gait belt    Activity Tolerance  Patient tolerated treatment well    Behavior During Therapy  WFL for tasks assessed/performed       Past Medical History:  Diagnosis Date  . Adenomatous polyp   . Cancer [facility]) 2003   prostate  . Hemorrhoids   . Hypercholesteremia   . Hypertension   . Muscular degeneration   . Nephrolithiasis   . Obesity   . Polycythemia   . Shingles   . Sleep apnea     Past Surgical History:  Procedure Laterality Date  . CHOLECYSTECTOMY    . IR ANGIO EXTERNAL CAROTID SEL EXT CAROTID UNI R MOD SED  07/10/2017  . IR ANGIO INTRA EXTRACRAN SEL INTERNAL CAROTID BILAT MOD SED  07/10/2017  . IR ANGIO VERTEBRAL SEL VERTEBRAL BILAT MOD SED  07/10/2017  . LEFT HEART CATHETERIZATION WITH CORONARY ANGIOGRAM N/A 03/15/2013   Procedure: LEFT HEART CATHETERIZATION WITH CORONARY ANGIOGRAM;  Surgeon: Minus Breeding, MD;  Location: [facility] CATH LAB;  Service: Cardiovascular;  Laterality: N/A;  . PROSTATECTOMY    . STONE EXTRACTION WITH BASKET      There were no vitals filed for this visit.  Subjective Assessment - 08/08/17 1321    Subjective  Pt reported no falls, no changes in medication, and no pain today. Pt states he has been walking 6-10 min walking twice a day. Also, had question about maintaining eyes open or eyes closed for HEP program.     Patient is accompained by:  Family member    Pertinent History  HTN, hyperlipidemia, adenomatous polyp, prostate CA, muscular degeneration, nephrolithiasis, obesity, polycythemia, and shingles    Patient Stated Goals  Pt would like to have less back pain, improved endurance, and increased LE strength to get off of low surfaces     Currently in Pain?  No/denies           [facility] Adult PT Treatment/Exercise - 08/08/17 1412      Ambulation/Gait   Ambulation/Gait  Yes    Ambulation/Gait Assistance  7: Independent    Ambulation Distance (Feet)  50 Feet x 3 around clinic transitions.     Assistive device  None    Gait Pattern  Step-through pattern    Ambulation Surface  Level;Indoor      High Level Balance   High Level Balance Activities  Tandem walking;Marching forwards;Marching backwards;Side stepping;Other (comment) Tandem walking fwd and retro    High Level Balance Comments  All high level balance exercises performed at parallel bars. Each walking task was performed 3 laps each way down and back at the parallel bars. Each task pt began with bilateral UE support and then progressed to single UE support, and then to 2 finger support only.  Exercises   Exercises  Lumbar;Knee/Hip      Lumbar Exercises: Seated   LAQ on Chair Weights (lbs)  with 2# cuff weights, x 15 reps bilaterally.       Knee/Hip Exercises: Standing   Forward Step Up  10 reps;Step Height: 6"    Forward Step Up Limitations  Used single UE support.     Step Down  10 reps;Step Height: 6"    Step Down Limitations  Used single UE support.     Wall Squat  5 seconds;10 reps    Wall Squat Limitations  verbal and demo cues provided.           Balance Exercises - 08/08/17 1420      Balance Exercises: Standing   SLS with Vectors  Foam/compliant surface;Intermittent upper extremity assist;Upper extremity assist 1;Upper extremity assist 2;30 secs On black foam with contralateral heel tap, toe tap, rev step     Rockerboard  EO;Anterior/posterior;30 seconds;EC;Intermittent UE support;UE support;Other reps (comment);Other (comment) Small rocker board, performed at parallel bars.     Step Ups  6 inch;Forward;Intermittent UE support x 15 reps bilaterally.     Tandem Gait  Retro;Forward;Intermittent upper extremity support;Upper extremity support;3 reps;Other (comment)    Sidestepping  Upper extremity support;3 reps    Overall Comments  Other (comment)      Balance Exercises: Standing   SLS with Vectors Limitations  Pt had difficulty with maintaining upright balance while standing on black foam with less UE support. Performed bilateral SLS with contralateral LE toe tap, heel tap, and retro toe taps.     Rebounder Limitations  Rockerboard exercises: Standing with open stance EO 30 sec x 2 reps, open stance EC 30 sec x 2 reps, and anterior/posterior ankle strategy weight shifting x 15 reps each with 3 sec holds each direction. Pt began each task with B UE support and progressed to two finger support. Pt did have lateral swaying left and right, but reached for the parallel bars when he felt he was going to lose his balance.     Tandem Gait Limitations  Pt had increased difficulting with reverse tandem walking and used parallel bars during majority of task to feel safe. Verbal and demo cues for sequence and pacing of task help for controlled motions of task.     Sidestepping Limitations  Pt required verbal cues to keep upright posture during side stepping at countertop x 3 laps back and forth. Used UE support intermediately.         PT Education - 08/08/17 1906    Education provided  Yes    Education Details  verbally reviewed with handouts pt's HEP, specifically when EO or EC. also advised to hold chair as needed for balance.     Person(s) Educated  Patient    Methods  Explanation;Verbal cues;Handout    Comprehension  Verbalized understanding;Verbal cues required;Need further instruction         PT Short  Term Goals - 07/28/17 1342      PT SHORT TERM GOAL #1   Title  Pt will report performing walking program, HEP and [employer] 2-3 days/week    Time  4    Period  Weeks    Status  New    Target Date  08/27/17      PT SHORT TERM GOAL #2   Title  Pt will improve functional LE strength decreasing five time sit to stand to </= 15 seconds    Baseline  18.38 seconds  with UE support    Time  4    Period  Weeks    Status  New    Target Date  08/27/17      PT SHORT TERM GOAL #3   Title  Pt will improve BERG balance to >/= 50/56    Baseline  44/56    Time  4    Period  Weeks    Status  New    Target Date  08/27/17      PT SHORT TERM GOAL #4   Title  Pt will improve functional endurance on 6 minute walk test by increasing distance by 150'    Baseline  1385 feet    Time  4    Period  Weeks    Status  New    Target Date  08/27/17        PT Long Term Goals - 07/28/17 1345      PT LONG TERM GOAL #1   Title  Pt will report performing HEP, walking program and [employer] 5/7 days a week    Time  8    Period  Weeks    Status  New    Target Date  09/26/17      PT LONG TERM GOAL #2   Title  Pt will report increase in overall function as indicated by increase in FOTO to >/= 80% by D/C    Baseline  66%    Time  8    Period  Weeks    Status  New    Target Date  09/26/17      PT LONG TERM GOAL #3   Title  Will improve five time sit to stand to </= 13 seconds    Time  8    Period  Weeks    Status  New    Target Date  09/26/17      PT LONG TERM GOAL #4   Title  Will improve BERG score to >/= 53/56 to lower falls risk    Time  8    Period  Weeks    Status  New    Target Date  09/26/17      PT LONG TERM GOAL #5   Title  Will improve 6 minute walk test distance by 250' (from eval)    Baseline  1385 at eval    Time  8    Period  Weeks    Status  New    Target Date  09/26/17            Plan - 08/08/17 1647    Clinical Impression Statement  Today's skilled  session focused improving dynamic balance, static balance, and LE strengthening. Pt continues to progress towards goals and is demonstrating compliance and understanding of HEP program. Pt would benefit from continued skilled PT sessions towards progress of unmet goals.     History and Personal Factors relevant to plan of care:  independent, driving and working out 7-3 days a week prior to CVA, HTN, hyperlipdemia, adenmatous polyp, prostate CA, muscular degeneration, nephrolithiasis, obesity, polycythemia, and shingles, goal is to keep SBP<160    Clinical Presentation  Stable    Clinical Presentation due to:  independent, driving and working out 7-3 days a week prior to CVA, HTN, hyperlipdemia, adenmatous polyp, prostate CA, muscular degeneration, nephrolithiasis, obesity, polycythemia, and shingles, goal is to keep SBP<160    Clinical Decision Making  Low    Rehab Potential  Good  PT Frequency  Other (comment)    PT Duration  8 weeks    PT Treatment/Interventions  ADLs/Self Care Home Management;Gait training;Stair training;Functional mobility training;Therapeutic activities;Therapeutic exercise;Balance training;Neuromuscular re-education;Patient/family education;Energy conservation    PT Next Visit Plan  Continue to LE strengthening, static balance on rockerboard or airex pad, and high level balance activiites- balance board, stepping strategies     Consulted and Agree with Plan of Care  Patient;Family member/caregiver    Family Member Consulted  wife       Patient will benefit from skilled therapeutic intervention in order to improve the following deficits and impairments:  Decreased activity tolerance, Decreased balance, Decreased endurance, Decreased strength  Visit Diagnosis: Hemiplegia and hemiparesis following nontraumatic intracerebral hemorrhage affecting left non-dominant side (HCC)  Unsteadiness on feet  Muscle weakness (generalized)     Problem List Patient Active Problem  List   Diagnosis Date Noted  . Cerebral venous thrombosis 07/13/2017  . Intracerebral hemorrhage 07/10/2017  . ICH (intracerebral hemorrhage) ([place]) 07/10/2017  . Unstable angina (HCC) 03/12/2013    Carlena Sax 08/08/2017, 5:00 PM  [facility] [address] [place], [place], [address] Phone: [phone]   Fax:  [phone]  Name: Justin Adams MRN: [id number] Date of Birth: March 19, 1946

## 2017-08-11 ENCOUNTER — Telehealth: Payer: Self-pay | Admitting: Physical Therapy

## 2017-08-11 NOTE — Telephone Encounter (Signed)
Thank you for the message. Yes, I remember him. He had right side venous sinus thrombosis with ICH. When he was hospitalized, we started anticoagulation for him, due to [facility], we treated his BP aggressively. Now it has been 4 weeks out, if he clinically is doing well, I really do not have much restriction for him with PT/OT. But he may be too high functioning for PT/OT. Thanks.   Rosalin Hawking, MD PhD Stroke Neurology 08/11/2017 5:43 PM

## 2017-08-11 NOTE — Telephone Encounter (Signed)
Dr. Erlinda Hong,   We are currently working with this gentleman for PT at [facility]. He is wanting to get back into the gym, however he is unclear on any restrictions you placed on him at the hospital. Do you recall them? He was doing cardio equipment and weight machines. Would those still be safe for him if modified to lower weights/resistances? Thank you in advance.  Willow Ora, PTA, [facility] [address] [place], [place] [address] [phone] 08/11/17, 10:54 AM

## 2017-08-12 ENCOUNTER — Ambulatory Visit: Payer: Medicare Other

## 2017-08-12 DIAGNOSIS — R29818 Other symptoms and signs involving the nervous system: Secondary | ICD-10-CM | POA: Diagnosis not present

## 2017-08-12 DIAGNOSIS — R2681 Unsteadiness on feet: Secondary | ICD-10-CM

## 2017-08-12 DIAGNOSIS — I69154 Hemiplegia and hemiparesis following nontraumatic intracerebral hemorrhage affecting left non-dominant side: Secondary | ICD-10-CM | POA: Diagnosis not present

## 2017-08-12 DIAGNOSIS — M6281 Muscle weakness (generalized): Secondary | ICD-10-CM

## 2017-08-12 NOTE — Therapy (Signed)
[facility] [address] [place], [place], [address] Phone: [phone]   Fax:  [phone]  Physical Therapy Treatment  Patient Details  Name: Justin Adams MRN: [id number] Date of Birth: [birth year] Referring Provider: Rosalin Hawking, MD   Encounter Date: 08/12/2017  PT End of Session - 08/12/17 1100    Visit Number  4    Number of Visits  13    Date for PT Re-Evaluation  09/26/17    Authorization Type  Medicare and [employer], Plan F, covered 100%    PT Start Time  1015    PT Stop Time  1056    PT Time Calculation (min)  41 min    Equipment Utilized During Treatment  Gait belt    Activity Tolerance  Patient tolerated treatment well pt did require rest breaks 2/2 LBP.    Behavior During Therapy  [facility] for tasks assessed/performed       Past Medical History:  Diagnosis Date  . Adenomatous polyp   . Cancer [facility]) 2003   prostate  . Hemorrhoids   . Hypercholesteremia   . Hypertension   . Muscular degeneration   . Nephrolithiasis   . Obesity   . Polycythemia   . Shingles   . Sleep apnea     Past Surgical History:  Procedure Laterality Date  . CHOLECYSTECTOMY    . IR ANGIO EXTERNAL CAROTID SEL EXT CAROTID UNI R MOD SED  07/10/2017  . IR ANGIO INTRA EXTRACRAN SEL INTERNAL CAROTID BILAT MOD SED  07/10/2017  . IR ANGIO VERTEBRAL SEL VERTEBRAL BILAT MOD SED  07/10/2017  . LEFT HEART CATHETERIZATION WITH CORONARY ANGIOGRAM N/A 03/15/2013   Procedure: LEFT HEART CATHETERIZATION WITH CORONARY ANGIOGRAM;  Surgeon: Minus Breeding, MD;  Location: [facility] CATH LAB;  Service: Cardiovascular;  Laterality: N/A;  . PROSTATECTOMY    . STONE EXTRACTION WITH BASKET      There were no vitals filed for this visit.  Subjective Assessment - 08/12/17 1017    Subjective  Pt denied falls since last visit. Pt reported his low back is sore today, after riding in car for hours yesterday, helping wife with mobile meals.     Pertinent History   HTN, hyperlipidemia, adenomatous polyp, prostate CA, muscular degeneration, nephrolithiasis, obesity, polycythemia, and shingles    Patient Stated Goals  Pt would like to have less back pain, improved endurance, and increased LE strength to get off of low surfaces     Currently in Pain?  Yes    Pain Score  -- 1-2/10    Pain Location  Back    Pain Orientation  Lower more to the R side    Pain Descriptors / Indicators  Aching    Pain Type  Chronic pain    Pain Onset  More than a month ago    Pain Frequency  Intermittent    Aggravating Factors   doing stuff    Pain Relieving Factors  resting (seated)                            Balance Exercises - 08/12/17 1020      Balance Exercises: Standing   Rockerboard  EO;Anterior/posterior;30 seconds;EC;Intermittent UE support;UE support;Other (comment);Lateral;Head turns;10 seconds;10 reps    Sidestepping  Upper extremity support;Foam/compliant support;Other (comment);4 reps 4x10' with and without UE support.    Other Standing Exercises  Pt also performed tandem walking in //bars with intermittent UE support 2x10 reps  with cues to improve weight shifting forward. All activities performed in // bars with intermittent UE support and cue/demo for technique. Pt required incr. cues to sidestep and lateral weight shift to complete tasks. 3 seated rest breaks 2/2 LBP. Performed with min guard to min A.           PT Short Term Goals - 07/28/17 1342      PT SHORT TERM GOAL #1   Title  Pt will report performing walking program, HEP and [employer] 2-3 days/week    Time  4    Period  Weeks    Status  New    Target Date  08/27/17      PT SHORT TERM GOAL #2   Title  Pt will improve functional LE strength decreasing five time sit to stand to </= 15 seconds    Baseline  18.38 seconds with UE support    Time  4    Period  Weeks    Status  New    Target Date  08/27/17      PT SHORT TERM GOAL #3   Title  Pt will improve BERG  balance to >/= 50/56    Baseline  44/56    Time  4    Period  Weeks    Status  New    Target Date  08/27/17      PT SHORT TERM GOAL #4   Title  Pt will improve functional endurance on 6 minute walk test by increasing distance by 150'    Baseline  1385 feet    Time  4    Period  Weeks    Status  New    Target Date  08/27/17        PT Long Term Goals - 07/28/17 1345      PT LONG TERM GOAL #1   Title  Pt will report performing HEP, walking program and [employer] 5/7 days a week    Time  8    Period  Weeks    Status  New    Target Date  09/26/17      PT LONG TERM GOAL #2   Title  Pt will report increase in overall function as indicated by increase in FOTO to >/= 80% by D/C    Baseline  66%    Time  8    Period  Weeks    Status  New    Target Date  09/26/17      PT LONG TERM GOAL #3   Title  Will improve five time sit to stand to </= 13 seconds    Time  8    Period  Weeks    Status  New    Target Date  09/26/17      PT LONG TERM GOAL #4   Title  Will improve BERG score to >/= 53/56 to lower falls risk    Time  8    Period  Weeks    Status  New    Target Date  09/26/17      PT LONG TERM GOAL #5   Title  Will improve 6 minute walk test distance by 250' (from eval)    Baseline  1385 at eval    Time  8    Period  Weeks    Status  New    Target Date  09/26/17            Plan - 08/12/17 1100  Clinical Impression Statement  Pt demonstrated progress, as he progressed to no UE support during high level balance activities on compliant surfaces. Pt continues to require cues for weight shifting and did require 3 seated rest breaks 3/3 LBP. Pt would continue to benefit from skilled PT to improve safety during functional mobility.     Rehab Potential  Good    PT Frequency  Other (comment)    PT Duration  8 weeks    PT Treatment/Interventions  ADLs/Self Care Home Management;Gait training;Stair training;Functional mobility training;Therapeutic  activities;Therapeutic exercise;Balance training;Neuromuscular re-education;Patient/family education;Energy conservation    PT Next Visit Plan  Continue to LE strengthening, static balance on rockerboard or airex pad, and high level balance activiites- balance board, stepping strategies     Consulted and Agree with Plan of Care  Patient;Family member/caregiver    Family Member Consulted  wife       Patient will benefit from skilled therapeutic intervention in order to improve the following deficits and impairments:  Decreased activity tolerance, Decreased balance, Decreased endurance, Decreased strength  Visit Diagnosis: Unsteadiness on feet  Muscle weakness (generalized)     Problem List Patient Active Problem List   Diagnosis Date Noted  . Cerebral venous thrombosis 07/13/2017  . Intracerebral hemorrhage 07/10/2017  . ICH (intracerebral hemorrhage) ([place]) 07/10/2017  . Unstable angina ([place]) 03/12/2013    Miller,Jennifer L 08/12/2017, 11:03 AM  [facility] [address] [place], [place], [address] Phone: [phone]   Fax:  [phone]  Name: Justin Adams MRN: [id number] Date of Birth: [birth year]  Geoffry Paradise, PT,DPT 08/12/17 11:03 AM Phone: [phone] Fax: [phone]

## 2017-08-13 ENCOUNTER — Telehealth (HOSPITAL_COMMUNITY): Payer: Self-pay

## 2017-08-13 NOTE — Telephone Encounter (Signed)
Called pt to schedule f/u angio. Pt wants to talk with Dr. Erlinda Hong first and see what he recommends. He will call me back after visit. AW

## 2017-08-14 ENCOUNTER — Ambulatory Visit: Payer: Medicare Other | Admitting: Rehabilitation

## 2017-08-14 ENCOUNTER — Encounter: Payer: Self-pay | Admitting: Rehabilitation

## 2017-08-14 ENCOUNTER — Encounter: Payer: Self-pay | Admitting: Neurology

## 2017-08-14 DIAGNOSIS — M6281 Muscle weakness (generalized): Secondary | ICD-10-CM | POA: Diagnosis not present

## 2017-08-14 DIAGNOSIS — I69154 Hemiplegia and hemiparesis following nontraumatic intracerebral hemorrhage affecting left non-dominant side: Secondary | ICD-10-CM | POA: Diagnosis not present

## 2017-08-14 DIAGNOSIS — R29818 Other symptoms and signs involving the nervous system: Secondary | ICD-10-CM | POA: Diagnosis not present

## 2017-08-14 DIAGNOSIS — R2681 Unsteadiness on feet: Secondary | ICD-10-CM | POA: Diagnosis not present

## 2017-08-14 NOTE — Therapy (Signed)
[facility] [address] [place], [place], [address] Phone: [phone]   Fax:  [phone]  Physical Therapy Treatment  Patient Details  Name: Justin Adams MRN: [id number] Date of Birth: [birth year] Referring Provider: Rosalin Hawking, MD   Encounter Date: 08/14/2017  PT End of Session - 08/14/17 0847    Visit Number  5    Number of Visits  13    Date for PT Re-Evaluation  09/26/17    Authorization Type  Medicare and [employer], Plan F, covered 100%    PT Start Time  0845    PT Stop Time  0931    PT Time Calculation (min)  46 min    Equipment Utilized During Treatment  Gait belt    Activity Tolerance  Patient tolerated treatment well pt did require rest breaks 2/2 LBP.    Behavior During Therapy  [facility] for tasks assessed/performed       Past Medical History:  Diagnosis Date  . Adenomatous polyp   . Cancer [facility]) 2003   prostate  . Hemorrhoids   . Hypercholesteremia   . Hypertension   . Muscular degeneration   . Nephrolithiasis   . Obesity   . Polycythemia   . Shingles   . Sleep apnea     Past Surgical History:  Procedure Laterality Date  . CHOLECYSTECTOMY    . IR ANGIO EXTERNAL CAROTID SEL EXT CAROTID UNI R MOD SED  07/10/2017  . IR ANGIO INTRA EXTRACRAN SEL INTERNAL CAROTID BILAT MOD SED  07/10/2017  . IR ANGIO VERTEBRAL SEL VERTEBRAL BILAT MOD SED  07/10/2017  . LEFT HEART CATHETERIZATION WITH CORONARY ANGIOGRAM N/A 03/15/2013   Procedure: LEFT HEART CATHETERIZATION WITH CORONARY ANGIOGRAM;  Surgeon: Minus Breeding, MD;  Location: [facility] CATH LAB;  Service: Cardiovascular;  Laterality: N/A;  . PROSTATECTOMY    . STONE EXTRACTION WITH BASKET      There were no vitals filed for this visit.  Subjective Assessment - 08/14/17 0845    Subjective  Pt reports no changes since last visit, no falls.     Pertinent History  HTN, hyperlipidemia, adenomatous polyp, prostate CA, muscular degeneration, nephrolithiasis,  obesity, polycythemia, and shingles    Patient Stated Goals  Pt would like to have less back pain, improved endurance, and increased LE strength to get off of low surfaces     Currently in Pain?  No/denies                      [facility] Adult PT Treatment/Exercise - 08/14/17 7782      Neuro Re-ed    Neuro Re-ed Details   In // bars, standing on small rocker board vertically feet apart maintaining balance x 30 secs without UE support>feet apart EC x 30 secs without UE support, feet apart EO with head turns up/down x 10 reps without UE support.  This seemed much easier, therefore transitioned to BOSU (black top up) with feet apart maintaining balance with feet apart EO x 30 secs w/o UE support, feet apart performing mini squats x 10 rep.  Keeping single leg in stance on BOSU advancing and retrostepping x 10 reps on each side.  Ended in // bars by placing single leg in stance on BOSU while advancing opposite LE to chair seat and back to floor x 10 reps on each side with BUE>single UE>light UE support with cues for improved upright posture and forward hip protraction in stance.  Cone taps while standing  on blue therapy mat alternating LEs x 7 reps (2 sets) progressing to tipping cone over and upright (same reps/sets) to increase time spent in SLS.  Tolerated well with only min/guard needed to steady at times.        Exercises   Exercises  Other Exercises    Other Exercises   Quadruped alternating LE extension x 10 reps on each side>alternating UE/LE extension x 5 reps on each side.  Posterior pelvic tilt x 10 reps with hand at back for feedback.  BLE bridging x 10 reps.  Provided for HEP.              PT Education - 08/14/17 0847    Education provided  Yes    Education Details  discussed options of using shower chair or long handled sponge to avoid bending in shower as this increases pain.     Person(s) Educated  Patient    Methods  Explanation    Comprehension  Verbalized  understanding       PT Short Term Goals - 07/28/17 1342      PT SHORT TERM GOAL #1   Title  Pt will report performing walking program, HEP and [employer] 2-3 days/week    Time  4    Period  Weeks    Status  New    Target Date  08/27/17      PT SHORT TERM GOAL #2   Title  Pt will improve functional LE strength decreasing five time sit to stand to </= 15 seconds    Baseline  18.38 seconds with UE support    Time  4    Period  Weeks    Status  New    Target Date  08/27/17      PT SHORT TERM GOAL #3   Title  Pt will improve BERG balance to >/= 50/56    Baseline  44/56    Time  4    Period  Weeks    Status  New    Target Date  08/27/17      PT SHORT TERM GOAL #4   Title  Pt will improve functional endurance on 6 minute walk test by increasing distance by 150'    Baseline  1385 feet    Time  4    Period  Weeks    Status  New    Target Date  08/27/17        PT Long Term Goals - 07/28/17 1345      PT LONG TERM GOAL #1   Title  Pt will report performing HEP, walking program and [employer] 5/7 days a week    Time  8    Period  Weeks    Status  New    Target Date  09/26/17      PT LONG TERM GOAL #2   Title  Pt will report increase in overall function as indicated by increase in FOTO to >/= 80% by D/C    Baseline  66%    Time  8    Period  Weeks    Status  New    Target Date  09/26/17      PT LONG TERM GOAL #3   Title  Will improve five time sit to stand to </= 13 seconds    Time  8    Period  Weeks    Status  New    Target Date  09/26/17      PT  LONG TERM GOAL #4   Title  Will improve BERG score to >/= 53/56 to lower falls risk    Time  8    Period  Weeks    Status  New    Target Date  09/26/17      PT LONG TERM GOAL #5   Title  Will improve 6 minute walk test distance by 250' (from eval)    Baseline  1385 at eval    Time  8    Period  Weeks    Status  New    Target Date  09/26/17            Plan - 08/14/17 4818    Clinical  Impression Statement  Skilled session continues to focus on high level balance with SLS, compliant surface with EO and EC.  Pt making excellent progress and needs only min cues today for forward weight shift.  Only required single rest break for LBP today.  Also provided pt with core strengthening HEP during session to assist with LBP.      Rehab Potential  Good    PT Frequency  Other (comment)    PT Duration  8 weeks    PT Treatment/Interventions  ADLs/Self Care Home Management;Gait training;Stair training;Functional mobility training;Therapeutic activities;Therapeutic exercise;Balance training;Neuromuscular re-education;Patient/family education;Energy conservation    PT Next Visit Plan  Continue to LE strengthening, static balance on rockerboard or airex pad, and high level balance activiites- balance board, stepping strategies     Consulted and Agree with Plan of Care  Patient;Family member/caregiver    Family Member Consulted  wife       Patient will benefit from skilled therapeutic intervention in order to improve the following deficits and impairments:  Decreased activity tolerance, Decreased balance, Decreased endurance, Decreased strength  Visit Diagnosis: Unsteadiness on feet  Muscle weakness (generalized)  Hemiplegia and hemiparesis following nontraumatic intracerebral hemorrhage affecting left non-dominant side [facility])     Problem List Patient Active Problem List   Diagnosis Date Noted  . Cerebral venous thrombosis 07/13/2017  . Intracerebral hemorrhage 07/10/2017  . ICH (intracerebral hemorrhage) ([place]) 07/10/2017  . Unstable angina ([place]) 03/12/2013    Cameron Sprang, PT, MPT [facility] [address] [place], [place], [address] Phone: [phone]   Fax:  [phone] 08/14/17, 9:54 AM  Name: Justin Adams MRN: [id number] Date of Birth: 05/11/1946

## 2017-08-14 NOTE — Patient Instructions (Addendum)
Quadruped: Hip Extension / Knee Extension (Active)    On hands and knees, lift right leg with knee straight.  Complete _1__ sets of _10__ repetitions. Perform _1-2__ sessions per day.  Copyright  VHI. All rights reserved.   Upper / Lower Extremity Extension (All-Fours)    Tighten stomach and raise right leg and opposite arm. Keep trunk rigid. Repeat _5___ times per set. Do __1-2__ sets per session. Do __1-2__ sessions per day.  http://orth.exer.us/1118   Copyright  VHI. All rights reserved.   PELVIC TILT: Posterior    Tighten abdominals, flatten low back. _10__ reps per set (hold each for 5 secs), _1-2__ sets per day, _5-7__ days per week   Copyright  VHI. All rights reserved.   Bridging    Slowly raise buttocks from floor, keeping stomach tight. Repeat _10___ times per set slowly. Do __1__ sets per session. Do _1-2___ sessions per day.  http://orth.exer.us/1096   Copyright  VHI. All rights reserved.

## 2017-08-18 DIAGNOSIS — R310 Gross hematuria: Secondary | ICD-10-CM | POA: Diagnosis not present

## 2017-08-18 DIAGNOSIS — R3121 Asymptomatic microscopic hematuria: Secondary | ICD-10-CM | POA: Diagnosis not present

## 2017-08-18 DIAGNOSIS — N200 Calculus of kidney: Secondary | ICD-10-CM | POA: Diagnosis not present

## 2017-08-18 DIAGNOSIS — C61 Malignant neoplasm of prostate: Secondary | ICD-10-CM | POA: Diagnosis not present

## 2017-08-18 DIAGNOSIS — N393 Stress incontinence (female) (male): Secondary | ICD-10-CM | POA: Diagnosis not present

## 2017-08-19 ENCOUNTER — Ambulatory Visit: Payer: Medicare Other | Admitting: Physical Therapy

## 2017-08-19 ENCOUNTER — Encounter: Payer: Self-pay | Admitting: Physical Therapy

## 2017-08-19 DIAGNOSIS — M6281 Muscle weakness (generalized): Secondary | ICD-10-CM

## 2017-08-19 DIAGNOSIS — R29818 Other symptoms and signs involving the nervous system: Secondary | ICD-10-CM | POA: Diagnosis not present

## 2017-08-19 DIAGNOSIS — I69154 Hemiplegia and hemiparesis following nontraumatic intracerebral hemorrhage affecting left non-dominant side: Secondary | ICD-10-CM | POA: Diagnosis not present

## 2017-08-19 DIAGNOSIS — R2681 Unsteadiness on feet: Secondary | ICD-10-CM | POA: Diagnosis not present

## 2017-08-19 NOTE — Therapy (Signed)
[facility] [address] [place], [place], [address] Phone: [phone]   Fax:  [phone]  Physical Therapy Treatment  Patient Details  Name: Justin Adams MRN: [id number] Date of Birth: [birth year] Referring Provider: Rosalin Hawking, MD   Encounter Date: 08/19/2017  PT End of Session - 08/19/17 1245    Visit Number  6    Number of Visits  13    Date for PT Re-Evaluation  09/26/17    Authorization Type  Medicare and AARP, Plan F, covered 100%    PT Start Time  0930    PT Stop Time  1013    PT Time Calculation (min)  43 min    Equipment Utilized During Treatment  Gait belt    Activity Tolerance  Patient tolerated treatment well    Behavior During Therapy  WFL for tasks assessed/performed       Past Medical History:  Diagnosis Date  . Adenomatous polyp   . Cancer [facility]) 2003   prostate  . Hemorrhoids   . Hypercholesteremia   . Hypertension   . Muscular degeneration   . Nephrolithiasis   . Obesity   . Polycythemia   . Shingles   . Sleep apnea     Past Surgical History:  Procedure Laterality Date  . CHOLECYSTECTOMY    . IR ANGIO EXTERNAL CAROTID SEL EXT CAROTID UNI R MOD SED  07/10/2017  . IR ANGIO INTRA EXTRACRAN SEL INTERNAL CAROTID BILAT MOD SED  07/10/2017  . IR ANGIO VERTEBRAL SEL VERTEBRAL BILAT MOD SED  07/10/2017  . LEFT HEART CATHETERIZATION WITH CORONARY ANGIOGRAM N/A 03/15/2013   Procedure: LEFT HEART CATHETERIZATION WITH CORONARY ANGIOGRAM;  Surgeon: Minus Breeding, MD;  Location: [facility] CATH LAB;  Service: Cardiovascular;  Laterality: N/A;  . PROSTATECTOMY    . STONE EXTRACTION WITH BASKET      There were no vitals filed for this visit.  Subjective Assessment - 08/19/17 0931    Subjective  Pt reports no changes since last visit and no falls. Pt does report low back pain. Pt stated, he hasn't been cleared by his doctor to workout yet, but has an appointment coming up in which he will be asking  questions about it.     Pertinent History  HTN, hyperlipidemia, adenomatous polyp, prostate CA, muscular degeneration, nephrolithiasis, obesity, polycythemia, and shingles    Patient Stated Goals  Pt would like to have less back pain, improved endurance, and increased LE strength to get off of low surfaces     Pain Score  3     Pain Location  Back    Pain Orientation  Lower    Pain Descriptors / Indicators  Aching    Pain Type  Chronic pain    Pain Onset  More than a month ago    Pain Frequency  Intermittent    Aggravating Factors   showering in the mornings, and being active during the day     Pain Relieving Factors  sitting rest breaks for 5 mins          [facility] Adult PT Treatment/Exercise - 08/19/17 1227      Ambulation/Gait   Ambulation/Gait  Yes    Ambulation/Gait Assistance  7: Independent    Ambulation Distance (Feet)  50 Feet x 3 times around clinic.    Assistive device  None    Gait Pattern  Step-through pattern    Ambulation Surface  Level;Indoor    Stairs  --  High Level Balance   High Level Balance Activities  Tandem walking;Marching forwards;Marching backwards;Side stepping;Other (comment);Backward walking Tandem walking forward and backwards.    High Level Balance Comments  Hgh level balance exercises performed at beside countertop to provide UE support as needed. Pt standing on red and blue mat for added balance challenge to LE strengthening exercises. Each walking task was performed 3 laps each way down and back at countertop. Each task pt began with single UE support and then progressed to an occasional 2 finger support as needed.       Knee/Hip Exercises: Standing   Lateral Step Up  15 reps;Right;Left;Both;1 set;Step Height: 6";Other (comment) Single UE support; cues for upright posture    Forward Step Up  15 reps;Step Height: 6";1 set bilateral    Forward Step Up Limitations  Used single UE support.     Step Down  Step Height: 6";15 reps    Step Down  Limitations  Used single UE support. Required initial verbal and demo cues for stepping down sequence.         Balance Exercises - 08/19/17 1234      Balance Exercises: Standing   Standing Eyes Opened  Narrow base of support (BOS);Head turns;Wide (BOA);Foam/compliant surface;2 reps;30 secs    Standing Eyes Closed  Narrow base of support (BOS);Head turns;Wide (BOA);Foam/compliant surface;30 secs;2 reps    Tandem Stance  Eyes open;Eyes closed;Foam/compliant surface;30 secs;2 reps    Rockerboard  EO;Anterior/posterior;30 seconds;EC;Intermittent UE support;UE support;Other (comment);Lateral;Head turns;Other reps (comment) Used medium rockerboard, at parallel bars       Balance Exercises: Standing   Standing Eyes Closed Limitations  While standing on airex pad, pt performed EC with open wide stance, EC tandem bilateral, EC narrow BOS, EC open stance with head turns up/down, left/right for 2 reps of 30 seconds each. Pt had swaying laterally left and right during EC and tandem stance; during EC and head turning up and down, pt had a posterior lean whiich caused a steppage strategy to regain balance. SPTA provided supervision to min assistance, and chair in front of patient, and gait belt for safety.     Rebounder Limitations  Rockerboard exercises: Standing with open stance EO 30 sec x 2 reps, open stance EC 30 sec x 2 reps, and anterior/posterior ankle strategy weight shifting 2 x 10  reps each with 3 sec holds each direction. Pt began each task with single UE support and progressed to occasional two finger support. Pt with posterior swaying mostly when trying to correct COG to have upright posture with EC. Pt reached for the parallel bars when he felt he was going to lose his balance.  Repositioned board; Added weight shifting left to right x 10 reps for 3 sec hold each; EC open stance, EC with HT turns up/down and left/right.          PT Short Term Goals - 07/28/17 1342      PT SHORT TERM GOAL #1    Title  Pt will report performing walking program, HEP and [employer] 2-3 days/week    Time  4    Period  Weeks    Status  New    Target Date  08/27/17      PT SHORT TERM GOAL #2   Title  Pt will improve functional LE strength decreasing five time sit to stand to </= 15 seconds    Baseline  18.38 seconds with UE support    Time  4    Period  Weeks    Status  New    Target Date  08/27/17      PT SHORT TERM GOAL #3   Title  Pt will improve BERG balance to >/= 50/56    Baseline  44/56    Time  4    Period  Weeks    Status  New    Target Date  08/27/17      PT SHORT TERM GOAL #4   Title  Pt will improve functional endurance on 6 minute walk test by increasing distance by 150'    Baseline  1385 feet    Time  4    Period  Weeks    Status  New    Target Date  08/27/17        PT Long Term Goals - 07/28/17 1345      PT LONG TERM GOAL #1   Title  Pt will report performing HEP, walking program and [employer] 5/7 days a week    Time  8    Period  Weeks    Status  New    Target Date  09/26/17      PT LONG TERM GOAL #2   Title  Pt will report increase in overall function as indicated by increase in FOTO to >/= 80% by D/C    Baseline  66%    Time  8    Period  Weeks    Status  New    Target Date  09/26/17      PT LONG TERM GOAL #3   Title  Will improve five time sit to stand to </= 13 seconds    Time  8    Period  Weeks    Status  New    Target Date  09/26/17      PT LONG TERM GOAL #4   Title  Will improve BERG score to >/= 53/56 to lower falls risk    Time  8    Period  Weeks    Status  New    Target Date  09/26/17      PT LONG TERM GOAL #5   Title  Will improve 6 minute walk test distance by 250' (from eval)    Baseline  1385 at eval    Time  8    Period  Weeks    Status  New    Target Date  09/26/17        Plan - 08/19/17 1246    Clinical Impression Statement  Today's skilled session focused on high level balance exercises on compliant  surfaces/uneven surfaces, bilateral LE strengthening, and static balance with EC with head turns. Pt required two sitting rest breaks due to fatigue and low back pain, which was maintained at baseline level (3/10 pain) throughout session.  Pt continues to make progress towards STG's goals. Pt would benefit from continued skilled PT interventions towards unmet goals.      History and Personal Factors relevant to plan of care:  independent, driving and working out 7-3 days a week prior to CVA, HTn, hyperlipdemia, adenmatous polyp, prostate CA, muscular degeneration, nephrolithiasis, obesity, polycythemia, and shingles, goal is to keep SBP <160    Clinical Presentation  Stable    Clinical Presentation due to:  independent, driving and working out 7-3 days a week prior to CVA, HTn, hyperlipdemia, adenmatous polyp, prostate CA, muscular degeneration, nephrolithiasis, obesity, polycythemia, and shingles, goal is to keep SBP <160    Clinical Decision Making  Low    Rehab Potential  Good    PT Frequency  Other (comment)    PT Duration  8 weeks    PT Treatment/Interventions  ADLs/Self Care Home Management;Gait training;Stair training;Functional mobility training;Therapeutic activities;Therapeutic exercise;Balance training;Neuromuscular re-education;Patient/family education;Energy conservation    PT Next Visit Plan  Continue to make progress in dynamic balance, gait distance, and LE strengthening. Check STG's 08/27/17    Consulted and Agree with Plan of Care  Patient       Patient will benefit from skilled therapeutic intervention in order to improve the following deficits and impairments:  Decreased activity tolerance, Decreased balance, Decreased endurance, Decreased strength  Visit Diagnosis: Unsteadiness on feet  Muscle weakness (generalized)  Hemiplegia and hemiparesis following nontraumatic intracerebral hemorrhage affecting left non-dominant side [facility])     Problem List Patient Active Problem  List   Diagnosis Date Noted  . Cerebral venous thrombosis 07/13/2017  . Intracerebral hemorrhage 07/10/2017  . ICH (intracerebral hemorrhage) ([place]) 07/10/2017  . Unstable angina ([place]) 03/12/2013    Carlena Sax, SPTA 08/19/2017, 1:04 PM  [facility] [address] [place], [place], [address] Phone: [phone]   Fax:  [phone]  Name: HARLEN DANFORD MRN: [id number] Date of Birth: [birth year]

## 2017-08-21 ENCOUNTER — Ambulatory Visit: Payer: Medicare Other | Admitting: Physical Therapy

## 2017-08-21 ENCOUNTER — Encounter: Payer: Self-pay | Admitting: Physical Therapy

## 2017-08-21 DIAGNOSIS — I69154 Hemiplegia and hemiparesis following nontraumatic intracerebral hemorrhage affecting left non-dominant side: Secondary | ICD-10-CM

## 2017-08-21 DIAGNOSIS — R2681 Unsteadiness on feet: Secondary | ICD-10-CM

## 2017-08-21 DIAGNOSIS — M6281 Muscle weakness (generalized): Secondary | ICD-10-CM | POA: Diagnosis not present

## 2017-08-21 DIAGNOSIS — R29818 Other symptoms and signs involving the nervous system: Secondary | ICD-10-CM | POA: Diagnosis not present

## 2017-08-21 NOTE — Therapy (Signed)
[facility] [address] [place], [place], [address] Phone: [phone]   Fax:  [phone]  Physical Therapy Treatment  Patient Details  Name: Justin Adams MRN: [id number] Date of Birth: 1945-12-07 Referring Provider: Rosalin Hawking, MD   Encounter Date: 08/21/2017  PT End of Session - 08/21/17 0927    Visit Number  7    Number of Visits  13    Date for PT Re-Evaluation  09/26/17    Authorization Type  Medicare and AARP, Plan F, covered 100%    PT Start Time  0848    PT Stop Time  0927    PT Time Calculation (min)  39 min    Activity Tolerance  Patient tolerated treatment well    Behavior During Therapy  [facility] for tasks assessed/performed       Past Medical History:  Diagnosis Date  . Adenomatous polyp   . Cancer [facility]) 2003   prostate  . Hemorrhoids   . Hypercholesteremia   . Hypertension   . Muscular degeneration   . Nephrolithiasis   . Obesity   . Polycythemia   . Shingles   . Sleep apnea     Past Surgical History:  Procedure Laterality Date  . CHOLECYSTECTOMY    . IR ANGIO EXTERNAL CAROTID SEL EXT CAROTID UNI R MOD SED  07/10/2017  . IR ANGIO INTRA EXTRACRAN SEL INTERNAL CAROTID BILAT MOD SED  07/10/2017  . IR ANGIO VERTEBRAL SEL VERTEBRAL BILAT MOD SED  07/10/2017  . LEFT HEART CATHETERIZATION WITH CORONARY ANGIOGRAM N/A 03/15/2013   Procedure: LEFT HEART CATHETERIZATION WITH CORONARY ANGIOGRAM;  Surgeon: Minus Breeding, MD;  Location: [facility] CATH LAB;  Service: Cardiovascular;  Laterality: N/A;  . PROSTATECTOMY    . STONE EXTRACTION WITH BASKET      There were no vitals filed for this visit.  Subjective Assessment - 08/21/17 0850    Subjective  Doing well; no issues with home exercises. Still planning on asking physician about returning to [employer]- elliptical, bike and weight machines.    Pertinent History  HTN, hyperlipidemia, adenomatous polyp, prostate CA, muscular degeneration,  nephrolithiasis, obesity, polycythemia, and shingles    Patient Stated Goals  Pt would like to have less back pain, improved endurance, and increased LE strength to get off of low surfaces     Currently in Pain?  No/denies    Pain Onset  More than a month ago         [facility] PT Assessment - 08/21/17 0853      6 Minute Walk- Baseline   6 Minute Walk- Baseline  yes    BP (mmHg)  130/68    HR (bpm)  76    02 Sat (%RA)  96 %    Modified Borg Scale for Dyspnea  0- Nothing at all    Perceived Rate of Exertion (Borg)  6-      6 Minute walk- Post Test   6 Minute Walk Post Test  yes    BP (mmHg)  153/69    HR (bpm)  93    02 Sat (%RA)  99 %    Modified Borg Scale for Dyspnea  0.5- Very, very slight shortness of breath    Perceived Rate of Exertion (Borg)  11- Fairly light      6 minute walk test results    Aerobic Endurance Distance Walked  1417    Endurance additional comments  distance increased by 32 feet; after 3 minute  rest pt BP returned to baseline of 126/63, 84 bpm                  [facility] Adult PT Treatment/Exercise - 08/21/17 0920      Knee/Hip Exercises: Aerobic   Elliptical  trial of elliptical (pt uses elliptical at [employer]); level 1 x 6 minutes for 0.25 miles.  BP afterwards: 165/70, HR: 108.  RPE 12-13 somewhat hard.  After 2-3 minute rest pt's BP decreased to 130/65, HR: 96             PT Education - 08/21/17 0926    Education provided  Yes    Education Details  recommendations for elliptical when cleared by MD; discussed impact of diet/sodium on BP (pt at [employer] salad at restaurant last night)    Person(s) Educated  Patient    Methods  Explanation    Comprehension  Verbalized understanding       PT Short Term Goals - 08/21/17 0908      PT SHORT TERM GOAL #1   Title  Pt will report performing walking program, HEP and [employer] 2-3 days/week    Time  4    Period  Weeks    Status  On-going    Target Date  08/27/17      PT SHORT  TERM GOAL #2   Title  Pt will improve functional LE strength decreasing five time sit to stand to </= 15 seconds    Baseline  18.38 seconds with UE support    Time  4    Period  Weeks    Status  On-going    Target Date  08/27/17      PT SHORT TERM GOAL #3   Title  Pt will improve BERG balance to >/= 50/56    Baseline  44/56    Time  4    Period  Weeks    Status  On-going    Target Date  08/27/17      PT SHORT TERM GOAL #4   Title  Pt will improve functional endurance on 6 minute walk test by increasing distance by 150'    Baseline  1385 feet; 1417 feet - increased by 32' - improved but not to goal    Time  4    Period  Weeks    Status  Not Met        PT Long Term Goals - 07/28/17 1345      PT LONG TERM GOAL #1   Title  Pt will report performing HEP, walking program and [employer] 5/7 days a week    Time  8    Period  Weeks    Status  New    Target Date  09/26/17      PT LONG TERM GOAL #2   Title  Pt will report increase in overall function as indicated by increase in FOTO to >/= 80% by D/C    Baseline  66%    Time  8    Period  Weeks    Status  New    Target Date  09/26/17      PT LONG TERM GOAL #3   Title  Will improve five time sit to stand to </= 13 seconds    Time  8    Period  Weeks    Status  New    Target Date  09/26/17      PT LONG TERM GOAL #4   Title  Will improve BERG score to >/= 53/56 to lower falls risk    Time  8    Period  Weeks    Status  New    Target Date  09/26/17      PT LONG TERM GOAL #5   Title  Will improve 6 minute walk test distance by 250' (from eval)    Baseline  1385 at eval    Time  8    Period  Weeks    Status  New    Target Date  09/26/17            Plan - 08/21/17 4315    Clinical Impression Statement  Treatment session today focused on initiating assessment of STG with re-assessment of 6 minute walk test and endurance with elliptical to assess tolerance of use of exercise machine at the gym.  Pt experienced  increase in systolic BP 40-08 points with endurance activities but recovered back to baseline after 2-3 minutes without symptoms of HA or weakness.  Advised pt, if cleared by MD to initiate exercise at [employer] to begin on elliptical for <.25 miles and work back up to 1 mile slowly.  No reports of back pain today.  Pt did not meet 6 min walk test distance but tolerance did improve with decreased SOB and lower rating of exertion.  Will continue to assess at next session.      Rehab Potential  Good    PT Frequency  Other (comment)    PT Duration  8 weeks    PT Treatment/Interventions  ADLs/Self Care Home Management;Gait training;Stair training;Functional mobility training;Therapeutic activities;Therapeutic exercise;Balance training;Neuromuscular re-education;Patient/family education;Energy conservation    PT Next Visit Plan  finish checking STG - 6 min completed.  Pt should have discussed with MD about exercise parameters - send chart to me to adjust LTG.  Thanks!    Consulted and Agree with Plan of Care  Patient       Patient will benefit from skilled therapeutic intervention in order to improve the following deficits and impairments:  Decreased activity tolerance, Decreased balance, Decreased endurance, Decreased strength  Visit Diagnosis: Unsteadiness on feet  Muscle weakness (generalized)  Hemiplegia and hemiparesis following nontraumatic intracerebral hemorrhage affecting left non-dominant side (HCC)  Other symptoms and signs involving the nervous system     Problem List Patient Active Problem List   Diagnosis Date Noted  . Cerebral venous thrombosis 07/13/2017  . Intracerebral hemorrhage 07/10/2017  . ICH (intracerebral hemorrhage) ([place]) 07/10/2017  . Unstable angina ([place]) 03/12/2013    Rico Junker, PT, DPT 08/21/17    12:33 PM    [facility] [address] [place], [place], [address] Phone: [phone]    Fax:  [phone]  Name: Justin Adams MRN: [id number] Date of Birth: 09-26-1945

## 2017-08-25 ENCOUNTER — Encounter: Payer: Self-pay | Admitting: Neurology

## 2017-08-25 NOTE — Progress Notes (Signed)
Received office visit note faxed from Dr. Diona Fanti, Urology, office. Pt was seen 08/18/17 for follow up of hematuria. Had cystoscopy which was unremarkable. UA still showing microscopic hematuria. Dr. Diona Fanti considered it was caused by large left renal calculi and is on eliquis.   Recommend from Dr. Diona Fanti that follow up in 3 months, if neuro stable, may consider uretheroscopic management of renal calculi.   Rosalin Hawking, MD PhD Stroke Neurology 08/25/2017 8:09 AM

## 2017-08-26 ENCOUNTER — Encounter: Payer: Self-pay | Admitting: Neurology

## 2017-08-26 ENCOUNTER — Ambulatory Visit: Payer: Medicare Other | Admitting: Physical Therapy

## 2017-08-26 ENCOUNTER — Ambulatory Visit (INDEPENDENT_AMBULATORY_CARE_PROVIDER_SITE_OTHER): Payer: Medicare Other | Admitting: Neurology

## 2017-08-26 ENCOUNTER — Telehealth: Payer: Self-pay | Admitting: Adult Health

## 2017-08-26 VITALS — BP 113/62 | HR 79

## 2017-08-26 VITALS — BP 92/58 | HR 81 | Wt 251.4 lb

## 2017-08-26 DIAGNOSIS — E785 Hyperlipidemia, unspecified: Secondary | ICD-10-CM | POA: Diagnosis not present

## 2017-08-26 DIAGNOSIS — G08 Intracranial and intraspinal phlebitis and thrombophlebitis: Secondary | ICD-10-CM | POA: Diagnosis not present

## 2017-08-26 DIAGNOSIS — I10 Essential (primary) hypertension: Secondary | ICD-10-CM | POA: Insufficient documentation

## 2017-08-26 DIAGNOSIS — M6281 Muscle weakness (generalized): Secondary | ICD-10-CM | POA: Diagnosis not present

## 2017-08-26 DIAGNOSIS — I69154 Hemiplegia and hemiparesis following nontraumatic intracerebral hemorrhage affecting left non-dominant side: Secondary | ICD-10-CM | POA: Diagnosis not present

## 2017-08-26 DIAGNOSIS — I611 Nontraumatic intracerebral hemorrhage in hemisphere, cortical: Secondary | ICD-10-CM | POA: Diagnosis not present

## 2017-08-26 DIAGNOSIS — I639 Cerebral infarction, unspecified: Secondary | ICD-10-CM | POA: Diagnosis not present

## 2017-08-26 DIAGNOSIS — R2681 Unsteadiness on feet: Secondary | ICD-10-CM | POA: Diagnosis not present

## 2017-08-26 DIAGNOSIS — R29818 Other symptoms and signs involving the nervous system: Secondary | ICD-10-CM | POA: Diagnosis not present

## 2017-08-26 NOTE — Patient Instructions (Signed)
Bridging    Lying supine with knees bent, tighten stomach muscles. Raise hips off floor, tighten buttocks. Hold _5__ seconds. Repeat _10__ times. Do _1__ times per day.    Bracing With March in Bridging (Hook-Lying)    With neutral spine, tighten pelvic floor and abdominals and hold. Lift bottom and hold, then march in place. March _5-10_ times. Do _1_ times a day.  Stay lifted - march each leg 1 time before going down   Bridging: with Straight Leg Raise    With legs bent, lift buttocks __8-10__ inches from floor. Then slowly extend right knee, keeping stomach tight. Repeat __5-10__ times per set. Do _1___ sets per session. Do _1___ sessions per day.  [email or web address].us/[email or web address]   Bridging with knees apart/together - 5-10 times - see picture on other sheet

## 2017-08-26 NOTE — Patient Instructions (Addendum)
-   continue eliquis and lpitor for thrombosis prevention - follow up with urologist for kidney stone management. OK to stop eliquis 48 hrs before the surgery and resume once hemodynamically stable - recommend screening colonoscopy 6 months after the discharge - will do CT and CTV to evaluate blood absorption and venous status - Follow up with your primary care physician for stroke risk factor modification. Recommend maintain blood pressure goal <130/80, diabetes with hemoglobin A1c goal below 7.0% and lipids with LDL cholesterol goal below 70 mg/dL.  - healthy diet and regular exercise  - check BP at home and record. If BP constantly, call PCP for medication adjustment.  - drink water and avoid hydration  - follow up in 3 months with Justin Adams

## 2017-08-26 NOTE — Telephone Encounter (Signed)
Medicare/aarp order sent to GI no auth require they will reach out to the patient to schedule.

## 2017-08-26 NOTE — Progress Notes (Signed)
STROKE NEUROLOGY FOLLOW UP NOTE  NAME: Justin Adams DOB: March 09, 1946  REASON FOR VISIT: stroke follow up HISTORY FROM: pt and chart  Today we had the pleasure of seeing Justin Adams in follow-up at our [facility]. Pt was accompanied by wife.   History Summary Justin Adams is a 72 y.o. male with history of prostate cancer, HTN, HLD, polycythemia, OSA admitted on 07/13/17 for right frontal headache and nausea. No tPA given due to [place]. MRI showed right temporal ICH with likely transverse sinus thrombosis. MRA showed no AVM or aneurysm. MRV confirmed right transverse sinus, sigmoid sinus and jugular vein thrombosis. Also had DSA confirmed above findings. EF 55% and pan CT no malignancy but left kidney stone with hydronephrosis. LDL 103 and A1C 5.3. He was put on IV heparin and then transitioned to lovenox and then eliquis after stabilization. HA resolved on discharge. Also lipitor 20mg  on discharge with outpt PT/OT.   Interval History During the interval time, the patient has been doing well. HA resolved. Followed with urologist due to intermittent gross hematuria.  Had cystoscopy which was unremarkable. UA still showing microscopic hematuria. Urology considered it was caused by large left renal calculi and is on eliquis. Recommend to follow up in 3 months, if neuro stable, may consider uretheroscopic management of renal calculi. pt stated that his mom had LE DVT at older age and brother had TIA in the past. BP at home 120s but today in clinic 92/58, asymptomatic.   REVIEW OF SYSTEMS: Full 14 system review of systems performed and notable only for those listed below and in HPI above, all others are negative:  Constitutional:   Cardiovascular:  Ear/Nose/Throat:   Skin:  Eyes:   Respiratory:   Gastroitestinal:   Genitourinary: blood in urine Hematology/Lymphatic:   Endocrine:  Musculoskeletal:   Allergy/Immunology:   Neurological:   Psychiatric:  Sleep:   The  following represents the patient's updated allergies and side effects list: Allergies  Allergen Reactions  . Iodinated Diagnostic Agents Rash    The neurologically relevant items on the patient's problem list were reviewed on today's visit.  Neurologic Examination  A problem focused neurological exam (12 or more points of the single system neurologic examination, vital signs counts as 1 point, cranial nerves count for 8 points) was performed.  Blood pressure (!) 92/58, pulse 81, weight 251 lb 6.4 oz (114 kg).  General - Well nourished, well developed, in no apparent distress.  Ophthalmologic - Fundi not visualized due to small pupils.  Cardiovascular - Regular rate and rhythm with no murmur.  Mental Status -  Level of arousal and orientation to time, place, and person were intact. Language including expression, naming, repetition, comprehension was assessed and found intact. Attention span and concentration were normal. Fund of Knowledge was assessed and was intact.  Cranial Nerves II - XII - II - Visual field intact OU. III, IV, VI - Extraocular movements intact. V - Facial sensation intact bilaterally. VII - Facial movement intact bilaterally. VIII - Hearing & vestibular intact bilaterally. X - Palate elevates symmetrically. XI - Chin turning & shoulder shrug intact bilaterally. XII - Tongue protrusion intact.  Motor Strength - The patient's strength was normal in all extremities and pronator drift was absent.  Bulk was normal and fasciculations were absent.   Motor Tone - Muscle tone was assessed at the neck and appendages and was normal.  Reflexes - The patient's reflexes were 1+ in all extremities and he had  no pathological reflexes.  Sensory - Light touch, temperature/pinprick, vibration and proprioception, and Romberg testing were assessed and were normal.    Coordination - The patient had normal movements in the hands and feet with no ataxia or dysmetria.  Tremor  was absent.  Gait and Station - The patient's transfers, posture, gait, station, and turns were observed as normal.   Functional score  mRS = 0   0 - No symptoms.   1 - No significant disability. Able to carry out all usual activities, despite some symptoms.   2 - Slight disability. Able to look after own affairs without assistance, but unable to carry out all previous activities.   3 - Moderate disability. Requires some help, but able to walk unassisted.   4 - Moderately severe disability. Unable to attend to own bodily needs without assistance, and unable to walk unassisted.   5 - Severe disability. Requires constant nursing care and attention, bedridden, incontinent.   6 - Dead.   NIH Stroke Scale = 0   Data reviewed: I personally reviewed the images and agree with the radiology interpretations.  Ct Head Wo Contrast  Result Date: 07/10/2017 CLINICAL DATA:  72 year old male with right temporal lobe intra-axial hemorrhage diagnosed yesterday after presentation with persistent headache. EXAM: CT HEAD WITHOUT CONTRAST TECHNIQUE: Contiguous axial images were obtained from the base of the skull through the vertex without intravenous contrast. COMPARISON:  Head CT without contrast 0152 hr today. Brain MRI 07/09/2017. FINDINGS: Brain: Mostly hyperdense intra-axial hemorrhage within the right temporal lobe centrally and involving the inferior middle and superior temporal gyri. Hemorrhage encompasses 62 by 31 by 38 millimeters (AP by transverse by CC) for an estimated blood volume of 37 milliliters -unchanged from the 07/09/2017 MRI. Surrounding edema and regional mass effect remain stable. No intraventricular extension. The small 2-3 millimeter superimposed right subdural hematoma more apparent by MRI is stable. Stable trace leftward midline shift. Basilar cisterns remain stable. Stable ventricle size and configuration. Stable gray-white matter differentiation throughout the brain. No new  cortically based infarct identified. Vascular: Asymmetric hyperdensity of the right transverse and sigmoid sinuses. Corresponding to loss of enhancement and filling defect on MRI yesterday. Calcified atherosclerosis at the skull base. Skull: Stable and negative. Sinuses/Orbits: Stable. Widespread ethmoid mucosal thickening. Bilateral maxillary sinus bubbly opacity. Tympanic cavities and mastoids remain clear. Other: Stable orbit and scalp soft tissues. IMPRESSION: 1. Right transverse and sigmoid sinus thrombosis with acute right temporal lobe intra-axial hemorrhage and small/trace right subdural hematoma. 2. Stable parenchymal hemorrhage volume since the MRI 07/09/2017, estimated at 37 mL. Stable surrounding edema and mild regional mass effect. 3. Stable trace right lateral subdural hematoma. No intraventricular hemorrhage extension or ventriculomegaly. No increasing intracranial mass effect. Stable trace leftward midline shift. 4. No new No acute intracranial abnormality. Electronically Signed   By: Genevie Ann M.D.   On: 07/10/2017 08:39   Ct Head Wo Contrast  Result Date: 07/10/2017 CLINICAL DATA:  72 y/o M; 2 weeks of headache and 2 days of nausea, suspect intracranial hemorrhage. EXAM: CT HEAD WITHOUT CONTRAST TECHNIQUE: Contiguous axial images were obtained from the base of the skull through the vertex without intravenous contrast. COMPARISON:  07/09/2017 MRI head FINDINGS: Brain: Right temporal lobe hematoma spanning 6.0 x 2.7 x 2.7 cm (volume = 23 cm^3)(AP x ML x CC series 5, image 41 and series 6, image 17). Surrounding edema and result in mass effect results in minimal right-sided uncal herniation, partial effacement of the right lateral ventricle, and  3 mm of right-to-left midline shift. There is a small volume of subarachnoid hemorrhage overlying the right temporal lobe and trace volume of hemorrhage within occipital horns of lateral ventricles likely due to redistribution. Findings are stable from prior  MRI of the brain given differences in technique. No additional acute intracranial hemorrhage, large territory infarct, or focal mass effect is identified. Background of mild chronic microvascular ischemic changes and parenchymal volume loss of the brain. Vascular: No hyperdense vessel or unexpected calcification. Skull: Normal. Negative for fracture or focal lesion. Sinuses/Orbits: Moderate diffuse paranasal sinus mucosal thickening with aerosolized secretions in the maxillary sinuses. Normal aeration of mastoid air cells. Bilateral intra-ocular lens replacement. Other: None. IMPRESSION: 1. Stable mixed attenuation hematoma in the right temporal lobe measuring up to 6 cm, 23 cc. Stable mass effect with 3 mm right-to-left midline shift. Stable small volume subarachnoid hemorrhage over right convexity and trace intraventricular hemorrhage likely due to redistribution. 2. No new acute intracranial abnormality identified. These results were called by telephone at the time of interpretation on 07/10/2017 at 2:14 am to Dr. Kerney Elbe , who verbally acknowledged these results. Electronically Signed   By: Kristine Garbe M.D.   On: 07/10/2017 02:13   Mr Virgel Paling VQ Contrast  Result Date: 07/10/2017 CLINICAL DATA:  Right temporal lobe intraparenchymal hemorrhage with suspicion of venous thrombosis. EXAM: MRA head without CONTRAST MRV HEAD WITHOUT CONTRAST TECHNIQUE: Multiplanar, multiecho pulse sequences of the brain and surrounding structures were obtained with intravenous contrast. Angiographic images of the intracranial venous structures were obtained using MRV technique without intravenous contrast. COMPARISON:  MRI 07/09/2017.  CT studies earlier same day. FINDINGS: MRA HEAD FINDINGS Some motion degradation. Both internal carotid arteries are widely patent through the skull base and siphon regions. Left anterior and middle cerebral vessels are normal without proximal stenosis, aneurysm or vascular  malformation. On the right, the anterior cerebral vessels appear normal. The right M1 segment is patent. There is distortion of the right middle cerebral artery branches secondary to the intraparenchymal hematoma. I do not suspect a primary arterial abnormality. No sign of aneurysm or high flow vascular malformation. Posterior circulation vessels appear normal. Both vertebral arteries widely patent to the basilar. No basilar stenosis. Posterior circulation branch vessels appear normal, with some motion degradation. MRV HEAD FINDINGS Superior sagittal sinus is patent. Deep veins are patent. Left transverse sinus, sigmoid sinus and jugular vein are patent. There thrombosis of the right transverse sinus, sigmoid sinus and jugular vein. IMPRESSION: MR venography confirmed thrombosis of the right transverse sinus, sigmoid sinus and jugular vein. Superior sagittal sinus, deep venous system and left sided drainage are patent. MR angiography does not show any primary arterial side pathology. There is diminished visualization of the right MCA branches due to the regional hematoma. No sign of aneurysm or high flow vascular abnormality. Electronically Signed   By: Nelson Chimes M.D.   On: 07/10/2017 11:04   Mr Jeri Cos QV Contrast  Result Date: 07/09/2017 CLINICAL DATA:  Initial evaluation for persistent headaches for past 2 weeks EXAM: MRI HEAD WITHOUT AND WITH CONTRAST TECHNIQUE: Multiplanar, multiecho pulse sequences of the brain and surrounding structures were obtained without and with intravenous contrast. CONTRAST:  20 cc of MultiHance. COMPARISON:  None available. FINDINGS: Brain: Study moderately degraded by motion artifact. Generalized age-related cerebral atrophy. Patchy T2/FLAIR hyperintensity within the periventricular and deep white matter both cerebral hemispheres, most consistent with chronic small vessel ischemic disease, mild in nature. There is an acute to subacute appearing  intraparenchymal hematoma  centered at the peripheral right temporal lobe measuring approximately 6.1 x 3.1 x 3.8 cm (series 8, image 11). Surrounding rim of vasogenic edema with mild regional mass effect. Associated small volume subarachnoid hemorrhage within the adjacent right frontotemporal region, evident on SWI sequence (series 10, image 52). Additionally, evidence for extra-axial extension with a in 3 mm right holo hemispheric subdural hematoma. Trace layering blood products within the occipital horns of both lateral ventricles, suspected to be related to redistribution (series 10, image 33). Mild mass effect on the right lateral ventricle which is partially attenuated. Trace 3 mm right-to-left shift. No hydrocephalus or ventricular trapping. Basilar cisterns are patent. Scattered leptomeningeal enhancement within the right frontotemporal region likely reactive. No discernible mass lesion seen underlying the intraparenchymal hematoma. Abnormal flow void within the right transverse and sigmoid sinus, suspicious for possible venous sinus thrombosis (series 7, image 8). No evidence for acute infarct. Gray-white matter differentiation otherwise maintained. No other encephalomalacia to suggest chronic infarction. No other mass lesion. Pituitary gland within normal limits. Midline structures intact and normal. Vascular: Major arterial vascular flow voids maintained at the skull base. Skull and upper cervical spine: Craniocervical junction within normal limits. Upper cervical spine normal. Bone marrow signal intensity within normal limits. No scalp soft tissue abnormality. Sinuses/Orbits: Globes and orbital soft tissues within normal limits. Patient status post lens extraction bilaterally. Scattered mucosal thickening within the maxillary sinuses and ethmoidal air cells. Trace bilateral mastoid effusions noted. Other: None. IMPRESSION: 1. 6.1 x 3.1 x 3.8 cm acute to subacute intraparenchymal hematoma positioned within the anterior right  temporal lobe. Associated edema with regional mass effect with trace 3 mm right-to-left midline shift. 2. Associated small volume subarachnoid hemorrhage within the adjacent right frontotemporal region. Trace intraventricular blood products suspected to be related to redistribution. 3. Evidence for intra-axial extension with small 3 mm right holo hemispheric subdural hematoma. 4. Abnormal flow void within the right transverse and sigmoid sinuses, suspicious for venous sinus thrombosis. Findings suspected to be the underlying etiology for the intraparenchymal hemorrhage. 5. Mild chronic small vessel ischemic disease. Critical Value/emergent results were called by telephone at the time of interpretation on 07/09/2017 at 9:19 pm to Dr. Tomi Bamberger , who verbally acknowledged these results. Electronically Signed   By: Jeannine Boga M.D.   On: 07/09/2017 21:20   Mr Mrv Head Wo Cm  Result Date: 07/10/2017 CLINICAL DATA:  Right temporal lobe intraparenchymal hemorrhage with suspicion of venous thrombosis. EXAM: MRA head without CONTRAST MRV HEAD WITHOUT CONTRAST TECHNIQUE: Multiplanar, multiecho pulse sequences of the brain and surrounding structures were obtained with intravenous contrast. Angiographic images of the intracranial venous structures were obtained using MRV technique without intravenous contrast. COMPARISON:  MRI 07/09/2017.  CT studies earlier same day. FINDINGS: MRA HEAD FINDINGS Some motion degradation. Both internal carotid arteries are widely patent through the skull base and siphon regions. Left anterior and middle cerebral vessels are normal without proximal stenosis, aneurysm or vascular malformation. On the right, the anterior cerebral vessels appear normal. The right M1 segment is patent. There is distortion of the right middle cerebral artery branches secondary to the intraparenchymal hematoma. I do not suspect a primary arterial abnormality. No sign of aneurysm or high flow vascular  malformation. Posterior circulation vessels appear normal. Both vertebral arteries widely patent to the basilar. No basilar stenosis. Posterior circulation branch vessels appear normal, with some motion degradation. MRV HEAD FINDINGS Superior sagittal sinus is patent. Deep veins are patent. Left transverse sinus, sigmoid sinus and  jugular vein are patent. There thrombosis of the right transverse sinus, sigmoid sinus and jugular vein. IMPRESSION: MR venography confirmed thrombosis of the right transverse sinus, sigmoid sinus and jugular vein. Superior sagittal sinus, deep venous system and left sided drainage are patent. MR angiography does not show any primary arterial side pathology. There is diminished visualization of the right MCA branches due to the regional hematoma. No sign of aneurysm or high flow vascular abnormality. Electronically Signed   By: Nelson Chimes M.D.   On: 07/10/2017 11:04   Ir Angio Intra Extracran Sel Internal Carotid Bilat Mod Sed  Result Date: 07/11/2017 CLINICAL DATA:  Worsening headaches. CT scan revealing right temporal hematoma. Thrombus in the right transverse sinus and right sigmoid sinus EXAM: IR ANGIO INTRA EXTRACRAN SEL INTERNAL CAROTID BILAT MOD SED MEDICATIONS: Heparin 1000 units IV; Marland Kitchen The antibiotic was administered within 1 hour of the procedure ANESTHESIA/SEDATION: Versed  mg IV; Fentanyl  mcg IV Moderate Sedation Time:   minutes The patient was continuously monitored during the procedure by the interventional radiology nurse under my direct supervision. FLUOROSCOPY TIME:  Fluoroscopy Time:  minutes  seconds ( mGy). COMPLICATIONS: None immediate. TECHNIQUE: Informed written consent was obtained from the patient after a thorough discussion of the procedural risks, benefits and alternatives. All questions were addressed. Maximal Sterile Barrier Technique was utilized including caps, mask, sterile gowns, sterile gloves, sterile drape, hand hygiene and skin antiseptic. A  timeout was performed prior to the initiation of the procedure. The right groin was prepped and draped in the usual sterile fashion. Thereafter using modified Seldinger technique, transfemoral access into the right common femoral artery was obtained without difficulty. Over a 0.035 inch guidewire, a 5 French Pinnacle sheath was inserted. Through this, and also over 0.035 inch guidewire, a 5 [place] JB 1 catheter was advanced to the aortic arch region and selectively positioned in the right common carotid artery, the right external carotid artery, the right vertebral artery, the left common carotid artery and the left vertebral artery. FINDINGS: Right CCA: The right common carotid arteriogram demonstrates the right external carotid artery and its major branches to be widely patent. Right ICA: The right internal carotid artery at the bulb to the cranial skull base opacifies normally. Right ECA: The selective right external carotid artery injection demonstrates normal opacification of the right external carotid artery branches. Right Intracranial: The right middle cerebral artery and the right anterior cerebral artery opacify normally into the capillary and venous phases. Transient cross filling via the anterior communicating artery of the left anterior cerebral artery A2 segment is noted. Right Sided Anterior Venous: The petrous, cavernous and supraclinoid segments are widely patent. The venous phase demonstrates preferential egress of contrast into the right transverse sinus, the sigmoid sinus and the right internal jugular vein. There is no early venous shunting into the venous structures at the skull base or intracranially into the dural sinuses. Right Vertebral Extracranial: The origin of the right vertebral artery is normal. The vessel is seen to opacify normally to the cranial skull base. Normal opacification is seen of the right vertebrobasilar junction and the right posterior-inferior cerebellar artery. Right  Vertebral Intracranial: The opacified portion of the basilar artery, the right posterior cerebral artery, the superior cerebellar arteries and the anterior-inferior cerebellar arteries is normal into the capillary and venous phases. Non-opacified blood is seen in the basilar artery from the contralateral vertebral artery. Right Sided Posterior Venous: The right transverse sinus and the sigmoid sinus are normal and widely patent.  ________________________________________________________ Left CCA: The left common carotid arteriogram demonstrates the left external carotid artery and its major branches to be widely patent. Left ICA: The left internal carotid artery at the bulb to the cranial skull base opacifies normally. Left ECA: The selective left external carotid artery injection demonstrates normal opacification of the left external carotid artery branches. Left Intracranial: The left middle cerebral artery and the left anterior cerebral artery opacify normally into the capillary and venous phases. Transient cross filling via the anterior communicating artery of the right anterior cerebral artery A2 segment is noted. Left Sided Anterior Venous: The petrous, cavernous and supraclinoid segments are widely patent. The venous phase demonstrates preferential egress of contrast into the left transverse sinus, the sigmoid sinus and the left internal jugular vein. There is no early venous shunting into the venous structures at the skull base or intracranially into the dural sinuses. Left Vertebral Extracranial: The origin of the left vertebral artery is normal. The vessel is seen to opacify normally to the cranial skull base. Normal opacification is seen of the left vertebrobasilar junction and the lfet posterior-inferior cerebellar artery. Left Vertebral Intracranial: The opacified portion of the basilar artery, the left posterior cerebral artery, the superior cerebellar arteries and the anterior-inferior cerebellar  arteries is normal into the capillary and venous phases. Non-opacified blood is seen in the basilar artery from the contralateral vertebral artery. Left Sided Posterior Venous: The left transverse sinus and the sigmoid sinus are normal and widely patent. Electronically Signed   By: Luanne Bras M.D.   On: 07/11/2017 09:12   Ir Angio Vertebral Sel Vertebral Bilat Mod Sed  Result Date: 07/11/2017 CLINICAL DATA:  Progressive headaches. Right temporal hemorrhage. Thrombosis within the right transverse sinus and right sigmoid sinus. EXAM: IR ANGIO EXTERNAL CAROTID SEL EXT CAROTID UNI RIGHT MOD SED COMPARISON:  CT of the head, and MRI of the brain of 07/10/2017. MEDICATIONS: No heparin was given. No antibiotic was administered within 1 hour of the procedure. ANESTHESIA/SEDATION: Versed 1 mg IV; Fentanyl 50 mcg IV. Moderate Sedation Time:  60 minutes. The patient was continuously monitored during the procedure by the interventional radiology nurse under my direct supervision. CONTRAST:  Isovue 300 approximately 80 cc. FLUOROSCOPY TIME:  Fluoroscopy Time: 13 minutes 54 seconds (3213 mGy). COMPLICATIONS: None immediate. TECHNIQUE: Informed written consent was obtained from the patient after a thorough discussion of the procedural risks, benefits and alternatives. All questions were addressed. Maximal Sterile Barrier Technique was utilized including caps, mask, sterile gowns, sterile gloves, sterile drape, hand hygiene and skin antiseptic. A timeout was performed prior to the initiation of the procedure. The right groin was prepped and draped in the usual sterile fashion. Thereafter using modified Seldinger technique, transfemoral access into the right common femoral artery was obtained without difficulty. Over a 0.035 inch guidewire, a 5 French Pinnacle sheath was inserted. Through this, and also over 0.035 inch guidewire, a 5 [place] JB 1 catheter was advanced to the aortic arch region and selectively positioned in  the right common carotid artery, the right external carotid artery, the right vertebral artery, the left common carotid artery and the left vertebral artery. FINDINGS: The right common carotid arteriogram demonstrates the right external carotid artery and its major branches to be widely patent. A selective right external carotid arteriogram demonstrates no abnormal arteriovenous shunting between the extracranial arterial and venous systems, and also the dural venous drainage. The right internal carotid artery at the bulb to the cranial skull base opacifies normally. The petrous,  cavernous and supraclinoid segments demonstrate wide patency. The right middle cerebral artery and the right anterior cerebral artery demonstrate wide patency. However, mass effect on the perisylvian triangle being pushed superiorly and somewhat medially secondary to the large right temporal hematoma. No evidence of tumor blush, or of stasis of contrast is noted in this region. The venous phase demonstrates delayed clearance of contrast from the venules. The drainage on the venous side is primarily into the left transverse sinus. The right transverse sinus and the right sigmoid sinus do not opacify. There is a significantly attenuated right internal jugular vein. The right vertebral artery origin is widely patent. The vessel is seen to opacify normally to the cranial skull base. Wide patency is seen of the right posterior-cerebellar artery and the right vertebrorbasilar junction. The opacified portion of the basilar artery, the posterior cerebral arteries, superior cerebellar arteries and the anterior-inferior cerebellar arteries is noted into the capillary and venous phases. Non-opacified blood is seen in the basilar artery from the contralateral vertebral artery. The venous phase again demonstrates nonvisualization of the right transverse sinus and the right sigmoid sinus. Contrast is noted into the left transverse sinus, the left sigmoid  sinus and the internal jugular vein. There appears to be stasis with prolonged clearance of the veins overlying the right cerebellar hemisphere underlying the tentorium cerebellum and the posterior aspect of the medial right occipital lobe. The left common carotid arteriogram demonstrates the left external carotid artery and its major branches to be widely patent. The left internal carotid artery at the bulb to the cranial skull base opacifies widely. The petrous, cavernous and supraclinoid segments demonstrate wide patency. A left posterior communicating artery is seen opacifying the left posterior cerebral artery distribution. There is approximately 50% stenosis of the mid M1 segment of the left middle cerebral artery. The MCA trifurcation branches and the left anterior cerebral artery, otherwise, opacify into the capillary and venous phases. Again demonstrated is nonvisualization of the right transverse and sigmoid sinuses. The origin of left vertebral artery demonstrates patency though with mild tortuosity. The vessel, otherwise, is seen to opacify normally to the cranial skull base. The left posterior-inferior cerebellar artery and left vertebrobasilar junction demonstrate wide patency. The opacified portion of the basilar artery, the posterior cerebral arteries, the superior cerebellar arteries and the anterior-inferior cerebellar arteries progress into the capillary and venous phases. Again noted is nonvisualization of the right transverse sinus and the right sigmoid sinus. IMPRESSION: Angiographically nonvisualization of the right transverse sinus and sigmoid sinus with a severely attenuated caliber of the right internal jugular vein. This is very suspicious related to dural sinus thrombosis. Slow egress of contrast overlying the right cortical cerebral hemisphere in the venous phase consistent with venous congestion, possibly related to the large temporal hematoma. Suspicion remains of a raised venous  pressure overlying the right cerebral hemisphere. No angiographic evidence of extracranial or intracranial arteriovenous shunting at this time. PLAN: Findings reviewed with the patient's family and referring neurologist. Suggest repeat arteriogram in about 6-8 weeks to ensure no evidence of arteriovenous fistulous communications intracranially or extra cranially. And also to evaluate for evidence of recanalization of the occluded right transverse sinus and right sigmoid sinus. Electronically Signed   By: Luanne Bras M.D.   On: 07/10/2017 14:45   Ir Angio External Carotid Sel Ext Carotid Uni R Mod Sed  Result Date: 07/11/2017 CLINICAL DATA:  Progressive headaches. Right temporal hemorrhage. Thrombosis within the right transverse sinus and right sigmoid sinus. EXAM: IR ANGIO EXTERNAL  CAROTID SEL EXT CAROTID UNI RIGHT MOD SED COMPARISON:  CT of the head, and MRI of the brain of 07/10/2017. MEDICATIONS: No heparin was given. No antibiotic was administered within 1 hour of the procedure. ANESTHESIA/SEDATION: Versed 1 mg IV; Fentanyl 50 mcg IV. Moderate Sedation Time:  60 minutes. The patient was continuously monitored during the procedure by the interventional radiology nurse under my direct supervision. CONTRAST:  Isovue 300 approximately 80 cc. FLUOROSCOPY TIME:  Fluoroscopy Time: 13 minutes 54 seconds (3213 mGy). COMPLICATIONS: None immediate. TECHNIQUE: Informed written consent was obtained from the patient after a thorough discussion of the procedural risks, benefits and alternatives. All questions were addressed. Maximal Sterile Barrier Technique was utilized including caps, mask, sterile gowns, sterile gloves, sterile drape, hand hygiene and skin antiseptic. A timeout was performed prior to the initiation of the procedure. The right groin was prepped and draped in the usual sterile fashion. Thereafter using modified Seldinger technique, transfemoral access into the right common femoral artery was obtained  without difficulty. Over a 0.035 inch guidewire, a 5 French Pinnacle sheath was inserted. Through this, and also over 0.035 inch guidewire, a 5 [place] JB 1 catheter was advanced to the aortic arch region and selectively positioned in the right common carotid artery, the right external carotid artery, the right vertebral artery, the left common carotid artery and the left vertebral artery. FINDINGS: The right common carotid arteriogram demonstrates the right external carotid artery and its major branches to be widely patent. A selective right external carotid arteriogram demonstrates no abnormal arteriovenous shunting between the extracranial arterial and venous systems, and also the dural venous drainage. The right internal carotid artery at the bulb to the cranial skull base opacifies normally. The petrous, cavernous and supraclinoid segments demonstrate wide patency. The right middle cerebral artery and the right anterior cerebral artery demonstrate wide patency. However, mass effect on the perisylvian triangle being pushed superiorly and somewhat medially secondary to the large right temporal hematoma. No evidence of tumor blush, or of stasis of contrast is noted in this region. The venous phase demonstrates delayed clearance of contrast from the venules. The drainage on the venous side is primarily into the left transverse sinus. The right transverse sinus and the right sigmoid sinus do not opacify. There is a significantly attenuated right internal jugular vein. The right vertebral artery origin is widely patent. The vessel is seen to opacify normally to the cranial skull base. Wide patency is seen of the right posterior-cerebellar artery and the right vertebrorbasilar junction. The opacified portion of the basilar artery, the posterior cerebral arteries, superior cerebellar arteries and the anterior-inferior cerebellar arteries is noted into the capillary and venous phases. Non-opacified blood is seen in the  basilar artery from the contralateral vertebral artery. The venous phase again demonstrates nonvisualization of the right transverse sinus and the right sigmoid sinus. Contrast is noted into the left transverse sinus, the left sigmoid sinus and the internal jugular vein. There appears to be stasis with prolonged clearance of the veins overlying the right cerebellar hemisphere underlying the tentorium cerebellum and the posterior aspect of the medial right occipital lobe. The left common carotid arteriogram demonstrates the left external carotid artery and its major branches to be widely patent. The left internal carotid artery at the bulb to the cranial skull base opacifies widely. The petrous, cavernous and supraclinoid segments demonstrate wide patency. A left posterior communicating artery is seen opacifying the left posterior cerebral artery distribution. There is approximately 50% stenosis of the mid  M1 segment of the left middle cerebral artery. The MCA trifurcation branches and the left anterior cerebral artery, otherwise, opacify into the capillary and venous phases. Again demonstrated is nonvisualization of the right transverse and sigmoid sinuses. The origin of left vertebral artery demonstrates patency though with mild tortuosity. The vessel, otherwise, is seen to opacify normally to the cranial skull base. The left posterior-inferior cerebellar artery and left vertebrobasilar junction demonstrate wide patency. The opacified portion of the basilar artery, the posterior cerebral arteries, the superior cerebellar arteries and the anterior-inferior cerebellar arteries progress into the capillary and venous phases. Again noted is nonvisualization of the right transverse sinus and the right sigmoid sinus. IMPRESSION: Angiographically nonvisualization of the right transverse sinus and sigmoid sinus with a severely attenuated caliber of the right internal jugular vein. This is very suspicious related to dural  sinus thrombosis. Slow egress of contrast overlying the right cortical cerebral hemisphere in the venous phase consistent with venous congestion, possibly related to the large temporal hematoma. Suspicion remains of a raised venous pressure overlying the right cerebral hemisphere. No angiographic evidence of extracranial or intracranial arteriovenous shunting at this time. PLAN: Findings reviewed with the patient's family and referring neurologist. Suggest repeat arteriogram in about 6-8 weeks to ensure no evidence of arteriovenous fistulous communications intracranially or extra cranially. And also to evaluate for evidence of recanalization of the occluded right transverse sinus and right sigmoid sinus. Electronically Signed   By: Luanne Bras M.D.   On: 07/10/2017 14:45   TTE - Very technically difficult study with poor endocardial definition. Definity contrast was not given. LVEF is grossly normal at 55-60%, mild LVH, inadequate for wall motion, grade 1 DD with indeterminate LV filling pressure.  Component     Latest Ref Rng & Units 07/10/2017 07/11/2017  Cholesterol     0 - 200 mg/dL  159  Triglycerides     <150 mg/dL  78  HDL Cholesterol     >40 mg/dL  36 (L)  Total CHOL/HDL Ratio     RATIO  4.4  VLDL     0 - 40 mg/dL  16  LDL (calc)     0 - 99 mg/dL  107 (H)  PTT Lupus Anticoagulant     0.0 - 51.9 sec 31.5   DRVVT     0.0 - 47.0 sec 49.7 (H)   Lupus Anticoag Interp      Comment:   Beta-2 Glycoprotein I Ab, IgG     0 - 20 GPI IgG units <9   Beta-2-Glycoprotein I IgM     0 - 32 GPI IgM units <9   Beta-2-Glycoprotein I IgA     0 - 25 GPI IgA units <9   Anticardiolipin Ab,IgG,Qn     0 - 14 GPL U/mL 10   Anticardiolipin Ab,IgM,Qn     0 - 12 MPL U/mL <9   Anticardiolipin Ab,IgA,Qn     0 - 11 APL U/mL <9   Hemoglobin A1C     4.8 - 5.6 %  5.3  Mean Plasma Glucose     mg/dL  105.41  Antithrombin Activity     75 - 120 % 92   Protein C-Functional     73 - 180 % 107    Protein C, Total     60 - 150 % 92   Protein S-Functional     63 - 140 % 72   Protein S, Total     60 - 150 % 88  Homocysteine     0.0 - 15.0 umol/L 13.1   Recommendations-F5LEID:      Comment   Recommendations-PTGENE:      Comment   dRVVT Mix     0.0 - 47.0 sec 41.6     Assessment: As you may recall, he is a 72 y.o. Caucasian male with PMH of prostate cancer, HTN, HLD, polycythemia, OSA admitted on 07/13/17 for right frontal headache and nausea. MRI showed right temporal ICH with likely transverse sinus thrombosis. MRA showed no AVM or aneurysm. MRV confirmed right transverse sinus, sigmoid sinus and jugular vein thrombosis. Also had DSA confirmed above findings. EF 55% and pan CT no malignancy but left kidney stone with hydronephrosis. LDL 103 and A1C 5.3. He was put on IV heparin and then transitioned to lovenox and then eliquis after stabilization. HA resolved and d/c on eliquis and lipitor. HA resolved. Followed with urologist due to intermittent gross hematuria.  Had cystoscopy which was unremarkable, considered it was caused by large left renal calculi and is on eliquis. Recommend to follow up in 3 months, if neuro stable, may consider uretheroscopic management of renal calculi. Due to CSVT unprovoked, critical area and low bleeding risk, would recommend eliquis life time according to recent DVT guidelines.   Plan:  - continue eliquis and lpitor for thrombosis prevention - follow up with urologist for kidney stone management. OK to stop eliquis 48 hrs before the surgery and resume once hemodynamically stable - recommend screening colonoscopy 6 months after the discharge - will do CT and CTV to evaluate blood absorption and venous status - Follow up with your primary care physician for stroke risk factor modification. Recommend maintain blood pressure goal <130/80, diabetes with hemoglobin A1c goal below 7.0% and lipids with LDL cholesterol goal below 70 mg/dL.  - healthy diet and  regular exercise  - check BP at home and record. If BP constantly, call PCP for medication adjustment.  - drink water and avoid hydration  - follow up in 3 months with Janett Billow  I spent more than 25 minutes of face to face time with the patient. Greater than 50% of time was spent in counseling and coordination of care. We discussed medication duration, repeat CT and follow up with urologist.    Orders Placed This Encounter  Procedures  . CT HEAD WO CONTRAST    Standing Status:   Future    Standing Expiration Date:   11/27/2018    Order Specific Question:   Preferred imaging location?    Answer:   [facility]    Order Specific Question:   Radiology Contrast Protocol - do NOT remove file path    Answer:   \\[email or web address].[email or web address]  . CT VENOGRAM HEAD    Standing Status:   Future    Standing Expiration Date:   11/27/2018    Order Specific Question:   Preferred imaging location?    Answer:   [facility]    Order Specific Question:   Radiology Contrast Protocol - do NOT remove file path    Answer:   \\[email or web address]    No orders of the defined types were placed in this encounter.   Patient Instructions  - continue eliquis and lpitor for thrombosis prevention - follow up with urologist for kidney stone management. OK to stop eliquis 48 hrs before the surgery and resume once hemodynamically stable - recommend screening colonoscopy 6 months after the discharge - will do CT and CTV to evaluate blood absorption and venous  status - Follow up with your primary care physician for stroke risk factor modification. Recommend maintain blood pressure goal <130/80, diabetes with hemoglobin A1c goal below 7.0% and lipids with LDL cholesterol goal below 70 mg/dL.  - healthy diet and regular exercise  - check BP at home and record. If BP constantly, call PCP for medication adjustment.  - drink water and avoid hydration  - follow up in 3 months  with Ace Gins, MD PhD [facility] [address] [place],  [address] [phone]

## 2017-08-27 ENCOUNTER — Encounter: Payer: Self-pay | Admitting: Physical Therapy

## 2017-08-27 NOTE — Therapy (Signed)
[facility] [address] [place], [place], [address] Phone: [phone]   Fax:  [phone]  Physical Therapy Treatment  Patient Details  Name: Justin Adams MRN: [id number] Date of Birth: 17-Feb-1946 Referring Provider: Rosalin Hawking, MD   Encounter Date: 08/26/2017  PT End of Session - 08/27/17 1138    Visit Number  8    Number of Visits  13    Date for PT Re-Evaluation  09/26/17    Authorization Type  Medicare and [place], covered 100%    PT Start Time  0933    PT Stop Time  1016    PT Time Calculation (min)  43 min       Past Medical History:  Diagnosis Date  . Adenomatous polyp   . Cancer [facility]) 2003   prostate  . Hemorrhoids   . Hypercholesteremia   . Hypertension   . Muscular degeneration   . Nephrolithiasis   . Obesity   . Polycythemia   . Shingles   . Sleep apnea     Past Surgical History:  Procedure Laterality Date  . CHOLECYSTECTOMY    . IR ANGIO EXTERNAL CAROTID SEL EXT CAROTID UNI R MOD SED  07/10/2017  . IR ANGIO INTRA EXTRACRAN SEL INTERNAL CAROTID BILAT MOD SED  07/10/2017  . IR ANGIO VERTEBRAL SEL VERTEBRAL BILAT MOD SED  07/10/2017  . LEFT HEART CATHETERIZATION WITH CORONARY ANGIOGRAM N/A 03/15/2013   Procedure: LEFT HEART CATHETERIZATION WITH CORONARY ANGIOGRAM;  Surgeon: Minus Breeding, MD;  Location: [facility] CATH LAB;  Service: Cardiovascular;  Laterality: N/A;  . PROSTATECTOMY    . STONE EXTRACTION WITH BASKET      Vitals:   08/26/17 0939  BP: 113/62  Pulse: 79    Subjective Assessment - 08/27/17 1136    Subjective  Pt states Dr. Erlinda Hong ordered CAT scan in a week - told him to hold off on gym til results of CAT scan are known    Pertinent History  HTN, hyperlipidemia, adenomatous polyp, prostate CA, muscular degeneration, nephrolithiasis, obesity, polycythemia, and shingles    Patient Stated Goals  Pt would like to have less back pain, improved endurance, and increased LE strength  to get off of low surfaces     Currently in Pain?  No/denies         [facility] PT Assessment - 08/26/17 0945      Berg Balance Test   Sit to Stand  Able to stand  independently using hands    Standing Unsupported  Able to stand safely 2 minutes    Sitting with Back Unsupported but Feet Supported on Floor or Stool  Able to sit safely and securely 2 minutes    Stand to Sit  Sits safely with minimal use of hands    Transfers  Able to transfer safely, definite need of hands    Standing Unsupported with Eyes Closed  Able to stand 10 seconds safely    Standing Ubsupported with Feet Together  Able to place feet together independently and stand 1 minute safely    From Standing, Reach Forward with Outstretched Arm  Can reach confidently >25 cm (10")    From Standing Position, Pick up Object from Floor  Able to pick up shoe safely and easily    From Standing Position, Turn to Look Behind Over each Shoulder  Looks behind from both sides and weight shifts well    Turn 360 Degrees  Able to turn 360 degrees safely  in 4 seconds or less    Standing Unsupported, Alternately Place Feet on Step/Stool  Able to stand independently and safely and complete 8 steps in 20 seconds 9.46 secs    Standing Unsupported, One Foot in Front  Able to plae foot ahead of the other independently and hold 30 seconds    Standing on One Leg  Able to lift leg independently and hold 5-10 seconds LLE= 5.56    Total Score  52       5 times sit to stand - performed from standard chair; bil. UE support needed to perform sit to stand transfer - 15.06 secs (STG closely approximated as goal stated < 15 secs)   TherEx;  Pt performed quadruped exercise - lifting opposite UE/LE with attempted 3 sec hold - 3 reps each side with min assist To maintain balance- pt had more difficulty with lifting RUE/LLE due to difficulty weight bearing on LUE  Pt performed bridging x 10 reps;  Bridging with LE extension x 5 reps each leg;  Bridging with  marching x 5 reps;  Bridging with hip abduction/adduction X 5 reps (added these last 3 exercises to HEP)                  PT Education - 08/27/17 1137    Education provided  Yes    Education Details  added bridging exercises to HEP for hip strengthening and trunk/core stabilization    Person(s) Educated  Patient    Methods  Explanation;Demonstration;Handout    Comprehension  Verbalized understanding;Returned demonstration       PT Short Term Goals - 08/26/17 0940      PT SHORT TERM GOAL #1   Title  Pt will report performing walking program, HEP and [employer] 2-3 days/week    Baseline  Pt reports doing balance exercises at home with wife's supervision and walking 15"/day twice a day when able     (08-26-17); not going to gym yet per MD's orders    Status  Partially Met      PT SHORT TERM GOAL #2   Title  Pt will improve functional LE strength decreasing five time sit to stand to </= 15 seconds    Baseline  18.38 seconds with UE support;  15.06 secs with bil. UE support from standard chair - 08-26-17    Status  Not Met      PT SHORT TERM GOAL #3   Title  Pt will improve BERG balance to >/= 50/56    Baseline  44/56;  52/56 on 08-26-17    Status  Achieved      PT SHORT TERM GOAL #4   Title  Pt will improve functional endurance on 6 minute walk test by increasing distance by 150'    Baseline  1385 feet; 1417 feet - increased by 32' - improved but not to goal    Status  Not Met        PT Long Term Goals - 07/28/17 1345      PT LONG TERM GOAL #1   Title  Pt will report performing HEP, walking program and [employer] 5/7 days a week    Time  8    Period  Weeks    Status  New    Target Date  09/26/17      PT LONG TERM GOAL #2   Title  Pt will report increase in overall function as indicated by increase in FOTO to >/= 80% by D/C  Baseline  66%    Time  8    Period  Weeks    Status  New    Target Date  09/26/17      PT LONG TERM GOAL #3   Title  Will  improve five time sit to stand to </= 13 seconds    Time  8    Period  Weeks    Status  New    Target Date  09/26/17      PT LONG TERM GOAL #4   Title  Will improve BERG score to >/= 53/56 to lower falls risk    Time  8    Period  Weeks    Status  New    Target Date  09/26/17      PT LONG TERM GOAL #5   Title  Will improve 6 minute walk test distance by 250' (from eval)    Baseline  1385 at eval    Time  8    Period  Weeks    Status  New    Target Date  09/26/17            Plan - 08/27/17 1140    Clinical Impression Statement  Pt has met LTG #3 and partially met LTG #1: LTG's #2 and 4 not met due to decreased LE strength and decreased gait speed    PT Frequency  Other (comment)    PT Duration  8 weeks    PT Treatment/Interventions  ADLs/Self Care Home Management;Gait training;Stair training;Functional mobility training;Therapeutic activities;Therapeutic exercise;Balance training;Neuromuscular re-education;Patient/family education;Energy conservation    PT Next Visit Plan  cont LE strengthening and balance training - cont toward LTG's    Consulted and Agree with Plan of Care  Patient       Patient will benefit from skilled therapeutic intervention in order to improve the following deficits and impairments:  Decreased activity tolerance, Decreased balance, Decreased endurance, Decreased strength  Visit Diagnosis: Muscle weakness (generalized)  Unsteadiness on feet     Problem List Patient Active Problem List   Diagnosis Date Noted  . Essential hypertension 08/26/2017  . Hyperlipidemia 08/26/2017  . Cerebral venous thrombosis 07/13/2017  . Intracerebral hemorrhage 07/10/2017  . ICH (intracerebral hemorrhage) ([place]) 07/10/2017  . Unstable angina ([place]) 03/12/2013    Tacara Hadlock, [place], PT 08/27/2017, 12:49 PM  [facility] [address] [place], [place], [address] Phone: [phone]   Fax:   [phone]  Name: JAQUANN GUARISCO MRN: [id number] Date of Birth: 02-09-1946

## 2017-08-28 ENCOUNTER — Ambulatory Visit: Payer: Medicare Other | Admitting: Physical Therapy

## 2017-08-28 ENCOUNTER — Encounter: Payer: Self-pay | Admitting: Physical Therapy

## 2017-08-28 DIAGNOSIS — R2681 Unsteadiness on feet: Secondary | ICD-10-CM

## 2017-08-28 DIAGNOSIS — I69154 Hemiplegia and hemiparesis following nontraumatic intracerebral hemorrhage affecting left non-dominant side: Secondary | ICD-10-CM

## 2017-08-28 DIAGNOSIS — M6281 Muscle weakness (generalized): Secondary | ICD-10-CM | POA: Diagnosis not present

## 2017-08-28 DIAGNOSIS — R29818 Other symptoms and signs involving the nervous system: Secondary | ICD-10-CM | POA: Diagnosis not present

## 2017-08-28 NOTE — Therapy (Signed)
[facility] [address] [place], [place], [address] Phone: [phone]   Fax:  [phone]  Physical Therapy Treatment  Patient Details  Name: AVIR DERUITER MRN: [id number] Date of Birth: Jun 09, 1946 Referring Provider: Rosalin Hawking, MD   Encounter Date: 08/28/2017  PT End of Session - 08/28/17 1217    Visit Number  9    Number of Visits  13    Date for PT Re-Evaluation  09/26/17    Authorization Type  Medicare and AARP, Plan F, covered 100%    PT Start Time  1015    PT Stop Time  1056    PT Time Calculation (min)  41 min    Equipment Utilized During Treatment  Gait belt    Activity Tolerance  Patient tolerated treatment well    Behavior During Therapy  WFL for tasks assessed/performed       Past Medical History:  Diagnosis Date  . Adenomatous polyp   . Cancer [facility]) 2003   prostate  . Hemorrhoids   . Hypercholesteremia   . Hypertension   . Muscular degeneration   . Nephrolithiasis   . Obesity   . Polycythemia   . Shingles   . Sleep apnea     Past Surgical History:  Procedure Laterality Date  . CHOLECYSTECTOMY    . IR ANGIO EXTERNAL CAROTID SEL EXT CAROTID UNI R MOD SED  07/10/2017  . IR ANGIO INTRA EXTRACRAN SEL INTERNAL CAROTID BILAT MOD SED  07/10/2017  . IR ANGIO VERTEBRAL SEL VERTEBRAL BILAT MOD SED  07/10/2017  . LEFT HEART CATHETERIZATION WITH CORONARY ANGIOGRAM N/A 03/15/2013   Procedure: LEFT HEART CATHETERIZATION WITH CORONARY ANGIOGRAM;  Surgeon: Minus Breeding, MD;  Location: [facility] CATH LAB;  Service: Cardiovascular;  Laterality: N/A;  . PROSTATECTOMY    . STONE EXTRACTION WITH BASKET      There were no vitals filed for this visit.  Subjective Assessment - 08/28/17 1016    Subjective  Pt reported no pain, no changes in medications, and no falls since being here yesterday. Also states, "my new exercises are going well, its a workout." Pt also stated, He talked to his neuro doctor yesterday  and told him to wait on exercise clearance until after next friday 29th procedure (CT scan to be perfomed. )    Patient is accompained by:  Family member    Pertinent History  HTN, hyperlipidemia, adenomatous polyp, prostate CA, muscular degeneration, nephrolithiasis, obesity, polycythemia, and shingles    Patient Stated Goals  Pt would like to have less back pain, improved endurance, and increased LE strength to get off of low surfaces     Currently in Pain?  No/denies         [facility] Adult PT Treatment/Exercise - 08/28/17 1155      Ambulation/Gait   Ambulation/Gait  Yes    Ambulation/Gait Assistance  7: Independent    Ambulation Distance (Feet)  50 Feet x2 around clinic.    Assistive device  None    Gait Pattern  Step-through pattern    Ambulation Surface  Level;Indoor      Lumbar Exercises: Aerobic   Tread Mill  Due to patient request, pt wanted to improve his confidence in using his treadmill at home. Pt performed x 10 minutes at 2.0 MPH for the entire time. Pt used bilateral treadmill rails for the first 5 minutes; Pt used no UE assistance for the last 5 minutes. Pt required initial cues for wider gait BOS during  walking due to initial narrow gait pattern. SPTA provided supervision assistance only. Pt also required verbal cues for upright posture. Pt able to perform incorporate proper arm swing for the last 5 mins of treadmill while maintaining safe walking. Pt required no rest breaks.       Lumbar Exercises: Standing   Other Standing Lumbar Exercises  Standing back extension stretch using counter for stable surface support x 10 reps. Provided demo and verbal cues.       Lumbar Exercises: Seated   Other Seated Lumbar Exercises  Seated forward ball roll walkouts using green physioball to provide patient gentle low back stretch. Performed x 10 reps for 5 sec hold at end range. Pt LBP returned to baseline (no pain) after stretch and brief sitting rest break. Provided verbal and  demonstration cues for performance of new task.       Knee/Hip Exercises: Standing   Forward Step Up  15 reps;Step Height: 6";1 set;Other (comment)    Forward Step Up Limitations  Used single UE support. Followed by stepping forward tap on stairs level 1,2,3 (6 , 12, 18 inches)           Balance Exercises - 08/28/17 1211      Balance Exercises: Standing   Standing Eyes Opened  Narrow base of support (BOS);Head turns;Wide (BOA);Foam/compliant surface;2 reps;30 secs    Standing Eyes Closed  Narrow base of support (BOS);Head turns;Wide (BOA);Foam/compliant surface;30 secs;2 reps    Tandem Stance  Eyes open;Eyes closed;Foam/compliant surface;30 secs;2 reps      Balance Exercises: Standing   Standing Eyes Opened Limitations  While standing on airex pad: performed EC with wide stance and head turns left/ right, followed by up/down, followed by EO open stance with B UE shoulder flexion to 90 degrees, EO narrow BOS; performed each x 2 reps for 30 sec holds each.     Standing Eyes Closed Limitations  Standing on airex pad, pt performed EC with open wide stance, EC staggered tandem bilateral, EC narrow BOS, EC open stance with head turns up/down, left/right for 2 reps of 30 seconds each. Pt had swaying laterally left and right during EC and tandem stance; during EC and head turning up and down, pt had a posterior lean whiich caused a steppage strategy to regain balance. SPTA provided supervision to min assistance, and chair in front of patient, and gait belt for safety.           PT Short Term Goals - 08/26/17 0940      PT SHORT TERM GOAL #1   Title  Pt will report performing walking program, HEP and [employer] 2-3 days/week    Baseline  Pt reports doing balance exercises at home with wife's supervision and walking 15"/day twice a day when able     (08-26-17); not going to gym yet per MD's orders    Status  Partially Met      PT SHORT TERM GOAL #2   Title  Pt will improve functional LE  strength decreasing five time sit to stand to </= 15 seconds    Baseline  18.38 seconds with UE support;  15.06 secs with bil. UE support from standard chair - 08-26-17    Status  Not Met      PT SHORT TERM GOAL #3   Title  Pt will improve BERG balance to >/= 50/56    Baseline  44/56;  52/56 on 08-26-17    Status  Achieved  PT SHORT TERM GOAL #4   Title  Pt will improve functional endurance on 6 minute walk test by increasing distance by 150'    Baseline  1385 feet; 1417 feet - increased by 32' - improved but not to goal    Status  Not Met        PT Long Term Goals - 07/28/17 1345      PT LONG TERM GOAL #1   Title  Pt will report performing HEP, walking program and [employer] 5/7 days a week    Time  8    Period  Weeks    Status  New    Target Date  09/26/17      PT LONG TERM GOAL #2   Title  Pt will report increase in overall function as indicated by increase in FOTO to >/= 80% by D/C    Baseline  66%    Time  8    Period  Weeks    Status  New    Target Date  09/26/17      PT LONG TERM GOAL #3   Title  Will improve five time sit to stand to </= 13 seconds    Time  8    Period  Weeks    Status  New    Target Date  09/26/17      PT LONG TERM GOAL #4   Title  Will improve BERG score to >/= 53/56 to lower falls risk    Time  8    Period  Weeks    Status  New    Target Date  09/26/17      PT LONG TERM GOAL #5   Title  Will improve 6 minute walk test distance by 250' (from eval)    Baseline  1385 at eval    Time  8    Period  Weeks    Status  New    Target Date  09/26/17        Plan - 08/28/17 1218    Clinical Impression Statement  Today's session focused on improving static balance while an uneven surfaces, bilateral lower extremity strengthening, and helping patient gain confidence of using home treadmill. Pt did have increased low back pain from baseline which was no pain at start of treatment which was returned to baseline after gentle low back  stretches and sitting rest breaks. Pt tolerated treatment well overall and verbalized HEP compliance. Pt continues to make progress towards goals and would benefit from continued PT services towards reaching unmet PT goals.     History and Personal Factors relevant to plan of care:  independent, driving and working out 3- 7 days a week prior to CVA, HTN, hyperlipdemia, adenmatous polyp, prostate CA, muscular degeneration, nephrolithiasis, obesity, polycythemia, and shingles, goal is to keep SBP <160    Clinical Presentation  Stable    Clinical Presentation due to:  independent, driving and working out 3- 7 days a week prior to CVA, HTN, hyperlipdemia, adenmatous polyp, prostate CA, muscular degeneration, nephrolithiasis, obesity, polycythemia, and shingles, goal is to keep SBP <160    Clinical Decision Making  Low    Rehab Potential  Good    PT Frequency  Other (comment)    PT Duration  8 weeks    PT Treatment/Interventions  ADLs/Self Care Home Management;Gait training;Stair training;Functional mobility training;Therapeutic activities;Therapeutic exercise;Balance training;Neuromuscular re-education;Patient/family education;Energy conservation    PT Next Visit Plan  balance training and LE strengthening towards LTG's.  Consulted and Agree with Plan of Care  Patient       Patient will benefit from skilled therapeutic intervention in order to improve the following deficits and impairments:  Decreased activity tolerance, Decreased balance, Decreased endurance, Decreased strength  Visit Diagnosis: Unsteadiness on feet  Muscle weakness (generalized)  Hemiplegia and hemiparesis following nontraumatic intracerebral hemorrhage affecting left non-dominant side [facility])     Problem List Patient Active Problem List   Diagnosis Date Noted  . Essential hypertension 08/26/2017  . Hyperlipidemia 08/26/2017  . Cerebral venous thrombosis 07/13/2017  . Intracerebral hemorrhage 07/10/2017  . ICH  (intracerebral hemorrhage) ([place]) 07/10/2017  . Unstable angina ([place]) 03/12/2013    Carlena Sax, SPTA 08/28/2017, 12:27 PM  [place] [address] [place], [place], [address] Phone: [phone]   Fax:  [phone]  Name: MAYJOR AGER MRN: [id number] Date of Birth: [birth year]

## 2017-08-29 ENCOUNTER — Other Ambulatory Visit: Payer: Self-pay

## 2017-08-29 MED ORDER — AMLODIPINE BESYLATE 10 MG PO TABS: 10.0000 mg | ORAL_TABLET | Freq: Every day | ORAL | 0 refills | Status: DC

## 2017-08-29 MED ORDER — ATORVASTATIN CALCIUM 20 MG PO TABS: 20.0000 mg | ORAL_TABLET | Freq: Every day | ORAL | 0 refills | Status: DC

## 2017-09-02 ENCOUNTER — Ambulatory Visit: Payer: Medicare Other | Admitting: Physical Therapy

## 2017-09-05 ENCOUNTER — Other Ambulatory Visit: Payer: Medicare Other

## 2017-09-05 ENCOUNTER — Ambulatory Visit
Admission: RE | Admit: 2017-09-05 | Discharge: 2017-09-05 | Disposition: A | Discharge status: Home / Self Care | Payer: Medicare Other | Source: Ambulatory Visit | Attending: Neurology | Admitting: Neurology

## 2017-09-05 DIAGNOSIS — G08 Intracranial and intraspinal phlebitis and thrombophlebitis: Secondary | ICD-10-CM

## 2017-09-05 DIAGNOSIS — K921 Melena: Secondary | ICD-10-CM | POA: Diagnosis not present

## 2017-09-05 DIAGNOSIS — I619 Nontraumatic intracerebral hemorrhage, unspecified: Secondary | ICD-10-CM | POA: Diagnosis not present

## 2017-09-05 DIAGNOSIS — I611 Nontraumatic intracerebral hemorrhage in hemisphere, cortical: Secondary | ICD-10-CM

## 2017-09-05 MED ORDER — IOPAMIDOL (ISOVUE-370) INJECTION 76%
75.0000 mL | Freq: Once | INTRAVENOUS | Status: AC | PRN
  Administered 2017-09-05: 75 mL via INTRAVENOUS

## 2017-09-09 ENCOUNTER — Ambulatory Visit: Payer: Medicare Other | Attending: Neurology | Admitting: Physical Therapy

## 2017-09-09 ENCOUNTER — Encounter: Payer: Self-pay | Admitting: Physical Therapy

## 2017-09-09 DIAGNOSIS — R2681 Unsteadiness on feet: Secondary | ICD-10-CM | POA: Insufficient documentation

## 2017-09-09 DIAGNOSIS — R29818 Other symptoms and signs involving the nervous system: Secondary | ICD-10-CM | POA: Diagnosis not present

## 2017-09-09 DIAGNOSIS — I69154 Hemiplegia and hemiparesis following nontraumatic intracerebral hemorrhage affecting left non-dominant side: Secondary | ICD-10-CM | POA: Diagnosis not present

## 2017-09-09 DIAGNOSIS — M6281 Muscle weakness (generalized): Secondary | ICD-10-CM

## 2017-09-10 NOTE — Therapy (Signed)
[facility] [address] [place], [place], [address] Phone: [phone]   Fax:  [phone]  Physical Therapy Treatment  Patient Details  Name: Justin Adams MRN: [id number] Date of Birth: 1945-10-28 Referring Provider: Rosalin Hawking, MD   Encounter Date: 09/09/2017  PT End of Session - 09/09/17 0935    Visit Number  10    Number of Visits  13    Date for PT Re-Evaluation  09/26/17    Authorization Type  Medicare and AARP, Plan F, covered 100%    PT Start Time  0933    PT Stop Time  1013    PT Time Calculation (min)  40 min    Equipment Utilized During Treatment  Gait belt    Activity Tolerance  Patient tolerated treatment well    Behavior During Therapy  [facility] for tasks assessed/performed       Past Medical History:  Diagnosis Date  . Adenomatous polyp   . Cancer [facility]) 2003   prostate  . Hemorrhoids   . Hypercholesteremia   . Hypertension   . Muscular degeneration   . Nephrolithiasis   . Obesity   . Polycythemia   . Shingles   . Sleep apnea     Past Surgical History:  Procedure Laterality Date  . CHOLECYSTECTOMY    . IR ANGIO EXTERNAL CAROTID SEL EXT CAROTID UNI R MOD SED  07/10/2017  . IR ANGIO INTRA EXTRACRAN SEL INTERNAL CAROTID BILAT MOD SED  07/10/2017  . IR ANGIO VERTEBRAL SEL VERTEBRAL BILAT MOD SED  07/10/2017  . LEFT HEART CATHETERIZATION WITH CORONARY ANGIOGRAM N/A 03/15/2013   Procedure: LEFT HEART CATHETERIZATION WITH CORONARY ANGIOGRAM;  Surgeon: Minus Breeding, MD;  Location[facility] CATH LAB;  Service: Cardiovascular;  Laterality: N/A;  . PROSTATECTOMY    . STONE EXTRACTION WITH BASKET      There were no vitals filed for this visit.  Subjective Assessment - 09/09/17 0934    Subjective  Had the CT scan done Friday, has not heard any results to date. No falls or pain to report. HEP is going well, up to 18-20 1 to 2 times a day.     Patient is accompained by:  Family member    Pertinent  History  HTN, hyperlipidemia, adenomatous polyp, prostate CA, muscular degeneration, nephrolithiasis, obesity, polycythemia, and shingles    Patient Stated Goals  Pt would like to have less back pain, improved endurance, and increased LE strength to get off of low surfaces     Currently in Pain?  No/denies    Pain Score  0-No pain          [facility] Adult PT Treatment/Exercise - 09/09/17 0936      High Level Balance   High Level Balance Activities  Marching forwards;Marching backwards;Tandem walking tandem/heel/toe walk fwd/bwd    High Level Balance Comments  both red mats next to counter top: 3-4 laps each with no UE support to light touch on counter. min guard assist for balance. cues on posture and ex form.       Lumbar Exercises: Aerobic   Tread Mill  x8 minutes with no UE support at 1.8 mph, supervision with cues on posture, step length          Balance Exercises - 09/09/17 0957      Balance Exercises: Standing   Standing Eyes Closed  Wide (BOA);Head turns;Foam/compliant surface;Other reps (comment);30 secs;Limitations    SLS with Vectors  Foam/compliant surface;Limitations  Balance Exercises: Standing   Standing Eyes Closed Limitations  on open blue foam in corner with chair in front for safety: wide base of support with EC no head movements, progressing to EC head movements left/right, up/down and diagonals both ways. min guard to min assist with occasional touch to chair for balance. cues on posture and weight shifting for balance.     SLS with Vectors Limitations  on both red mats with cones along one edge: alternating toe taps to each cone with side stepping left/<>right x 1 lap each way, then alternating double toe tap to each cone with side stepping left<>right x 1 lap each. min assist with cues to slow down and for weight shifting for balance; with cones along both sides of both red mats: alternating lateral toe taps to cones with tandem gait forward x 4 laps with min  assist, cues on form and weight shifting.                                  PT Short Term Goals - 08/26/17 0940      PT SHORT TERM GOAL #1   Title  Pt will report performing walking program, HEP and [employer] 2-3 days/week    Baseline  Pt reports doing balance exercises at home with wife's supervision and walking 15"/day twice a day when able     (08-26-17); not going to gym yet per MD's orders    Status  Partially Met      PT SHORT TERM GOAL #2   Title  Pt will improve functional LE strength decreasing five time sit to stand to </= 15 seconds    Baseline  18.38 seconds with UE support;  15.06 secs with bil. UE support from standard chair - 08-26-17    Status  Not Met      PT SHORT TERM GOAL #3   Title  Pt will improve BERG balance to >/= 50/56    Baseline  44/56;  52/56 on 08-26-17    Status  Achieved      PT SHORT TERM GOAL #4   Title  Pt will improve functional endurance on 6 minute walk test by increasing distance by 150'    Baseline  1385 feet; 1417 feet - increased by 32' - improved but not to goal    Status  Not Met        PT Long Term Goals - 07/28/17 1345      PT LONG TERM GOAL #1   Title  Pt will report performing HEP, walking program and [employer] 5/7 days a week    Time  8    Period  Weeks    Status  New    Target Date  09/26/17      PT LONG TERM GOAL #2   Title  Pt will report increase in overall function as indicated by increase in FOTO to >/= 80% by D/C    Baseline  66%    Time  8    Period  Weeks    Status  New    Target Date  09/26/17      PT LONG TERM GOAL #3   Title  Will improve five time sit to stand to </= 13 seconds    Time  8    Period  Weeks    Status  New    Target Date  09/26/17  PT LONG TERM GOAL #4   Title  Will improve BERG score to >/= 53/56 to lower falls risk    Time  8    Period  Weeks    Status  New    Target Date  09/26/17      PT LONG TERM GOAL #5   Title  Will improve 6 minute walk test distance by 250'  (from eval)    Baseline  1385 at eval    Time  8    Period  Weeks    Status  New    Target Date  09/26/17         Plan - 09/09/17 0935    Clinical Impression Statement  Today's skilled session continued to address activity tolerance and high level balance with no issues reported. Pt is making steady progress toward goals and should benefit from continued PT to progress toward unmet goals.     Rehab Potential  Good    PT Frequency  Other (comment)    PT Duration  8 weeks    PT Treatment/Interventions  ADLs/Self Care Home Management;Gait training;Stair training;Functional mobility training;Therapeutic activities;Therapeutic exercise;Balance training;Neuromuscular re-education;Patient/family education;Energy conservation    PT Next Visit Plan  balance training and LE strengthening towards LTG's.    Consulted and Agree with Plan of Care  Patient       Patient will benefit from skilled therapeutic intervention in order to improve the following deficits and impairments:  Decreased activity tolerance, Decreased balance, Decreased endurance, Decreased strength  Visit Diagnosis: Unsteadiness on feet  Muscle weakness (generalized)  Hemiplegia and hemiparesis following nontraumatic intracerebral hemorrhage affecting left non-dominant side (HCC)  Other symptoms and signs involving the nervous system     Problem List Patient Active Problem List   Diagnosis Date Noted  . Essential hypertension 08/26/2017  . Hyperlipidemia 08/26/2017  . Cerebral venous thrombosis 07/13/2017  . Intracerebral hemorrhage 07/10/2017  . ICH (intracerebral hemorrhage) ([place]) 07/10/2017  . Unstable angina ([facility]) 03/12/2013    Willow Ora, PTA, [facility] [address] [place], [place] [address] [phone] 09/10/17, 4:50 PM   Name: ESTON HESLIN MRN: [id number] Date of Birth: April 17, 1946

## 2017-09-11 ENCOUNTER — Encounter: Payer: Self-pay | Admitting: Neurology

## 2017-09-16 ENCOUNTER — Ambulatory Visit: Payer: Medicare Other | Admitting: Physical Therapy

## 2017-09-16 ENCOUNTER — Encounter: Payer: Self-pay | Admitting: Physical Therapy

## 2017-09-16 DIAGNOSIS — R29818 Other symptoms and signs involving the nervous system: Secondary | ICD-10-CM

## 2017-09-16 DIAGNOSIS — R2681 Unsteadiness on feet: Secondary | ICD-10-CM

## 2017-09-16 DIAGNOSIS — I69154 Hemiplegia and hemiparesis following nontraumatic intracerebral hemorrhage affecting left non-dominant side: Secondary | ICD-10-CM

## 2017-09-16 DIAGNOSIS — M6281 Muscle weakness (generalized): Secondary | ICD-10-CM

## 2017-09-16 NOTE — Therapy (Signed)
[facility] [address] [place], [place], [address] Phone: [phone]   Fax:  [phone]  Physical Therapy Treatment  Patient Details  Name: Justin Adams MRN: [id number] Date of Birth: 01-20-1946 Referring Provider: Rosalin Hawking, MD   Encounter Date: 09/16/2017  PT End of Session - 09/16/17 0937    Visit Number  11    Number of Visits  13    Date for PT Re-Evaluation  09/26/17    Authorization Type  Medicare and [employer]ARP, Plan F, covered 100%    PT Start Time  0930    PT Stop Time  1015    PT Time Calculation (min)  45 min    Equipment Utilized During Treatment  Gait belt    Activity Tolerance  Patient tolerated treatment well    Behavior During Therapy  [facility] for tasks assessed/performed       Past Medical History:  Diagnosis Date  . Adenomatous polyp   . Cancer [facility]) 2003   prostate  . Hemorrhoids   . Hypercholesteremia   . Hypertension   . Muscular degeneration   . Nephrolithiasis   . Obesity   . Polycythemia   . Shingles   . Sleep apnea     Past Surgical History:  Procedure Laterality Date  . CHOLECYSTECTOMY    . IR ANGIO EXTERNAL CAROTID SEL EXT CAROTID UNI R MOD SED  07/10/2017  . IR ANGIO INTRA EXTRACRAN SEL INTERNAL CAROTID BILAT MOD SED  07/10/2017  . IR ANGIO VERTEBRAL SEL VERTEBRAL BILAT MOD SED  07/10/2017  . LEFT HEART CATHETERIZATION WITH CORONARY ANGIOGRAM N/A 03/15/2013   Procedure: LEFT HEART CATHETERIZATION WITH CORONARY ANGIOGRAM;  Surgeon: Minus Breeding, MD;  Location: [facility] CATH LAB;  Service: Cardiovascular;  Laterality: N/A;  . PROSTATECTOMY    . STONE EXTRACTION WITH BASKET      There were no vitals filed for this visit.  Subjective Assessment - 09/16/17 0933    Subjective  Results of the CT scans were good. Pt has been cleared to get surgery for the kidneys stones in June and advised to wait on colonoscopy until 6 months after the CVA. No falls or pain to report. Does report  having bad side effects from the Elliquis. Plans to discuss this with his Gastro MD in the am and primary in the PM.     Pertinent History  HTN, hyperlipidemia, adenomatous polyp, prostate CA, muscular degeneration, nephrolithiasis, obesity, polycythemia, and shingles    Patient Stated Goals  Pt would like to have less back pain, improved endurance, and increased LE strength to get off of low surfaces     Currently in Pain?  No/denies    Pain Score  0-No pain           [facility] Adult PT Treatment/Exercise - 09/16/17 0938      High Level Balance   High Level Balance Activities  Braiding;Tandem walking tandem/toe/heel walking fwd/bwd    High Level Balance Comments  on both red mats next to counter: 3 laps each with no to light UE touch to counter as needed for balance. min guard assist for balance. pt demo'd good stepping strategies when balance was challenged.       Exercises   Exercises  Other Exercises    Other Exercises   Supine on mat: bridge with arms across chest- 5 sec hold x 10 reps, bridge with alternating leg raises/extending knee with rest between x 10 reps, bridge with hip abd/add with  blue band resistance- 5 sec hold x 10 reps. cues (demo and verbal) needed for ex form/technique with all bridging ex's; quadruped on mat: alternating UE raises, alternating LE raises, combo contralateral UE/LE raises- 10 reps each with min guard assist for balance and cues on ex form/technique; tall kneeling: mini squats x 10 reps, then lateral walking along blue mat edge x 1 lap each way- min guard assist with cues on ex form/technique.                           Knee/Hip Exercises: Aerobic   Tread Mill  on treadmill: lateral walking at 0.4-0.5 mph with bil UE support x 1 minute toward each side, cues on form/posture/step length; backward gait with bil UE support 0.5-0.6 mph with gradual increase in grade up to 2% to simulate walking down hills x 2-3 minutes.            PT Short Term Goals -  08/26/17 0940      PT SHORT TERM GOAL #1   Title  Pt will report performing walking program, HEP and [employer] 2-3 days/week    Baseline  Pt reports doing balance exercises at home with wife's supervision and walking 15"/day twice a day when able     (08-26-17); not going to gym yet per MD's orders    Status  Partially Met      PT SHORT TERM GOAL #2   Title  Pt will improve functional LE strength decreasing five time sit to stand to </= 15 seconds    Baseline  18.38 seconds with UE support;  15.06 secs with bil. UE support from standard chair - 08-26-17    Status  Not Met      PT SHORT TERM GOAL #3   Title  Pt will improve BERG balance to >/= 50/56    Baseline  44/56;  52/56 on 08-26-17    Status  Achieved      PT SHORT TERM GOAL #4   Title  Pt will improve functional endurance on 6 minute walk test by increasing distance by 150'    Baseline  1385 feet; 1417 feet - increased by 32' - improved but not to goal    Status  Not Met        PT Long Term Goals - 07/28/17 1345      PT LONG TERM GOAL #1   Title  Pt will report performing HEP, walking program and [employer] 5/7 days a week    Time  8    Period  Weeks    Status  New    Target Date  09/26/17      PT LONG TERM GOAL #2   Title  Pt will report increase in overall function as indicated by increase in FOTO to >/= 80% by D/C    Baseline  66%    Time  8    Period  Weeks    Status  New    Target Date  09/26/17      PT LONG TERM GOAL #3   Title  Will improve five time sit to stand to </= 13 seconds    Time  8    Period  Weeks    Status  New    Target Date  09/26/17      PT LONG TERM GOAL #4   Title  Will improve BERG score to >/= 53/56 to lower falls risk  Time  8    Period  Weeks    Status  New    Target Date  09/26/17      PT LONG TERM GOAL #5   Title  Will improve 6 minute walk test distance by 250' (from eval)    Baseline  1385 at eval    Time  8    Period  Weeks    Status  New    Target Date   09/26/17            Plan - 09/16/17 4967    Clinical Impression Statement  Today's skilled session continued to focus on strengthening and high level balance without any issues reported. Pt plans to try gym routine (if cleared by Md) prior to next session so he can bring in any questions he may have. Pt is making steady progress toward goals and should benefit from continued PT to progress toward unmet goals.     Rehab Potential  Good    PT Frequency  Other (comment)    PT Duration  8 weeks    PT Treatment/Interventions  ADLs/Self Care Home Management;Gait training;Stair training;Functional mobility training;Therapeutic activities;Therapeutic exercise;Balance training;Neuromuscular re-education;Patient/family education;Energy conservation    PT Next Visit Plan  assess LTGs (09/26/17)    Consulted and Agree with Plan of Care  Patient       Patient will benefit from skilled therapeutic intervention in order to improve the following deficits and impairments:  Decreased activity tolerance, Decreased balance, Decreased endurance, Decreased strength  Visit Diagnosis: Unsteadiness on feet  Muscle weakness (generalized)  Other symptoms and signs involving the nervous system  Hemiplegia and hemiparesis following nontraumatic intracerebral hemorrhage affecting left non-dominant side [facility])     Problem List Patient Active Problem List   Diagnosis Date Noted  . Essential hypertension 08/26/2017  . Hyperlipidemia 08/26/2017  . Cerebral venous thrombosis 07/13/2017  . Intracerebral hemorrhage 07/10/2017  . ICH (intracerebral hemorrhage) ([place]) 07/10/2017  . Unstable angina (HCC) 03/12/2013    Willow Ora, PTA, [employer] [address], [place], [place] [address] [phone] 09/16/17, 10:26 AM   Name: Justin Adams MRN: [id number] Date of Birth: 04/01/1946

## 2017-09-17 ENCOUNTER — Encounter: Payer: Self-pay | Admitting: Neurology

## 2017-09-17 DIAGNOSIS — I10 Essential (primary) hypertension: Secondary | ICD-10-CM | POA: Diagnosis not present

## 2017-09-17 DIAGNOSIS — Z1159 Encounter for screening for other viral diseases: Secondary | ICD-10-CM | POA: Diagnosis not present

## 2017-09-17 DIAGNOSIS — K625 Hemorrhage of anus and rectum: Secondary | ICD-10-CM | POA: Diagnosis not present

## 2017-09-17 DIAGNOSIS — Z79899 Other long term (current) drug therapy: Secondary | ICD-10-CM | POA: Diagnosis not present

## 2017-09-18 DIAGNOSIS — C61 Malignant neoplasm of prostate: Secondary | ICD-10-CM | POA: Insufficient documentation

## 2017-09-18 DIAGNOSIS — Z86010 Personal history of colonic polyps: Secondary | ICD-10-CM | POA: Insufficient documentation

## 2017-09-22 DIAGNOSIS — G4730 Sleep apnea, unspecified: Secondary | ICD-10-CM | POA: Insufficient documentation

## 2017-09-23 ENCOUNTER — Ambulatory Visit: Payer: Medicare Other | Admitting: Physical Therapy

## 2017-09-23 DIAGNOSIS — K635 Polyp of colon: Secondary | ICD-10-CM | POA: Diagnosis not present

## 2017-09-23 DIAGNOSIS — D123 Benign neoplasm of transverse colon: Secondary | ICD-10-CM | POA: Diagnosis not present

## 2017-09-23 DIAGNOSIS — K648 Other hemorrhoids: Secondary | ICD-10-CM | POA: Diagnosis not present

## 2017-09-23 DIAGNOSIS — Z86010 Personal history of colonic polyps: Secondary | ICD-10-CM | POA: Diagnosis not present

## 2017-09-23 DIAGNOSIS — D122 Benign neoplasm of ascending colon: Secondary | ICD-10-CM | POA: Diagnosis not present

## 2017-09-23 DIAGNOSIS — K649 Unspecified hemorrhoids: Secondary | ICD-10-CM | POA: Diagnosis not present

## 2017-09-23 DIAGNOSIS — K625 Hemorrhage of anus and rectum: Secondary | ICD-10-CM | POA: Diagnosis not present

## 2017-09-23 DIAGNOSIS — Z1211 Encounter for screening for malignant neoplasm of colon: Secondary | ICD-10-CM | POA: Diagnosis not present

## 2017-09-23 DIAGNOSIS — K621 Rectal polyp: Secondary | ICD-10-CM | POA: Diagnosis not present

## 2017-09-23 DIAGNOSIS — D128 Benign neoplasm of rectum: Secondary | ICD-10-CM | POA: Diagnosis not present

## 2017-09-25 ENCOUNTER — Ambulatory Visit: Payer: Medicare Other

## 2017-09-25 DIAGNOSIS — R2681 Unsteadiness on feet: Secondary | ICD-10-CM

## 2017-09-25 DIAGNOSIS — M6281 Muscle weakness (generalized): Secondary | ICD-10-CM | POA: Diagnosis not present

## 2017-09-25 DIAGNOSIS — I69154 Hemiplegia and hemiparesis following nontraumatic intracerebral hemorrhage affecting left non-dominant side: Secondary | ICD-10-CM | POA: Diagnosis not present

## 2017-09-25 DIAGNOSIS — R29818 Other symptoms and signs involving the nervous system: Secondary | ICD-10-CM

## 2017-09-25 NOTE — Therapy (Signed)
[facility] [address] [place], [place], [address] Phone: [phone]   Fax:  [phone]  Physical Therapy Treatment  Patient Details  Name: Justin Adams MRN: [id number] Date of Birth: [birth year] Referring Provider: Rosalin Hawking, MD   Encounter Date: 09/25/2017  PT End of Session - 09/25/17 1015    Visit Number  12    Number of Visits  13    Date for PT Re-Evaluation  09/26/17    Authorization Type  Medicare and AARP, Plan F, covered 100%    PT Start Time  0933    PT Stop Time  1015    PT Time Calculation (min)  42 min    Equipment Utilized During Treatment  Gait belt    Activity Tolerance  Patient tolerated treatment well    Behavior During Therapy  [facility] for tasks assessed/performed       Past Medical History:  Diagnosis Date  . Adenomatous polyp   . Cancer [facility]) 2003   prostate  . Hemorrhoids   . Hypercholesteremia   . Hypertension   . Muscular degeneration   . Nephrolithiasis   . Obesity   . Polycythemia   . Shingles   . Sleep apnea     Past Surgical History:  Procedure Laterality Date  . CHOLECYSTECTOMY    . IR ANGIO EXTERNAL CAROTID SEL EXT CAROTID UNI R MOD SED  07/10/2017  . IR ANGIO INTRA EXTRACRAN SEL INTERNAL CAROTID BILAT MOD SED  07/10/2017  . IR ANGIO VERTEBRAL SEL VERTEBRAL BILAT MOD SED  07/10/2017  . LEFT HEART CATHETERIZATION WITH CORONARY ANGIOGRAM N/A 03/15/2013   Procedure: LEFT HEART CATHETERIZATION WITH CORONARY ANGIOGRAM;  Surgeon: Minus Breeding, MD;  Location: [facility] CATH LAB;  Service: Cardiovascular;  Laterality: N/A;  . PROSTATECTOMY    . STONE EXTRACTION WITH BASKET      There were no vitals filed for this visit.  Subjective Assessment - 09/25/17 0935    Subjective  Pt reports no fall since last session. pt reports feeling sensitive due to a hemroid.     Pertinent History  HTN, hyperlipidemia, adenomatous polyp, prostate CA, muscular degeneration, nephrolithiasis,  obesity, polycythemia, and shingles    Patient Stated Goals  Pt would like to have less back pain, improved endurance, and increased LE strength to get off of low surfaces     Currently in Pain?  No/denies         [facility] PT Assessment - 09/25/17 0935      Ambulation/Gait   Ambulation/Gait  Yes    Ambulation/Gait Assistance  7: Independent    Ambulation Distance (Feet)  1502 Feet    Assistive device  None    Gait Pattern  Step-through pattern    Ambulation Surface  Level;Indoor      6 Minute Walk- Baseline   6 Minute Walk- Baseline  no      6 Minute walk- Post Test   6 Minute Walk Post Test  yes    BP (mmHg)  128/70    HR (bpm)  103    02 Sat (%RA)  99 %      6 minute walk test results    Aerobic Endurance Distance Walked  1502      Berg Balance Test   Sit to Stand  Able to stand without using hands and stabilize independently    Standing Unsupported  Able to stand safely 2 minutes    Sitting with Back Unsupported but Feet Supported on  Floor or Stool  Able to sit safely and securely 2 minutes    Stand to Sit  Sits safely with minimal use of hands    Transfers  Able to transfer safely, minor use of hands    Standing Unsupported with Eyes Closed  Able to stand 10 seconds safely    Standing Ubsupported with Feet Together  Able to place feet together independently and stand 1 minute safely    From Standing, Reach Forward with Outstretched Arm  Can reach confidently >25 cm (10")    From Standing Position, Pick up Object from Floor  Able to pick up shoe safely and easily    From Standing Position, Turn to Look Behind Over each Shoulder  Looks behind from both sides and weight shifts well    Turn 360 Degrees  Able to turn 360 degrees safely in 4 seconds or less    Standing Unsupported, Alternately Place Feet on Step/Stool  Able to stand independently and safely and complete 8 steps in 20 seconds    Standing Unsupported, One Foot in Front  Able to place foot tandem independently and  hold 30 seconds    Standing on One Leg  Able to lift leg independently and hold > 10 seconds    Total Score  56    Berg comment:  low falls risk                    [facility] Adult PT Treatment/Exercise - 09/25/17 0935      Transfers   Transfers  Sit to Stand;Stand to Sit    Sit to Stand  6: Modified independent (Device/Increase time)    Five time sit to stand comments   13.00    Stand to Sit  6: Modified independent (Device/Increase time)      Ambulation/Gait   Gait Comments  minimal fatigue after 6 min walk             PT Education - 09/25/17 1011    Education provided  Yes    Education Details  D/C from PT plan of care, walking program/ fitness plan.    Person(s) Educated  Patient    Methods  Explanation    Comprehension  Verbalized understanding       PT Short Term Goals - 08/26/17 0940      PT SHORT TERM GOAL #1   Title  Pt will report performing walking program, HEP and [employer] 2-3 days/week    Baseline  Pt reports doing balance exercises at home with wife's supervision and walking 15"/day twice a day when able     (08-26-17); not going to gym yet per MD's orders    Status  Partially Met      PT SHORT TERM GOAL #2   Title  Pt will improve functional LE strength decreasing five time sit to stand to </= 15 seconds    Baseline  18.38 seconds with UE support;  15.06 secs with bil. UE support from standard chair - 08-26-17    Status  Not Met      PT SHORT TERM GOAL #3   Title  Pt will improve BERG balance to >/= 50/56    Baseline  44/56;  52/56 on 08-26-17    Status  Achieved      PT SHORT TERM GOAL #4   Title  Pt will improve functional endurance on 6 minute walk test by increasing distance by 150'    Baseline  1385 feet;  1417 feet - increased by 32' - improved but not to goal    Status  Not Met        PT Long Term Goals - 09/25/17 1443      PT LONG TERM GOAL #1   Title  Pt will report performing HEP, walking program and [employer] 5/7  days a week    Baseline  09/25/2017 MET pt reports continueing his fitness program and has only lately been limited due to colonoscopy dyscomfort    Time  8    Period  Weeks    Status  Achieved      PT LONG TERM GOAL #2   Title  Pt will report increase in overall function as indicated by increase in FOTO to >/= 80% by D/C    Baseline  09/25/2017 MET 86%    Time  8    Period  Weeks    Status  Achieved      PT LONG TERM GOAL #3   Title  Will improve five time sit to stand to </= 13 seconds    Baseline  09/25/2017 MET 13.00 sec    Time  8    Period  Weeks    Status  Achieved      PT LONG TERM GOAL #4   Title  Will improve BERG score to >/= 53/56 to lower falls risk    Baseline  09/25/2017 MET Berg 56/56    Time  8    Period  Weeks    Status  Achieved      PT LONG TERM GOAL #5   Title  Will improve 6 minute walk test distance by 250' (from eval)    Baseline  09/25/2017 P MET 1385 at eval and 1502 at D/C 117 is significant     Time  8    Period  Weeks    Status  Partially Met           Plan - 09/25/17 1448    Clinical Impression Statement  During today's PT session PT and pt discuss, and agree on, pt D/C from PT plan of care. Pt Met goals 1-4 and partially met Goal 5(although 6 min walk test distance difference of 117 is significant). Pt will continue to benefit from consistent physical activity to further progress his muscular and cardiovascular endurance.     Rehab Potential  Good    PT Next Visit Plan  D/C    Consulted and Agree with Plan of Care  Patient          Patient will benefit from skilled therapeutic intervention in order to improve the following deficits and impairments:     Visit Diagnosis: Unsteadiness on feet  Muscle weakness (generalized)  Other symptoms and signs involving the nervous system  Hemiplegia and hemiparesis following nontraumatic intracerebral hemorrhage affecting left non-dominant side [facility])     Problem List Patient Active Problem  List   Diagnosis Date Noted  . Essential hypertension 08/26/2017  . Hyperlipidemia 08/26/2017  . Cerebral venous thrombosis 07/13/2017  . Intracerebral hemorrhage 07/10/2017  . ICH (intracerebral hemorrhage) ([place]) 07/10/2017  . Unstable angina ([place]) 03/12/2013    PHYSICAL THERAPY DISCHARGE SUMMARY  Visits from Start of Care: 12  Current functional level related to goals / functional outcomes: See above   Remaining deficits: See above   Education / Equipment: Falls risk reduction, benefits of physical activity, progress during PT, Maintaining physical activity to maintain gains made during PT  Plan: Patient  agrees to discharge.  Patient goals were met. Patient is being discharged due to being pleased with the current functional level.  ?????       Waunita Schooner  SPT 09/25/2017, 3:04 PM  [facility] [address] [place], [place], [address] Phone: [phone]   Fax:  [phone]  Name: Justin Adams MRN: [id number] Date of Birth: 05-17-1946

## 2017-10-01 DIAGNOSIS — G4733 Obstructive sleep apnea (adult) (pediatric): Secondary | ICD-10-CM | POA: Diagnosis not present

## 2017-10-08 DIAGNOSIS — K648 Other hemorrhoids: Secondary | ICD-10-CM | POA: Diagnosis not present

## 2017-10-08 HISTORY — DX: Other hemorrhoids: K64.8

## 2017-10-09 DIAGNOSIS — K648 Other hemorrhoids: Secondary | ICD-10-CM | POA: Insufficient documentation

## 2017-11-10 DIAGNOSIS — C61 Malignant neoplasm of prostate: Secondary | ICD-10-CM | POA: Diagnosis not present

## 2017-11-10 DIAGNOSIS — N393 Stress incontinence (female) (male): Secondary | ICD-10-CM | POA: Diagnosis not present

## 2017-11-10 DIAGNOSIS — N200 Calculus of kidney: Secondary | ICD-10-CM | POA: Diagnosis not present

## 2017-11-13 ENCOUNTER — Other Ambulatory Visit: Payer: Self-pay | Admitting: Urology

## 2017-11-13 DIAGNOSIS — N200 Calculus of kidney: Secondary | ICD-10-CM

## 2017-11-19 ENCOUNTER — Other Ambulatory Visit: Payer: Self-pay | Admitting: Neurology

## 2017-11-20 ENCOUNTER — Telehealth: Payer: Self-pay

## 2017-11-20 NOTE — Telephone Encounter (Signed)
Clearance form fax to [facility] at [phone]. Form fax twice,and confirmed.

## 2017-11-20 NOTE — Telephone Encounter (Signed)
Per clearance from patient needs to hold Eliquis 48 hours prior to procedure or surgery. Pt may hold with a small but acceptable peri procedure risk of TIA or stroke.Pt can resume eliquis after the procedure when safe.The clearance form was done by Venancio Poisson NP.

## 2017-11-24 DIAGNOSIS — G4733 Obstructive sleep apnea (adult) (pediatric): Secondary | ICD-10-CM | POA: Diagnosis not present

## 2017-11-24 DIAGNOSIS — I10 Essential (primary) hypertension: Secondary | ICD-10-CM | POA: Diagnosis not present

## 2017-11-26 ENCOUNTER — Encounter: Payer: Self-pay | Admitting: Adult Health

## 2017-11-26 ENCOUNTER — Ambulatory Visit (INDEPENDENT_AMBULATORY_CARE_PROVIDER_SITE_OTHER): Payer: Medicare Other | Admitting: Adult Health

## 2017-11-26 VITALS — BP 102/63 | HR 55 | Ht 74.0 in | Wt 240.4 lb

## 2017-11-26 DIAGNOSIS — I611 Nontraumatic intracerebral hemorrhage in hemisphere, cortical: Secondary | ICD-10-CM

## 2017-11-26 DIAGNOSIS — E785 Hyperlipidemia, unspecified: Secondary | ICD-10-CM | POA: Diagnosis not present

## 2017-11-26 DIAGNOSIS — I10 Essential (primary) hypertension: Secondary | ICD-10-CM | POA: Diagnosis not present

## 2017-11-26 NOTE — Progress Notes (Signed)
STROKE NEUROLOGY FOLLOW UP NOTE  NAME: Justin Adams DOB: Apr 01, 1946  REASON FOR VISIT: stroke follow up HISTORY FROM: pt and chart  Today we had the pleasure of seeing Justin Adams in follow-up at our Neurology Clinic. Pt was accompanied by wife.   History Summary Justin Adams is a 72 y.o. male with history of prostate cancer, HTN, HLD, polycythemia, OSA admitted on 07/13/17 for right frontal headache and nausea. No tPA given due to [place]. MRI showed right temporal ICH with likely transverse sinus thrombosis. MRA showed no AVM or aneurysm. MRV confirmed right transverse sinus, sigmoid sinus and jugular vein thrombosis. Also had DSA confirmed above findings. EF 55% and pan CT no malignancy but left kidney stone with hydronephrosis. LDL 103 and A1C 5.3. He was put on IV heparin and then transitioned to lovenox and then eliquis after stabilization. HA resolved on discharge. Also lipitor 20mg  on discharge with outpt PT/OT.   08/26/17 visit Dr. Erlinda Hong: During the interval time, the patient has been doing well. HA resolved. Followed with urologist due to intermittent gross hematuria.  Had cystoscopy which was unremarkable. UA still showing microscopic hematuria. Urology considered it was caused by large left renal calculi and is on eliquis. Recommend to follow up in 3 months, if neuro stable, may consider uretheroscopic management of renal calculi. pt stated that his mom had LE DVT at older age and brother had TIA in the past. BP at home 120s but today in clinic 92/58, asymptomatic.   11/26/17 UPDATE: Patient returns today for 85-month follow-up.  He denies new or worsening headaches and states they have completely resolved.  He continues to take Eliquis but did have recent concern about rectal bleeding but did have colonoscopy done and stated they placed a "foam gel" on some hemorrhoids and he has not had bleeding since.  Continues to have minimal amount of hematuria but is being followed  by urologist.  On 12/18/2017 he will be undergoing nephrolithotomy percutaneous procedure due to large kidney stones the patient has many concerns regarding this and is unsure if he will follow through with the procedure.  He does have appointment on 12/12/2017 with urologist prior to procedure and it was recommended to ask his questions and voiced concerns at this appointment.  He continues to take Lipitor without side effects myalgias.  Blood pressure today mildly low at 99/55 but does check this at home and this is slightly lower than at home levels but patient is following with PCP in regards to this and PCP is is managing antihypertensives.  Patient also has concerns of abdominal cramps that have been occurring for the past 2 weeks after he eats but will subside after having a bowel movement.  Patient did have a trip planned for cruise but would have to fly to [place] and highly recommended to hold off on this trip due to excessive traveling.  Patient voiced understanding. Denies new or worsening stroke/TIA symptoms at this time.    REVIEW OF SYSTEMS: Full 14 system review of systems performed and notable only for those listed below and in HPI above, all others are negative: Blood in urine and abdominal cramping   The following represents the patient's updated allergies and side effects list: No Known Allergies  The neurologically relevant items on the patient's problem list were reviewed on today's visit.  Neurologic Examination  A problem focused neurological exam (12 or more points of the single system neurologic examination, vital signs counts as 1  point, cranial nerves count for 8 points) was performed.  Blood pressure 102/63, pulse (!) 55, height 6\' 2"  (1.88 m), weight 240 lb 6.4 oz (109 kg).  General - Well nourished, well developed, elderly Caucasian male, in no apparent distress.  Ophthalmologic - Fundi not visualized due to small pupils.  Cardiovascular - Regular rate and rhythm with  no murmur.  Mental Status -  Level of arousal and orientation to time, place, and person were intact. Language including expression, naming, repetition, comprehension was assessed and found intact. Attention span and concentration were normal. Fund of Knowledge was assessed and was intact.  Cranial Nerves II - XII - II - Visual field intact OU. III, IV, VI - Extraocular movements intact. V - Facial sensation intact bilaterally. VII - Facial movement intact bilaterally. VIII - Hearing & vestibular intact bilaterally. X - Palate elevates symmetrically. XI - Chin turning & shoulder shrug intact bilaterally. XII - Tongue protrusion intact.  Motor Strength - The patient's strength was normal in all extremities and pronator drift was absent.  Bulk was normal and fasciculations were absent.   Motor Tone - Muscle tone was assessed at the neck and appendages and was normal.  Reflexes - The patient's reflexes were 1+ in all extremities and he had no pathological reflexes.  Sensory - Light touch, temperature/pinprick, vibration and proprioception, and Romberg testing were assessed and were normal.    Coordination - The patient had normal movements in the hands and feet with no ataxia or dysmetria.  Tremor was absent.  Gait and Station - The patient's transfers, posture, gait, station, and turns were observed as normal.      Data reviewed: I personally reviewed the images and agree with the radiology interpretations.  CT VENOGRAM HEAD 09/05/2017 IMPRESSION: 1. Interval resolution of right temporal lobe intraparenchymal hematoma with residual encephalomalacia. 2. Otherwise stable appearance of the brain. No other acute intracranial abnormality identified. 3. Interval improvement and/or partial recanalization of right-sided dural venous thrombosis, with persistent nonocclusive thrombus within the right transverse and sigmoid sinuses as well as the proximal right internal jugular  vein.     Assessment: As you may recall, he is a 72 y.o. Caucasian male with PMH of prostate cancer, HTN, HLD, polycythemia, OSA admitted on 07/13/17 for right frontal headache and nausea. MRI showed right temporal ICH with likely transverse sinus thrombosis. MRA showed no AVM or aneurysm. MRV confirmed right transverse sinus, sigmoid sinus and jugular vein thrombosis. Also had DSA confirmed above findings. EF 55% and pan CT no malignancy but left kidney stone with hydronephrosis. LDL 103 and A1C 5.3. He was put on IV heparin and then transitioned to lovenox and then eliquis after stabilization. HA resolved and d/c on eliquis and lipitor. HA resolved. Followed with urologist due to intermittent gross hematuria.  Had cystoscopy which was unremarkable, considered it was caused by large left renal calculi and is on eliquis. Recommend to follow up in 3 months, if neuro stable, may consider uretheroscopic management of renal calculi. Due to CSVT unprovoked, critical area and low bleeding risk, would recommend eliquis life time according to recent DVT guidelines.  Patient returns today for follow-up visit.  Plan:  - continue eliquis and lpitor for thrombosis prevention -Recommended probiotic for cramping after eating and to follow-up with PCP if this does not resolve - follow up with urologist for kidney stone management. OK to stop eliquis 48 hrs before the surgery and resume once hemodynamically stable -12/18/2017 will be undergoing nephrolithotomy percutaneous procedure -  Okay to return to gym lifting minimal weights in moderation - Follow up with your primary care physician for stroke risk factor modification. Recommend maintain blood pressure goal <130/80, diabetes with hemoglobin A1c goal below 7.0% and lipids with LDL cholesterol goal below 70 mg/dL.  - healthy diet and regular exercise   follow up in 6 months or call earlier if needed  I spent more than 25 minutes of face to face time with the  patient. Greater than 50% of time was spent in counseling and coordination of care. We discussed medication duration, repeat CT and follow up with urologist.   Justin Adams, AGNP-BC  [facility] [address] [place], [place] [address]  Phone [phone] Fax [phone]

## 2017-11-26 NOTE — Progress Notes (Signed)
I agree with the above plan 

## 2017-11-26 NOTE — Patient Instructions (Signed)
Continue Eliquis (apixaban) daily  and lipitor  for secondary stroke prevention  Continue to follow up with PCP regarding cholesterol and blood pressure management   Try probiotics for gut health and help with cramping after eating  Continue to monitor blood pressure at home  Maintain strict control of hypertension with blood pressure goal below 130/90, diabetes with hemoglobin A1c goal below 6.5% and cholesterol with LDL cholesterol (bad cholesterol) goal below 70 mg/dL. I also advised the patient to eat a healthy diet with plenty of whole grains, cereals, fruits and vegetables, exercise regularly and maintain ideal body weight.  Followup in the future with me in 6 months or call earlier if needed         Thank you for coming to see [place] at [facility]. I hope we have been able to provide you high quality care today.  You may receive a patient satisfaction survey over the next few weeks. We would appreciate your feedback and comments so that we may continue to improve ourselves and the health of our patients.

## 2017-11-29 ENCOUNTER — Other Ambulatory Visit: Payer: Self-pay | Admitting: Neurology

## 2017-12-12 ENCOUNTER — Other Ambulatory Visit (HOSPITAL_COMMUNITY): Payer: Medicare Other

## 2017-12-12 DIAGNOSIS — N393 Stress incontinence (female) (male): Secondary | ICD-10-CM | POA: Diagnosis not present

## 2017-12-12 DIAGNOSIS — N200 Calculus of kidney: Secondary | ICD-10-CM | POA: Diagnosis not present

## 2017-12-12 DIAGNOSIS — C61 Malignant neoplasm of prostate: Secondary | ICD-10-CM | POA: Diagnosis not present

## 2017-12-18 ENCOUNTER — Other Ambulatory Visit (HOSPITAL_COMMUNITY): Payer: Medicare Other

## 2017-12-18 ENCOUNTER — Ambulatory Visit (HOSPITAL_COMMUNITY): Admission: RE | Admit: 2017-12-18 | Payer: Medicare Other | Source: Ambulatory Visit | Admitting: Urology

## 2017-12-18 ENCOUNTER — Ambulatory Visit (HOSPITAL_COMMUNITY): Payer: Medicare Other

## 2017-12-18 ENCOUNTER — Encounter (HOSPITAL_COMMUNITY): Admission: RE | Payer: Self-pay | Source: Ambulatory Visit

## 2017-12-18 SURGERY — NEPHROLITHOTOMY PERCUTANEOUS
Anesthesia: General | Laterality: Left

## 2017-12-19 ENCOUNTER — Other Ambulatory Visit: Payer: Self-pay | Admitting: Urology

## 2017-12-22 ENCOUNTER — Encounter: Payer: Self-pay | Admitting: Neurology

## 2018-01-21 DIAGNOSIS — I10 Essential (primary) hypertension: Secondary | ICD-10-CM | POA: Diagnosis not present

## 2018-01-21 DIAGNOSIS — Z79899 Other long term (current) drug therapy: Secondary | ICD-10-CM | POA: Diagnosis not present

## 2018-01-21 DIAGNOSIS — E7800 Pure hypercholesterolemia, unspecified: Secondary | ICD-10-CM | POA: Diagnosis not present

## 2018-01-23 DIAGNOSIS — H52203 Unspecified astigmatism, bilateral: Secondary | ICD-10-CM | POA: Diagnosis not present

## 2018-01-23 DIAGNOSIS — H43813 Vitreous degeneration, bilateral: Secondary | ICD-10-CM | POA: Diagnosis not present

## 2018-01-23 DIAGNOSIS — H33302 Unspecified retinal break, left eye: Secondary | ICD-10-CM | POA: Diagnosis not present

## 2018-01-23 DIAGNOSIS — H353132 Nonexudative age-related macular degeneration, bilateral, intermediate dry stage: Secondary | ICD-10-CM | POA: Diagnosis not present

## 2018-01-27 DIAGNOSIS — Z6841 Body Mass Index (BMI) 40.0 and over, adult: Secondary | ICD-10-CM | POA: Diagnosis not present

## 2018-01-27 DIAGNOSIS — Z0000 Encounter for general adult medical examination without abnormal findings: Secondary | ICD-10-CM | POA: Diagnosis not present

## 2018-01-27 DIAGNOSIS — G4733 Obstructive sleep apnea (adult) (pediatric): Secondary | ICD-10-CM | POA: Diagnosis not present

## 2018-01-27 DIAGNOSIS — Z1389 Encounter for screening for other disorder: Secondary | ICD-10-CM | POA: Diagnosis not present

## 2018-01-27 DIAGNOSIS — Z79899 Other long term (current) drug therapy: Secondary | ICD-10-CM | POA: Diagnosis not present

## 2018-01-27 DIAGNOSIS — E7800 Pure hypercholesterolemia, unspecified: Secondary | ICD-10-CM | POA: Diagnosis not present

## 2018-01-27 DIAGNOSIS — E669 Obesity, unspecified: Secondary | ICD-10-CM | POA: Diagnosis not present

## 2018-02-26 DIAGNOSIS — N200 Calculus of kidney: Secondary | ICD-10-CM | POA: Diagnosis not present

## 2018-03-02 ENCOUNTER — Encounter (HOSPITAL_COMMUNITY): Payer: Self-pay

## 2018-03-02 NOTE — Pre-Procedure Instructions (Signed)
Left message with Justin Adams to request a copy of surgical clearance.

## 2018-03-02 NOTE — Patient Instructions (Addendum)
Your procedure is scheduled on: Thursday, Oct. 3, 2019   Surgery Time:  12:15PM-2:45PM   Report to [facility] Main  Entrance    Report to admitting at 7:30 AM   Call this number if you have problems the morning of surgery [phone]   Bring CPAP mask and tubing day of surgery   Do not eat food or drink liquids :After Midnight.   Brush your teeth the morning of surgery.   Do NOT smoke after Midnight   Take these medicines the morning of surgery with A SIP OF WATER: Atorvastatin                               You may not have any metal on your body including  jewelry, and body piercings             Do not wear lotions, powders, perfumes/cologne, or deodorant                          Men may shave face and neck.   Do not bring valuables to the hospital. [place].   Contacts, dentures or bridgework may not be worn into surgery.   Leave suitcase in the car. After surgery it may be brought to your room.    Special Instructions: Bring a copy of your healthcare power of attorney and living will documents         the day of surgery if you haven't scanned them in before.              Please read over the following fact sheets you were given:  [facility] - Preparing for Surgery Before surgery, you can play an important role.  Because skin is not sterile, your skin needs to be as free of germs as possible.  You can reduce the number of germs on your skin by washing with CHG (chlorahexidine gluconate) soap before surgery.  CHG is an antiseptic cleaner which kills germs and bonds with the skin to continue killing germs even after washing. Please DO NOT use if you have an allergy to CHG or antibacterial soaps.  If your skin becomes reddened/irritated stop using the CHG and inform your nurse when you arrive at Short Stay. Do not shave (including legs and underarms) for at least 48 hours prior to the first CHG shower.  You may  shave your face/neck.  Please follow these instructions carefully:  1.  Shower with CHG Soap the night before surgery and the  morning of surgery.  2.  If you choose to wash your hair, wash your hair first as usual with your normal  shampoo.  3.  After you shampoo, rinse your hair and body thoroughly to remove the shampoo.                             4.  Use CHG as you would any other liquid soap.  You can apply chg directly to the skin and wash.  Gently with a scrungie or clean washcloth.  5.  Apply the CHG Soap to your body ONLY FROM THE NECK DOWN.   Do   not use on face/ open  Wound or open sores. Avoid contact with eyes, ears mouth and   genitals (private parts).                       Wash face,  Genitals (private parts) with your normal soap.             6.  Wash thoroughly, paying special attention to the area where your    surgery  will be performed.  7.  Thoroughly rinse your body with warm water from the neck down.  8.  DO NOT shower/wash with your normal soap after using and rinsing off the CHG Soap.                9.  Pat yourself dry with a clean towel.            10.  Wear clean pajamas.            11.  Place clean sheets on your bed the night of your first shower and do not  sleep with pets. Day of Surgery : Do not apply any lotions/deodorants the morning of surgery.  Please wear clean clothes to the hospital/surgery center.  FAILURE TO FOLLOW THESE INSTRUCTIONS MAY RESULT IN THE CANCELLATION OF YOUR SURGERY  PATIENT SIGNATURE_________________________________  NURSE SIGNATURE__________________________________  ________________________________________________________________________

## 2018-03-02 NOTE — Pre-Procedure Instructions (Signed)
EKG 06/25/17 in epic

## 2018-03-02 NOTE — Pre-Procedure Instructions (Addendum)
The following are in epic: Surgical clearance and last office visit note 11/26/17 [place] EKG 07/09/17 ECHO 07/11/17  Cardiac clearance from [facility] also on chart.

## 2018-03-05 ENCOUNTER — Encounter (HOSPITAL_COMMUNITY): Payer: Self-pay

## 2018-03-05 ENCOUNTER — Encounter (HOSPITAL_COMMUNITY)
Admission: RE | Admit: 2018-03-05 | Discharge: 2018-03-05 | Disposition: A | Discharge status: Home / Self Care | Payer: Medicare Other | Source: Ambulatory Visit | Attending: Urology | Admitting: Urology

## 2018-03-05 ENCOUNTER — Other Ambulatory Visit: Payer: Self-pay

## 2018-03-05 DIAGNOSIS — Z01818 Encounter for other preprocedural examination: Secondary | ICD-10-CM | POA: Insufficient documentation

## 2018-03-05 DIAGNOSIS — N200 Calculus of kidney: Secondary | ICD-10-CM | POA: Diagnosis not present

## 2018-03-05 HISTORY — DX: Obstructive sleep apnea (adult) (pediatric): G47.33

## 2018-03-05 HISTORY — DX: Malignant neoplasm of prostate: C61

## 2018-03-05 HISTORY — DX: Nontraumatic intracranial hemorrhage, unspecified: I62.9

## 2018-03-05 HISTORY — DX: Intracranial and intraspinal phlebitis and thrombophlebitis: G08

## 2018-03-05 HISTORY — DX: Cerebral infarction, unspecified: I63.9

## 2018-03-05 HISTORY — DX: Calculus of ureter: N20.1

## 2018-03-05 HISTORY — DX: Heart disease, unspecified: I51.9

## 2018-03-05 HISTORY — DX: Personal history of urinary calculi: Z87.442

## 2018-03-05 HISTORY — DX: Cardiomegaly: I51.7

## 2018-03-05 HISTORY — DX: Other hemorrhoids: K64.8

## 2018-03-05 HISTORY — DX: Unsteadiness on feet: R26.81

## 2018-03-05 LAB — BASIC METABOLIC PANEL
ANION GAP: 6 (ref 5–15)
BUN: 20 mg/dL (ref 8–23)
CALCIUM: 8.6 mg/dL — AB (ref 8.9–10.3)
CO2: 27 mmol/L (ref 22–32)
Chloride: 108 mmol/L (ref 98–111)
Creatinine, Ser: 0.75 mg/dL (ref 0.61–1.24)
GFR calc Af Amer: >60 mL/min (ref >60)
GFR calc non Af Amer: >60 mL/min (ref >60)
GLUCOSE: 95 mg/dL (ref 70–99)
Potassium: 4.2 mmol/L (ref 3.5–5.1)
Sodium: 141 mmol/L (ref 135–145)

## 2018-03-05 LAB — CBC
HEMATOCRIT: 47.7 % (ref 39.0–52.0)
HEMOGLOBIN: 15.6 g/dL (ref 13.0–17.0)
MCH: 28.7 pg (ref 26.0–34.0)
MCHC: 32.7 g/dL (ref 30.0–36.0)
MCV: 87.7 fL (ref 78.0–100.0)
Platelets: 167 10*3/uL (ref 150–400)
RBC: 5.44 MIL/uL (ref 4.22–5.81)
RDW: 14 % (ref 11.5–15.5)
WBC: 4.9 10*3/uL (ref 4.0–10.5)

## 2018-03-05 LAB — ABO/RH: ABO/RH(D): B POS

## 2018-03-11 ENCOUNTER — Other Ambulatory Visit: Payer: Self-pay | Admitting: Radiology

## 2018-03-11 NOTE — H&P (Signed)
H&P  Chief Complaint: gross hematuria, left renal calculi  History of Present Illness: 72 year old male presents at this time for percutaneous nephrolithotomy for 2 left renal calculi, measured at 13 and 9 mm in size on CT scan done earlier in 2019.  He has a long history of microscopic hematuria and developed gross hematuria following treatment with Eliquis earlier this year.  Hematuria evaluation revealed normal cystoscopy, CT revealed a 13 and 9 mm left renal stone with left renal atrophy and a large left renal pelvis.  It is felt that this hematuria is coming from his calculous disease.  I discussed treatment options, including ureteroscopy, lithotripsy and direct percutaneous management of these.  As the patient has capacious renal pelvis, and decreased chance of passing stone fragments, it was not felt shockwave lithotripsy was an adequate treatment.  Because of the size of the stones and the left hydronephrosis, I felt percutaneous management would be best.  I discussed procedure as well as risks and complications with the patient.  These include but are not limited to infection, bleeding, loss of kidney/function, anesthetic complication, among others.  He understands these and desires to proceed.  Past Medical History:  Diagnosis Date  . Adenomatous polyp   . Bleeding internal hemorrhoids 10/2017  . Cerebral venous thrombosis   . Grade I diastolic dysfunction 38/03/1750   noted on ECHO   . Hemorrhoids   . History of kidney stones   . Hypercholesteremia   . Hypertension   . Intracranial hemorrhage (HCC)    Right temporal lobe  . LVH (left ventricular hypertrophy) 07/11/2017   Mild, noted on ECHO   . Muscular degeneration   . Nephrolithiasis   . Obesity   . OSA (obstructive sleep apnea)   . Polycythemia   . Prostate cancer ([place]) 2003  . Shingles   . Stroke ([place]) 06/2017   No residual  . Unsteady gait   . Ureteral calculi 02/12/2008   Left    Past Surgical History:  Procedure  Laterality Date  . APPENDECTOMY     childhood  . CHOLECYSTECTOMY    . COLONOSCOPY    . HEMORRHOID BANDING  2015  . IR ANGIO EXTERNAL CAROTID SEL EXT CAROTID UNI R MOD SED  07/10/2017  . IR ANGIO INTRA EXTRACRAN SEL INTERNAL CAROTID BILAT MOD SED  07/10/2017  . IR ANGIO VERTEBRAL SEL VERTEBRAL BILAT MOD SED  07/10/2017  . LEFT HEART CATHETERIZATION WITH CORONARY ANGIOGRAM N/A 03/15/2013   Procedure: LEFT HEART CATHETERIZATION WITH CORONARY ANGIOGRAM;  Surgeon: Minus Breeding, MD;  Location: [facility] CATH LAB;  Service: Cardiovascular;  Laterality: N/A;  . PROSTATECTOMY    . STONE EXTRACTION WITH BASKET      Home Medications:  Allergies as of 03/11/2018   No Known Allergies     Medication List    Notice   Cannot display discharge medications because the patient has not yet been admitted.     Allergies: No Known Allergies  Family History  Problem Relation Age of Onset  . Hypertension Mother   . Coronary artery disease Mother 93  . Breast cancer Mother   . Coronary artery disease Father 25       Died age 75  . CAD Brother 49    Social History:  reports that he has never smoked. He has never used smokeless tobacco. He reports that he does not drink alcohol or use drugs.  ROS: A complete review of systems was performed.  All systems are negative except for pertinent  findings as noted.  Physical Exam:  Vital signs in last 24 hours:   Constitutional:  Alert and oriented, No acute distress Cardiovascular: Regular rate  Respiratory: Normal respiratory effort GI: Abdomen is soft, nontender, nondistended, no abdominal masses Genitourinary: No CVAT. Normal male phallus, testes are descended bilaterally and non-tender and without masses, scrotum is normal in appearance without lesions or masses, perineum is normal on inspection. Lymphatic: No lymphadenopathy Neurologic: Grossly intact, no focal deficits Psychiatric: Normal mood and affect  Laboratory Data:  No results for input(s):  WBC, HGB, HCT, PLT in the last 72 hours.  No results for input(s): NA, K, CL, GLUCOSE, BUN, CALCIUM, CREATININE in the last 72 hours.  Invalid input(s): CO3   No results found for this or any previous visit (from the past 24 hour(s)). No results found for this or any previous visit (from the past 240 hour(s)).  Renal Function: Recent Labs    03/05/18 0934  CREATININE 0.75   Estimated Creatinine Clearance: 107.7 mL/min (by C-G formula based on SCr of 0.75 mg/dL).  Radiologic Imaging: No results found.  Impression/Assessment:  Left renal calculi  Plan:  Left percutaneous nephrolithotomy

## 2018-03-12 ENCOUNTER — Observation Stay (HOSPITAL_COMMUNITY)
Admission: RE | Admit: 2018-03-12 | Discharge: 2018-03-13 | Disposition: A | Discharge status: Home / Self Care | Payer: Medicare Other | Source: Other Acute Inpatient Hospital | Attending: Urology | Admitting: Urology

## 2018-03-12 ENCOUNTER — Ambulatory Visit (HOSPITAL_COMMUNITY): Payer: Medicare Other

## 2018-03-12 ENCOUNTER — Ambulatory Visit (HOSPITAL_COMMUNITY)
Admission: RE | Admit: 2018-03-12 | Discharge: 2018-03-12 | Disposition: A | Discharge status: Home / Self Care | Payer: Medicare Other | Source: Ambulatory Visit | Attending: Urology | Admitting: Urology

## 2018-03-12 ENCOUNTER — Ambulatory Visit (HOSPITAL_COMMUNITY): Payer: Medicare Other | Admitting: Anesthesiology

## 2018-03-12 ENCOUNTER — Encounter (HOSPITAL_COMMUNITY): Payer: Self-pay | Admitting: *Deleted

## 2018-03-12 ENCOUNTER — Other Ambulatory Visit: Payer: Self-pay

## 2018-03-12 ENCOUNTER — Encounter (HOSPITAL_COMMUNITY)
Admission: RE | Disposition: A | Discharge status: Home / Self Care | Payer: Self-pay | Source: Other Acute Inpatient Hospital | Attending: Urology

## 2018-03-12 DIAGNOSIS — Z8673 Personal history of transient ischemic attack (TIA), and cerebral infarction without residual deficits: Secondary | ICD-10-CM | POA: Insufficient documentation

## 2018-03-12 DIAGNOSIS — E669 Obesity, unspecified: Secondary | ICD-10-CM | POA: Insufficient documentation

## 2018-03-12 DIAGNOSIS — N131 Hydronephrosis with ureteral stricture, not elsewhere classified: Secondary | ICD-10-CM | POA: Insufficient documentation

## 2018-03-12 DIAGNOSIS — Z87442 Personal history of urinary calculi: Secondary | ICD-10-CM | POA: Diagnosis not present

## 2018-03-12 DIAGNOSIS — E7800 Pure hypercholesterolemia, unspecified: Secondary | ICD-10-CM | POA: Insufficient documentation

## 2018-03-12 DIAGNOSIS — N200 Calculus of kidney: Secondary | ICD-10-CM | POA: Diagnosis not present

## 2018-03-12 DIAGNOSIS — Z79899 Other long term (current) drug therapy: Secondary | ICD-10-CM | POA: Diagnosis not present

## 2018-03-12 DIAGNOSIS — I110 Hypertensive heart disease with heart failure: Secondary | ICD-10-CM | POA: Insufficient documentation

## 2018-03-12 DIAGNOSIS — R310 Gross hematuria: Secondary | ICD-10-CM | POA: Diagnosis not present

## 2018-03-12 DIAGNOSIS — Z8546 Personal history of malignant neoplasm of prostate: Secondary | ICD-10-CM | POA: Insufficient documentation

## 2018-03-12 DIAGNOSIS — Z7901 Long term (current) use of anticoagulants: Secondary | ICD-10-CM

## 2018-03-12 DIAGNOSIS — G4733 Obstructive sleep apnea (adult) (pediatric): Secondary | ICD-10-CM | POA: Insufficient documentation

## 2018-03-12 DIAGNOSIS — I10 Essential (primary) hypertension: Secondary | ICD-10-CM | POA: Diagnosis not present

## 2018-03-12 DIAGNOSIS — N132 Hydronephrosis with renal and ureteral calculous obstruction: Secondary | ICD-10-CM | POA: Diagnosis not present

## 2018-03-12 DIAGNOSIS — I509 Heart failure, unspecified: Secondary | ICD-10-CM

## 2018-03-12 DIAGNOSIS — R319 Hematuria, unspecified: Secondary | ICD-10-CM | POA: Diagnosis not present

## 2018-03-12 HISTORY — PX: IR URETERAL STENT LEFT NEW ACCESS W/O SEP NEPHROSTOMY CATH: IMG6075

## 2018-03-12 HISTORY — PX: NEPHROLITHOTOMY: SHX5134

## 2018-03-12 HISTORY — PX: HOLMIUM LASER APPLICATION: SHX5852

## 2018-03-12 LAB — BASIC METABOLIC PANEL
ANION GAP: 7 (ref 5–15)
BUN: 16 mg/dL (ref 8–23)
CALCIUM: 8.9 mg/dL (ref 8.9–10.3)
CO2: 26 mmol/L (ref 22–32)
CREATININE: 0.82 mg/dL (ref 0.61–1.24)
Chloride: 109 mmol/L (ref 98–111)
Glucose, Bld: 82 mg/dL (ref 70–99)
Potassium: 3.9 mmol/L (ref 3.5–5.1)
Sodium: 142 mmol/L (ref 135–145)

## 2018-03-12 LAB — CBC WITH DIFFERENTIAL/PLATELET
BASOS ABS: 0 10*3/uL (ref 0.0–0.1)
BASOS PCT: 0 %
EOS ABS: 0.1 10*3/uL (ref 0.0–0.7)
Eosinophils Relative: 2 %
HEMATOCRIT: 48.6 % (ref 39.0–52.0)
HEMOGLOBIN: 16 g/dL (ref 13.0–17.0)
Lymphocytes Relative: 23 %
Lymphs Abs: 1.2 10*3/uL (ref 0.7–4.0)
MCH: 28.9 pg (ref 26.0–34.0)
MCHC: 32.9 g/dL (ref 30.0–36.0)
MCV: 87.9 fL (ref 78.0–100.0)
Monocytes Absolute: 0.3 10*3/uL (ref 0.1–1.0)
Monocytes Relative: 7 %
NEUTROS PCT: 68 %
Neutro Abs: 3.5 10*3/uL (ref 1.7–7.7)
Platelets: 163 10*3/uL (ref 150–400)
RBC: 5.53 MIL/uL (ref 4.22–5.81)
RDW: 13.9 % (ref 11.5–15.5)
WBC: 5.1 10*3/uL (ref 4.0–10.5)

## 2018-03-12 LAB — TYPE AND SCREEN
ABO/RH(D): B POS
Antibody Screen: NEGATIVE

## 2018-03-12 LAB — PROTIME-INR
INR: 1.01
PROTHROMBIN TIME: 13.2 s (ref 11.4–15.2)

## 2018-03-12 SURGERY — NEPHROLITHOTOMY PERCUTANEOUS
Anesthesia: General | Laterality: Left

## 2018-03-12 MED ORDER — LISINOPRIL 20 MG PO TABS
20.0000 mg | ORAL_TABLET | Freq: Every day | ORAL | Status: DC
  Administered 2018-03-12 – 2018-03-13 (×2): 20 mg via ORAL
  Filled 2018-03-12 (×2): qty 1

## 2018-03-12 MED ORDER — ROCURONIUM BROMIDE 10 MG/ML (PF) SYRINGE
PREFILLED_SYRINGE | INTRAVENOUS | Status: DC | PRN
  Administered 2018-03-12: 20 mg via INTRAVENOUS
  Administered 2018-03-12: 50 mg via INTRAVENOUS

## 2018-03-12 MED ORDER — LIDOCAINE HCL 1 % IJ SOLN
INTRAMUSCULAR | Status: AC
  Filled 2018-03-12: qty 20

## 2018-03-12 MED ORDER — ACETAMINOPHEN 325 MG PO TABS: 650.0000 mg | ORAL_TABLET | ORAL | Status: DC | PRN

## 2018-03-12 MED ORDER — FENTANYL CITRATE (PF) 100 MCG/2ML IJ SOLN: 25.0000 ug | INTRAMUSCULAR | Status: DC | PRN

## 2018-03-12 MED ORDER — PROPOFOL 10 MG/ML IV BOLUS
INTRAVENOUS | Status: DC | PRN
  Administered 2018-03-12: 200 mg via INTRAVENOUS

## 2018-03-12 MED ORDER — FENTANYL CITRATE (PF) 100 MCG/2ML IJ SOLN
INTRAMUSCULAR | Status: AC | PRN
  Administered 2018-03-12 (×2): 50 ug via INTRAVENOUS

## 2018-03-12 MED ORDER — CEFAZOLIN SODIUM-DEXTROSE 2-4 GM/100ML-% IV SOLN
INTRAVENOUS | Status: AC
  Administered 2018-03-12: 2000 mg
  Filled 2018-03-12: qty 100

## 2018-03-12 MED ORDER — CEFAZOLIN SODIUM-DEXTROSE 2-4 GM/100ML-% IV SOLN: 2.0000 g | INTRAVENOUS | Status: DC

## 2018-03-12 MED ORDER — SODIUM CHLORIDE 0.9 % IV SOLN: INTRAVENOUS | Status: DC

## 2018-03-12 MED ORDER — IOPAMIDOL (ISOVUE-300) INJECTION 61%
50.0000 mL | Freq: Once | INTRAVENOUS | Status: AC | PRN
  Administered 2018-03-12: 25 mL

## 2018-03-12 MED ORDER — DEXAMETHASONE SODIUM PHOSPHATE 10 MG/ML IJ SOLN
INTRAMUSCULAR | Status: DC | PRN
  Administered 2018-03-12: 10 mg via INTRAVENOUS

## 2018-03-12 MED ORDER — LIDOCAINE 2% (20 MG/ML) 5 ML SYRINGE
INTRAMUSCULAR | Status: DC | PRN
  Administered 2018-03-12: 100 mg via INTRAVENOUS

## 2018-03-12 MED ORDER — PROMETHAZINE HCL 25 MG/ML IJ SOLN: 6.2500 mg | INTRAMUSCULAR | Status: DC | PRN

## 2018-03-12 MED ORDER — ONDANSETRON HCL 4 MG/2ML IJ SOLN
INTRAMUSCULAR | Status: DC | PRN
  Administered 2018-03-12: 4 mg via INTRAVENOUS

## 2018-03-12 MED ORDER — IOPAMIDOL (ISOVUE-300) INJECTION 61%
INTRAVENOUS | Status: AC
  Administered 2018-03-12: 25 mL
  Filled 2018-03-12: qty 50

## 2018-03-12 MED ORDER — POLYETHYLENE GLYCOL 3350 17 G PO PACK: 17.0000 g | PACK | Freq: Every day | ORAL | Status: DC

## 2018-03-12 MED ORDER — HYDROMORPHONE HCL 1 MG/ML IJ SOLN: 0.5000 mg | INTRAMUSCULAR | Status: DC | PRN

## 2018-03-12 MED ORDER — ZOLPIDEM TARTRATE 5 MG PO TABS: 5.0000 mg | ORAL_TABLET | Freq: Every evening | ORAL | Status: DC | PRN

## 2018-03-12 MED ORDER — SUGAMMADEX SODIUM 200 MG/2ML IV SOLN
INTRAVENOUS | Status: AC
  Filled 2018-03-12: qty 4

## 2018-03-12 MED ORDER — POTASSIUM CITRATE ER 10 MEQ (1080 MG) PO TBCR
30.0000 meq | EXTENDED_RELEASE_TABLET | Freq: Two times a day (BID) | ORAL | Status: DC
  Administered 2018-03-13: 30 meq via ORAL
  Filled 2018-03-12 (×2): qty 3

## 2018-03-12 MED ORDER — OXYBUTYNIN CHLORIDE 5 MG PO TABS: 5.0000 mg | ORAL_TABLET | Freq: Three times a day (TID) | ORAL | Status: DC | PRN

## 2018-03-12 MED ORDER — PROPOFOL 10 MG/ML IV BOLUS
INTRAVENOUS | Status: AC
  Filled 2018-03-12: qty 20

## 2018-03-12 MED ORDER — FENTANYL CITRATE (PF) 100 MCG/2ML IJ SOLN
INTRAMUSCULAR | Status: AC
  Filled 2018-03-12: qty 2

## 2018-03-12 MED ORDER — AMLODIPINE BESYLATE 2.5 MG PO TABS
2.5000 mg | ORAL_TABLET | Freq: Every day | ORAL | Status: DC | PRN
  Filled 2018-03-12: qty 1

## 2018-03-12 MED ORDER — MENTHOL 3 MG MT LOZG
1.0000 | LOZENGE | OROMUCOSAL | Status: DC | PRN
  Administered 2018-03-12: 3 mg via ORAL
  Filled 2018-03-12: qty 9

## 2018-03-12 MED ORDER — MIDAZOLAM HCL 2 MG/2ML IJ SOLN: 0.5000 mg | Freq: Once | INTRAMUSCULAR | Status: DC | PRN

## 2018-03-12 MED ORDER — SUGAMMADEX SODIUM 200 MG/2ML IV SOLN
INTRAVENOUS | Status: DC | PRN
  Administered 2018-03-12: 200 mg via INTRAVENOUS

## 2018-03-12 MED ORDER — ACETAMINOPHEN 500 MG PO TABS: 500.0000 mg | ORAL_TABLET | Freq: Every day | ORAL | Status: DC | PRN

## 2018-03-12 MED ORDER — IOHEXOL 300 MG/ML  SOLN
INTRAMUSCULAR | Status: DC | PRN
  Administered 2018-03-12: 60 mL via URETHRAL

## 2018-03-12 MED ORDER — ONDANSETRON HCL 4 MG/2ML IJ SOLN: 4.0000 mg | INTRAMUSCULAR | Status: DC | PRN

## 2018-03-12 MED ORDER — ATORVASTATIN CALCIUM 20 MG PO TABS
20.0000 mg | ORAL_TABLET | Freq: Every day | ORAL | Status: DC
  Administered 2018-03-13: 20 mg via ORAL
  Filled 2018-03-12: qty 1

## 2018-03-12 MED ORDER — OXYCODONE HCL 5 MG PO TABS: 5.0000 mg | ORAL_TABLET | ORAL | Status: DC | PRN

## 2018-03-12 MED ORDER — SODIUM CHLORIDE 0.45 % IV SOLN
INTRAVENOUS | Status: DC
  Administered 2018-03-12 – 2018-03-13 (×2): via INTRAVENOUS

## 2018-03-12 MED ORDER — EPHEDRINE SULFATE 50 MG/ML IJ SOLN
INTRAMUSCULAR | Status: DC | PRN
  Administered 2018-03-12: 10 mg via INTRAVENOUS
  Administered 2018-03-12 (×2): 5 mg via INTRAVENOUS

## 2018-03-12 MED ORDER — MIDAZOLAM HCL 2 MG/2ML IJ SOLN
INTRAMUSCULAR | Status: AC | PRN
  Administered 2018-03-12 (×3): 1 mg via INTRAVENOUS

## 2018-03-12 MED ORDER — SODIUM CHLORIDE 0.9 % IR SOLN
Status: DC | PRN
  Administered 2018-03-12: 15000 mL

## 2018-03-12 MED ORDER — FENTANYL CITRATE (PF) 100 MCG/2ML IJ SOLN
INTRAMUSCULAR | Status: DC | PRN
  Administered 2018-03-12 (×2): 50 ug via INTRAVENOUS

## 2018-03-12 MED ORDER — MEPERIDINE HCL 50 MG/ML IJ SOLN: 6.2500 mg | INTRAMUSCULAR | Status: DC | PRN

## 2018-03-12 MED ORDER — LACTATED RINGERS IV SOLN
INTRAVENOUS | Status: DC
  Administered 2018-03-12 (×2): via INTRAVENOUS

## 2018-03-12 MED ORDER — MIDAZOLAM HCL 2 MG/2ML IJ SOLN
INTRAMUSCULAR | Status: AC
  Filled 2018-03-12: qty 4

## 2018-03-12 MED ORDER — FENTANYL CITRATE (PF) 100 MCG/2ML IJ SOLN
INTRAMUSCULAR | Status: AC
  Filled 2018-03-12: qty 4

## 2018-03-12 MED ORDER — EPHEDRINE 5 MG/ML INJ
INTRAVENOUS | Status: AC
  Filled 2018-03-12: qty 10

## 2018-03-12 MED ORDER — SULFAMETHOXAZOLE-TRIMETHOPRIM 800-160 MG PO TABS
1.0000 | ORAL_TABLET | Freq: Two times a day (BID) | ORAL | Status: DC
  Administered 2018-03-12 – 2018-03-13 (×2): 1 via ORAL
  Filled 2018-03-12 (×2): qty 1

## 2018-03-12 MED ORDER — LIDOCAINE 2% (20 MG/ML) 5 ML SYRINGE
INTRAMUSCULAR | Status: AC
  Filled 2018-03-12: qty 5

## 2018-03-12 MED ORDER — ROCURONIUM BROMIDE 10 MG/ML (PF) SYRINGE
PREFILLED_SYRINGE | INTRAVENOUS | Status: AC
  Filled 2018-03-12: qty 10

## 2018-03-12 SURGICAL SUPPLY — 51 items
APPLICATOR SURGIFLO ENDO (HEMOSTASIS) ×3 IMPLANT
BAG URINE DRAINAGE (UROLOGICAL SUPPLIES) IMPLANT
BASKET STONE 1.7 NGAGE (UROLOGICAL SUPPLIES) ×3 IMPLANT
BASKET STONE NITINOL 3FRX115MB (UROLOGICAL SUPPLIES) ×3 IMPLANT
BASKET ZERO TIP NITINOL 2.4FR (BASKET) ×3 IMPLANT
BENZOIN TINCTURE PRP APPL 2/3 (GAUZE/BANDAGES/DRESSINGS) ×6 IMPLANT
BLADE SURG 15 STRL LF DISP TIS (BLADE) ×1 IMPLANT
BLADE SURG 15 STRL SS (BLADE) ×2
CATH FOLEY 2W COUNCIL 20FR 5CC (CATHETERS) IMPLANT
CATH ROBINSON RED A/P 20FR (CATHETERS) IMPLANT
CATH X-FORCE N30 NEPHROSTOMY (TUBING) ×3 IMPLANT
COVER SURGICAL LIGHT HANDLE (MISCELLANEOUS) ×3 IMPLANT
DRAPE C-ARM 42X120 X-RAY (DRAPES) ×3 IMPLANT
DRAPE LINGEMAN PERC (DRAPES) ×3 IMPLANT
DRAPE SURG IRRIG POUCH 19X23 (DRAPES) ×3 IMPLANT
DRSG PAD ABDOMINAL 8X10 ST (GAUZE/BANDAGES/DRESSINGS) IMPLANT
DRSG TEGADERM 8X12 (GAUZE/BANDAGES/DRESSINGS) ×6 IMPLANT
EXTRACTOR STONE NITINOL NGAGE (UROLOGICAL SUPPLIES) ×3 IMPLANT
FIBER LASER FLEXIVA 1000 (UROLOGICAL SUPPLIES) IMPLANT
FIBER LASER FLEXIVA 365 (UROLOGICAL SUPPLIES) IMPLANT
FIBER LASER FLEXIVA 550 (UROLOGICAL SUPPLIES) IMPLANT
FIBER LASER TRAC TIP (UROLOGICAL SUPPLIES) IMPLANT
GAUZE SPONGE 4X4 12PLY STRL (GAUZE/BANDAGES/DRESSINGS) IMPLANT
GLOVE BIOGEL M 8.0 STRL (GLOVE) ×3 IMPLANT
GOWN STRL REUS W/TWL XL LVL3 (GOWN DISPOSABLE) ×3 IMPLANT
GUIDEWIRE AMPLAZ .035X145 (WIRE) ×6 IMPLANT
GUIDEWIRE STR DUAL SENSOR (WIRE) ×6 IMPLANT
KIT BASIN OR (CUSTOM PROCEDURE TRAY) ×3 IMPLANT
KIT PROBE TRILOGY 3.9X350 (MISCELLANEOUS) ×3 IMPLANT
MANIFOLD NEPTUNE II (INSTRUMENTS) ×3 IMPLANT
NS IRRIG 1000ML POUR BTL (IV SOLUTION) ×3 IMPLANT
PACK CYSTO (CUSTOM PROCEDURE TRAY) ×3 IMPLANT
PROBE LITHOCLAST ULTRA 3.8X403 (UROLOGICAL SUPPLIES) IMPLANT
PROBE PNEUMATIC 1.0MMX570MM (UROLOGICAL SUPPLIES) IMPLANT
SET IRRIG Y TYPE TUR BLADDER L (SET/KITS/TRAYS/PACK) IMPLANT
SHEATH PEELAWAY SET 9 (SHEATH) ×3 IMPLANT
SHEATH X FORCE 10MMX22CM (SHEATH) ×3 IMPLANT
SPONGE LAP 4X18 RFD (DISPOSABLE) ×3 IMPLANT
STENT CONTOUR 6FRX28X.038 (STENTS) ×6 IMPLANT
STONE CATCHER W/TUBE ADAPTER (UROLOGICAL SUPPLIES) ×3 IMPLANT
SURGIFLO W/THROMBIN 8M KIT (HEMOSTASIS) ×3 IMPLANT
SUT SILK 2 0 30  PSL (SUTURE) ×2
SUT SILK 2 0 30 PSL (SUTURE) ×1 IMPLANT
SYR 10ML LL (SYRINGE) ×3 IMPLANT
SYR 20CC LL (SYRINGE) ×6 IMPLANT
TRAY FOLEY MTR SLVR 14FR STAT (SET/KITS/TRAYS/PACK) IMPLANT
TRAY FOLEY MTR SLVR 16FR STAT (SET/KITS/TRAYS/PACK) ×3 IMPLANT
TUBING CONNECTING 10 (TUBING) ×4 IMPLANT
TUBING CONNECTING 10' (TUBING) ×2
TUBING UROLOGY SET (TUBING) ×3 IMPLANT
WATER STERILE IRR 1000ML POUR (IV SOLUTION) ×3 IMPLANT

## 2018-03-12 NOTE — Anesthesia Postprocedure Evaluation (Signed)
Anesthesia Post Note  Patient: Justin Adams  Procedure(s) Performed: LEFT NEPHROLITHOTOMY PERCUTANEOUS, STENT PLACEMENT (Left ) HOLMIUM LASER APPLICATION (Left )     Patient location during evaluation: PACU Anesthesia Type: General Level of consciousness: awake and alert, oriented and patient cooperative Pain management: pain level controlled Vital Signs Assessment: post-procedure vital signs reviewed and stable Respiratory status: spontaneous breathing, nonlabored ventilation, respiratory function stable and patient connected to nasal cannula oxygen Cardiovascular status: blood pressure returned to baseline and stable Postop Assessment: no apparent nausea or vomiting Anesthetic complications: no    Last Vitals:  Vitals:   03/12/18 1515 03/12/18 1530  BP: 131/61 135/65  Pulse: 80 81  Resp: 17 20  Temp:    SpO2: 100% 100%    Last Pain:  Vitals:   03/12/18 1530  PainSc: 0-No pain                 Davinder Haff,E. Nandan Willems

## 2018-03-12 NOTE — Op Note (Signed)
Preoperative diagnosis: Left renal calculi with hydronephrosis and hematuria  Postoperative diagnosis: Same, with mid/distal ureteral stricture, high-grade  Principal procedure: Left percutaneous nephrolithotomy, stone less than 20 mm in size, placement of 6 [place] by 28 cm contour double-J stent with out tether  Surgeon: Chizuko Trine  Anesthesia: General endotracheal  Complications: None  Estimated blood loss: 200 cc  Specimen: Kidney stone/fragments  Drains: Above mentioned stent, Foley catheter  Indications: 72 year old male with history of urolithiasis, prostate cancer, recurrent gross hematuria, on blood thinners.  The patient underwent hematuria evaluation recently.  This revealed a normal cystoscopy, 2 medium/large left renal calculi with moderate renal atrophy and hydronephrosis.  It is felt that his gross hematuria is coming from his stones, the largest of which is approximately 13 mm.  This is in the renal pelvis.  A smaller stone is in the lower pole, approximately 9 mm in size.  I discussed management with the patient.  I recommended either ureteroscopy or percutaneous nephrolithotomy.  I have favored the latter, as he does have large stone burden in his kidney.  He understands the procedure and is decided to proceed with percutaneous management.  Risks and complications have been discussed with him including but not limited to infection, bleeding, loss of kidney, second procedure, blood transfusion, among others.  He understands these and desires to proceed.  Description of procedure: The patient was properly identified and marked in the holding area.  Dr. Jacqulynn Cadet had performed percutaneous nephrostomy tube placement earlier.  He did communicate with me that the patient did have a high-grade stricture in his mid ureter.  However, he was able to eventually guide the catheter by this.  The patient was then taken to the operating room where general anesthetic was administered.  He  was placed in the prone position.  All extremities and pressure points were padded appropriately.  Left flank was prepped and draped.  Proper timeout was performed.  I passed a Super Stiff guidewire through the Kumpe catheter into the bladder using fluoroscopic guidance.  The Kumpe catheter was removed.  Peel-away sheath was then placed over top of the guidewire.  A second guidewire was advanced into the bladder.  The Super Stiff guidewire would not pass through the stricture, I subsequently passed a sensor tip guidewire with minimal difficulty into the bladder.  The peel-away sheath was removed.  I then, with fluoroscopic guidance, passed the NephroMax balloon into the renal pelvis.  The balloon was inflated to 20 atm of pressure for 3 to 4 minutes.  I negotiated an extra long access sheath over top of the NephroMax balloon and into the renal pelvis, fluoroscopically placing this.  The balloon was then deflated and removed.  Rigid nephroscope was then placed into the renal pelvis which was inspected.  Small clots were picked out.  A smaller stone was found in the proximal ureter.  It was grasped with an engage basket and extracted.  This was the 9 mm stone.  Using the rigid nephroscope, the renal pelvis and all of the available calyces were inspected.  No stone was seen.  I then replaced the rigid nephroscope with the flexible, and in the lower pole calyx I found the larger of the stones.  I could not remove this through the access sheath.  I then needed to use the trilogy lithotrite through the rigid nephroscope to break up the stone and remove it through the sheath.  Once this was done, the nephroscope, both rigid and flexible, was placed  in the renal pelvis and calyces were inspected following removal of small blood clots.  No further fragments were seen.  I then negotiated a 28 cm x 6 [place] contour double-J stent over top of the guidewire, into the bladder where a good curl was seen following removal of the  guidewire.  A curl was seen in the pelvis as well directly with the scope.  Once adequate positioning was noted, I measured the distance between the lateral renal parenchyma/calyceal edge in the skin.  I then removed the sheath over top of the trocar with FloSeal being injected into the wound, from the edge of the calyx all the way to the skin.  I then closed skin edges with a 0 silk placed in horizontal mattress fashion.  Dry sterile dressing was placed.  The procedure was then terminated.  The patient was placed in the supine position, awakened, extubated, and taken to the PACU in stable condition.  He tolerated the procedure well.  Sponge needle and instrument counts were correct x2.

## 2018-03-12 NOTE — Transfer of Care (Signed)
Immediate Anesthesia Transfer of Care Note  Patient: Justin Adams  Procedure(s) Performed: Procedure(s): LEFT NEPHROLITHOTOMY PERCUTANEOUS, STENT PLACEMENT (Left) HOLMIUM LASER APPLICATION (Left)  Patient Location: PACU  Anesthesia Type:General  Level of Consciousness:  sedated, patient cooperative and responds to stimulation  Airway & Oxygen Therapy:Patient Spontanous Breathing and Patient connected to face mask oxgen  Post-op Assessment:  Report given to PACU RN and Post -op Vital signs reviewed and stable  Post vital signs:  Reviewed and stable  Last Vitals:  Vitals:   03/12/18 1448  BP: (!) 147/67  Pulse: 88  Resp: 18  Temp: 36.4 C  SpO2: 797%    Complications: No apparent anesthesia complications

## 2018-03-12 NOTE — Discharge Instructions (Signed)
Percutaneous Nephrostomy, Care After This sheet gives you information about how to care for yourself after your procedure. Your health care provider may also give you more specific instructions. If you have problems or questions, contact your health care provider. What can I expect after the procedure? After the procedure, it is common to have:  Some soreness where the nephrostomy tube was inserted (tube insertion site).  Blood-tinged drainage from the nephrostomy tube for the first 24 hours.  Follow these instructions at home: Activity  Return to your normal activities as told by your health care provider. Ask your health care provider what activities are safe for you.  Avoid activities that may cause the nephrostomy tubing to bend.  Do not take baths, swim, or use a hot tub until your health care provider approves. Ask your health care provider if you can take showers. Cover the nephrostomy tube dressing with a watertight covering when you take a shower.  Donot drive for 24 hours if you were given a medicine to help you relax (sedative). Care of the tube insertion site  Follow instructions from your health care provider about how to take care of your tube insertion site. Make sure you: ? Wash your hands with soap and water before you change your bandage (dressing). If soap and water are not available, use hand sanitizer. ? Change your dressing as told by your health care provider. Be careful not to pull on the tube while removing the dressing. ? When you change the dressing, wash the skin around the tube, rinse well, and pat the skin dry.  Check the tube insertion area every day for signs of infection. Check for: ? More redness, swelling, or pain. ? More fluid or blood. ? Warmth. ? Pus or a bad smell. Care of the nephrostomy tube and drainage bag  Always keep the tubing, the leg bag, or the bedside drainage bags below the level of the kidney so that your urine drains  freely.  When connecting your nephrostomy tube to a drainage bag, make sure that there are no kinks in the tubing and that your urine is draining freely. You may want to use an elastic bandage to wrap any exposed tubing that goes from the nephrostomy tube to any of the connecting tubes.  At night, you may want to connect your nephrostomy tube or the leg bag to a larger bedside drainage bag.  Follow instructions from your health care provider about how to empty or change the drainage bag.  Empty the drainage bag when it becomes ? full.  Replace the drainage bag and any extension tubing that is connected to your nephrostomy tube every 3 weeks or as often as told by your health care provider. Your health care provider will explain how to change the drainage bag and extension tubing. General instructions  Take over-the-counter and prescription medicines only as told by your health care provider.  Keep all follow-up visits as told by your health care provider. This is important. Contact a health care provider if:  You have problems with any of the valves or tubing.  You have persistent pain or soreness in your back.  You have more redness, swelling, or pain around your tube insertion site.  You have more fluid or blood coming from your tube insertion site.  Your tube insertion site feels warm to the touch.  You have pus or a bad smell coming from your tube insertion site.  You have increased urine output or you feel  burning when urinating. °Get help right away if: °· You have pain in your abdomen during the first week. °· You have chest pain or have trouble breathing. °· You have a new appearance of blood in your urine. °· You have a fever or chills. °· You have back pain that is not relieved by your medicine. °· You have decreased urine output. °· Your nephrostomy tube comes out. °This information is not intended to replace advice given to you by your health care provider. Make sure you  discuss any questions you have with your health care provider. °Document Released: 01/18/2004 Document Revised: 03/08/2016 Document Reviewed: 03/08/2016 °Elsevier Interactive Patient Education © 2018 [employer] ° ° ° °Moderate Conscious Sedation, Adult, Care After °These instructions provide you with information about caring for yourself after your procedure. Your health care provider may also give you more specific instructions. Your treatment has been planned according to current medical practices, but problems sometimes occur. Call your health care provider if you have any problems or questions after your procedure. °What can I expect after the procedure? °After your procedure, it is common: °· To feel sleepy for several hours. °· To feel clumsy and have poor balance for several hours. °· To have poor judgment for several hours. °· To vomit if you eat too soon. ° °Follow these instructions at home: °For at least 24 hours after the procedure: ° °· Do not: °? Participate in activities where you could fall or become injured. °? Drive. °? Use heavy machinery. °? Drink alcohol. °? Take sleeping pills or medicines that cause drowsiness. °? Make important decisions or sign legal documents. °? Take care of children on your own. °· Rest. °Eating and drinking °· Follow the diet recommended by your health care provider. °· If you vomit: °? Drink water, juice, or soup when you can drink without vomiting. °? Make sure you have little or no nausea before eating solid foods. °General instructions °· Have a responsible adult stay with you until you are awake and alert. °· Take over-the-counter and prescription medicines only as told by your health care provider. °· If you smoke, do not smoke without supervision. °· Keep all follow-up visits as told by your health care provider. This is important. °Contact a health care provider if: °· You keep feeling nauseous or you keep vomiting. °· You feel light-headed. °· You develop a  rash. °· You have a fever. °Get help right away if: °· You have trouble breathing. °This information is not intended to replace advice given to you by your health care provider. Make sure you discuss any questions you have with your health care provider. °Document Released: 03/17/2013 Document Revised: 10/30/2015 Document Reviewed: 09/16/2015 °Elsevier Interactive Patient Education © 2018 [employer] ° °

## 2018-03-12 NOTE — Procedures (Signed)
Interventional Radiology Procedure Note  Procedure: Left percutaneous nephroureteral access for PCNL.   Complications: None  Estimated Blood Loss: None  Recommendations: - To OR for PCNL   Signed,  Criselda Peaches, MD

## 2018-03-12 NOTE — Interval H&P Note (Signed)
History and Physical Interval Note:  03/12/2018 12:37 PM  Justin Adams  has presented today for surgery, with the diagnosis of RIGHT RENAL CALCULI  The various methods of treatment have been discussed with the patient and family. After consideration of risks, benefits and other options for treatment, the patient has consented to  Procedure(s): LEFT NEPHROLITHOTOMY PERCUTANEOUS (Left) HOLMIUM LASER APPLICATION (Left) as a surgical intervention .  The patient's history has been reviewed, patient examined, no change in status, stable for surgery.  I have reviewed the patient's chart and labs.  Questions were answered to the patient's satisfaction.     Lillette Boxer Chaunda Vandergriff

## 2018-03-12 NOTE — H&P (Signed)
Chief Complaint: Patient was seen in consultation today for left renal calculi.  Referring Physician(s): [place]  Supervising Physician: Jacqulynn Cadet  Patient Status: [facility] - Out[facility]  History of Present Illness: Justin Adams is a 72 y.o. male with a past medical history of hypertension, HF, hypercholesterolemia, CVA 06/2017, prostate cancer, nephrolithiasis, polycythemia, shingles, obesity, and OSA. He has a history of nephrolithiasis, with stones treated in 2010 and 2011. He was found to have two large left renal calculi (13 mm and 8 mm) on imaging 08/18/2017. Since, he has developed hydronephrosis secondary to ureteral stricture. He has plans to have his left renal calculi removed in OR today with Dr. Diona Fanti.  IR requested by Dr. Diona Fanti for possible image-guided percutaneous left ureteral stent placement prior to OR stone removal planned for this afternoon. Patient awake and alert laying in bed with no complaints at this time. Accompanied by wife at bedside. Denies fever, chills, chest pain, dyspnea, abdominal pain, N/V, dizziness, or headache.  Patient is currently taking Eliquis- reports last dose 03/09/2018.   Past Medical History:  Diagnosis Date  . Adenomatous polyp   . Bleeding internal hemorrhoids 10/2017  . Cerebral venous thrombosis   . Grade I diastolic dysfunction 16/03/9603   noted on ECHO   . Hemorrhoids   . History of kidney stones   . Hypercholesteremia   . Hypertension   . Intracranial hemorrhage (HCC)    Right temporal lobe  . LVH (left ventricular hypertrophy) 07/11/2017   Mild, noted on ECHO   . Muscular degeneration   . Nephrolithiasis   . Obesity   . OSA (obstructive sleep apnea)   . Polycythemia   . Prostate cancer ([place]) 2003  . Shingles   . Stroke ([facility]) 06/2017   No residual  . Unsteady gait   . Ureteral calculi 02/12/2008   Left    Past Surgical History:  Procedure Laterality Date  . APPENDECTOMY     childhood    . CHOLECYSTECTOMY    . COLONOSCOPY    . HEMORRHOID BANDING  2015  . IR ANGIO EXTERNAL CAROTID SEL EXT CAROTID UNI R MOD SED  07/10/2017  . IR ANGIO INTRA EXTRACRAN SEL INTERNAL CAROTID BILAT MOD SED  07/10/2017  . IR ANGIO VERTEBRAL SEL VERTEBRAL BILAT MOD SED  07/10/2017  . LEFT HEART CATHETERIZATION WITH CORONARY ANGIOGRAM N/A 03/15/2013   Procedure: LEFT HEART CATHETERIZATION WITH CORONARY ANGIOGRAM;  Surgeon: Minus Breeding, MD;  Location: [facility] CATH LAB;  Service: Cardiovascular;  Laterality: N/A;  . PROSTATECTOMY    . STONE EXTRACTION WITH BASKET      Allergies: Patient has no known allergies.  Medications: Prior to Admission medications   Medication Sig Start Date End Date Taking? Authorizing Provider  acetaminophen (TYLENOL) 500 MG tablet Take 500-1,000 mg by mouth daily as needed for moderate pain or headache.    [provider]  amLODipine (NORVASC) 10 MG tablet TAKE 1 TABLET DAILY Patient not taking: Reported on 02/27/2018 11/19/17   Kathrynn Ducking, MD  amLODipine (NORVASC) 2.5 MG tablet Take 2.5 mg by mouth daily as needed (if systolic bp is over 540).    [provider]  atorvastatin (LIPITOR) 20 MG tablet TAKE 1 TABLET DAILY 11/19/17   Kathrynn Ducking, MD  ELIQUIS 5 MG TABS tablet TAKE 1 TABLET TWICE A DAY 12/01/17   Venancio Poisson, NP  lisinopril (PRINIVIL,ZESTRIL) 20 MG tablet Take 1 tablet (20 mg total) by mouth daily. 07/14/17   Mary Sella, NP  Melatonin 3 MG TABS Take 3 mg by mouth at bedtime as needed (sleep).    [provider]  Multiple Vitamins-Minerals (ICAPS AREDS 2) CAPS Take 1 capsule by mouth 2 (two) times daily.    [provider]  polyethylene glycol (MIRALAX / GLYCOLAX) packet Take 17 g by mouth at bedtime.    [provider]  Potassium Citrate 15 MEQ (1620 MG) TBCR Take 2 tablets by mouth 2 (two) times daily.    [provider]  Wheat Dextrin (BENEFIBER) POWD Take 1 Dose by mouth daily.     [provider]     Family History  Problem Relation Age of Onset  . Hypertension Mother   . Coronary artery disease Mother 52  . Breast cancer Mother   . Coronary artery disease Father 59       Died age 72  . CAD Brother 40    Social History   Socioeconomic History  . Marital status: Married    Spouse name: Not on file  . Number of children: 2  . Years of education: Not on file  . Highest education level: Not on file  Occupational History  . Occupation: Banker    Comment: [employer]  Social Needs  . Financial resource strain: Not on file  . Food insecurity:    Worry: Not on file    Inability: Not on file  . Transportation needs:    Medical: Not on file    Non-medical: Not on file  Tobacco Use  . Smoking status: Never Smoker  . Smokeless tobacco: Never Used  Substance and Sexual Activity  . Alcohol use: No    Frequency: Never  . Drug use: No  . Sexual activity: Not on file  Lifestyle  . Physical activity:    Days per week: Not on file    Minutes per session: Not on file  . Stress: Not on file  Relationships  . Social connections:    Talks on phone: Not on file    Gets together: Not on file    Attends religious service: Not on file    Active member of club or organization: Not on file    Attends meetings of clubs or organizations: Not on file    Relationship status: Not on file  Other Topics Concern  . Not on file  Social History Narrative  . Not on file     Review of Systems: A 12 point ROS discussed and pertinent positives are indicated in the HPI above.  All other systems are negative.  Review of Systems  Constitutional: Negative for chills and fever.  Respiratory: Negative for shortness of breath and wheezing.   Cardiovascular: Negative for chest pain and palpitations.  Gastrointestinal: Negative for abdominal pain, nausea and vomiting.  Neurological: Negative for dizziness and headaches.  Psychiatric/Behavioral: Negative for behavioral problems  and confusion.    Vital Signs: BP (!) 146/75   Pulse 79   Temp 98.2 F (36.8 C) (Oral)   Resp 18   SpO2 97%   Physical Exam  Constitutional: He is oriented to person, place, and time. He appears well-developed and well-nourished. No distress.  Cardiovascular: Normal rate, regular rhythm and normal heart sounds.  No murmur heard. Pulmonary/Chest: Effort normal and breath sounds normal. No respiratory distress. He has no wheezes.  Neurological: He is alert and oriented to person, place, and time.  Skin: Skin is warm and dry.  Psychiatric: He has a normal mood and affect. His behavior is  normal. Judgment and thought content normal.  Nursing note and vitals reviewed.    MD Evaluation Airway: WNL Heart: WNL Abdomen: WNL Chest/ Lungs: WNL ASA  Classification: 3 Mallampati/Airway Score: One   Imaging: No results found.  Labs:  CBC: Recent Labs    07/13/17 0342 07/14/17 0357 03/05/18 0934 03/12/18 0839  WBC 7.2 7.4 4.9 5.1  HGB 16.2 15.7 15.6 16.0  HCT 48.3 47.1 47.7 48.6  PLT 205 210 167 163    COAGS: Recent Labs    07/09/17 2049 03/12/18 0839  INR 1.07 1.01  APTT 27  --     BMP: Recent Labs    07/13/17 0342 07/14/17 0357 03/05/18 0934 03/12/18 0839  NA 141 141 141 142  K 3.7 3.5 4.2 3.9  CL 109 106 108 109  CO2 22 25 27 26   GLUCOSE 99 89 95 82  BUN 17 16 20 16   CALCIUM 8.3* 8.4* 8.6* 8.9  CREATININE 0.90 0.87 0.75 0.82  GFRNONAA >60 >60 >60 >60  GFRAA >60 >60 >60 >60    LIVER FUNCTION TESTS: No results for input(s): BILITOT, AST, ALT, ALKPHOS, PROT, ALBUMIN in the last 8760 hours.  TUMOR MARKERS: No results for input(s): AFPTM, CEA, CA199, CHROMGRNA in the last 8760 hours.  Assessment and Plan:  Left renal calculi. Plan for image-guided percutaneous left ureter stent placement today with Dr. Laurence Ferrari. Patient is NPO. Denies fever and WBCs WNL. He does take Eliquis- reports last dose 03/09/2018. INR 1.01 seconds this AM.  Risks  and benefits discussed with the patient including bleeding, infection, damage to adjacent structures, and sepsis. All of the patient's questions were answered, patient is agreeable to proceed. Consent signed and in chart.   Thank you for this interesting consult.  I greatly enjoyed meeting Huber A Mckinnie and look forward to participating in their care.  A copy of this report was sent to the requesting provider on this date.  Electronically Signed: Earley Abide, PA-C 03/12/2018, 9:18 AM   I spent a total of 25 Minutes in face to face in clinical consultation, greater than 50% of which was counseling/coordinating care for left renal calculi.

## 2018-03-12 NOTE — Anesthesia Preprocedure Evaluation (Addendum)
Anesthesia Evaluation  Patient identified by MRN, date of birth, ID band Patient awake    Reviewed: Allergy & Precautions, NPO status , Patient's Chart, lab work & pertinent test results  History of Anesthesia Complications Negative for: history of anesthetic complications  Airway Mallampati: I  TM Distance: >3 FB Neck ROM: Full    Dental  (+) Caps, Dental Advisory Given   Pulmonary sleep apnea and Continuous Positive Airway Pressure Ventilation ,    breath sounds clear to auscultation       Cardiovascular hypertension, Pt. on medications (-) angina Rhythm:Regular Rate:Normal  2/19 ECHO: EF 55-60%, valves OK   Neuro/Psych CVA, No Residual Symptoms    GI/Hepatic negative GI ROS, Neg liver ROS,   Endo/Other  negative endocrine ROS  Renal/GU stones   Prostate cancer    Musculoskeletal   Abdominal (+) + obese,   Peds  Hematology Eliquis: last dose monday   Anesthesia Other Findings   Reproductive/Obstetrics                            Anesthesia Physical Anesthesia Plan  ASA: III  Anesthesia Plan: General   Post-op Pain Management:    Induction: Intravenous  PONV Risk Score and Plan: 2 and Ondansetron and Dexamethasone  Airway Management Planned: LMA  Additional Equipment:   Intra-op Plan:   Post-operative Plan:   Informed Consent: I have reviewed the patients History and Physical, chart, labs and discussed the procedure including the risks, benefits and alternatives for the proposed anesthesia with the patient or authorized representative who has indicated his/her understanding and acceptance.   Dental advisory given  Plan Discussed with: CRNA and Surgeon  Anesthesia Plan Comments: (Plan routine monitors, GA- LMA OK *addendum: pt to be in prone position, plan changed to GETA)       Anesthesia Quick Evaluation

## 2018-03-12 NOTE — Anesthesia Procedure Notes (Signed)
Procedure Name: Intubation Date/Time: 03/12/2018 12:48 PM Performed by: Lind Covert, CRNA Pre-anesthesia Checklist: Patient identified, Emergency Drugs available, Suction available, Patient being monitored and Timeout performed Patient Re-evaluated:Patient Re-evaluated prior to induction Oxygen Delivery Method: Circle system utilized Preoxygenation: Pre-oxygenation with 100% oxygen Induction Type: IV induction Ventilation: Mask ventilation without difficulty Laryngoscope Size: Mac and 4 Grade View: Grade I Tube type: Oral Tube size: 7.5 mm Number of attempts: 1 Airway Equipment and Method: Stylet Placement Confirmation: ETT inserted through vocal cords under direct vision,  positive ETCO2 and breath sounds checked- equal and bilateral Secured at: 22 cm Tube secured with: Tape Dental Injury: Teeth and Oropharynx as per pre-operative assessment

## 2018-03-13 ENCOUNTER — Encounter (HOSPITAL_COMMUNITY): Payer: Self-pay | Admitting: Urology

## 2018-03-13 DIAGNOSIS — Z8546 Personal history of malignant neoplasm of prostate: Secondary | ICD-10-CM | POA: Diagnosis not present

## 2018-03-13 DIAGNOSIS — I10 Essential (primary) hypertension: Secondary | ICD-10-CM | POA: Diagnosis not present

## 2018-03-13 DIAGNOSIS — Z87442 Personal history of urinary calculi: Secondary | ICD-10-CM | POA: Diagnosis not present

## 2018-03-13 DIAGNOSIS — N131 Hydronephrosis with ureteral stricture, not elsewhere classified: Secondary | ICD-10-CM | POA: Diagnosis not present

## 2018-03-13 DIAGNOSIS — N132 Hydronephrosis with renal and ureteral calculous obstruction: Secondary | ICD-10-CM | POA: Diagnosis not present

## 2018-03-13 DIAGNOSIS — R310 Gross hematuria: Secondary | ICD-10-CM | POA: Diagnosis not present

## 2018-03-13 LAB — HEMOGLOBIN AND HEMATOCRIT, BLOOD
HCT: 44.3 % (ref 39.0–52.0)
HEMOGLOBIN: 14.5 g/dL (ref 13.0–17.0)

## 2018-03-13 NOTE — Discharge Summary (Signed)
Patient ID: Justin Adams MRN: [id number] DOB/AGE: [birth year] 72 y.o.  Admit date: 03/12/2018 Discharge date: 03/13/2018  Primary Care Physician:  [name], MD  Discharge Diagnoses:  Left renal calculi   Consults: None   Discharge Medications: Allergies as of 03/13/2018   No Known Allergies     Medication List    STOP taking these medications   ELIQUIS 5 MG Tabs tablet Generic drug:  apixaban   Potassium Citrate 15 MEQ (1620 MG) Tbcr     TAKE these medications   acetaminophen 500 MG tablet Commonly known as:  TYLENOL Take 500-1,000 mg by mouth daily as needed for moderate pain or headache.   amLODipine 2.5 MG tablet Commonly known as:  NORVASC Take 2.5 mg by mouth daily as needed (if systolic bp is over 546).   amLODipine 10 MG tablet Commonly known as:  NORVASC TAKE 1 TABLET DAILY   atorvastatin 20 MG tablet Commonly known as:  LIPITOR TAKE 1 TABLET DAILY   BENEFIBER Powd Take 1 Dose by mouth daily.   ICAPS AREDS 2 Caps Take 1 capsule by mouth 2 (two) times daily.   lisinopril 20 MG tablet Commonly known as:  PRINIVIL,ZESTRIL Take 1 tablet (20 mg total) by mouth daily.   Melatonin 3 MG Tabs Take 3 mg by mouth at bedtime as needed (sleep).   polyethylene glycol packet Commonly known as:  MIRALAX / GLYCOLAX Take 17 g by mouth at bedtime.        Significant Diagnostic Studies:  Dg C-arm 1-60 Min-no Report  Result Date: 03/12/2018 Fluoroscopy was utilized by the requesting physician.  No radiographic interpretation.   Ir Ureteral Stent Left New Access W/o Sep Nephrostomy Cath  Result Date: 03/12/2018 INDICATION: Renal stones, access for left percutaneous nephrolithotomy. EXAM: 1. Percutaneous puncture of the renal collecting system under fluoroscopic guidance 2. Placement of a percutaneous nephrostomy tube Operating Physician:  Criselda Peaches, MD COMPARISON:  CT of the abdomen and pelvis-07/11/2017 MEDICATIONS: 2 g Ancef; The  antibiotic was administered in an appropriate time frame prior to skin puncture. ANESTHESIA/SEDATION: Fentanyl 100 mcg IV; Versed 3 mg IV Moderate Sedation Time:  21 minutes The patient was continuously monitored during the procedure by the interventional radiology nurse under my direct supervision. CONTRAST:  39mL ISOVUE-300 IOPAMIDOL (ISOVUE-300) INJECTION 61%, 60mL ISOVUE-300 IOPAMIDOL (ISOVUE-300) INJECTION 61% - administered into the collecting system(s) FLUOROSCOPY TIME:  Fluoroscopy Time:  minutes  seconds ( mGy). COMPLICATIONS: None immediate. PROCEDURE: Informed written consent was obtained from the patient after a thorough discussion of the procedural risks, benefits and alternatives. All questions were addressed. Maximal Sterile Barrier Technique was utilized including caps, mask, sterile gowns, sterile gloves, sterile drape, hand hygiene and skin antiseptic. A timeout was performed prior to the initiation of the procedure. A pre procedural spot fluoroscopic image was obtained of the upper abdomen. Ultrasound scanning performed of the kidney which demonstrated renal cortical thinning and diffuse hydronephrosis. A suitable posterior calyx was identified. Local anesthesia was attained by infiltration with 1% lidocaine. Under real-time ultrasound guidance, a 21 gauge Accustick needle was carefully advanced into the dilated posterior calyx. There was free return of clear urine. Access to the calyx was confirmed with advancement of a Nitrex wire into the collecting system. An Accustick set was utilized to dilate the tract and was subsequently exchanged for a Kumpe catheter over a Bentson wire. Additional imaging demonstrates marked hydroureteronephrosis with a focal high-grade stenosis versus occlusion of the mid ureter at the L4 level. After  significant manipulation, a hydrophilic road runner 782 wire was successfully navigated beyond the stricture, down the remainder of the ureter and into the bladder. The  Kumpe catheter was advanced down the ureter and into the urinary bladder. Postprocedural spot radiographs were obtained in various obliquities and the catheter was sutured to the skin. The catheter was capped and a dressing was placed. The patient tolerated the procedure well without immediate postprocedural complication. IMPRESSION: 1. High-grade stenosis versus focal occlusion of the right mid ureter at the level of L4. 2. Successful crossing of ureteral stricture/occlusion and placement of a right percutaneous nephroureteral tube which passes from a posterior lower pole calyx, through the ureter and into the bladder. These results were called by telephone at the time of interpretation on 03/12/2018 at approximately 11 a.m. to Dr. Franchot Gallo , who verbally acknowledged these results. Signed, Criselda Peaches, MD, [facility] Electronically Signed   By: Jacqulynn Cadet M.D.   On: 03/12/2018 12:50    Brief H and P: For complete details please refer to admission H and P, but in brief pt is admitted for mgmt of largert renal stones with hematuria.  Hospital Course: Uncomplicated postop stay. Active Problems:   Calculus of kidney   Day of Discharge BP (!) 145/72   Pulse 89   Temp (!) 97.5 F (36.4 C) (Oral)   Resp 16   Ht 6\' 2"  (1.88 m)   Wt 110.3 kg   SpO2 100%   BMI 31.22 kg/m   Results for orders placed or performed during the hospital encounter of 03/12/18 (from the past 24 hour(s))  Basic metabolic panel     Status: None   Collection Time: 03/12/18  8:39 AM  Result Value Ref Range   Sodium 142 135 - 145 mmol/L   Potassium 3.9 3.5 - 5.1 mmol/L   Chloride 109 98 - 111 mmol/L   CO2 26 22 - 32 mmol/L   Glucose, Bld 82 70 - 99 mg/dL   BUN 16 8 - 23 mg/dL   Creatinine, Ser 0.82 0.61 - 1.24 mg/dL   Calcium 8.9 8.9 - 10.3 mg/dL   GFR calc non Af Amer >60 >60 mL/min   GFR calc Af Amer >60 >60 mL/min   Anion gap 7  5 - 15  CBC with Differential/Platelet     Status: None   Collection Time: 03/12/18  8:39 AM  Result Value Ref Range   WBC 5.1 4.0 - 10.5 K/uL   RBC 5.53 4.22 - 5.81 MIL/uL   Hemoglobin 16.0 13.0 - 17.0 g/dL   HCT 48.6 39.0 - 52.0 %   MCV 87.9 78.0 - 100.0 fL   MCH 28.9 26.0 - 34.0 pg   MCHC 32.9 30.0 - 36.0 g/dL   RDW 13.9 11.5 - 15.5 %   Platelets 163 150 - 400 K/uL   Neutrophils Relative % 68 %   Neutro Abs 3.5 1.7 - 7.7 K/uL   Lymphocytes Relative 23 %   Lymphs Abs 1.2 0.7 - 4.0 K/uL   Monocytes Relative 7 %   Monocytes Absolute 0.3 0.1 - 1.0 K/uL   Eosinophils Relative 2 %   Eosinophils Absolute 0.1 0.0 - 0.7 K/uL   Basophils Relative 0 %   Basophils Absolute 0.0 0.0 - 0.1 K/uL  Protime-INR     Status: None   Collection Time: 03/12/18  8:39 AM  Result Value Ref Range   Prothrombin Time 13.2 11.4 - 15.2 seconds  INR 1.01   Hemoglobin and hematocrit, blood     Status: None   Collection Time: 03/13/18  5:36 AM  Result Value Ref Range   Hemoglobin 14.5 13.0 - 17.0 g/dL   HCT 44.3 39.0 - 52.0 %    Physical Exam: General: Alert and awake oriented x3 not in any acute distress. HEENT: anicteric sclera, pupils reactive to light and accommodation CVS: S1-S2 clear no murmur rubs or gallops Chest: clear to auscultation bilaterally, no wheezing rales or rhonchi Abdomen: soft nontender, nondistended, normal bowel sounds, no organomegaly Extremities: no cyanosis, clubbing or edema noted bilaterally Neuro: Cranial nerves II-XII intact, no focal neurological deficits  Disposition:  Home  Diet:  Reglar  Activity:  Discussed w/ pt/ wife   Disposition and Follow-up:    Followup arranged   DISCHARGE FOLLOW-UP Follow-up Information    Jed Limerick, NP.   Specialty:  Urology Why:  10/17 @ 0800 Contact information: [place] [address] [phone]           Time spent on Discharge:  15 mins  Signed: Lillette Boxer  Vasilia Dise 03/13/2018, 7:34 AM

## 2018-03-13 NOTE — Discharge Instructions (Signed)
DISCHARGE INSTRUCTIONS FOR PERCUTANEOUS STONE SURGERY MEDICATIONS:  1. DO NOT RESUME YOUR IBUPROFEN, or any other medicines like aspirin, motrin, excedrin, advil, aleve, vitamin E, fish oil as these can all cause bleeding x 10 days.  2. Resume all your other meds from home - except do not take any other pain meds that you may have at home.  It is OK to resume Eliquis once urine has turned yellow w/ earliest start on Monday 10/7  ACTIVITY 1. No strenuous activity, sexual activity, or lifting greater than 10 pounds for 2 weeks. 2. No driving while on narcotic pain medications 3. Drink plenty of water 4. Continue to walk at home - you can still get blood clots when you are at home, so keep active, but don't over do it. 5. May return to work in 1 week (but not heavy or strenuous activity).   BATHING You can shower.  Cover your wound with a dressing and remove the dressing immediately after the shower.  Do not submerge wound under water.   WOUND CARE Your wound will drain bloody fluid and may do so for 7-14 days. You have 2 options for dressings:  1. You may use gauze and tape to dress your wound.  If you choose this method, then change the dressing as it becomes soaked.  Change it at least once daily until it stops draining. You may switch to a Band Aid once drainage stops. 2. If drainage is copious, you may use an ostomy device.  This is a bag with an andhesive circle.  The circle has a hole in the middle of it and you cut the hole to the size needed to fit the wound.  This will collect the drainage in the bag and allow you to drain the bag as needed.   SIGNS/SYMPTOMS TO CALL: 1. Please call us if you have a fever greater than 101.5, uncontrolled nausea/vomiting, uncontrolled pain, dizziness, unable to urinate, bloody urine, chest pain, shortness of breath, leg swelling, leg pain, redness around wound, drainage from wound, or any other concerns or questions. 2. You can reach [place] at  [phone]. FOLLOW-UP

## 2018-03-19 ENCOUNTER — Other Ambulatory Visit: Payer: Self-pay | Admitting: Adult Health

## 2018-03-19 NOTE — Telephone Encounter (Signed)
Please advise patient to follow-up with urology as Eliquis was stopped after left nephrolithotomy with stent placement on 03/12/2018.  It is recommended from neurology standpoint to restart Eliquis when safe to do so due to multiple thromboses in his right transverse thrombus, sigmoid sinus and jugular vein and it was recommended to continue Eliquis lifelong for secondary stroke prevention.  Urology will determine at what point he will be able to restart Eliquis, if able.

## 2018-03-19 NOTE — Telephone Encounter (Signed)
Pt requesting refills for Eliquis sent through [facility]

## 2018-03-20 NOTE — Telephone Encounter (Signed)
Rn call [facility] and pt does not have anymore refills. Pt will need a rx if the medication is to be continue.

## 2018-03-23 NOTE — Telephone Encounter (Signed)
Rn call patient about needing a refill for eliquis. Rn stated Janett Billow NP wants him to contact his urologist about if its safe to restart the eliquis. Pt stated he was told by the surgeon to do not restart the medication until his urine is clear. Pt stated his urine is still bloody at times and clear. He is seeing the PA at [facility] on this Thursday for a follow up. Pt verbalized knowing he cannot start medication until urology approves him . Pt stated he has a week left of eliquis left,but needed a refill when its safe to restart it. It needs to be sent to [facility]. Rn stated message will be sent to [facility] NP.

## 2018-03-23 NOTE — Telephone Encounter (Signed)
Will attempt to reach out to urology for restarting Eliquis as unable to locate this information in their discharge note.  Per patient, urology stated he could restart Eliquis once his urine is clear but would like to ensure this is correct prior to restarting.

## 2018-03-26 DIAGNOSIS — N200 Calculus of kidney: Secondary | ICD-10-CM | POA: Diagnosis not present

## 2018-03-27 ENCOUNTER — Other Ambulatory Visit: Payer: Self-pay | Admitting: Urology

## 2018-03-27 DIAGNOSIS — N130 Hydronephrosis with ureteropelvic junction obstruction: Secondary | ICD-10-CM

## 2018-03-27 MED ORDER — APIXABAN 5 MG PO TABS: 5.0000 mg | ORAL_TABLET | Freq: Two times a day (BID) | ORAL | 3 refills | Status: DC

## 2018-03-27 NOTE — Telephone Encounter (Signed)
Yes please refill at this time

## 2018-03-27 NOTE — Telephone Encounter (Signed)
Ok to refill his eliquis now?

## 2018-03-27 NOTE — Telephone Encounter (Signed)
Eliquis 5mg  BID, 90 day supply sent to [facility] delivery.

## 2018-03-27 NOTE — Telephone Encounter (Signed)
Pt has called back to state he has clearance from his Urologist to go back on the  Eliquis .  Pt is asking that someone calls [employer] about his refill for this medication.

## 2018-03-27 NOTE — Addendum Note (Signed)
Addended by: Belinda Block A on: 03/27/2018 12:44 PM   Modules accepted: Orders

## 2018-03-31 DIAGNOSIS — N200 Calculus of kidney: Secondary | ICD-10-CM | POA: Diagnosis not present

## 2018-03-31 DIAGNOSIS — Z23 Encounter for immunization: Secondary | ICD-10-CM | POA: Diagnosis not present

## 2018-04-02 ENCOUNTER — Ambulatory Visit (HOSPITAL_COMMUNITY)
Admission: RE | Admit: 2018-04-02 | Discharge: 2018-04-02 | Disposition: A | Discharge status: Home / Self Care | Payer: Medicare Other | Source: Ambulatory Visit | Attending: Urology | Admitting: Urology

## 2018-04-02 DIAGNOSIS — N130 Hydronephrosis with ureteropelvic junction obstruction: Secondary | ICD-10-CM | POA: Diagnosis not present

## 2018-04-02 DIAGNOSIS — N132 Hydronephrosis with renal and ureteral calculous obstruction: Secondary | ICD-10-CM | POA: Diagnosis not present

## 2018-04-02 MED ORDER — FUROSEMIDE 10 MG/ML IJ SOLN
INTRAMUSCULAR | Status: AC
  Filled 2018-04-02: qty 8

## 2018-04-02 MED ORDER — FUROSEMIDE 10 MG/ML IJ SOLN
55.0000 mg | Freq: Once | INTRAMUSCULAR | Status: AC
  Administered 2018-04-02: 55 mg via INTRAVENOUS

## 2018-04-02 MED ORDER — TECHNETIUM TC 99M MERTIATIDE
5.5000 | Freq: Once | INTRAVENOUS | Status: AC
  Administered 2018-04-02: 5.5 via INTRAVENOUS

## 2018-04-09 DIAGNOSIS — N200 Calculus of kidney: Secondary | ICD-10-CM | POA: Diagnosis not present

## 2018-04-14 NOTE — Progress Notes (Signed)
NEUROLOGY CONSULTATION NOTE  [name] MRN: [id number] DOB: May 30, 1946  Referring provider: [name], MD Primary care provider: [name], MD  Reason for consult:  Hemorrhagic stroke, cerebral venous sinus thrombosis  HISTORY OF PRESENT ILLNESS: Justin Adams is a 72 year old right-handed male with hypertension, hypercholesterolemia, and OSA and history of prostate cancer who presents for history of cerebral venous thrombosis.  CT, MRI, MRA, and MRV of the head from his hospitalization were personally reviewed.  He was admitted to [facility] on 07/09/17 for right frontal headache and nausea.  MRI and MRV of head showed right temporal intracerebral hemorrhage with thromboses of the right transverse sinus, sigmoid sinus, and right jugular vein, which was confirmed on diagnostic angiogram.  MRA of the head showed no intracranial arterial abnormality such as aneurysm.  CT of the chest abdomen and pelvis revealed no malignancy.  2D echocardiogram revealed ejection fraction of 55%.  DRVVT borderline elevated at 49.7 but otherwise hypercoagulable panel was negative.  LDL was 103 and hemoglobin A1c was 5.3.  He has history of polycythemia with Hgb level 17.3-17.5 going back several years.  Hgb on day of admission was 17.5.  Subsequent Hgb levels have been around 16.  He was started on IV heparin and was subsequently transitioned to Eliquis.  He was also discharged on Lipitor 20 mg daily.  He has been feeling well.    Medications include:  Eliquis, amlodipine 2.5mg , lisinopril 20mg , atorvastatin 20mg   PAST MEDICAL HISTORY: Past Medical History:  Diagnosis Date  . Adenomatous polyp   . Bleeding internal hemorrhoids 10/2017  . Cerebral venous thrombosis   . Grade I diastolic dysfunction 67/89/3810   noted on ECHO   . Hemorrhoids   . History of kidney stones   . Hypercholesteremia   . Hypertension   . Intracranial hemorrhage (HCC)    Right temporal lobe  . LVH  (left ventricular hypertrophy) 07/11/2017   Mild, noted on ECHO   . Muscular degeneration   . Nephrolithiasis   . Obesity   . OSA (obstructive sleep apnea)   . Polycythemia   . Prostate cancer ([place]) 2003  . Shingles   . Stroke ([place]) 06/2017   No residual  . Unsteady gait   . Ureteral calculi 02/12/2008   Left    PAST SURGICAL HISTORY: Past Surgical History:  Procedure Laterality Date  . APPENDECTOMY     childhood  . CHOLECYSTECTOMY    . COLONOSCOPY    . HEMORRHOID BANDING  2015  . HOLMIUM LASER APPLICATION Left 17/10/1023   Procedure: HOLMIUM LASER APPLICATION;  Surgeon: Franchot Gallo, MD;  Location: [facility] ORS;  Service: Urology;  Laterality: Left;  . IR ANGIO EXTERNAL CAROTID SEL EXT CAROTID UNI R MOD SED  07/10/2017  . IR ANGIO INTRA EXTRACRAN SEL INTERNAL CAROTID BILAT MOD SED  07/10/2017  . IR ANGIO VERTEBRAL SEL VERTEBRAL BILAT MOD SED  07/10/2017  . IR URETERAL STENT LEFT NEW ACCESS W/O SEP NEPHROSTOMY CATH  03/12/2018  . LEFT HEART CATHETERIZATION WITH CORONARY ANGIOGRAM N/A 03/15/2013   Procedure: LEFT HEART CATHETERIZATION WITH CORONARY ANGIOGRAM;  Surgeon: Minus Breeding, MD;  Location: [facility] CATH LAB;  Service: Cardiovascular;  Laterality: N/A;  . NEPHROLITHOTOMY Left 03/12/2018   Procedure: LEFT NEPHROLITHOTOMY PERCUTANEOUS, STENT PLACEMENT;  Surgeon: Franchot Gallo, MD;  Location: [facility] ORS;  Service: Urology;  Laterality: Left;  . PROSTATECTOMY    . STONE EXTRACTION WITH BASKET      MEDICATIONS: Current Outpatient Medications on File Prior  to Visit  Medication Sig Dispense Refill  . acetaminophen (TYLENOL) 500 MG tablet Take 500-1,000 mg by mouth daily as needed for moderate pain or headache.    Marland Kitchen amLODipine (NORVASC) 10 MG tablet TAKE 1 TABLET DAILY (Patient not taking: Reported on 02/27/2018) 90 tablet 1  . amLODipine (NORVASC) 2.5 MG tablet Take 2.5 mg by mouth daily as needed (if systolic bp is over 638).    Marland Kitchen apixaban (ELIQUIS) 5 MG TABS tablet Take 1 tablet (5  mg total) by mouth 2 (two) times daily. 180 tablet 3  . atorvastatin (LIPITOR) 20 MG tablet TAKE 1 TABLET DAILY 90 tablet 1  . lisinopril (PRINIVIL,ZESTRIL) 20 MG tablet Take 1 tablet (20 mg total) by mouth daily. 30 tablet 1  . Melatonin 3 MG TABS Take 3 mg by mouth at bedtime as needed (sleep).    . Multiple Vitamins-Minerals (ICAPS AREDS 2) CAPS Take 1 capsule by mouth 2 (two) times daily.    . polyethylene glycol (MIRALAX / GLYCOLAX) packet Take 17 g by mouth at bedtime.    . Wheat Dextrin (BENEFIBER) POWD Take 1 Dose by mouth daily.     No current facility-administered medications on file prior to visit.     ALLERGIES: No Known Allergies  FAMILY HISTORY: Family History  Problem Relation Age of Onset  . Hypertension Mother   . Coronary artery disease Mother 51  . Breast cancer Mother   . Coronary artery disease Father 37       Died age 10  . CAD Brother 44    SOCIAL HISTORY: Social History   Socioeconomic History  . Marital status: Married    Spouse name: Not on file  . Number of children: 2  . Years of education: Not on file  . Highest education level: Not on file  Occupational History  . Occupation: Banker    Comment: [employer]  Social Needs  . Financial resource strain: Not on file  . Food insecurity:    Worry: Not on file    Inability: Not on file  . Transportation needs:    Medical: Not on file    Non-medical: Not on file  Tobacco Use  . Smoking status: Never Smoker  . Smokeless tobacco: Never Used  Substance and Sexual Activity  . Alcohol use: No    Frequency: Never  . Drug use: No  . Sexual activity: Not on file  Lifestyle  . Physical activity:    Days per week: Not on file    Minutes per session: Not on file  . Stress: Not on file  Relationships  . Social connections:    Talks on phone: Not on file    Gets together: Not on file    Attends religious service: Not on file    Active member of club or organization: Not on file    Attends meetings of  clubs or organizations: Not on file    Relationship status: Not on file  . Intimate partner violence:    Fear of current or ex partner: Not on file    Emotionally abused: Not on file    Physically abused: Not on file    Forced sexual activity: Not on file  Other Topics Concern  . Not on file  Social History Narrative  . Not on file    REVIEW OF SYSTEMS: Constitutional: No fevers, chills, or sweats, no generalized fatigue, change in appetite Eyes: No visual changes, double vision, eye pain Ear, nose and throat: No hearing  loss, ear pain, nasal congestion, sore throat Cardiovascular: No chest pain, palpitations Respiratory:  No shortness of breath at rest or with exertion, wheezes GastrointestinaI: No nausea, vomiting, diarrhea, abdominal pain, fecal incontinence Genitourinary:  No dysuria, urinary retention or frequency Musculoskeletal:  No neck pain, back pain Integumentary: No rash, pruritus, skin lesions Neurological: as above Psychiatric: No depression, insomnia, anxiety Endocrine: No palpitations, fatigue, diaphoresis, mood swings, change in appetite, change in weight, increased thirst Hematologic/Lymphatic:  No purpura, petechiae. Allergic/Immunologic: no itchy/runny eyes, nasal congestion, recent allergic reactions, rashes  PHYSICAL EXAM: Blood pressure 122/66, pulse 83, height 6' 2.5" (1.892 m), weight 247 lb (112 kg), SpO2 94 %. General: No acute distress.  Patient appears well-groomed.  Head:  Normocephalic/atraumatic Eyes:  fundi examined but not visualized Neck: supple, no paraspinal tenderness, full range of motion Back: No paraspinal tenderness Heart: regular rate and rhythm Lungs: Clear to auscultation bilaterally. Vascular: No carotid bruits. Neurological Exam: Mental status: alert and oriented to person, place, and time, recent and remote memory intact, fund of knowledge intact, attention and concentration intact, speech fluent and not dysarthric, language  intact. Cranial nerves: CN I: not tested CN II: pupils equal, round and reactive to light, visual fields intact CN III, IV, VI:  full range of motion, no nystagmus, no ptosis CN V: facial sensation intact CN VII: upper and lower face symmetric CN VIII: hearing intact CN IX, X: gag intact, uvula midline CN XI: sternocleidomastoid and trapezius muscles intact CN XII: tongue midline Bulk & Tone: normal, no fasciculations. Motor:  5/5 throughout  Sensation:  Pinprick and vibration sensation intact. Deep Tendon Reflexes:  2+ throughout, toes downgoing.  Finger to nose testing:  Without dysmetria.  Heel to shin:  Without dysmetria.  Gait:  Normal station and stride.  Able to turn.  Romberg negative.  IMPRESSION: 1.  Cerebral venous thrombosis with right temporal intracerebral hemorrhage, unclear etiology. 2.  Polycythemia 3.  Hypertension 4.  Hyperlipidemia 5.  OSA  PLAN: 1.  Remain on long-term anticoagulation 2.  Continue statin therapy 3.  Continue blood pressure control 4.  Continue exercise 5.  Recommend Mediterranean diet 6.  Continue CPAP for OSA 7.  Follow up in 6 months  Thank you for allowing me to take part in the care of this patient.  [name], DO  CC: [name], MD

## 2018-04-15 ENCOUNTER — Ambulatory Visit (INDEPENDENT_AMBULATORY_CARE_PROVIDER_SITE_OTHER): Payer: Medicare Other | Admitting: Neurology

## 2018-04-15 ENCOUNTER — Encounter: Payer: Self-pay | Admitting: Neurology

## 2018-04-15 VITALS — BP 122/66 | HR 83 | Ht 74.5 in | Wt 247.0 lb

## 2018-04-15 DIAGNOSIS — I10 Essential (primary) hypertension: Secondary | ICD-10-CM | POA: Diagnosis not present

## 2018-04-15 DIAGNOSIS — I611 Nontraumatic intracerebral hemorrhage in hemisphere, cortical: Secondary | ICD-10-CM

## 2018-04-15 DIAGNOSIS — I639 Cerebral infarction, unspecified: Secondary | ICD-10-CM | POA: Diagnosis not present

## 2018-04-15 DIAGNOSIS — G08 Intracranial and intraspinal phlebitis and thrombophlebitis: Secondary | ICD-10-CM

## 2018-04-15 DIAGNOSIS — E785 Hyperlipidemia, unspecified: Secondary | ICD-10-CM

## 2018-04-15 NOTE — Patient Instructions (Addendum)
1.  Continue Eliquis for secondary stroke prevention 2.  Continue atorvastatin to keep cholesterol low 3.  Continue blood pressure medications 4.  Continue exercise.  Recommend seeing a podiatrist regarding the foot. 5.  Recommend Mediterranean diet (see below) 6.  Follow up in 6 months.   Mediterranean Diet A Mediterranean diet refers to food and lifestyle choices that are based on the traditions of countries located on the [employer]. This way of eating has been shown to help prevent certain conditions and improve outcomes for people who have chronic diseases, like kidney disease and heart disease. What are tips for following this plan? Lifestyle  Cook and eat meals together with your family, when possible.  Drink enough fluid to keep your urine clear or pale yellow.  Be physically active every day. This includes: ? Aerobic exercise like running or swimming. ? Leisure activities like gardening, walking, or housework.  Get 7-8 hours of sleep each night.  If recommended by your health care provider, drink red wine in moderation. This means 1 glass a day for nonpregnant women and 2 glasses a day for men. A glass of wine equals 5 oz (150 mL). Reading food labels  Check the serving size of packaged foods. For foods such as rice and pasta, the serving size refers to the amount of cooked product, not dry.  Check the total fat in packaged foods. Avoid foods that have saturated fat or trans fats.  Check the ingredients list for added sugars, such as corn syrup. Shopping  At the grocery store, buy most of your food from the areas near the walls of the store. This includes: ? Fresh fruits and vegetables (produce). ? Grains, beans, nuts, and seeds. Some of these may be available in unpackaged forms or large amounts (in bulk). ? Fresh seafood. ? Poultry and eggs. ? Low-fat dairy products.  Buy whole ingredients instead of prepackaged foods.  Buy fresh fruits and vegetables  in-season from local farmers markets.  Buy frozen fruits and vegetables in resealable bags.  If you do not have access to quality fresh seafood, buy precooked frozen shrimp or canned fish, such as tuna, salmon, or sardines.  Buy small amounts of raw or cooked vegetables, salads, or olives from the deli or salad bar at your store.  Stock your pantry so you always have certain foods on hand, such as olive oil, canned tuna, canned tomatoes, rice, pasta, and beans. Cooking  Cook foods with extra-virgin olive oil instead of using butter or other vegetable oils.  Have meat as a side dish, and have vegetables or grains as your main dish. This means having meat in small portions or adding small amounts of meat to foods like pasta or stew.  Use beans or vegetables instead of meat in common dishes like chili or lasagna.  Experiment with different cooking methods. Try roasting or broiling vegetables instead of steaming or sauteing them.  Add frozen vegetables to soups, stews, pasta, or rice.  Add nuts or seeds for added healthy fat at each meal. You can add these to yogurt, salads, or vegetable dishes.  Marinate fish or vegetables using olive oil, lemon juice, garlic, and fresh herbs. Meal planning  Plan to eat 1 vegetarian meal one day each week. Try to work up to 2 vegetarian meals, if possible.  Eat seafood 2 or more times a week.  Have healthy snacks readily available, such as: ? Vegetable sticks with hummus. ? [place] yogurt. ? Fruit and nut trail mix.  Eat balanced meals throughout the week. This includes: ? Fruit: 2-3 servings a day ? Vegetables: 4-5 servings a day ? Low-fat dairy: 2 servings a day ? Fish, poultry, or lean meat: 1 serving a day ? Beans and legumes: 2 or more servings a week ? Nuts and seeds: 1-2 servings a day ? Whole grains: 6-8 servings a day ? Extra-virgin olive oil: 3-4 servings a day  Limit red meat and sweets to only a few servings a month What are my  food choices?  Mediterranean diet ? Recommended ? Grains: Whole-grain pasta. Brown rice. Bulgar wheat. Polenta. Couscous. Whole-wheat bread. [place]. ? Vegetables: Artichokes. Beets. Broccoli. Cabbage. Carrots. Eggplant. Green beans. Chard. Kale. Spinach. Onions. Leeks. Peas. Squash. Tomatoes. Peppers. Radishes. ? Fruits: Apples. Apricots. Avocado. Berries. Bananas. Cherries. Dates. Figs. Grapes. Lemons. Melon. Oranges. Peaches. Plums. Pomegranate. ? Meats and other protein foods: Beans. Almonds. Sunflower seeds. Pine nuts. Peanuts. [place]. Salmon. Scallops. Shrimp. [place]. Tilapia. Clams. Oysters. Eggs. ? Dairy: Low-fat milk. Cheese. Greek yogurt. ? Beverages: Water. Red wine. Herbal tea. ? Fats and oils: Extra virgin olive oil. Avocado oil. Grape seed oil. ? Sweets and desserts: [place] yogurt with honey. Baked apples. Poached pears. Trail mix. ? Seasoning and other foods: Basil. Cilantro. Coriander. Cumin. Mint. Parsley. Sage. Rosemary. Tarragon. Garlic. Oregano. Thyme. Pepper. Balsalmic vinegar. Tahini. Hummus. Tomato sauce. Olives. Mushrooms. ? Limit these ? Grains: Prepackaged pasta or rice dishes. Prepackaged cereal with added sugar. ? Vegetables: Deep fried potatoes (french fries). ? Fruits: Fruit canned in syrup. ? Meats and other protein foods: Beef. Pork. Lamb. Poultry with skin. Hot dogs. [place] Salines. ? Dairy: Ice cream. Sour cream. Whole milk. ? Beverages: Juice. Sugar-sweetened soft drinks. Beer. Liquor and spirits. ? Fats and oils: Butter. Canola oil. Vegetable oil. Beef fat (tallow). Lard. ? Sweets and desserts: Cookies. Cakes. Pies. Candy. ? Seasoning and other foods: Mayonnaise. Premade sauces and marinades. ? The items listed may not be a complete list. Talk with your dietitian about what dietary choices are right for you. Summary  The Mediterranean diet includes both food and lifestyle choices.  Eat a variety of fresh fruits and vegetables, beans, nuts, seeds, and whole  grains.  Limit the amount of red meat and sweets that you eat.  Talk with your health care provider about whether it is safe for you to drink red wine in moderation. This means 1 glass a day for nonpregnant women and 2 glasses a day for men. A glass of wine equals 5 oz (150 mL). This information is not intended to replace advice given to you by your health care provider. Make sure you discuss any questions you have with your health care provider. Document Released: 01/18/2016 Document Revised: 02/20/2016 Document Reviewed: 01/18/2016 Elsevier Interactive Patient Education  [employer].

## 2018-04-17 ENCOUNTER — Ambulatory Visit: Payer: Medicare Other | Admitting: Podiatry

## 2018-04-22 DIAGNOSIS — N200 Calculus of kidney: Secondary | ICD-10-CM | POA: Diagnosis not present

## 2018-04-27 ENCOUNTER — Encounter: Payer: Self-pay | Admitting: Podiatry

## 2018-04-27 ENCOUNTER — Ambulatory Visit (INDEPENDENT_AMBULATORY_CARE_PROVIDER_SITE_OTHER): Payer: Medicare Other | Admitting: Podiatry

## 2018-04-27 ENCOUNTER — Other Ambulatory Visit: Payer: Self-pay | Admitting: Podiatry

## 2018-04-27 ENCOUNTER — Ambulatory Visit (INDEPENDENT_AMBULATORY_CARE_PROVIDER_SITE_OTHER): Payer: Medicare Other

## 2018-04-27 VITALS — BP 140/86 | HR 79

## 2018-04-27 DIAGNOSIS — M79671 Pain in right foot: Secondary | ICD-10-CM

## 2018-04-27 DIAGNOSIS — M7661 Achilles tendinitis, right leg: Secondary | ICD-10-CM | POA: Diagnosis not present

## 2018-04-27 DIAGNOSIS — I639 Cerebral infarction, unspecified: Secondary | ICD-10-CM

## 2018-04-27 DIAGNOSIS — M79672 Pain in left foot: Secondary | ICD-10-CM

## 2018-04-27 MED ORDER — METHYLPREDNISOLONE 4 MG PO TBPK: ORAL_TABLET | ORAL | 0 refills | Status: DC

## 2018-04-29 NOTE — Progress Notes (Signed)
   HPI: 72 year old male presenting today as a new patient with a chief complaint of intermittent pain of the right heel that began 2-3 months ago. Standing after sitting for long periods of time increases the pain. He has not done anything for treatment. Patient is here for further evaluation and treatment.   Past Medical History:  Diagnosis Date  . Adenomatous polyp   . Bleeding internal hemorrhoids 10/2017  . Cerebral venous thrombosis   . Grade I diastolic dysfunction 36/46/8032   noted on ECHO   . Hemorrhoids   . History of kidney stones   . Hypercholesteremia   . Hypertension   . Intracranial hemorrhage (HCC)    Right temporal lobe  . LVH (left ventricular hypertrophy) 07/11/2017   Mild, noted on ECHO   . Muscular degeneration   . Nephrolithiasis   . Obesity   . OSA (obstructive sleep apnea)   . Polycythemia   . Prostate cancer ([place]) 2003  . Shingles   . Stroke ([place]) 06/2017   No residual  . Unsteady gait   . Ureteral calculi 02/12/2008   Left      Physical Exam: General: The patient is alert and oriented x3 in no acute distress.  Dermatology: Skin is warm, dry and supple bilateral lower extremities. Negative for open lesions or macerations.  Vascular: Palpable pedal pulses bilaterally. No edema or erythema noted. Capillary refill within normal limits.  Neurological: Epicritic and protective threshold grossly intact bilaterally.   Musculoskeletal Exam: Pain on palpation noted to the posterior tubercle of the right calcaneus at the insertion of the Achilles tendon consistent with retrocalcaneal bursitis. Range of motion within normal limits. Muscle strength 5/5 in all muscle groups bilateral lower extremities.  Radiographic Exam:  Posterior calcaneal spur noted to the respective calcaneus on lateral view. No fracture or dislocation noted. Normal osseous mineralization noted.     Assessment: 1. Insertional Achilles tendinitis right 2. Posterior heel spur right     Plan of Care:  1. Patient was evaluated. Radiographs were reviewed today. 2. Recommended daily stretching.  3. Prescription for Medrol Dose Pak provided to patient. 4. Recommended good shoe gear.  5. Return to clinic in 4 weeks.    Edrick Kins, DPM [facility]  Dr. Edrick Kins, [address]                                        [place], [place] [address]                Office [phone]  Fax [phone]

## 2018-05-18 ENCOUNTER — Other Ambulatory Visit: Payer: Self-pay | Admitting: Neurology

## 2018-05-25 ENCOUNTER — Ambulatory Visit: Payer: Medicare Other | Admitting: Podiatry

## 2018-05-27 ENCOUNTER — Ambulatory Visit (INDEPENDENT_AMBULATORY_CARE_PROVIDER_SITE_OTHER): Payer: Medicare Other | Admitting: Podiatry

## 2018-05-27 ENCOUNTER — Encounter: Payer: Self-pay | Admitting: Podiatry

## 2018-05-27 DIAGNOSIS — M7661 Achilles tendinitis, right leg: Secondary | ICD-10-CM | POA: Diagnosis not present

## 2018-05-28 ENCOUNTER — Ambulatory Visit: Payer: Medicare Other | Admitting: Adult Health

## 2018-05-29 NOTE — Progress Notes (Signed)
   HPI: 72 year old male presenting today for follow up evaluation of Achilles tendinitis of the right lower extremity. He states his pain has improved. He notes intermittent moderate pain that is not as bad as it was previously. He took the Medrol Dose Pak and has been stretching the area daily. There are no modifying factors noted. Patient is here for further evaluation and treatment.   Past Medical History:  Diagnosis Date  . Adenomatous polyp   . Bleeding internal hemorrhoids 10/2017  . Cerebral venous thrombosis   . Grade I diastolic dysfunction 75/17/0017   noted on ECHO   . Hemorrhoids   . History of kidney stones   . Hypercholesteremia   . Hypertension   . Intracranial hemorrhage (HCC)    Right temporal lobe  . LVH (left ventricular hypertrophy) 07/11/2017   Mild, noted on ECHO   . Muscular degeneration   . Nephrolithiasis   . Obesity   . OSA (obstructive sleep apnea)   . Polycythemia   . Prostate cancer ([place]) 2003  . Shingles   . Stroke ([place]) 06/2017   No residual  . Unsteady gait   . Ureteral calculi 02/12/2008   Left      Physical Exam: General: The patient is alert and oriented x3 in no acute distress.  Dermatology: Skin is warm, dry and supple bilateral lower extremities. Negative for open lesions or macerations.  Vascular: Palpable pedal pulses bilaterally. No edema or erythema noted. Capillary refill within normal limits.  Neurological: Epicritic and protective threshold grossly intact bilaterally.   Musculoskeletal Exam: Pain on palpation noted to the posterior tubercle of the right calcaneus at the insertion of the Achilles tendon consistent with retrocalcaneal bursitis. Range of motion within normal limits. Muscle strength 5/5 in all muscle groups bilateral lower extremities.  Assessment: 1. Insertional Achilles tendinitis right - improved 2. Posterior heel spur right    Plan of Care:  1. Patient was evaluated.  2. Continue daily stretching.  3.  Silicone heel sleeve dispensed.  4. Cannot take NSAIDs. On Eliquis due to stroke 06/2017.  5. Return to clinic as needed.    Edrick Kins, DPM [facility]  Dr. Edrick Kins, [place]                                        [place], [place] [address]                Office [phone]  Fax [phone]

## 2018-07-15 DIAGNOSIS — N200 Calculus of kidney: Secondary | ICD-10-CM | POA: Diagnosis not present

## 2018-07-15 DIAGNOSIS — N135 Crossing vessel and stricture of ureter without hydronephrosis: Secondary | ICD-10-CM | POA: Diagnosis not present

## 2018-07-16 DIAGNOSIS — D224 Melanocytic nevi of scalp and neck: Secondary | ICD-10-CM | POA: Diagnosis not present

## 2018-07-16 DIAGNOSIS — D2239 Melanocytic nevi of other parts of face: Secondary | ICD-10-CM | POA: Diagnosis not present

## 2018-07-16 DIAGNOSIS — L570 Actinic keratosis: Secondary | ICD-10-CM | POA: Diagnosis not present

## 2018-07-16 DIAGNOSIS — D1801 Hemangioma of skin and subcutaneous tissue: Secondary | ICD-10-CM | POA: Diagnosis not present

## 2018-07-16 DIAGNOSIS — L821 Other seborrheic keratosis: Secondary | ICD-10-CM | POA: Diagnosis not present

## 2018-07-16 DIAGNOSIS — D485 Neoplasm of uncertain behavior of skin: Secondary | ICD-10-CM | POA: Diagnosis not present

## 2018-07-29 DIAGNOSIS — I10 Essential (primary) hypertension: Secondary | ICD-10-CM | POA: Diagnosis not present

## 2018-07-29 DIAGNOSIS — Z23 Encounter for immunization: Secondary | ICD-10-CM | POA: Diagnosis not present

## 2018-08-03 DIAGNOSIS — H353132 Nonexudative age-related macular degeneration, bilateral, intermediate dry stage: Secondary | ICD-10-CM | POA: Diagnosis not present

## 2018-08-03 DIAGNOSIS — H33312 Horseshoe tear of retina without detachment, left eye: Secondary | ICD-10-CM | POA: Diagnosis not present

## 2018-08-03 DIAGNOSIS — H43812 Vitreous degeneration, left eye: Secondary | ICD-10-CM | POA: Diagnosis not present

## 2018-08-03 DIAGNOSIS — H353212 Exudative age-related macular degeneration, right eye, with inactive choroidal neovascularization: Secondary | ICD-10-CM | POA: Diagnosis not present

## 2018-08-06 DIAGNOSIS — H353221 Exudative age-related macular degeneration, left eye, with active choroidal neovascularization: Secondary | ICD-10-CM | POA: Diagnosis not present

## 2018-08-17 DIAGNOSIS — H524 Presbyopia: Secondary | ICD-10-CM | POA: Diagnosis not present

## 2018-08-17 DIAGNOSIS — H353112 Nonexudative age-related macular degeneration, right eye, intermediate dry stage: Secondary | ICD-10-CM | POA: Diagnosis not present

## 2018-08-17 DIAGNOSIS — Z961 Presence of intraocular lens: Secondary | ICD-10-CM | POA: Diagnosis not present

## 2018-08-17 DIAGNOSIS — H43813 Vitreous degeneration, bilateral: Secondary | ICD-10-CM | POA: Diagnosis not present

## 2018-09-14 DIAGNOSIS — H33312 Horseshoe tear of retina without detachment, left eye: Secondary | ICD-10-CM | POA: Diagnosis not present

## 2018-09-14 DIAGNOSIS — H43812 Vitreous degeneration, left eye: Secondary | ICD-10-CM | POA: Diagnosis not present

## 2018-09-14 DIAGNOSIS — H353132 Nonexudative age-related macular degeneration, bilateral, intermediate dry stage: Secondary | ICD-10-CM | POA: Diagnosis not present

## 2018-09-14 DIAGNOSIS — H353221 Exudative age-related macular degeneration, left eye, with active choroidal neovascularization: Secondary | ICD-10-CM | POA: Diagnosis not present

## 2018-09-15 ENCOUNTER — Other Ambulatory Visit: Payer: Self-pay | Admitting: Neurology

## 2018-09-15 ENCOUNTER — Telehealth: Payer: Self-pay | Admitting: Neurology

## 2018-09-15 MED ORDER — ATORVASTATIN CALCIUM 20 MG PO TABS: 20.0000 mg | ORAL_TABLET | Freq: Every day | ORAL | 1 refills | Status: DC

## 2018-09-15 NOTE — Telephone Encounter (Signed)
Called and confirmed with Pt the pharmacy he wanted is [facility]. Pt needed 30 day supply from local [facility], and 90 day sent to mail order.

## 2018-09-15 NOTE — Telephone Encounter (Signed)
[facility]ill on the atorvastatin calcium [facility]. Thanks!

## 2018-09-24 ENCOUNTER — Telehealth: Payer: Self-pay | Admitting: Neurology

## 2018-09-24 NOTE — Telephone Encounter (Signed)
Refill of  the lisinopril 20mg  to the [facility]. 3 month supply. Please send. Thanks!

## 2018-09-24 NOTE — Telephone Encounter (Signed)
Called patient back to let him know that Candise Che, NP ordered this.  The phone number was to a fax machine.

## 2018-10-14 ENCOUNTER — Ambulatory Visit: Payer: Medicare Other | Admitting: Neurology

## 2018-10-22 DIAGNOSIS — H353132 Nonexudative age-related macular degeneration, bilateral, intermediate dry stage: Secondary | ICD-10-CM | POA: Diagnosis not present

## 2018-10-22 DIAGNOSIS — H353221 Exudative age-related macular degeneration, left eye, with active choroidal neovascularization: Secondary | ICD-10-CM | POA: Diagnosis not present

## 2018-11-26 DIAGNOSIS — H353221 Exudative age-related macular degeneration, left eye, with active choroidal neovascularization: Secondary | ICD-10-CM | POA: Diagnosis not present

## 2018-11-26 DIAGNOSIS — H43812 Vitreous degeneration, left eye: Secondary | ICD-10-CM | POA: Diagnosis not present

## 2018-12-07 NOTE — Progress Notes (Signed)
° ° °  Virtual Visit via Telephone Note The purpose of this virtual visit is to provide medical care while limiting exposure to the novel coronavirus.    Consent was obtained for phone visit:  Yes.   Answered questions that patient had about telehealth interaction:  Yes.   I discussed the limitations, risks, security and privacy concerns of performing an evaluation and management service by telephone. I also discussed with the patient that there may be a patient responsible charge related to this service. The patient expressed understanding and agreed to proceed.  Pt location: Home Physician Location: office Name of referring provider:  [name], MD I connected with .Justin Adams at patients initiation/request on 12/08/2018 at  2:10 PM EDT by telephone and verified that I am speaking with the correct person using two identifiers.  Pt MRN:  [id number] Pt DOB:  May 04, 1946   History of Present Illness:  Justin Adams is a 73 year old right-handed male with hypertension, hypercholesterolemia, and OSA and history of prostate cancer who follows up regarding cerebral venous thrombosis.   UPDATE: Medications include:  Eliquis, amlodipine 2.5mg , lisinopril 20mg , atorvastatin 20mg   He is doing well.  No headaches.  HISTORY: He was admitted to [facility] on 07/09/17 for right frontal headache and nausea.  MRI and MRV of head showed right temporal intracerebral hemorrhage with thromboses of the right transverse sinus, sigmoid sinus, and right jugular vein, which was confirmed on diagnostic angiogram.  MRA of the head showed no intracranial arterial abnormality such as aneurysm.  CT of the chest abdomen and pelvis revealed no malignancy.  2D echocardiogram revealed ejection fraction of 55%.  DRVVT borderline elevated at 49.7 but otherwise hypercoagulable panel was negative.  LDL was 103 and hemoglobin A1c was 5.3.  He has history of polycythemia with Hgb level 17.3-17.5 going back  several years.  Hgb on day of admission was 17.5.  Subsequent Hgb levels have been around 16.  He was started on IV heparin and was subsequently transitioned to Eliquis.  He was also discharged on Lipitor 20 mg daily.  He later had a CTV on 09/05/17 which demonstrated interval resolution of the right temporal lobe intraparenchymal hemorrhage with residual encephalomalacia and interval improvement/parital recanalization of right-sided dural venous thrombosis with persistent nonocclusive thrombus within the right transverse and sigmoid sinuses and proximal right internal jugular vein.  Observations/Objective:   There were no vitals taken for this visit. No acute distress.  Alert and oriented.  Speech fluent and not dysarthric.  Language intact.    Assessment and Plan:   1.  Cerebral venous thrombosis with right temporal intracerebral hemorrhage, unclear etiology. 2.  Polycythemia 3.  Hypertension 4.  Hyperlipidemia 5.  OSA  Plan: 1.  Remain on long-term anticoagulation 2.  Continue statin therapy 3.  Continue blood pressure control 4.  Continue exercise 5.  Recommend Mediterranean diet 6.  Continue CPAP for OSA 7.  Follow up in one year  Follow Up Instructions:    -I discussed the assessment and treatment plan with the patient. The patient was provided an opportunity to ask questions and all were answered. The patient agreed with the plan and demonstrated an understanding of the instructions.   The patient was advised to call back or seek an in-person evaluation if the symptoms worsen or if the condition fails to improve as anticipated.    Total Time spent in visit with the patient was:  10 minutes   Dudley Major, DO

## 2018-12-08 ENCOUNTER — Encounter: Payer: Self-pay | Admitting: Neurology

## 2018-12-08 ENCOUNTER — Telehealth (INDEPENDENT_AMBULATORY_CARE_PROVIDER_SITE_OTHER): Payer: Medicare Other | Admitting: Neurology

## 2018-12-08 ENCOUNTER — Other Ambulatory Visit: Payer: Self-pay

## 2018-12-08 VITALS — BP 118/65 | Temp 97.7°F | Ht 74.5 in | Wt 235.0 lb

## 2018-12-08 DIAGNOSIS — G08 Intracranial and intraspinal phlebitis and thrombophlebitis: Secondary | ICD-10-CM | POA: Diagnosis not present

## 2018-12-08 DIAGNOSIS — E785 Hyperlipidemia, unspecified: Secondary | ICD-10-CM

## 2018-12-08 DIAGNOSIS — I10 Essential (primary) hypertension: Secondary | ICD-10-CM

## 2018-12-08 DIAGNOSIS — G4733 Obstructive sleep apnea (adult) (pediatric): Secondary | ICD-10-CM

## 2018-12-08 DIAGNOSIS — I611 Nontraumatic intracerebral hemorrhage in hemisphere, cortical: Secondary | ICD-10-CM

## 2019-01-06 DIAGNOSIS — H35362 Drusen (degenerative) of macula, left eye: Secondary | ICD-10-CM | POA: Diagnosis not present

## 2019-01-06 DIAGNOSIS — H33312 Horseshoe tear of retina without detachment, left eye: Secondary | ICD-10-CM | POA: Diagnosis not present

## 2019-01-06 DIAGNOSIS — H353221 Exudative age-related macular degeneration, left eye, with active choroidal neovascularization: Secondary | ICD-10-CM | POA: Diagnosis not present

## 2019-01-06 DIAGNOSIS — H43812 Vitreous degeneration, left eye: Secondary | ICD-10-CM | POA: Diagnosis not present

## 2019-02-02 ENCOUNTER — Other Ambulatory Visit: Payer: Self-pay

## 2019-02-02 ENCOUNTER — Telehealth: Payer: Self-pay | Admitting: Neurology

## 2019-02-02 DIAGNOSIS — E785 Hyperlipidemia, unspecified: Secondary | ICD-10-CM

## 2019-02-02 MED ORDER — AMLODIPINE BESYLATE 2.5 MG PO TABS: 2.5000 mg | ORAL_TABLET | Freq: Every day | ORAL | 1 refills | Status: DC | PRN

## 2019-02-02 MED ORDER — APIXABAN 5 MG PO TABS: 5.0000 mg | ORAL_TABLET | Freq: Two times a day (BID) | ORAL | 3 refills | Status: DC

## 2019-02-02 NOTE — Telephone Encounter (Signed)
Patient is calling in about a refill on the Eliquis and amlodipine to the [facility]. Please send this in. Thanks!

## 2019-02-03 DIAGNOSIS — Z79899 Other long term (current) drug therapy: Secondary | ICD-10-CM | POA: Diagnosis not present

## 2019-02-03 DIAGNOSIS — Z0000 Encounter for general adult medical examination without abnormal findings: Secondary | ICD-10-CM | POA: Diagnosis not present

## 2019-02-03 DIAGNOSIS — G4733 Obstructive sleep apnea (adult) (pediatric): Secondary | ICD-10-CM | POA: Diagnosis not present

## 2019-02-03 DIAGNOSIS — R269 Unspecified abnormalities of gait and mobility: Secondary | ICD-10-CM | POA: Diagnosis not present

## 2019-02-03 DIAGNOSIS — I10 Essential (primary) hypertension: Secondary | ICD-10-CM | POA: Diagnosis not present

## 2019-02-03 DIAGNOSIS — Z1389 Encounter for screening for other disorder: Secondary | ICD-10-CM | POA: Diagnosis not present

## 2019-02-03 DIAGNOSIS — E7800 Pure hypercholesterolemia, unspecified: Secondary | ICD-10-CM | POA: Diagnosis not present

## 2019-02-18 NOTE — Telephone Encounter (Signed)
Patient called and requesting his prescriptions in 3 month increments instead for mail order for: amlodipine and atorvastatin. Please confirm the names of the medications with the patient, I had a hard time understanding him.

## 2019-02-24 DIAGNOSIS — H43812 Vitreous degeneration, left eye: Secondary | ICD-10-CM | POA: Diagnosis not present

## 2019-02-24 DIAGNOSIS — H353221 Exudative age-related macular degeneration, left eye, with active choroidal neovascularization: Secondary | ICD-10-CM | POA: Diagnosis not present

## 2019-03-01 ENCOUNTER — Ambulatory Visit: Payer: Medicare Other | Attending: Geriatric Medicine | Admitting: Physical Therapy

## 2019-03-01 ENCOUNTER — Other Ambulatory Visit: Payer: Self-pay

## 2019-03-01 DIAGNOSIS — R2681 Unsteadiness on feet: Secondary | ICD-10-CM | POA: Insufficient documentation

## 2019-03-01 DIAGNOSIS — M6281 Muscle weakness (generalized): Secondary | ICD-10-CM | POA: Diagnosis not present

## 2019-03-01 MED ORDER — ATORVASTATIN CALCIUM 20 MG PO TABS: 20.0000 mg | ORAL_TABLET | Freq: Every day | ORAL | 3 refills | Status: DC

## 2019-03-01 NOTE — Telephone Encounter (Signed)
Patient is wanting to speak to someone about his refills on the Eliquis and amlodipine or atorvastatin. He wants 3 refill per each RX [facility]. He does not want to have to keep calling about this every month he. He wants a call back about this he has left several messages about this and no one has gotten back to him please call

## 2019-03-01 NOTE — Patient Instructions (Signed)
Access Code: [email or web address]  URL: [email or web address]  Date: 03/01/2019  Prepared by: Elsie Ra   Exercises  Backward Walking with Counter Support - 3-5 reps - 1 sets - 2x daily - 6x weekly  Walking Tandem Stance - 10 reps - 3 sets - 2x daily - 6x weekly  Standing Single Leg Stance with Counter Support - 10 reps - 3 sets - 2x daily - 6x weekly  Sit to Stand with Arm Reach Toward [employer] - 5 reps - 1-2 sets - 2x daily - 6x weekly  Step Up - 10 reps - 2 sets - 2x daily - 6x weekly

## 2019-03-01 NOTE — Telephone Encounter (Addendum)
Called and spoke to patient and told him I am sending the Atorvastatin 20mg  daily to [facility] today.  Told patient per chart on 8/25 Elspeth Cho sent in 6 mos of the Eliquis 5mg  BID AND Amlodipine 2.5 mg PRN if SBP>110 (sent 90 tabs with one refill).  Patient stated he did not need those he just needed the Atorvastatin 20mg  daily sent in. Sent to [facility] 90 tabs with 3 refills. Patient aware.

## 2019-03-02 ENCOUNTER — Encounter: Payer: Self-pay | Admitting: Physical Therapy

## 2019-03-02 NOTE — Therapy (Signed)
[facility] [address] [place], [place], [address] Phone: [phone]   Fax:  [phone]  Physical Therapy Evaluation  Patient Details  Name: Justin Adams MRN: [id number] Date of Birth: Aug 14, 1945 Referring Provider (PT): [name], MD   Encounter Date: 03/01/2019  PT End of Session - 03/02/19 0806    Visit Number  1    Number of Visits  12    Date for PT Re-Evaluation  04/13/19    Authorization Type  MCR AARP    PT Start Time  1155    PT Stop Time  1240    PT Time Calculation (min)  45 min    Activity Tolerance  Patient tolerated treatment well    Behavior During Therapy  [facility] for tasks assessed/performed       Past Medical History:  Diagnosis Date  . Adenomatous polyp   . Bleeding internal hemorrhoids 10/2017  . Cerebral venous thrombosis   . Grade I diastolic dysfunction 0000000   noted on ECHO   . Hemorrhoids   . History of kidney stones   . Hypercholesteremia   . Hypertension   . Intracranial hemorrhage (HCC)    Right temporal lobe  . LVH (left ventricular hypertrophy) 07/11/2017   Mild, noted on ECHO   . Muscular degeneration   . Nephrolithiasis   . Obesity   . OSA (obstructive sleep apnea)   . Polycythemia   . Prostate cancer ([place]) 2003  . Shingles   . Stroke ([place]) 06/2017   No residual  . Unsteady gait   . Ureteral calculi 02/12/2008   Left    Past Surgical History:  Procedure Laterality Date  . APPENDECTOMY     childhood  . CHOLECYSTECTOMY    . COLONOSCOPY    . HEMORRHOID BANDING  2015  . HOLMIUM LASER APPLICATION Left [id number]   Procedure: HOLMIUM LASER APPLICATION;  Surgeon: Franchot Gallo, MD;  Location: [facility];  Service: Urology;  Laterality: Left;  . IR ANGIO EXTERNAL CAROTID SEL EXT CAROTID UNI R MOD SED  07/10/2017  . IR ANGIO INTRA EXTRACRAN SEL INTERNAL CAROTID BILAT MOD SED  07/10/2017  . IR ANGIO VERTEBRAL SEL VERTEBRAL BILAT MOD SED  07/10/2017  . IR  URETERAL STENT LEFT NEW ACCESS W/O SEP NEPHROSTOMY CATH  03/12/2018  . LEFT HEART CATHETERIZATION WITH CORONARY ANGIOGRAM N/A 03/15/2013   Procedure: LEFT HEART CATHETERIZATION WITH CORONARY ANGIOGRAM;  Surgeon: Minus Breeding, MD;  Location: [facility] CATH LAB;  Service: Cardiovascular;  Laterality: N/A;  . NEPHROLITHOTOMY Left 03/12/2018   Procedure: LEFT NEPHROLITHOTOMY PERCUTANEOUS, STENT PLACEMENT;  Surgeon: Franchot Gallo, MD;  Location: [facility];  Service: Urology;  Laterality: Left;  . PROSTATECTOMY    . STONE EXTRACTION WITH BASKET      There were no vitals filed for this visit.   Subjective Assessment - 03/02/19 0759    Subjective  feels he has recovered from stroke last year, he gets of balance at times. Has steps but can do them if he goes slow and holds on. He denies pain. He denies any dizziness or difficuly getting in or out of bed. HIs biggest complaint is his legs are getting weaker and he feels unsteady at times walking    Pertinent History  PMH: HTN, CVA 06/2017, prostate Ca 2003,    Patient Stated Goals  improve gait and conditioning    Currently in Pain?  No/denies         [facility] PT Assessment - 03/02/19 0001  Assessment   Medical Diagnosis  Unsteady Gait    Referring Provider (PT)  [name], MD    Onset Date/Surgical Date  --   6 months of gait unsteadiness   Hand Dominance  Right    Next MD Visit  nothing scheduled      Precautions   Precautions  None      Balance Screen   Has the patient fallen in the past 6 months  No      [place] residence      Prior Function   Level of Independence  Independent      Cognition   Overall Cognitive Status  Within Functional Limits for tasks assessed      Sensation   Light Touch  Appears Intact      Coordination   Gross Motor Movements are Fluid and Coordinated  Yes      ROM / Strength   AROM / PROM / Strength  AROM;Strength      Strength   Overall Strength Comments   Rt hip overall 4+/5 except flexion 4/5, Lt hip 4+/5, knee strength 5/5, Rt ankle 4/5, Lt ankle 5/5.       Transfers   Transfers  Independent with all Transfers      Ambulation/Gait   Ambulation/Gait  Yes    Ambulation/Gait Assistance  5: Supervision    Ambulation Distance (Feet)  115 Feet    Gait Comments  mild unsteadiness      Standardized Balance Assessment   Standardized Balance Assessment  Berg Balance Test;Timed Up and Go Test;Five Times Sit to Stand      Berg Balance Test   Sit to Stand  Able to stand  independently using hands    Standing Unsupported  Able to stand safely 2 minutes    Sitting with Back Unsupported but Feet Supported on Floor or Stool  Able to sit safely and securely 2 minutes    Stand to Sit  Controls descent by using hands    Transfers  Able to transfer safely, definite need of hands    Standing Unsupported with Eyes Closed  Able to stand 10 seconds with supervision    Standing Unsupported with Feet Together  Able to place feet together independently and stand for 1 minute with supervision    From Standing, Reach Forward with Outstretched Arm  Can reach confidently >25 cm (10")    From Standing Position, Pick up Object from Floor  Able to pick up shoe safely and easily    From Standing Position, Turn to Look Behind Over each Shoulder  Looks behind from both sides and weight shifts well    Turn 360 Degrees  Able to turn 360 degrees safely in 4 seconds or less    Standing Unsupported, Alternately Place Feet on Step/Stool  Able to stand independently and safely and complete 8 steps in 20 seconds    Standing Unsupported, One Foot in Front  Able to plae foot ahead of the other independently and hold 30 seconds    Standing on One Leg  Able to lift leg independently and hold 5-10 seconds    Total Score  49      Functional Gait  Assessment   Gait assessed   Yes    Gait Level Surface  Walks 20 ft in less than 5.5 sec, no assistive devices, good speed, no evidence for  imbalance, normal gait pattern, deviates no more than 6 in outside of  the 12 in walkway width.    Change in Gait Speed  Able to smoothly change walking speed without loss of balance or gait deviation. Deviate no more than 6 in outside of the 12 in walkway width.    Gait with Horizontal Head Turns  Performs head turns smoothly with no change in gait. Deviates no more than 6 in outside 12 in walkway width    Gait with Vertical Head Turns  Performs head turns with no change in gait. Deviates no more than 6 in outside 12 in walkway width.    Gait and Pivot Turn  Pivot turns safely within 3 sec and stops quickly with no loss of balance.    Step Over Obstacle  Is able to step over 2 stacked shoe boxes taped together (9 in total height) without changing gait speed. No evidence of imbalance.    Gait with Narrow Base of Support  Ambulates 7-9 steps.    Gait with Eyes Closed  Walks 20 ft, uses assistive device, slower speed, mild gait deviations, deviates 6-10 in outside 12 in walkway width. Ambulates 20 ft in less than 9 sec but greater than 7 sec.    Ambulating Backwards  Walks 20 ft, uses assistive device, slower speed, mild gait deviations, deviates 6-10 in outside 12 in walkway width.    Steps  Alternating feet, no rail.    Total Score  27                Objective measurements completed on examination: See above findings.              PT Education - 03/02/19 [id number]    Education Details  HEP, POC, exam findings    Person(s) Educated  Patient    Methods  Explanation;Demonstration;Verbal cues;Handout    Comprehension  Verbalized understanding          PT Long Term Goals - 03/02/19 0813      PT LONG TERM GOAL #1   Title  Pt will report performing HEP, walking program and transition to gym at end of PT    Time  6    Period  Weeks    Status  New      PT LONG TERM GOAL #2   Title  Pt will increaese overall leg strength to at least 4+ to 5- overall to improve function     Time  6    Period  Weeks    Status  New      PT LONG TERM GOAL #3   Title  Will improve BERG to at least 53 to show improved balance    Baseline  49    Time  6    Period  Weeks    Status  New      PT LONG TERM GOAL #4   Title  Will improve FGA to at least 29 to lower falls risk    Baseline  27    Time  6    Period  Weeks    Status  New      PT LONG TERM GOAL #5   Title  Will improve 6 minute walk test distance by 250' (from eval)    Baseline  will do next session    Time  6    Period  Weeks    Status  New             Plan - 03/02/19 0807    Clinical Impression Statement  Pt  presents with mild gait unsteadiness, decreased overall endurance and decreaesd leg strength (Rt worse than Lt). He has had previous CVA has overall recovered quite well from this. He will benefit from skilled PT to address these deficits.    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    Clinical Decision Making  Moderate    Rehab Potential  Good    PT Frequency  2x / week    PT Duration  6 weeks    PT Treatment/Interventions  Aquatic Therapy;Cryotherapy;Electrical Stimulation;Moist Heat;Gait training;Stair training;Functional mobility training;Therapeutic activities;Therapeutic exercise;Balance training;Neuromuscular re-education;Manual techniques;Passive range of motion;Taping;Joint Manipulations    PT Next Visit Plan  review HEP, needs gait, balance, LE strength and conditioning    PT Home Exercise Plan  Access Code: [email or web address]    Consulted and Agree with Plan of Care  Patient       Patient will benefit from skilled therapeutic intervention in order to improve the following deficits and  impairments:  Abnormal gait, Decreased activity tolerance, Decreased balance, Decreased endurance, Decreased strength, Difficulty walking  Visit Diagnosis: Unsteadiness on feet  Muscle weakness (generalized)     Problem List Patient Active Problem List   Diagnosis Date Noted  . Calculus of kidney 03/12/2018  . Bleeding internal hemorrhoids 10/09/2017  . Sleep apnea 09/22/2017  . History of adenomatous polyp of colon 09/18/2017  . Prostate cancer ([place]) 09/18/2017  . Essential hypertension 08/26/2017  . Hyperlipidemia 08/26/2017  . Cerebral venous thrombosis 07/13/2017  . Intracerebral hemorrhage 07/10/2017  . ICH (intracerebral hemorrhage) ([place]) 07/10/2017  . Unstable angina [facility]) 03/12/2013    Silvestre Mesi 03/02/2019, 8:18 AM  [facility] [address] [place], [place], [address] Phone: [phone]   Fax:  [phone]  Name: Justin Adams MRN: [id number] Date of Birth: [birth year]

## 2019-03-03 ENCOUNTER — Other Ambulatory Visit: Payer: Self-pay

## 2019-03-03 DIAGNOSIS — Z23 Encounter for immunization: Secondary | ICD-10-CM | POA: Diagnosis not present

## 2019-03-03 DIAGNOSIS — E785 Hyperlipidemia, unspecified: Secondary | ICD-10-CM

## 2019-03-03 MED ORDER — ATORVASTATIN CALCIUM 20 MG PO TABS: 20.0000 mg | ORAL_TABLET | Freq: Every day | ORAL | 3 refills | Status: DC

## 2019-03-16 ENCOUNTER — Ambulatory Visit: Payer: Medicare Other | Attending: Geriatric Medicine | Admitting: Physical Therapy

## 2019-03-16 ENCOUNTER — Other Ambulatory Visit: Payer: Self-pay

## 2019-03-16 DIAGNOSIS — G8929 Other chronic pain: Secondary | ICD-10-CM | POA: Diagnosis not present

## 2019-03-16 DIAGNOSIS — R29818 Other symptoms and signs involving the nervous system: Secondary | ICD-10-CM | POA: Diagnosis not present

## 2019-03-16 DIAGNOSIS — M6281 Muscle weakness (generalized): Secondary | ICD-10-CM | POA: Insufficient documentation

## 2019-03-16 DIAGNOSIS — M545 Low back pain: Secondary | ICD-10-CM | POA: Diagnosis not present

## 2019-03-16 DIAGNOSIS — R2681 Unsteadiness on feet: Secondary | ICD-10-CM

## 2019-03-16 DIAGNOSIS — I69154 Hemiplegia and hemiparesis following nontraumatic intracerebral hemorrhage affecting left non-dominant side: Secondary | ICD-10-CM | POA: Diagnosis not present

## 2019-03-16 NOTE — Therapy (Signed)
[facility], [place], [address] Phone: [phone]   Fax:  [phone]  Physical Therapy Treatment  Patient Details  Name: Justin Adams MRN: [id number] Date of Birth: 05/26/1946 Referring Provider (PT): [name], MD   Encounter Date: 03/16/2019  PT End of Session - 03/16/19 0926    Visit Number  2    Number of Visits  12    Date for PT Re-Evaluation  04/13/19    Authorization Type  MCR AARP    PT Start Time  0830    PT Stop Time  0915    PT Time Calculation (min)  45 min    Activity Tolerance  Patient tolerated treatment well    Behavior During Therapy  [facility] for tasks assessed/performed       Past Medical History:  Diagnosis Date  . Adenomatous polyp   . Bleeding internal hemorrhoids 10/2017  . Cerebral venous thrombosis   . Grade I diastolic dysfunction 0000000   noted on ECHO   . Hemorrhoids   . History of kidney stones   . Hypercholesteremia   . Hypertension   . Intracranial hemorrhage (HCC)    Right temporal lobe  . LVH (left ventricular hypertrophy) 07/11/2017   Mild, noted on ECHO   . Muscular degeneration   . Nephrolithiasis   . Obesity   . OSA (obstructive sleep apnea)   . Polycythemia   . Prostate cancer ([place]) 2003  . Shingles   . Stroke ([place]) 06/2017   No residual  . Unsteady gait   . Ureteral calculi 02/12/2008   Left    Past Surgical History:  Procedure Laterality Date  . APPENDECTOMY     childhood  . CHOLECYSTECTOMY    . COLONOSCOPY    . HEMORRHOID BANDING  2015  . HOLMIUM LASER APPLICATION Left [id number]   Procedure: HOLMIUM LASER APPLICATION;  Surgeon: Franchot Gallo, MD;  [facility];  Service: Urology;  Laterality: Left;  . IR ANGIO EXTERNAL CAROTID SEL EXT CAROTID UNI R MOD SED  07/10/2017  . IR ANGIO INTRA EXTRACRAN SEL INTERNAL CAROTID BILAT MOD SED  07/10/2017  . IR ANGIO VERTEBRAL SEL VERTEBRAL BILAT MOD SED  07/10/2017  . IR URETERAL  STENT LEFT NEW ACCESS W/O SEP NEPHROSTOMY CATH  03/12/2018  . LEFT HEART CATHETERIZATION WITH CORONARY ANGIOGRAM N/A 03/15/2013   Procedure: LEFT HEART CATHETERIZATION WITH CORONARY ANGIOGRAM;  Surgeon: Minus Breeding, MD;  Location: [facility] CATH LAB;  Service: Cardiovascular;  Laterality: N/A;  . NEPHROLITHOTOMY Left 03/12/2018   Procedure: LEFT NEPHROLITHOTOMY PERCUTANEOUS, STENT PLACEMENT;  Surgeon: Franchot Gallo, MD;  [facility];  Service: Urology;  Laterality: Left;  . PROSTATECTOMY    . STONE EXTRACTION WITH BASKET      There were no vitals filed for this visit.  Subjective Assessment - 03/16/19 0845    Subjective  No pain upon arrival, does still feel week and unsteady. He has been doing HEP so far.    Pertinent History  PMH: HTN, CVA 06/2017, prostate Ca 2003,    Patient Stated Goals  improve gait and conditioning        [facility] Adult PT Treatment/Exercise - 03/16/19 0001      High Level Balance   High Level Balance Comments  in bars: tandem walk, retro walk, march walk, sidestepping over hurldes up/down X 3 ea, then balance on airex feet together eyes open and feet apart eyes closed      Exercises  Exercises  Knee/Hip      Knee/Hip Exercises: Aerobic   Tread Mill  1.2-1.6 MPH for 5 min      Knee/Hip Exercises: Machines for Strengthening   Cybex Knee Extension  10 lbs 2X10    Cybex Knee Flexion  35 lbs 2X10    Cybex Leg Press  65 lbs 3X10    Other Machine  row machine 20 lbs X 20 reps X 2 sets (one at each grip) then lat pull 20 lbs X 20 reps, then chest press 25lbs 2X 20 reps      Knee/Hip Exercises: Standing   Wall Squat Limitations  1/4 squat on wall lean on ball X 10    Stairs  up/down X 5 no UE support             PT Education - 03/16/19 0926    Education Details  HEP review    Person(s) Educated  Patient    Methods  Explanation;Demonstration;Verbal cues    Comprehension  Verbalized understanding;Returned demonstration          PT Long Term  Goals - 03/02/19 0813      PT LONG TERM GOAL #1   Title  Pt will report performing HEP, walking program and transition to gym at end of PT    Time  6    Period  Weeks    Status  New      PT LONG TERM GOAL #2   Title  Pt will increaese overall leg strength to at least 4+ to 5- overall to improve function    Time  6    Period  Weeks    Status  New      PT LONG TERM GOAL #3   Title  Will improve BERG to at least 53 to show improved balance    Baseline  49    Time  6    Period  Weeks    Status  New      PT LONG TERM GOAL #4   Title  Will improve FGA to at least 29 to lower falls risk    Baseline  27    Time  6    Period  Weeks    Status  New      PT LONG TERM GOAL #5   Title  Will improve 6 minute walk test distance by 250' (from eval)    Baseline  will do next session    Time  [place] - 03/16/19 0927    Clinical Impression Statement  Session focused on dynamic balance and leg strength with good tolerance and he appeared more steady today than in Eval. PT will continue to progress this as able    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    Rehab Potential  Good    PT Frequency  2x / week    PT Duration  6 weeks    PT Treatment/Interventions  Aquatic Therapy;Cryotherapy;Electrical Stimulation;Moist Heat;Gait training;Stair training;Functional mobility training;Therapeutic activities;Therapeutic exercise;Balance training;Neuromuscular re-education;Manual techniques;Passive range of motion;Taping;Joint Manipulations    PT Next Visit Plan  update HEP PRN, needs gait, balance, LE strength  and conditioning    PT Home Exercise  Plan  Access Code: [email or web address]    Consulted and Agree with Plan of Care  Patient       Patient will benefit from skilled therapeutic intervention in order to improve the following deficits and impairments:  Abnormal gait, Decreased activity tolerance, Decreased balance, Decreased endurance, Decreased strength, Difficulty walking  Visit Diagnosis: Unsteadiness on feet  Muscle weakness (generalized)  Other symptoms and signs involving the nervous system     Problem List Patient Active Problem List   Diagnosis Date Noted  . Calculus of kidney 03/12/2018  . Bleeding internal hemorrhoids 10/09/2017  . Sleep apnea 09/22/2017  . History of adenomatous polyp of colon 09/18/2017  . Prostate cancer ([place]) 09/18/2017  . Essential hypertension 08/26/2017  . Hyperlipidemia 08/26/2017  . Cerebral venous thrombosis 07/13/2017  . Intracerebral hemorrhage 07/10/2017  . ICH (intracerebral hemorrhage) ([place]) 07/10/2017  . Unstable angina ([place]) 03/12/2013    Silvestre Mesi 03/16/2019, 9:30 AM  [facility] [address] [place], [place], [address] Phone: [phone]   Fax:  [phone]  Name: SEGUNDO NEVEL MRN: [id number] Date of Birth: Nov 06, 1945

## 2019-03-18 ENCOUNTER — Ambulatory Visit: Payer: Medicare Other | Admitting: Physical Therapy

## 2019-03-18 ENCOUNTER — Other Ambulatory Visit: Payer: Self-pay

## 2019-03-18 DIAGNOSIS — M545 Low back pain: Secondary | ICD-10-CM | POA: Diagnosis not present

## 2019-03-18 DIAGNOSIS — I69154 Hemiplegia and hemiparesis following nontraumatic intracerebral hemorrhage affecting left non-dominant side: Secondary | ICD-10-CM | POA: Diagnosis not present

## 2019-03-18 DIAGNOSIS — R2681 Unsteadiness on feet: Secondary | ICD-10-CM

## 2019-03-18 DIAGNOSIS — M6281 Muscle weakness (generalized): Secondary | ICD-10-CM

## 2019-03-18 DIAGNOSIS — R29818 Other symptoms and signs involving the nervous system: Secondary | ICD-10-CM

## 2019-03-18 DIAGNOSIS — G8929 Other chronic pain: Secondary | ICD-10-CM | POA: Diagnosis not present

## 2019-03-18 NOTE — Therapy (Signed)
[place], [place], [address] Phone: [phone]   Fax:  [phone]  Physical Therapy Treatment  Patient Details  Name: Justin Adams MRN: [id number] Date of Birth: 12-21-1945 Referring Provider (PT): [name], MD   Encounter Date: 03/18/2019  PT End of Session - 03/18/19 0911    Visit Number  3    Number of Visits  12    Date for PT Re-Evaluation  04/13/19    Authorization Type  MCR AARP    PT Start Time  0830    PT Stop Time  0915    PT Time Calculation (min)  45 min    Activity Tolerance  Patient tolerated treatment well    Behavior During Therapy  [employer] for tasks assessed/performed       Past Medical History:  Diagnosis Date  . Adenomatous polyp   . Bleeding internal hemorrhoids 10/2017  . Cerebral venous thrombosis   . Grade I diastolic dysfunction 0000000   noted on ECHO   . Hemorrhoids   . History of kidney stones   . Hypercholesteremia   . Hypertension   . Intracranial hemorrhage (HCC)    Right temporal lobe  . LVH (left ventricular hypertrophy) 07/11/2017   Mild, noted on ECHO   . Muscular degeneration   . Nephrolithiasis   . Obesity   . OSA (obstructive sleep apnea)   . Polycythemia   . Prostate cancer ([place]) 2003  . Shingles   . Stroke ([place]) 06/2017   No residual  . Unsteady gait   . Ureteral calculi 02/12/2008   Left    Past Surgical History:  Procedure Laterality Date  . APPENDECTOMY     childhood  . CHOLECYSTECTOMY    . COLONOSCOPY    . HEMORRHOID BANDING  2015  . HOLMIUM LASER APPLICATION Left [id number]   Procedure: HOLMIUM LASER APPLICATION;  Surgeon: Franchot Gallo, MD;  Location: [facility];  Service: Urology;  Laterality: Left;  . IR ANGIO EXTERNAL CAROTID SEL EXT CAROTID UNI R MOD SED  07/10/2017  . IR ANGIO INTRA EXTRACRAN SEL INTERNAL CAROTID BILAT MOD SED  07/10/2017  . IR ANGIO VERTEBRAL SEL VERTEBRAL BILAT MOD SED  07/10/2017  . IR URETERAL  STENT LEFT NEW ACCESS W/O SEP NEPHROSTOMY CATH  03/12/2018  . LEFT HEART CATHETERIZATION WITH CORONARY ANGIOGRAM N/A 03/15/2013   Procedure: LEFT HEART CATHETERIZATION WITH CORONARY ANGIOGRAM;  Surgeon: Minus Breeding, MD;  Location: [facility] CATH LAB;  Service: Cardiovascular;  Laterality: N/A;  . NEPHROLITHOTOMY Left 03/12/2018   Procedure: LEFT NEPHROLITHOTOMY PERCUTANEOUS, STENT PLACEMENT;  Surgeon: Franchot Gallo, MD;  Location: [facility];  Service: Urology;  Laterality: Left;  . PROSTATECTOMY    . STONE EXTRACTION WITH BASKET      There were no vitals filed for this visit.  Subjective Assessment - 03/18/19 0852    Subjective  No pain upon arrival, no significant changes yet but he can now stand up from chair only using one hand    Pertinent History  PMH: HTN, CVA 06/2017, prostate Ca 2003,    Patient Stated Goals  improve gait and conditioning    Currently in Pain?  No/denies        [facility] Adult PT Treatment/Exercise - 03/18/19 0001      High Level Balance   High Level Balance Comments  in bars: tandem walk and retro tandem walk up/down X 4,march walk,  up/down X 3 ea, then walking with head turns up/down  X3  then SLS      Knee/Hip Exercises: Stretches   Gastroc Stretch Limitations  slantboard 30 sec X 2      Knee/Hip Exercises: Aerobic   Tread Mill  1.4 to 1.8 MPH for 6 min      Knee/Hip Exercises: Machines for Strengthening   Cybex Knee Extension  15 lbs 2X10    Cybex Knee Flexion  35 lbs 2X10    Cybex Leg Press  65 lbs 2X15    Other Machine  row machine 25lbs X 15reps X 2 sets (one at each grip) then lat pull 25 lbs X 20 reps, then chest press 25lbs 2X 20 reps      Knee/Hip Exercises: Standing   Lateral Step Up  Both;10 reps;Step Height: 6";Hand Hold: 0    Forward Step Up  Both;15 reps;Hand Hold: 0;Step Height: 6"    Wall Squat Limitations  1/4 squat on wall lean on ball  2X 10        PT Long Term Goals - 03/02/19 0813      PT LONG TERM GOAL #1   Title  Pt will report  performing HEP, walking program and transition to gym at end of PT    Time  6    Period  Weeks    Status  New      PT LONG TERM GOAL #2   Title  Pt will increaese overall leg strength to at least 4+ to 5- overall to improve function    Time  6    Period  Weeks    Status  New      PT LONG TERM GOAL #3   Title  Will improve BERG to at least 53 to show improved balance    Baseline  49    Time  6    Period  Weeks    Status  New      PT LONG TERM GOAL #4   Title  Will improve FGA to at least 29 to lower falls risk    Baseline  27    Time  6    Period  Weeks    Status  New      PT LONG TERM GOAL #5   Title  Will improve 6 minute walk test distance by 250' (from eval)    Baseline  will do next session    Time  6    Period  Weeks    Status  New        Plan - 03/18/19 0911    Clinical Impression Statement  Continued to focus on general strength, endurance, and dynamic balance without complaints. He does require rest breaks during session due to fatigue. PT will continue to progress as able.    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    Rehab Potential  Good    PT Frequency  2x / week    PT Duration  6 weeks    PT Treatment/Interventions  Aquatic Therapy;Cryotherapy;Electrical Stimulation;Moist Heat;Gait training;Stair training;Functional mobility training;Therapeutic activities;Therapeutic exercise;Balance training;Neuromuscular re-education;Manual techniques;Passive range of motion;Taping;Joint Manipulations    PT Next Visit Plan  update HEP PRN, needs gait, balance, LE strength and conditioning    PT Home Exercise Plan  Access Code: [email or web address]    Consulted and  Agree with  Plan of Care  Patient       Patient will benefit from skilled therapeutic intervention in order to improve the following deficits and impairments:  Abnormal gait, Decreased activity tolerance, Decreased balance, Decreased endurance, Decreased strength, Difficulty walking  Visit Diagnosis: Unsteadiness on feet  Muscle weakness (generalized)  Other symptoms and signs involving the nervous system     Problem List Patient Active Problem List   Diagnosis Date Noted  . Calculus of kidney 03/12/2018  . Bleeding internal hemorrhoids 10/09/2017  . Sleep apnea 09/22/2017  . History of adenomatous polyp of colon 09/18/2017  . Prostate cancer ([place]) 09/18/2017  . Essential hypertension 08/26/2017  . Hyperlipidemia 08/26/2017  . Cerebral venous thrombosis 07/13/2017  . Intracerebral hemorrhage 07/10/2017  . ICH (intracerebral hemorrhage) ([place]) 07/10/2017  . Unstable angina [facility]) 03/12/2013    Silvestre Mesi 03/18/2019, 9:14 AM  [facility] [address] [place], [place], [address] Phone: [phone]   Fax:  [phone]  Name: Justin Adams MRN: [id number] Date of Birth: 1945-06-25

## 2019-03-23 ENCOUNTER — Ambulatory Visit: Payer: Medicare Other | Admitting: Physical Therapy

## 2019-03-23 ENCOUNTER — Other Ambulatory Visit: Payer: Self-pay

## 2019-03-23 DIAGNOSIS — M545 Low back pain: Secondary | ICD-10-CM | POA: Diagnosis not present

## 2019-03-23 DIAGNOSIS — I69154 Hemiplegia and hemiparesis following nontraumatic intracerebral hemorrhage affecting left non-dominant side: Secondary | ICD-10-CM | POA: Diagnosis not present

## 2019-03-23 DIAGNOSIS — R29818 Other symptoms and signs involving the nervous system: Secondary | ICD-10-CM | POA: Diagnosis not present

## 2019-03-23 DIAGNOSIS — R2681 Unsteadiness on feet: Secondary | ICD-10-CM | POA: Diagnosis not present

## 2019-03-23 DIAGNOSIS — G8929 Other chronic pain: Secondary | ICD-10-CM | POA: Diagnosis not present

## 2019-03-23 DIAGNOSIS — M6281 Muscle weakness (generalized): Secondary | ICD-10-CM | POA: Diagnosis not present

## 2019-03-23 NOTE — Therapy (Signed)
[place], [place], [address] Phone: [phone]   Fax:  [phone]  Physical Therapy Treatment  Patient Details  Name: Justin Adams MRN: [id number] Date of Birth: 15-Sep-1945 Referring Provider (PT): [name], MD   Encounter Date: 03/23/2019  PT End of Session - 03/23/19 0933    Visit Number  3    Number of Visits  12    Date for PT Re-Evaluation  04/13/19    Authorization Type  MCR AARP    PT Start Time  0830    PT Stop Time  0915    PT Time Calculation (min)  45 min    Activity Tolerance  Patient tolerated treatment well    Behavior During Therapy  [facility] for tasks assessed/performed       Past Medical History:  Diagnosis Date  . Adenomatous polyp   . Bleeding internal hemorrhoids 10/2017  . Cerebral venous thrombosis   . Grade I diastolic dysfunction 0000000   noted on ECHO   . Hemorrhoids   . History of kidney stones   . Hypercholesteremia   . Hypertension   . Intracranial hemorrhage (HCC)    Right temporal lobe  . LVH (left ventricular hypertrophy) 07/11/2017   Mild, noted on ECHO   . Muscular degeneration   . Nephrolithiasis   . Obesity   . OSA (obstructive sleep apnea)   . Polycythemia   . Prostate cancer ([place]) 2003  . Shingles   . Stroke ([place]) 06/2017   No residual  . Unsteady gait   . Ureteral calculi 02/12/2008   Left    Past Surgical History:  Procedure Laterality Date  . APPENDECTOMY     childhood  . CHOLECYSTECTOMY    . COLONOSCOPY    . HEMORRHOID BANDING  2015  . HOLMIUM LASER APPLICATION Left [id number]   Procedure: HOLMIUM LASER APPLICATION;  Surgeon: Franchot Gallo, MD;  [facility];  Service: Urology;  Laterality: Left;  . IR ANGIO EXTERNAL CAROTID SEL EXT CAROTID UNI R MOD SED  07/10/2017  . IR ANGIO INTRA EXTRACRAN SEL INTERNAL CAROTID BILAT MOD SED  07/10/2017  . IR ANGIO VERTEBRAL SEL VERTEBRAL BILAT MOD SED  07/10/2017  . IR URETERAL  STENT LEFT NEW ACCESS W/O SEP NEPHROSTOMY CATH  03/12/2018  . LEFT HEART CATHETERIZATION WITH CORONARY ANGIOGRAM N/A 03/15/2013   Procedure: LEFT HEART CATHETERIZATION WITH CORONARY ANGIOGRAM;  Surgeon: Minus Breeding, MD;  Location[facility] C[facility] LAB;  Service: Cardiovascular;  Laterality: N/A;  . NEPHROLITHOTOMY Left 03/12/2018   Procedure: LEFT NEPHROLITHOTOMY PERCUTANEOUS, STENT PLACEMENT;  Surgeon: Franchot Gallo, MD;  [facility];  Service: Urology;  Laterality: Left;  . PROSTATECTOMY    . STONE EXTRACTION WITH BASKET      There were no vitals filed for this visit.  Subjective Assessment - 03/23/19 0839    Subjective  No pain but tired today, did not get much sleep last night, almost cancelled today.    Pertinent History  PMH: HTN, CVA 06/2017, prostate Ca 2003,    Patient Stated Goals  improve gait and conditioning        [facility] Adult PT Treatment/Exercise - 03/23/19 0001      High Level Balance   High Level Balance Comments  in bars: wobble board ant-post and lateral X 20 ea with one fingertip support, reciprocal stepping over hurldes up/down X 4, sidestepping over hurldles up/down X 3, weaving through hurdles up/down X 3,  Knee/Hip Exercises: Aerobic   Elliptical  2 min but then stopped due to knee pain    Tread Mill  1.6 to 2.0 5 min      Knee/Hip Exercises: Machines for Strengthening   Cybex Knee Extension  15 lbs 2X12    Cybex Knee Flexion  35 lbs 2X12    Cybex Leg Press  65 lbs 2X15    Other Machine  row machine 25lbs X 15reps X 2 sets (one at each grip) then lat pull 25 lbs X 20 reps, then chest press 25lbs 2X 20 reps      Knee/Hip Exercises: Standing   Wall Squat Limitations  1/4 squat on wall lean on ball  2X 12    Other Standing Knee Exercises  sit to stand with airex pad in chair no UE support 2X5           PT Long Term Goals - 03/02/19 0813      PT LONG TERM GOAL #1   Title  Pt will report performing HEP, walking program and transition to gym at end  of PT    Time  6    Period  Weeks    Status  New      PT LONG TERM GOAL #2   Title  Pt will increaese overall leg strength to at least 4+ to 5- overall to improve function    Time  6    Period  Weeks    Status  New      PT LONG TERM GOAL #3   Title  Will improve BERG to at least 53 to show improved balance    Baseline  49    Time  6    Period  Weeks    Status  New      PT LONG TERM GOAL #4   Title  Will improve FGA to at least 29 to lower falls risk    Baseline  27    Time  6    Period  Weeks    Status  New      PT LONG TERM GOAL #5   Title  Will improve 6 minute walk test distance by 250' (from eval)    Baseline  will do next session    Time  6    Period  Weeks    Status  New            Plan - 03/23/19 [id number]    Clinical Impression Statement  Able to progress today with strength, endurance and balance as tolerated. Had to modify a little today due to knee pain. Continue POC    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    Rehab Potential  Good    PT Frequency  2x / week    PT Duration  6 weeks    PT Treatment/Interventions  Aquatic Therapy;Cryotherapy;Electrical Stimulation;Moist Heat;Gait training;Stair training;Functional mobility training;Therapeutic activities;Therapeutic exercise;Balance training;Neuromuscular re-education;Manual techniques;Passive range of motion;Taping;Joint Manipulations    PT Next Visit Plan  update HEP PRN, needs gait, balance, LE strength and conditioning    PT Home Exercise Plan  Access Code: [email or web address]    Consulted and Agree with Plan of Care  Patient       Patient will benefit from skilled therapeutic  intervention in order to  improve the following deficits and impairments:  Abnormal gait, Decreased activity tolerance, Decreased balance, Decreased endurance, Decreased strength, Difficulty walking  Visit Diagnosis: Unsteadiness on feet  Muscle weakness (generalized)     Problem List Patient Active Problem List   Diagnosis Date Noted  . Calculus of kidney 03/12/2018  . Bleeding internal hemorrhoids 10/09/2017  . Sleep apnea 09/22/2017  . History of adenomatous polyp of colon 09/18/2017  . Prostate cancer ([place]) 09/18/2017  . Essential hypertension 08/26/2017  . Hyperlipidemia 08/26/2017  . Cerebral venous thrombosis 07/13/2017  . Intracerebral hemorrhage 07/10/2017  . ICH (intracerebral hemorrhage) ([place]) 07/10/2017  . Unstable angina [facility]) 03/12/2013    Silvestre Mesi 03/23/2019, 9:52 AM  [facility] [address] [place], [place], [address] Phone: [phone]   Fax:  [phone]  Name: Justin Adams MRN: [id number] Date of Birth: 1945-12-02

## 2019-03-25 ENCOUNTER — Other Ambulatory Visit: Payer: Self-pay

## 2019-03-25 ENCOUNTER — Ambulatory Visit: Payer: Medicare Other | Admitting: Physical Therapy

## 2019-03-25 DIAGNOSIS — G8929 Other chronic pain: Secondary | ICD-10-CM | POA: Diagnosis not present

## 2019-03-25 DIAGNOSIS — M545 Low back pain: Secondary | ICD-10-CM | POA: Diagnosis not present

## 2019-03-25 DIAGNOSIS — R29818 Other symptoms and signs involving the nervous system: Secondary | ICD-10-CM

## 2019-03-25 DIAGNOSIS — R2681 Unsteadiness on feet: Secondary | ICD-10-CM | POA: Diagnosis not present

## 2019-03-25 DIAGNOSIS — M6281 Muscle weakness (generalized): Secondary | ICD-10-CM | POA: Diagnosis not present

## 2019-03-25 DIAGNOSIS — I69154 Hemiplegia and hemiparesis following nontraumatic intracerebral hemorrhage affecting left non-dominant side: Secondary | ICD-10-CM | POA: Diagnosis not present

## 2019-03-25 NOTE — Therapy (Signed)
[place], [place], [address] Phone: [phone]   Fax:  [phone]  Physical Therapy Treatment  Patient Details  Name: Justin Adams MRN: [id number] Date of Birth: 1945-08-06 Referring Provider (PT): [name], MD   Encounter Date: 03/25/2019  PT End of Session - 03/25/19 0951    Visit Number  4    Number of Visits  12    Date for PT Re-Evaluation  04/13/19    Authorization Type  MCR AARP    PT Start Time  0830    PT Stop Time  0915    PT Time Calculation (min)  45 min    Activity Tolerance  Patient tolerated treatment well    Behavior During Therapy  [facility] for tasks assessed/performed       Past Medical History:  Diagnosis Date  . Adenomatous polyp   . Bleeding internal hemorrhoids 10/2017  . Cerebral venous thrombosis   . Grade I diastolic dysfunction 0000000   noted on ECHO   . Hemorrhoids   . History of kidney stones   . Hypercholesteremia   . Hypertension   . Intracranial hemorrhage (HCC)    Right temporal lobe  . LVH (left ventricular hypertrophy) 07/11/2017   Mild, noted on ECHO   . Muscular degeneration   . Nephrolithiasis   . Obesity   . OSA (obstructive sleep apnea)   . Polycythemia   . Prostate cancer ([place]) 2003  . Shingles   . Stroke ([place]) 06/2017   No residual  . Unsteady gait   . Ureteral calculi 02/12/2008   Left    Past Surgical History:  Procedure Laterality Date  . APPENDECTOMY     childhood  . CHOLECYSTECTOMY    . COLONOSCOPY    . HEMORRHOID BANDING  2015  . HOLMIUM LASER APPLICATION Left [id number]   Procedure: HOLMIUM LASER APPLICATION;  Surgeon: Franchot Gallo, MD;  [facility];  Service: Urology;  Laterality: Left;  . IR ANGIO EXTERNAL CAROTID SEL EXT CAROTID UNI R MOD SED  07/10/2017  . IR ANGIO INTRA EXTRACRAN SEL INTERNAL CAROTID BILAT MOD SED  07/10/2017  . IR ANGIO VERTEBRAL SEL VERTEBRAL BILAT MOD SED  07/10/2017  . IR URETERAL  STENT LEFT NEW ACCESS W/O SEP NEPHROSTOMY CATH  03/12/2018  . LEFT HEART CATHETERIZATION WITH CORONARY ANGIOGRAM N/A 03/15/2013   Procedure: LEFT HEART CATHETERIZATION WITH CORONARY ANGIOGRAM;  Surgeon: Minus Breeding, MD;  Location: [facility] CATH LAB;  Service: Cardiovascular;  Laterality: N/A;  . NEPHROLITHOTOMY Left 03/12/2018   Procedure: LEFT NEPHROLITHOTOMY PERCUTANEOUS, STENT PLACEMENT;  Surgeon: Franchot Gallo, MD;  [facility];  Service: Urology;  Laterality: Left;  . PROSTATECTOMY    . STONE EXTRACTION WITH BASKET      There were no vitals filed for this visit.  Subjective Assessment - 03/25/19 0947    Subjective  Feels pretty good today, no pain just stiffness and still feels off balance at time    Pertinent History  PMH: HTN, CVA 06/2017, prostate Ca 2003,    Patient Stated Goals  improve gait and conditioning    Currently in Pain?  No/denies                       [facility] Adult PT Treatment/Exercise - 03/25/19 0001      High Level Balance   High Level Balance Comments  In hall used wall PRN to steady, performed walking with head turns up/down and lateral,  retro walking, tandem walking, retro tandem walking, walking with eyes closed, [place] all with close supervision      Knee/Hip Exercises: Aerobic   Recumbent Bike  6 min L3      Knee/Hip Exercises: Machines for Strengthening   Cybex Knee Extension  15 lbs 2X15    Cybex Knee Flexion  35 lbs 2X15    Cybex Leg Press  65 lbs 2X15    Other Machine  row machine 25lbs X 15reps X 2 sets (one at each grip) then lat pull 25 lbs X 20 reps, then chest press 25lbs 2X 20 reps      Knee/Hip Exercises: Standing   Other Standing Knee Exercises  sit to stand with airex pad in chair no UE support 2X6                  PT Long Term Goals - 03/02/19 0813      PT LONG TERM GOAL #1   Title  Pt will report performing HEP, walking program and transition to gym at end of PT    Time  6    Period  Weeks    Status   New      PT LONG TERM GOAL #2   Title  Pt will increaese overall leg strength to at least 4+ to 5- overall to improve function    Time  6    Period  Weeks    Status  New      PT LONG TERM GOAL #3   Title  Will improve BERG to at least 53 to show improved balance    Baseline  49    Time  6    Period  Weeks    Status  New      PT LONG TERM GOAL #4   Title  Will improve FGA to at least 29 to lower falls risk    Baseline  27    Time  6    Period  Weeks    Status  New      PT LONG TERM GOAL #5   Title  Will improve 6 minute walk test distance by 250' (from eval)    Baseline  will do next session    Time  6    Period  Weeks    Status  New            Plan - 03/25/19 0951    Clinical Impression Statement  Continued to focus on overall strength and conditioning with progression of balance training. He needs close supervision with increased balance challenges today and uses wall PRN to steady himself. Continue POC    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    Rehab Potential  Good    PT Frequency  2x / week    PT Duration  6 weeks    PT Treatment/Interventions  Aquatic Therapy;Cryotherapy;Electrical Stimulation;Moist Heat;Gait training;Stair training;Functional mobility training;Therapeutic activities;Therapeutic exercise;Balance training;Neuromuscular re-education;Manual techniques;Passive range of motion;Taping;Joint Manipulations    PT Next Visit Plan  update HEP PRN, needs gait, balance, LE strength and conditioning    PT Home Exercise Plan  Access Code: [email or web address]    Consulted and Agree with Plan of Care  Patient       Patient will benefit  from skilled therapeutic  intervention in order to improve the following deficits and impairments:  Abnormal gait, Decreased activity tolerance, Decreased balance, Decreased endurance, Decreased strength, Difficulty walking  Visit Diagnosis: Unsteadiness on feet  Muscle weakness (generalized)  Other symptoms and signs involving the nervous system     Problem List Patient Active Problem List   Diagnosis Date Noted  . Calculus of kidney 03/12/2018  . Bleeding internal hemorrhoids 10/09/2017  . Sleep apnea 09/22/2017  . History of adenomatous polyp of colon 09/18/2017  . Prostate cancer ([place]) 09/18/2017  . Essential hypertension 08/26/2017  . Hyperlipidemia 08/26/2017  . Cerebral venous thrombosis 07/13/2017  . Intracerebral hemorrhage 07/10/2017  . ICH (intracerebral hemorrhage) ([place]) 07/10/2017  . Unstable angina [facility]) 03/12/2013    Silvestre Mesi 03/25/2019, 9:54 AM  [facility] [address] [place], [place], [address] Phone: [phone]   Fax:  [phone]  Name: Justin Adams MRN: [id number] Date of Birth: 07/06/1945

## 2019-03-30 ENCOUNTER — Other Ambulatory Visit: Payer: Self-pay

## 2019-03-30 ENCOUNTER — Ambulatory Visit: Payer: Medicare Other | Admitting: Physical Therapy

## 2019-03-30 DIAGNOSIS — I69154 Hemiplegia and hemiparesis following nontraumatic intracerebral hemorrhage affecting left non-dominant side: Secondary | ICD-10-CM | POA: Diagnosis not present

## 2019-03-30 DIAGNOSIS — M6281 Muscle weakness (generalized): Secondary | ICD-10-CM

## 2019-03-30 DIAGNOSIS — R2681 Unsteadiness on feet: Secondary | ICD-10-CM | POA: Diagnosis not present

## 2019-03-30 DIAGNOSIS — M545 Low back pain: Secondary | ICD-10-CM | POA: Diagnosis not present

## 2019-03-30 DIAGNOSIS — R29818 Other symptoms and signs involving the nervous system: Secondary | ICD-10-CM | POA: Diagnosis not present

## 2019-03-30 DIAGNOSIS — G8929 Other chronic pain: Secondary | ICD-10-CM | POA: Diagnosis not present

## 2019-03-30 NOTE — Therapy (Signed)
[facility] [address] [place], [place], [address] Phone: [phone]   Fax:  [phone]  Physical Therapy Treatment  Patient Details  Name: Justin Adams MRN: [id number] Date of Birth: 31-Mar-1946 Referring Provider (PT): [name], MD   Encounter Date: 03/30/2019  PT End of Session - 03/30/19 1209    Visit Number  5    Number of Visits  12    Date for PT Re-Evaluation  04/13/19    Authorization Type  MCR AARP    PT Start Time  0850    PT Stop Time  0930    PT Time Calculation (min)  40 min    Activity Tolerance  Patient tolerated treatment well    Behavior During Therapy  [facility] for tasks assessed/performed       Past Medical History:  Diagnosis Date  . Adenomatous polyp   . Bleeding internal hemorrhoids 10/2017  . Cerebral venous thrombosis   . Grade I diastolic dysfunction 0000000   noted on ECHO   . Hemorrhoids   . History of kidney stones   . Hypercholesteremia   . Hypertension   . Intracranial hemorrhage (HCC)    Right temporal lobe  . LVH (left ventricular hypertrophy) 07/11/2017   Mild, noted on ECHO   . Muscular degeneration   . Nephrolithiasis   . Obesity   . OSA (obstructive sleep apnea)   . Polycythemia   . Prostate cancer ([place]) 2003  . Shingles   . Stroke ([place]) 06/2017   No residual  . Unsteady gait   . Ureteral calculi 02/12/2008   Left    Past Surgical History:  Procedure Laterality Date  . APPENDECTOMY     childhood  . CHOLECYSTECTOMY    . COLONOSCOPY    . HEMORRHOID BANDING  2015  . HOLMIUM LASER APPLICATION Left [id number]   Procedure: HOLMIUM LASER APPLICATION;  Surgeon: Franchot Gallo, MD;  Location: [facility];  Service: Urology;  Laterality: Left;  . IR ANGIO EXTERNAL CAROTID SEL EXT CAROTID UNI R MOD SED  07/10/2017  . IR ANGIO INTRA EXTRACRAN SEL INTERNAL CAROTID BILAT MOD SED  07/10/2017  . IR ANGIO VERTEBRAL SEL VERTEBRAL BILAT MOD SED  07/10/2017  . IR  URETERAL STENT LEFT NEW ACCESS W/O SEP NEPHROSTOMY CATH  03/12/2018  . LEFT HEART CATHETERIZATION WITH CORONARY ANGIOGRAM N/A 03/15/2013   Procedure: LEFT HEART CATHETERIZATION WITH CORONARY ANGIOGRAM;  Surgeon: Minus Breeding, MD;  Location: [facility] C[facility] LAB;  Service: Cardiovascular;  Laterality: N/A;  . NEPHROLITHOTOMY Left 03/12/2018   Procedure: LEFT NEPHROLITHOTOMY PERCUTANEOUS, STENT PLACEMENT;  Surgeon: Franchot Gallo, MD;  Location: [facility];  Service: Urology;  Laterality: Left;  . PROSTATECTOMY    . STONE EXTRACTION WITH BASKET      There were no vitals filed for this visit.  Subjective Assessment - 03/30/19 1207    Subjective  relays no complaints today    Pertinent History  PMH: HTN, CVA 06/2017, prostate Ca 2003,    Patient Stated Goals  improve gait and conditioning    Currently in Pain?  No/denies      Treatment/Interventions  treadmill 7 min 2.0 mph leg press 60 lbs 3X15 mini lunges X 10 bilat with one UE support  sit to stands no UE from chair with airex pad 2X6 step ups fwd, lat, down on 6 inch without UE support X 10 ea bilat Balance on foam beam for sidestepping and tandem walk up/down X 5 with PRN UE support  on bars. Then tandem balance on airex 30 sec X 2 ea, then balance on airex with feet apart and eyes closed 30 sec X 2, then marches on foam X 10 each side no UE support.     PT Education - 03/30/19 1208    Education Details  updated HEP to include more functional LE strength at home, provided illustrated copy    Person(s) Educated  Patient    Methods  Explanation;Demonstration;Handout    Comprehension  Verbalized understanding;Returned demonstration;Need further instruction          PT Long Term Goals - 03/02/19 0813      PT LONG TERM GOAL #1   Title  Pt will report performing HEP, walking program and transition to gym at end of PT    Time  6    Period  Weeks    Status  New      PT LONG TERM GOAL #2   Title  Pt will increaese overall leg strength to  at least 4+ to 5- overall to improve function    Time  6    Period  Weeks    Status  New      PT LONG TERM GOAL #3   Title  Will improve BERG to at least 53 to show improved balance    Baseline  49    Time  6    Period  Weeks    Status  New      PT LONG TERM GOAL #4   Title  Will improve FGA to at least 29 to lower falls risk    Baseline  27    Time  6    Period  Weeks    Status  New      PT LONG TERM GOAL #5   Title  Will improve 6 minute walk test distance by 250' (from eval)    Baseline  will do next session    Time  6    Period  Weeks    Status  New            Plan - 03/30/19 1209    Clinical Impression Statement  No complaints of knee pain today with increased progression of strengthening program. He was given updated HEP to include more LE dynamic strengthening which he showed good understanding and return demonstraton today.Continue POC    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    Rehab Potential  Good    PT Frequency  2x / week    PT Duration  6 weeks    PT Treatment/Interventions  Aquatic Therapy;Cryotherapy;Electrical Stimulation;Moist Heat;Gait training;Stair training;Functional mobility training;Therapeutic activities;Therapeutic exercise;Balance training;Neuromuscular re-education;Manual techniques;Passive range of motion;Taping;Joint Manipulations    PT Next Visit Plan  assess goals, needs gait, balance, LE strength and conditioning    PT Home Exercise Plan  Access Code: [email or web address], added parial lunges, sit to stands, step ups fwd, lat, retro    Consulted and Agree with Plan of Care  Patient       Patient will benefit from skilled therapeutic  intervention in order to improve the following deficits and impairments:  Abnormal gait, Decreased activity tolerance, Decreased balance, Decreased endurance, Decreased strength, Difficulty walking  Visit Diagnosis: Unsteadiness on feet  Muscle weakness (generalized)  Other symptoms and signs involving the nervous system     Problem List Patient Active Problem List   Diagnosis Date Noted  . Calculus of kidney 03/12/2018  . Bleeding internal hemorrhoids 10/09/2017  . Sleep apnea 09/22/2017  . History of adenomatous polyp of colon 09/18/2017  . Prostate cancer ([place]) 09/18/2017  . Essential hypertension 08/26/2017  . Hyperlipidemia 08/26/2017  . Cerebral venous thrombosis 07/13/2017  . Intracerebral hemorrhage 07/10/2017  . ICH (intracerebral hemorrhage) ([place]) 07/10/2017  . Unstable angina ([place]) 03/12/2013    Silvestre Mesi 03/30/2019, 12:12 PM  [facility] [address] [place], [place], [address] Phone: [phone]   Fax:  [phone]  Name: Justin Adams MRN: [id number] Date of Birth: 1945/08/25

## 2019-04-01 ENCOUNTER — Other Ambulatory Visit: Payer: Self-pay

## 2019-04-01 ENCOUNTER — Ambulatory Visit: Payer: Medicare Other | Admitting: Physical Therapy

## 2019-04-01 DIAGNOSIS — M6281 Muscle weakness (generalized): Secondary | ICD-10-CM

## 2019-04-01 DIAGNOSIS — M545 Low back pain: Secondary | ICD-10-CM | POA: Diagnosis not present

## 2019-04-01 DIAGNOSIS — R2681 Unsteadiness on feet: Secondary | ICD-10-CM | POA: Diagnosis not present

## 2019-04-01 DIAGNOSIS — I69154 Hemiplegia and hemiparesis following nontraumatic intracerebral hemorrhage affecting left non-dominant side: Secondary | ICD-10-CM | POA: Diagnosis not present

## 2019-04-01 DIAGNOSIS — R29818 Other symptoms and signs involving the nervous system: Secondary | ICD-10-CM | POA: Diagnosis not present

## 2019-04-01 DIAGNOSIS — G8929 Other chronic pain: Secondary | ICD-10-CM | POA: Diagnosis not present

## 2019-04-01 NOTE — Therapy (Signed)
[facility] [address] [place], [place], [address] Phone: [phone]   Fax:  [phone]  Physical Therapy Treatment/Re-evaluation and Recert  Patient Details  Name: Justin Adams MRN: [id number] Date of Birth: [birth year] Referring Provider (PT): [name], MD   Encounter Date: 04/01/2019  PT End of Session - 04/01/19 1137    Visit Number  6    Number of Visits  18    Date for PT Re-Evaluation  05/13/19   re-eval and recert performed [id number] to extend POC due to adding in low back   Authorization Type  MCR AARP    PT Start Time  [phone]    PT Stop Time  0930    PT Time Calculation (min)  40 min    Activity Tolerance  Patient tolerated treatment well    Behavior During Therapy  [facility] for tasks assessed/performed       Past Medical History:  Diagnosis Date  . Adenomatous polyp   . Bleeding internal hemorrhoids 10/2017  . Cerebral venous thrombosis   . Grade I diastolic dysfunction 0000000   noted on ECHO   . Hemorrhoids   . History of kidney stones   . Hypercholesteremia   . Hypertension   . Intracranial hemorrhage (HCC)    Right temporal lobe  . LVH (left ventricular hypertrophy) 07/11/2017   Mild, noted on ECHO   . Muscular degeneration   . Nephrolithiasis   . Obesity   . OSA (obstructive sleep apnea)   . Polycythemia   . Prostate cancer ([place]) 2003  . Shingles   . Stroke ([place]) 06/2017   No residual  . Unsteady gait   . Ureteral calculi 02/12/2008   Left    Past Surgical History:  Procedure Laterality Date  . APPENDECTOMY     childhood  . CHOLECYSTECTOMY    . COLONOSCOPY    . HEMORRHOID BANDING  2015  . HOLMIUM LASER APPLICATION Left [id number]   Procedure: HOLMIUM LASER APPLICATION;  Surgeon: Franchot Gallo, MD;  [facility]: [facility];  Service: Urology;  Laterality: Left;  . IR ANGIO EXTERNAL CAROTID SEL EXT CAROTID UNI R MOD SED  07/10/2017  . IR ANGIO INTRA EXTRACRAN SEL  INTERNAL CAROTID BILAT MOD SED  07/10/2017  . IR ANGIO VERTEBRAL SEL VERTEBRAL BILAT MOD SED  07/10/2017  . IR URETERAL STENT LEFT NEW ACCESS W/O SEP NEPHROSTOMY CATH  03/12/2018  . LEFT HEART CATHETERIZATION WITH CORONARY ANGIOGRAM N/A 03/15/2013   Procedure: LEFT HEART CATHETERIZATION WITH CORONARY ANGIOGRAM;  Surgeon: Minus Breeding, MD;  Location: [facility] CATH LAB;  Service: Cardiovascular;  Laterality: N/A;  . NEPHROLITHOTOMY Left 03/12/2018   Procedure: LEFT NEPHROLITHOTOMY PERCUTANEOUS, STENT PLACEMENT;  Surgeon: Franchot Gallo, MD;  Location[facility];  Service: Urology;  Laterality: Left;  . PROSTATECTOMY    . STONE EXTRACTION WITH BASKET      There were no vitals filed for this visit.  Subjective Assessment - 04/01/19 1132    Subjective  Relays back pain today that happens if he stands in one place 5-10 minutes. Wants to be seen in PT for his back so RE-eval performed today to add in his back and this will be sent to his MD to sign off on.    Pertinent History  PMH: HTN, CVA 06/2017, prostate Ca 2003,    Patient Stated Goals  improve gait and conditioning    Currently in Pain?  Yes    Pain Score  4     Pain  Location  Back    Pain Orientation  Lower    Pain Descriptors / Indicators  Aching;Tightness    Pain Type  Chronic pain    Pain Radiating Towards  denies any N/T down his legs    Pain Onset  More than a month ago    Pain Frequency  Intermittent    Aggravating Factors   standing more than 5 min in one position hurts his back    Pain Relieving Factors  sitting    Multiple Pain Sites  No         [facility] PT Assessment - 04/01/19 0001      Assessment   Medical Diagnosis  Unsteady Gait, Re-eval to add in LBP    Referring Provider (PT)  [name], MD      Posture/Postural Control   Posture Comments  anterior pelvic tilt      ROM / Strength   AROM / PROM / Strength  AROM;Strength      AROM   AROM Assessment Site  Lumbar    Lumbar Flexion  75%    Lumbar Extension  75%     Lumbar - Right Side Bend  50%    Lumbar - Left Side Bend  50%    Lumbar - Right Rotation  75%    Lumbar - Left Rotation  75%      Flexibility   Soft Tissue Assessment /Muscle Length  --   tight hips and hamstings,      Palpation   Spinal mobility  mildly decreased      Special Tests   Other special tests  neg slump, neg SLR, neg FABERs, neg quadrant      Berg Balance Test   Sit to Stand  Able to stand  independently using hands    Standing Unsupported  Able to stand safely 2 minutes    Sitting with Back Unsupported but Feet Supported on Floor or Stool  Able to sit safely and securely 2 minutes    Stand to Sit  Sits safely with minimal use of hands    Transfers  Able to transfer safely, minor use of hands    Standing Unsupported with Eyes Closed  Able to stand 10 seconds safely    Standing Unsupported with Feet Together  Able to place feet together independently and stand 1 minute safely    From Standing, Reach Forward with Outstretched Arm  Can reach confidently >25 cm (10")    From Standing Position, Pick up Object from Floor  Able to pick up shoe safely and easily    From Standing Position, Turn to Look Behind Over each Shoulder  Looks behind from both sides and weight shifts well    Turn 360 Degrees  Able to turn 360 degrees safely in 4 seconds or less    Standing Unsupported, Alternately Place Feet on Step/Stool  Able to stand independently and safely and complete 8 steps in 20 seconds    Standing Unsupported, One Foot in Front  Able to plae foot ahead of the other independently and hold 30 seconds    Standing on One Leg  Able to lift leg independently and hold 5-10 seconds    Total Score  53                   [facility] Adult PT Treatment/Exercise - 04/01/19 0001      Exercises   Exercises  Other Exercises    Other Exercises   lumbar stretches:  SKTC, hamstring stretch and hip flexor stretch all 30 sec X 2 bilat, LTR X 10 reps 5 sec each, posterior pelvic tilts X  15, bridges X 10. Leg strength at leg press 60 lbs 3X15, sit to stands no UE support but needs min A with this X 5 reps.      Knee/Hip Exercises: Aerobic   Tread Mill  5 min 2.0 MPH             PT Education - 04/01/19 1137    Education Details  HEP for lumbar    Person(s) Educated  Patient    Methods  Explanation;Demonstration;Verbal cues;Handout    Comprehension  Verbalized understanding;Returned demonstration;Need further instruction          PT Long Term Goals - 04/01/19 1226      PT LONG TERM GOAL #1   Title  Pt will report performing HEP, walking program and transition to gym at end of PT    Time  6    Period  Weeks    Status  On-going      PT LONG TERM GOAL #2   Title  Pt will increaese overall leg strength to at least 4+ to 5- overall to improve function    Time  6    Period  Weeks    Status  On-going      PT LONG TERM GOAL #3   Title  Will improve BERG to at least 53 to show improved balance    Baseline  now 53    Time  6    Period  Weeks    Status  Achieved      PT LONG TERM GOAL #4   Title  Will improve FGA to at least 29 to lower falls risk    Baseline  27    Time  6    Period  Weeks    Status  On-going      PT LONG TERM GOAL #5   Title  Will improve 6 minute walk test distance by 250' (from eval)    Baseline  will do next session    Time  6    Period  Weeks    Status  On-going            Plan - 04/01/19 1222    Clinical Impression Statement  Re-evaluaiton perfomed today due to complaints of back pain. Special testing negative and no red flags. He does have increased tightness, decreased lumbar ROM, anterior pelvic tilt, and decreased core strength. He will benefit from skilled PT to address these deficits. POC will be sent to his referring MD to sign off on and PT will request PT prescription to include low back. Overall he showed improved balance scores today and is progressing well with his overall leg strength, gait and balance.     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    Rehab Potential  Good    PT Frequency  2x / week    PT Duration  6 weeks    PT Treatment/Interventions  Aquatic Therapy;Cryotherapy;Electrical Stimulation;Moist Heat;Gait training;Stair training;Functional mobility training;Therapeutic activities;Therapeutic exercise;Balance training;Neuromuscular re-education;Manual techniques;Passive range of motion;Taping;Joint Manipulations    PT Next Visit Plan  added low back into POC, needs gait, balance, LE strength  and conditioning    PT Home Exercise Plan  Access Code: [email or web address], added parial lunges, sit to stands, step ups fwd, lat, retro, also added LTR, SKTC, pelvic tilts, bridges, hip flexor stretch, HSS    Consulted and Agree with Plan of Care  Patient       Patient will benefit from skilled therapeutic intervention in order to improve the following deficits and impairments:  Abnormal gait, Decreased activity tolerance, Decreased balance, Decreased endurance, Decreased strength, Difficulty walking  Visit Diagnosis: Unsteadiness on feet  Muscle weakness (generalized)  Hemiplegia and hemiparesis following nontraumatic intracerebral hemorrhage affecting left non-dominant side (HCC)  Chronic bilateral low back pain without sciatica     Problem List Patient Active Problem List   Diagnosis Date Noted  . Calculus of kidney 03/12/2018  . Bleeding internal hemorrhoids 10/09/2017  . Sleep apnea 09/22/2017  . History of adenomatous polyp of colon 09/18/2017  . Prostate cancer ([place]) 09/18/2017  . Essential hypertension 08/26/2017  . Hyperlipidemia 08/26/2017  . Cerebral venous thrombosis  07/13/2017  . Intracerebral hemorrhage 07/10/2017  . ICH (intracerebral hemorrhage) ([place]) 07/10/2017  . Unstable angina ([place]) 03/12/2013    Debbe Odea, PT,DPT 04/01/2019, 12:27 PM  [facility] [address] [place], [place], [address] Phone: [phone]   Fax:  [phone]  Name: Justin Adams MRN: [id number] Date of Birth: August 27, 1945

## 2019-04-01 NOTE — Patient Instructions (Signed)
Access Code: [email or web address]  URL: [email or web address]  Date: 04/01/2019  Prepared by: Elsie Ra   Exercises  Seated Hamstring Stretch - 3 sets - 30 hold - 2x daily - 6x weekly  Supine Lower Trunk Rotation - 10 reps - 1 sets - 5 hold - 2x daily - 6x weekly  Supine Single Knee to Chest - 3 sets - 30 hold - 2x daily - 6x weekly  Supine Posterior Pelvic Tilt - 10 reps - 3 sets - 2x daily - 6x weekly  Supine Bridge - 10 reps - 1-2 sets - 5 hold - 2x daily - 6x weekly  Standing Hip Flexor Stretch - 10 reps - 3 sets - 2x daily - 6x weekly

## 2019-04-06 ENCOUNTER — Ambulatory Visit: Payer: Medicare Other | Admitting: Physical Therapy

## 2019-04-06 ENCOUNTER — Other Ambulatory Visit: Payer: Self-pay

## 2019-04-06 DIAGNOSIS — M6281 Muscle weakness (generalized): Secondary | ICD-10-CM | POA: Diagnosis not present

## 2019-04-06 DIAGNOSIS — R2681 Unsteadiness on feet: Secondary | ICD-10-CM

## 2019-04-06 DIAGNOSIS — M545 Low back pain: Secondary | ICD-10-CM

## 2019-04-06 DIAGNOSIS — G8929 Other chronic pain: Secondary | ICD-10-CM

## 2019-04-06 DIAGNOSIS — I69154 Hemiplegia and hemiparesis following nontraumatic intracerebral hemorrhage affecting left non-dominant side: Secondary | ICD-10-CM | POA: Diagnosis not present

## 2019-04-06 DIAGNOSIS — R29818 Other symptoms and signs involving the nervous system: Secondary | ICD-10-CM | POA: Diagnosis not present

## 2019-04-06 NOTE — Therapy (Signed)
[facility] [address] [place], [place], [address] Phone: [phone]   Fax:  [phone]  Physical Therapy Treatment  Patient Details  Name: PERMAN YAHOLA MRN: [id number] Date of Birth: July 15, 1945 Referring Provider (PT): [name], MD   Encounter Date: 04/06/2019  PT End of Session - 04/06/19 1359    Visit Number  7    Number of Visits  18    Date for PT Re-Evaluation  05/13/19   re-eval and recert performed 123XX123 to extend POC due to adding in low back   Authorization Type  MCR AARP    PT Start Time  1315    PT Stop Time  1400    PT Time Calculation (min)  45 min    Activity Tolerance  Patient tolerated treatment well    Behavior During Therapy  [facility] for tasks assessed/performed       Past Medical History:  Diagnosis Date  . Adenomatous polyp   . Bleeding internal hemorrhoids 10/2017  . Cerebral venous thrombosis   . Grade I diastolic dysfunction 0000000   noted on ECHO   . Hemorrhoids   . History of kidney stones   . Hypercholesteremia   . Hypertension   . Intracranial hemorrhage (HCC)    Right temporal lobe  . LVH (left ventricular hypertrophy) 07/11/2017   Mild, noted on ECHO   . Muscular degeneration   . Nephrolithiasis   . Obesity   . OSA (obstructive sleep apnea)   . Polycythemia   . Prostate cancer ([place]) 2003  . Shingles   . Stroke ([place]) 06/2017   No residual  . Unsteady gait   . Ureteral calculi 02/12/2008   Left    Past Surgical History:  Procedure Laterality Date  . APPENDECTOMY     childhood  . CHOLECYSTECTOMY    . COLONOSCOPY    . HEMORRHOID BANDING  2015  . HOLMIUM LASER APPLICATION Left [id number]   Procedure: HOLMIUM LASER APPLICATION;  Surgeon: Franchot Gallo, MD;  Location: [facility];  Service: Urology;  Laterality: Left;  . IR ANGIO EXTERNAL CAROTID SEL EXT CAROTID UNI R MOD SED  07/10/2017  . IR ANGIO INTRA EXTRACRAN SEL INTERNAL CAROTID BILAT MOD SED   07/10/2017  . IR ANGIO VERTEBRAL SEL VERTEBRAL BILAT MOD SED  07/10/2017  . IR URETERAL STENT LEFT NEW ACCESS W/O SEP NEPHROSTOMY CATH  03/12/2018  . LEFT HEART CATHETERIZATION WITH CORONARY ANGIOGRAM N/A 03/15/2013   Procedure: LEFT HEART CATHETERIZATION WITH CORONARY ANGIOGRAM;  Surgeon: Minus Breeding, MD;  Location: [facility] CATH LAB;  Service: Cardiovascular;  Laterality: N/A;  . NEPHROLITHOTOMY Left 03/12/2018   Procedure: LEFT NEPHROLITHOTOMY PERCUTANEOUS, STENT PLACEMENT;  Surgeon: Franchot Gallo, MD;  [facility];  Service: Urology;  Laterality: Left;  . PROSTATECTOMY    . STONE EXTRACTION WITH BASKET      There were no vitals filed for this visit.  Subjective Assessment - 04/06/19 1344    Subjective  Relays he continues to have back pain, its not bad unless he stands 5-10 minutes in one spot then it gets 5-8/10.    Pertinent History  PMH: HTN, CVA 06/2017, prostate Ca 2003,    Patient Stated Goals  improve gait and conditioning    Pain Onset  More than a month ago                       [facility] Adult PT Treatment/Exercise - 04/06/19 0001  Exercises   Other Exercises   lumbar stretches: SKTC, hamstring stretch and hip flexor stretch all 30 sec X 2 bilat, LTR X 10 reps 5 sec each,  bridges  5sec X 15. Leg strength at leg press 60 lbs 3X15, sit to stands no UE X 5 reps from low mat table, beginning deadlift from low blue bench 5 lbs  x15 with cues and demo for technique and body mechanics. Balance SLS cone taps      Knee/Hip Exercises: Aerobic   Tread Mill  6 min 2.0 MPH warm up                  PT Long Term Goals - 04/01/19 1226      PT LONG TERM GOAL #1   Title  Pt will report performing HEP, walking program and transition to gym at end of PT    Time  6    Period  Weeks    Status  On-going      PT LONG TERM GOAL #2   Title  Pt will increaese overall leg strength to at least 4+ to 5- overall to improve function    Time  6    Period  Weeks     Status  On-going      PT LONG TERM GOAL #3   Title  Will improve BERG to at least 53 to show improved balance    Baseline  now 53    Time  6    Period  Weeks    Status  Achieved      PT LONG TERM GOAL #4   Title  Will improve FGA to at least 29 to lower falls risk    Baseline  27    Time  6    Period  Weeks    Status  On-going      PT LONG TERM GOAL #5   Title  Will improve 6 minute walk test distance by 250' (from eval)    Baseline  will do next session    Time  6    Period  Weeks    Status  On-going            Plan - 04/06/19 1359    Clinical Impression Statement  Session focused on lumbar stretching and lumbar/leg strengthening with good tolerance and no complaints. He is doing overall well with balance. PT will continue to progress as able.    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    Rehab Potential  Good    PT Frequency  2x / week    PT Duration  6 weeks    PT Treatment/Interventions  Aquatic Therapy;Cryotherapy;Electrical Stimulation;Moist Heat;Gait training;Stair training;Functional mobility training;Therapeutic activities;Therapeutic exercise;Balance training;Neuromuscular re-education;Manual techniques;Passive range of motion;Taping;Joint Manipulations    PT Next Visit Plan  added low back into POC, needs gait, balance, LE strength and conditioning    PT Home Exercise Plan  Access Code: [email or web address], added parial lunges, sit to stands, step ups fwd, lat, retro, also added LTR, SKTC, pelvic tilts, bridges, hip flexor stretch, HSS    Consulted and Agree with Plan of Care  Patient       Patient will benefit from skilled therapeutic intervention in order to  improve the following deficits  and impairments:  Abnormal gait, Decreased activity tolerance, Decreased balance, Decreased endurance, Decreased strength, Difficulty walking  Visit Diagnosis: Unsteadiness on feet  Muscle weakness (generalized)  Hemiplegia and hemiparesis following nontraumatic intracerebral hemorrhage affecting left non-dominant side (HCC)  Chronic bilateral low back pain without sciatica     Problem List Patient Active Problem List   Diagnosis Date Noted  . Calculus of kidney 03/12/2018  . Bleeding internal hemorrhoids 10/09/2017  . Sleep apnea 09/22/2017  . History of adenomatous polyp of colon 09/18/2017  . Prostate cancer ([place]) 09/18/2017  . Essential hypertension 08/26/2017  . Hyperlipidemia 08/26/2017  . Cerebral venous thrombosis 07/13/2017  . Intracerebral hemorrhage 07/10/2017  . ICH (intracerebral hemorrhage) ([place]) 07/10/2017  . Unstable angina ([place]) 03/12/2013    Silvestre Mesi 04/06/2019, 2:06 PM  [facility] [address] [place], [place], [address] Phone: [phone]   Fax:  [phone]  Name: CANELO GUTMAN MRN: [id number] Date of Birth: 03-11-1946

## 2019-04-08 ENCOUNTER — Ambulatory Visit: Payer: Medicare Other | Admitting: Physical Therapy

## 2019-04-08 ENCOUNTER — Other Ambulatory Visit: Payer: Self-pay

## 2019-04-08 ENCOUNTER — Encounter: Payer: Self-pay | Admitting: Physical Therapy

## 2019-04-08 DIAGNOSIS — M6281 Muscle weakness (generalized): Secondary | ICD-10-CM | POA: Diagnosis not present

## 2019-04-08 DIAGNOSIS — G8929 Other chronic pain: Secondary | ICD-10-CM | POA: Diagnosis not present

## 2019-04-08 DIAGNOSIS — R29818 Other symptoms and signs involving the nervous system: Secondary | ICD-10-CM | POA: Diagnosis not present

## 2019-04-08 DIAGNOSIS — R2681 Unsteadiness on feet: Secondary | ICD-10-CM

## 2019-04-08 DIAGNOSIS — M545 Low back pain: Secondary | ICD-10-CM | POA: Diagnosis not present

## 2019-04-08 DIAGNOSIS — I69154 Hemiplegia and hemiparesis following nontraumatic intracerebral hemorrhage affecting left non-dominant side: Secondary | ICD-10-CM | POA: Diagnosis not present

## 2019-04-08 NOTE — Therapy (Signed)
[facility] [address] [place], [place], [address] Phone: [phone]   Fax:  [phone]  Physical Therapy Treatment  Patient Details  Name: Justin Adams MRN: [id number] Date of Birth: [birth year] Referring Provider (PT): [name], MD   Encounter Date: 04/08/2019  PT End of Session - 04/08/19 1006    Visit Number  8    Number of Visits  18    Date for PT Re-Evaluation  05/13/19   re-eval and recert performed [id number] to extend POC due to adding in low back   Authorization Type  MCR AARP    PT Start Time  0845    PT Stop Time  0930    PT Time Calculation (min)  45 min    Activity Tolerance  Patient tolerated treatment well    Behavior During Therapy  [facility] for tasks assessed/performed       Past Medical History:  Diagnosis Date  . Adenomatous polyp   . Bleeding internal hemorrhoids 10/2017  . Cerebral venous thrombosis   . Grade I diastolic dysfunction 0000000   noted on ECHO   . Hemorrhoids   . History of kidney stones   . Hypercholesteremia   . Hypertension   . Intracranial hemorrhage (HCC)    Right temporal lobe  . LVH (left ventricular hypertrophy) 07/11/2017   Mild, noted on ECHO   . Muscular degeneration   . Nephrolithiasis   . Obesity   . OSA (obstructive sleep apnea)   . Polycythemia   . Prostate cancer ([place]) 2003  . Shingles   . Stroke ([place]) 06/2017   No residual  . Unsteady gait   . Ureteral calculi 02/12/2008   Left    Past Surgical History:  Procedure Laterality Date  . APPENDECTOMY     childhood  . CHOLECYSTECTOMY    . COLONOSCOPY    . HEMORRHOID BANDING  2015  . HOLMIUM LASER APPLICATION Left [id number]   Procedure: HOLMIUM LASER APPLICATION;  Surgeon: Franchot Gallo, MD;  [facility];  Service: Urology;  Laterality: Left;  . IR ANGIO EXTERNAL CAROTID SEL EXT CAROTID UNI R MOD SED  07/10/2017  . IR ANGIO INTRA EXTRACRAN SEL INTERNAL CAROTID BILAT MOD SED   07/10/2017  . IR ANGIO VERTEBRAL SEL VERTEBRAL BILAT MOD SED  07/10/2017  . IR URETERAL STENT LEFT NEW ACCESS W/O SEP NEPHROSTOMY CATH  03/12/2018  . LEFT HEART CATHETERIZATION WITH CORONARY ANGIOGRAM N/A 03/15/2013   Procedure: LEFT HEART CATHETERIZATION WITH CORONARY ANGIOGRAM;  Surgeon: Minus Breeding, MD;  Location: [facility] CATH LAB;  Service: Cardiovascular;  Laterality: N/A;  . NEPHROLITHOTOMY Left 03/12/2018   Procedure: LEFT NEPHROLITHOTOMY PERCUTANEOUS, STENT PLACEMENT;  Surgeon: Franchot Gallo, MD;  [facility];  Service: Urology;  Laterality: Left;  . PROSTATECTOMY    . STONE EXTRACTION WITH BASKET      There were no vitals filed for this visit.  Subjective Assessment - 04/08/19 0928    Subjective  He feels pretty good today, no complaints upon arrival    Pertinent History  PMH: HTN, CVA 06/2017, prostate Ca 2003,    Patient Stated Goals  improve gait and conditioning    Pain Onset  More than a month ago         [facility] PT Assessment - 04/08/19 0001      Assessment   Medical Diagnosis  Unsteady Gait, LBP      6 minute walk test results    Aerobic Endurance Distance Walked  [facility] PT Treatment/Exercise - 04/08/19 0001      Ambulation/Gait   Gait Comments  6MWT mod I      Exercises   Other Exercises   lumbar stretches:  hamstring stretch and hip flexor stretch all 30 sec X 2 bilat, LTR X 10 reps 5 sec each,  DKTC with feet on [place] 10 sec X 10,bridges  5sec X 15. Leg strength at leg press 60 lbs 3X15, sit to stands no UE X 5 reps from low mat table, beginning deadlift from 8 inch step 10 lbs  x10 with cues and demo for technique and body mechanics. Balance SLS cone taps      Knee/Hip Exercises: Aerobic   Tread Mill  6 min 2.0 MPH warm up                  PT Long Term Goals - 04/08/19 1009      PT LONG TERM GOAL #1   Title  Pt will report performing HEP, walking program and transition to gym at end of PT    Time   6    Period  Weeks    Status  On-going      PT LONG TERM GOAL #2   Title  Pt will increaese overall leg strength to at least 4+ to 5- overall to improve function    Time  6    Period  Weeks    Status  On-going      PT LONG TERM GOAL #3   Title  Will improve BERG to at least 53 to show improved balance    Baseline  now 53    Time  6    Period  Weeks    Status  Achieved      PT LONG TERM GOAL #4   Title  Will improve FGA to at least 29 to lower falls risk    Baseline  27    Time  6    Period  Weeks    Status  On-going      PT LONG TERM GOAL #5   Title  Will improve 6 minute walk test to 1560 ft    Time  6    Period  Weeks    Status  On-going            Plan - 04/08/19 1007    Clinical Impression Statement  Continued to focus on lumbar stretching and leg/lumbar strength without complaints. He was able to progress deadlift from low bench (about 20 inches) to an 8 inch step and increase weight from 5 lbs to 10 lbs. 6MWT performed today to establish baseline endurance, he is below the norm on this for his age group.    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    Rehab Potential  Good    PT Frequency  2x / week    PT Duration  6 weeks    PT Treatment/Interventions  Aquatic Therapy;Cryotherapy;Electrical Stimulation;Moist Heat;Gait training;Stair training;Functional mobility training;Therapeutic activities;Therapeutic exercise;Balance training;Neuromuscular re-education;Manual techniques;Passive range of motion;Taping;Joint Manipulations    PT Next Visit Plan  added low back into POC, needs gait, balance, LE  strength and conditioning    PT Home Exercise Plan  Access Code:  [email or web address], added parial lunges, sit to stands, step ups fwd, lat, retro, also added LTR, SKTC, pelvic tilts, bridges, hip flexor stretch, HSS    Consulted and Agree with Plan of Care  Patient       Patient will benefit from skilled therapeutic intervention in order to improve the following deficits and impairments:  Abnormal gait, Decreased activity tolerance, Decreased balance, Decreased endurance, Decreased strength, Difficulty walking  Visit Diagnosis: Unsteadiness on feet  Muscle weakness (generalized)  Chronic bilateral low back pain without sciatica     Problem List Patient Active Problem List   Diagnosis Date Noted  . Calculus of kidney 03/12/2018  . Bleeding internal hemorrhoids 10/09/2017  . Sleep apnea 09/22/2017  . History of adenomatous polyp of colon 09/18/2017  . Prostate cancer ([place]) 09/18/2017  . Essential hypertension 08/26/2017  . Hyperlipidemia 08/26/2017  . Cerebral venous thrombosis 07/13/2017  . Intracerebral hemorrhage 07/10/2017  . ICH (intracerebral hemorrhage) ([place]) 07/10/2017  . Unstable angina ([place]) 03/12/2013    Debbe Odea, PT,DPT 04/08/2019, 10:11 AM  [facility] [address] [place], [place], [address] Phone: [phone]   Fax:  [phone]  Name: TAVITA RODELA MRN: [id number] Date of Birth: 12-26-1945

## 2019-04-12 DIAGNOSIS — H33312 Horseshoe tear of retina without detachment, left eye: Secondary | ICD-10-CM | POA: Diagnosis not present

## 2019-04-12 DIAGNOSIS — H43812 Vitreous degeneration, left eye: Secondary | ICD-10-CM | POA: Diagnosis not present

## 2019-04-12 DIAGNOSIS — H353221 Exudative age-related macular degeneration, left eye, with active choroidal neovascularization: Secondary | ICD-10-CM | POA: Diagnosis not present

## 2019-04-13 ENCOUNTER — Other Ambulatory Visit: Payer: Self-pay

## 2019-04-13 ENCOUNTER — Ambulatory Visit: Payer: Medicare Other | Attending: Geriatric Medicine | Admitting: Physical Therapy

## 2019-04-13 DIAGNOSIS — R2681 Unsteadiness on feet: Secondary | ICD-10-CM | POA: Diagnosis not present

## 2019-04-13 DIAGNOSIS — G8929 Other chronic pain: Secondary | ICD-10-CM | POA: Diagnosis not present

## 2019-04-13 DIAGNOSIS — M6281 Muscle weakness (generalized): Secondary | ICD-10-CM | POA: Diagnosis not present

## 2019-04-13 DIAGNOSIS — M545 Low back pain: Secondary | ICD-10-CM | POA: Diagnosis not present

## 2019-04-13 NOTE — Therapy (Signed)
[facility] [address] [place], [place], [address] Phone: [phone]   Fax:  [phone]  Physical Therapy Treatment  Patient Details  Name: Justin Adams MRN: [id number] Date of Birth: [birth year] Referring Provider (PT): [name], MD   Encounter Date: 04/13/2019  PT End of Session - 04/13/19 0824    Visit Number  9    Number of Visits  18    Date for PT Re-Evaluation  05/13/19   re-eval and recert performed [id number] to extend POC due to adding in low back   Authorization Type  MCR AARP    PT Start Time  0800    PT Stop Time  0845    PT Time Calculation (min)  45 min    Activity Tolerance  Patient tolerated treatment well    Behavior During Therapy  [facility] for tasks assessed/performed       Past Medical History:  Diagnosis Date  . Adenomatous polyp   . Bleeding internal hemorrhoids 10/2017  . Cerebral venous thrombosis   . Grade I diastolic dysfunction 0000000   noted on ECHO   . Hemorrhoids   . History of kidney stones   . Hypercholesteremia   . Hypertension   . Intracranial hemorrhage (HCC)    Right temporal lobe  . LVH (left ventricular hypertrophy) 07/11/2017   Mild, noted on ECHO   . Muscular degeneration   . Nephrolithiasis   . Obesity   . OSA (obstructive sleep apnea)   . Polycythemia   . Prostate cancer ([place]) 2003  . Shingles   . Stroke ([place]) 06/2017   No residual  . Unsteady gait   . Ureteral calculi 02/12/2008   Left    Past Surgical History:  Procedure Laterality Date  . APPENDECTOMY     childhood  . CHOLECYSTECTOMY    . COLONOSCOPY    . HEMORRHOID BANDING  2015  . HOLMIUM LASER APPLICATION Left [id number]   Procedure: HOLMIUM LASER APPLICATION;  Surgeon: Franchot Gallo, MD;  [facility];  Service: Urology;  Laterality: Left;  . IR ANGIO EXTERNAL CAROTID SEL EXT CAROTID UNI R MOD SED  07/10/2017  . IR ANGIO INTRA EXTRACRAN SEL INTERNAL CAROTID BILAT MOD SED   07/10/2017  . IR ANGIO VERTEBRAL SEL VERTEBRAL BILAT MOD SED  07/10/2017  . IR URETERAL STENT LEFT NEW ACCESS W/O SEP NEPHROSTOMY CATH  03/12/2018  . LEFT HEART CATHETERIZATION WITH CORONARY ANGIOGRAM N/A 03/15/2013   Procedure: LEFT HEART CATHETERIZATION WITH CORONARY ANGIOGRAM;  Surgeon: Minus Breeding, MD;  Location: [facility] CATH LAB;  Service: Cardiovascular;  Laterality: N/A;  . NEPHROLITHOTOMY Left 03/12/2018   Procedure: LEFT NEPHROLITHOTOMY PERCUTANEOUS, STENT PLACEMENT;  Surgeon: Franchot Gallo, MD;  [facility];  Service: Urology;  Laterality: Left;  . PROSTATECTOMY    . STONE EXTRACTION WITH BASKET      There were no vitals filed for this visit.  Subjective Assessment - 04/13/19 0823    Subjective  Back was in more pain after last session but feels better today, no pain at this moment    Currently in Pain?  No/denies                       [facility] Adult PT Treatment/Exercise - 04/13/19 0001      Neuro Re-ed    Neuro Re-ed Details   in bars for tandem walk fwd and retro X 3 ea, stepping over hurldes reciprocaly X5, side stepping on foam beam  X3      Exercises   Other Exercises   lumbar stretching: SKTC 30 sec X 2 ea, LTR 10 reps X 5 sec each standing hip flexion stretch 20 sec  X2 ea, lumbar strength: bridges 5 sec X15, rows and ext and H abd with green X 20 ea, bent over row 5 lb X 15 , dead lift from 8 inch step 5 lbs X 10. Leg strength: sit to stands no UE support from raised low mat table X10, step ups 6 inch X 15 bilat fwd      Knee/Hip Exercises: Aerobic   Tread Mill  6 min 2.6 MPH warm up                  PT Long Term Goals - 04/08/19 1009      PT LONG TERM GOAL #1   Title  Pt will report performing HEP, walking program and transition to gym at end of PT    Time  6    Period  Weeks    Status  On-going      PT LONG TERM GOAL #2   Title  Pt will increaese overall leg strength to at least 4+ to 5- overall to improve function    Time  6     Period  Weeks    Status  On-going      PT LONG TERM GOAL #3   Title  Will improve BERG to at least 53 to show improved balance    Baseline  now 53    Time  6    Period  Weeks    Status  Achieved      PT LONG TERM GOAL #4   Title  Will improve FGA to at least 29 to lower falls risk    Baseline  27    Time  6    Period  Weeks    Status  On-going      PT LONG TERM GOAL #5   Title  Will improve 6 minute walk test to 1560 ft    Time  6    Period  Weeks    Status  On-going            Plan - 04/13/19 [id number]    Clinical Impression Statement  We now have MD presciption to add PT for his back, this has already been evaluated and will continue to work more on lumbar strength and stretching. Progressed leg strength and balance training today with good tolerance. He will need progress note next visit.    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    Rehab Potential  Good    PT Frequency  2x / week    PT Duration  6 weeks    PT Treatment/Interventions  Aquatic Therapy;Cryotherapy;Electrical Stimulation;Moist Heat;Gait training;Stair training;Functional mobility training;Therapeutic activities;Therapeutic exercise;Balance training;Neuromuscular re-education;Manual techniques;Passive range of motion;Taping;Joint Manipulations    PT Next Visit Plan  progress note!!!!!!!added low back into POC, needs gait, balance, LE strength and conditioning    PT Home Exercise Plan  Access Code: [email or web address], added parial lunges, sit to stands, step ups fwd, lat, retro, also added LTR, SKTC, pelvic tilts, bridges, hip flexor stretch, HSS    Consulted and Agree with Plan of Care  Patient       Patient will  benefit from skilled therapeutic intervention in order to improve the following deficits and impairments:  Abnormal gait, Decreased activity tolerance, Decreased balance, Decreased endurance, Decreased strength, Difficulty walking  Visit Diagnosis: Unsteadiness on feet  Muscle weakness (generalized)  Chronic bilateral low back pain without sciatica     Problem List Patient Active Problem List   Diagnosis Date Noted  . Calculus of kidney 03/12/2018  . Bleeding internal hemorrhoids 10/09/2017  . Sleep apnea 09/22/2017  . History of adenomatous polyp of colon 09/18/2017  . Prostate cancer ([place]) 09/18/2017  . Essential hypertension 08/26/2017  . Hyperlipidemia 08/26/2017  . Cerebral venous thrombosis 07/13/2017  . Intracerebral hemorrhage 07/10/2017  . ICH (intracerebral hemorrhage) ([place]) 07/10/2017  . Unstable angina ([place]) 03/12/2013    Debbe Odea ,PT,DPT 04/13/2019, 8:44 AM  [facility] [address] [place], [place], [address] Phone: [phone]   Fax:  [phone]  Name: BRIAR STPIERRE MRN: [id number] Date of Birth: 01-Oct-1945

## 2019-04-15 ENCOUNTER — Ambulatory Visit: Payer: Medicare Other | Admitting: Physical Therapy

## 2019-04-26 ENCOUNTER — Telehealth: Payer: Self-pay | Admitting: Neurology

## 2019-04-26 NOTE — Telephone Encounter (Signed)
Noted  

## 2019-04-26 NOTE — Telephone Encounter (Signed)
Patient called regarding an episode he had over the weekend. He said he would like to speak with some about that. Please Call. Thank you

## 2019-04-26 NOTE — Telephone Encounter (Signed)
Called spoke with patient he states that he was at a restaurant watching football game. He started getting dizziness where the room was spending. This happened one time it lasted about 30-45 seconds. He had just eating blood sugars was fine. He wanted to give provider update   Current: Eliquis, amlodipine, atorvastatin

## 2019-05-03 ENCOUNTER — Ambulatory Visit (INDEPENDENT_AMBULATORY_CARE_PROVIDER_SITE_OTHER): Payer: Medicare Other | Admitting: Physical Therapy

## 2019-05-03 ENCOUNTER — Other Ambulatory Visit: Payer: Self-pay

## 2019-05-03 DIAGNOSIS — I69154 Hemiplegia and hemiparesis following nontraumatic intracerebral hemorrhage affecting left non-dominant side: Secondary | ICD-10-CM

## 2019-05-03 DIAGNOSIS — M545 Low back pain, unspecified: Secondary | ICD-10-CM

## 2019-05-03 DIAGNOSIS — G8929 Other chronic pain: Secondary | ICD-10-CM | POA: Diagnosis not present

## 2019-05-03 DIAGNOSIS — R2681 Unsteadiness on feet: Secondary | ICD-10-CM

## 2019-05-03 DIAGNOSIS — M6281 Muscle weakness (generalized): Secondary | ICD-10-CM | POA: Diagnosis not present

## 2019-05-03 NOTE — Therapy (Signed)
[facility] Physical Therapy [address] [place], [place], [address] Phone: [phone]   Fax:  [phone]  Physical Therapy Treatment  Progress Note reporting period date 02/18/19 to 05/03/19 See below for objective and subjective measurements relating to patients progress with PT.   Patient Details  Name: Justin Adams MRN: [id number] Date of Birth: 10-20-1945 Referring Provider (PT): [name], MD   Encounter Date: 05/03/2019  PT End of Session - 05/03/19 1541    Visit Number  10    Number of Visits  18    Date for PT Re-Evaluation  05/13/19   re-eval and recert performed 123XX123 to extend POC due to adding in low back   Authorization Type  MCR [employer]    PT Start Time  1445    PT Stop Time  1530    PT Time Calculation (min)  45 min    Activity Tolerance  Patient tolerated treatment well    Behavior During Therapy  [facility] for tasks assessed/performed       Past Medical History:  Diagnosis Date  . Adenomatous polyp   . Bleeding internal hemorrhoids 10/2017  . Cerebral venous thrombosis   . Grade I diastolic dysfunction 0000000   noted on ECHO   . Hemorrhoids   . History of kidney stones   . Hypercholesteremia   . Hypertension   . Intracranial hemorrhage (HCC)    Right temporal lobe  . LVH (left ventricular hypertrophy) 07/11/2017   Mild, noted on ECHO   . Muscular degeneration   . Nephrolithiasis   . Obesity   . OSA (obstructive sleep apnea)   . Polycythemia   . Prostate cancer ([place]) 2003  . Shingles   . Stroke ([place]) 06/2017   No residual  . Unsteady gait   . Ureteral calculi 02/12/2008   Left    Past Surgical History:  Procedure Laterality Date  . APPENDECTOMY     childhood  . CHOLECYSTECTOMY    . COLONOSCOPY    . HEMORRHOID BANDING  2015  . HOLMIUM LASER APPLICATION Left [id number]   Procedure: HOLMIUM LASER APPLICATION;  Surgeon: Franchot Gallo, MD;  Location: [facility];  Service: Urology;  Laterality: Left;  .  IR ANGIO EXTERNAL CAROTID SEL EXT CAROTID UNI R MOD SED  07/10/2017  . IR ANGIO INTRA EXTRACRAN SEL INTERNAL CAROTID BILAT MOD SED  07/10/2017  . IR ANGIO VERTEBRAL SEL VERTEBRAL BILAT MOD SED  07/10/2017  . IR URETERAL STENT LEFT NEW ACCESS W/O SEP NEPHROSTOMY CATH  03/12/2018  . LEFT HEART CATHETERIZATION WITH CORONARY ANGIOGRAM N/A 03/15/2013   Procedure: LEFT HEART CATHETERIZATION WITH CORONARY ANGIOGRAM;  Surgeon: Minus Breeding, MD;  Location: [facility] CATH LAB;  Service: Cardiovascular;  Laterality: N/A;  . NEPHROLITHOTOMY Left 03/12/2018   Procedure: LEFT NEPHROLITHOTOMY PERCUTANEOUS, STENT PLACEMENT;  Surgeon: Franchot Gallo, MD;  Location: [facility];  Service: Urology;  Laterality: Left;  . PROSTATECTOMY    . STONE EXTRACTION WITH BASKET      There were no vitals filed for this visit.  Subjective Assessment - 05/03/19 1540    Subjective  My back is not hurting me today but I also have not done anything.    Pertinent History  PMH: HTN, CVA 06/2017, prostate Ca 2003,    Patient Stated Goals  improve gait and conditioning    Currently in Pain?  No/denies    Pain Onset  More than a month ago         [facility] PT Assessment - 05/03/19  0001      Functional Tests   Functional tests  Sit to Stand;Single leg stance      Single Leg Stance   Comments  7 sec avg      Sit to Stand   Comments  still needs UE support to perform sit to stand, or can perform without UE support if you put airex pad in his seat      AROM   Lumbar Flexion  75%    Lumbar Extension  75%    Lumbar - Right Side Bend  75%    Lumbar - Left Side Bend  75%    Lumbar - Right Rotation  75%    Lumbar - Left Rotation  75%      Strength   Overall Strength Comments  knee strength overall 5/5, hip strength overall 4+5 MMT grossly tested in sitting                   [facility] Adult PT Treatment/Exercise - 05/03/19 0001      Neuro Re-ed    Neuro Re-ed Details   Balance: tandem walk fwd, tandem walk retro, SLS,  grapevine      Exercises   Other Exercises   lumbar stretching: DKTC 30 sec X 2 ea, LTR 5 X 10 sec each,lumbar strength: quadriped leg extensions X 10 bilat alt, rows and ext and H abd with blue X 20 ea,  Leg strength: Leg press on shuttle 75 lbs 3X10, sit to stands no UE support  with airex pad in chair 2X5      Knee/Hip Exercises: Aerobic   Tread Mill  8 min 2.0 MPH (.25 miles)      Manual Therapy   Manual therapy comments  long axis distranction, SL lumbar rotational mobs                  PT Long Term Goals - 05/03/19 1513      PT LONG TERM GOAL #1   Title  Pt will report performing HEP, walking program and transition to gym at end of PT    Time  6    Period  Weeks    Status  On-going      PT LONG TERM GOAL #2   Title  Pt will increaese overall leg strength to at least 4+ to 5- overall to improve function    Time  6    Period  Weeks    Status  On-going      PT LONG TERM GOAL #3   Title  Will improve BERG to at least 53 to show improved balance    Baseline  now 53    Time  6    Period  Weeks    Status  Achieved      PT LONG TERM GOAL #4   Title  Will improve FGA to at least 29 to lower falls risk    Baseline  27    Time  6    Period  Weeks    Status  On-going      PT LONG TERM GOAL #5   Title  Will improve 6 minute walk test to 1560 ft    Time  6    Period  Weeks    Status  On-going            Plan - 05/03/19 1542    Clinical Impression Statement  10th visit progress note today. He returns to PT after 2 week  layoff as he was quanteened after possible covid exposure. Overall he is progressing with balance, leg strength, lumbar strength, and lumbar ROM however still has deficits in these areas. He will continue to benefit from skilled PT    Personal Factors and Comorbidities  Comorbidity 1;Comorbidity 2    Comorbidities  PMH: HTN, CVA 06/2017, prostate Ca 2003,    Examination-Activity Limitations  Bend;Squat;Stairs;Stand;Lift;Locomotion Level     Examination-Participation Restrictions  Community Activity;Shop;Yard Work    [employer] officer  Evolving/Moderate complexity    Rehab Potential  Good    PT Frequency  2x / week    PT Duration  6 weeks    PT Treatment/Interventions  Aquatic Therapy;Cryotherapy;Electrical Stimulation;Moist Heat;Gait training;Stair training;Functional mobility training;Therapeutic activities;Therapeutic exercise;Balance training;Neuromuscular re-education;Manual techniques;Passive range of motion;Taping;Joint Manipulations    PT Next Visit Plan  added low back into POC, needs gait, balance, LE strength and conditioning    PT Home Exercise Plan  Access Code: [email or web address], added parial lunges, sit to stands, step ups fwd, lat, retro, also added LTR, SKTC, pelvic tilts, bridges, hip flexor stretch, HSS    Consulted and Agree with Plan of Care  Patient       Patient will benefit from skilled therapeutic intervention in order to improve the following deficits and impairments:  Abnormal gait, Decreased activity tolerance, Decreased balance, Decreased endurance, Decreased strength, Difficulty walking  Visit Diagnosis: Unsteadiness on feet  Muscle weakness (generalized)  Chronic bilateral low back pain without sciatica  Hemiplegia and hemiparesis following nontraumatic intracerebral hemorrhage affecting left non-dominant side [facility])     Problem List Patient Active Problem List   Diagnosis Date Noted  . Calculus of kidney 03/12/2018  . Bleeding internal hemorrhoids 10/09/2017  . Sleep apnea 09/22/2017  . History of adenomatous polyp of colon 09/18/2017  . Prostate cancer ([place]) 09/18/2017  . Essential hypertension 08/26/2017  . Hyperlipidemia 08/26/2017  . Cerebral venous thrombosis 07/13/2017  . Intracerebral hemorrhage 07/10/2017  . ICH (intracerebral hemorrhage) ([place]) 07/10/2017  . Unstable angina [facility]) 03/12/2013    Silvestre Mesi 05/03/2019, 3:55 PM  [facility]  Physical Therapy [address] [place], [place], [address] Phone: [phone]   Fax:  [phone]  Name: Justin Adams MRN: [id number] Date of Birth: [birth year]

## 2019-05-05 ENCOUNTER — Ambulatory Visit (INDEPENDENT_AMBULATORY_CARE_PROVIDER_SITE_OTHER): Payer: Medicare Other | Admitting: Physical Therapy

## 2019-05-05 ENCOUNTER — Other Ambulatory Visit: Payer: Self-pay

## 2019-05-05 DIAGNOSIS — I69154 Hemiplegia and hemiparesis following nontraumatic intracerebral hemorrhage affecting left non-dominant side: Secondary | ICD-10-CM | POA: Diagnosis not present

## 2019-05-05 DIAGNOSIS — G8929 Other chronic pain: Secondary | ICD-10-CM | POA: Diagnosis not present

## 2019-05-05 DIAGNOSIS — R2681 Unsteadiness on feet: Secondary | ICD-10-CM

## 2019-05-05 DIAGNOSIS — M6281 Muscle weakness (generalized): Secondary | ICD-10-CM

## 2019-05-05 DIAGNOSIS — M545 Low back pain: Secondary | ICD-10-CM

## 2019-05-05 NOTE — Therapy (Signed)
[facility] Physical Therapy [address] [place], [place], [address] Phone: [phone]   Fax:  [phone]  Physical Therapy Treatment  Patient Details  Name: Justin Adams MRN: [id number] Date of Birth: [birth year] Referring Provider (PT): [name], MD   Encounter Date: 05/05/2019  PT End of Session - 05/05/19 0939    Visit Number  11    Number of Visits  18    Date for PT Re-Evaluation  05/13/19   re-eval and recert performed [id number] to extend POC due to adding in low back   Authorization Type  MCR AARP    PT Start Time  0805    PT Stop Time  0849    PT Time Calculation (min)  44 min    Activity Tolerance  Patient tolerated treatment well    Behavior During Therapy  [facility] for tasks assessed/performed       Past Medical History:  Diagnosis Date  . Adenomatous polyp   . Bleeding internal hemorrhoids 10/2017  . Cerebral venous thrombosis   . Grade I diastolic dysfunction 0000000   noted on ECHO   . Hemorrhoids   . History of kidney stones   . Hypercholesteremia   . Hypertension   . Intracranial hemorrhage (HCC)    Right temporal lobe  . LVH (left ventricular hypertrophy) 07/11/2017   Mild, noted on ECHO   . Muscular degeneration   . Nephrolithiasis   . Obesity   . OSA (obstructive sleep apnea)   . Polycythemia   . Prostate cancer ([place]) 2003  . Shingles   . Stroke ([place]) 06/2017   No residual  . Unsteady gait   . Ureteral calculi 02/12/2008   Left    Past Surgical History:  Procedure Laterality Date  . APPENDECTOMY     childhood  . CHOLECYSTECTOMY    . COLONOSCOPY    . HEMORRHOID BANDING  2015  . HOLMIUM LASER APPLICATION Left [id number]   Procedure: HOLMIUM LASER APPLICATION;  Surgeon: Franchot Gallo, MD;  Location: [facility];  Service: Urology;  Laterality: Left;  . IR ANGIO EXTERNAL CAROTID SEL EXT CAROTID UNI R MOD SED  07/10/2017  . IR ANGIO INTRA EXTRACRAN SEL INTERNAL CAROTID BILAT MOD SED  07/10/2017  . IR ANGIO  VERTEBRAL SEL VERTEBRAL BILAT MOD SED  07/10/2017  . IR URETERAL STENT LEFT NEW ACCESS W/O SEP NEPHROSTOMY CATH  03/12/2018  . LEFT HEART CATHETERIZATION WITH CORONARY ANGIOGRAM N/A 03/15/2013   Procedure: LEFT HEART CATHETERIZATION WITH CORONARY ANGIOGRAM;  Surgeon: Minus Breeding, MD;  Location: [facility] CATH LAB;  Service: Cardiovascular;  Laterality: N/A;  . NEPHROLITHOTOMY Left 03/12/2018   Procedure: LEFT NEPHROLITHOTOMY PERCUTANEOUS, STENT PLACEMENT;  Surgeon: Franchot Gallo, MD;  Location: [facility];  Service: Urology;  Laterality: Left;  . PROSTATECTOMY    . STONE EXTRACTION WITH BASKET      There were no vitals filed for this visit.  Subjective Assessment - 05/05/19 0902    Subjective  woke up some pain in his back but after walking around eased off some    Pertinent History  PMH: HTN, CVA 06/2017, prostate Ca 2003,    Patient Stated Goals  improve gait and conditioning    Pain Onset  More than a month ago                       [facility] Adult PT Treatment/Exercise - 05/05/19 0001      Neuro Re-ed    Neuro Re-ed Details  Balance: tandem walk fwd, tandem walk retro, SLS, grapevine      Exercises   Other Exercises   lumbar stretching: SKTC 30 sec X 2 ea, LTR 5 X 10 sec each, supine H.S. stretch with strap 30 sec X 2 ea,lumbar strength: quadriped leg extensions X 10 bilat alt, bridges with feet on Pball but needs mod A to stabilize ball 10 reps then 5,  rows and ext and H abd with blue X 20 ea all sitting on Pball,  Leg strength: Leg press on shuttle 87 lbs 3X10, sit to stands no UE support  with airex pad in chair 2X5      Knee/Hip Exercises: Aerobic   Tread Mill  10 min 2.0 to 2.5 MPH (.39 miles)      Manual Therapy   Manual therapy comments  long axis distranction, SL lumbar rotational mobs                  PT Long Term Goals - 05/03/19 1513      PT LONG TERM GOAL #1   Title  Pt will report performing HEP, walking program and transition to gym at end of  PT    Time  6    Period  Weeks    Status  On-going      PT LONG TERM GOAL #2   Title  Pt will increaese overall leg strength to at least 4+ to 5- overall to improve function    Time  6    Period  Weeks    Status  On-going      PT LONG TERM GOAL #3   Title  Will improve BERG to at least 53 to show improved balance    Baseline  now 53    Time  6    Period  Weeks    Status  Achieved      PT LONG TERM GOAL #4   Title  Will improve FGA to at least 29 to lower falls risk    Baseline  27    Time  6    Period  Weeks    Status  On-going      PT LONG TERM GOAL #5   Title  Will improve 6 minute walk test to 1560 ft    Time  6    Period  Weeks    Status  On-going            Plan - 05/05/19 [id number]    Clinical Impression Statement  Continued with strength, ROM, and manual therapy. He was able to progress core today with good tolerance. Provided general nutrition recommendations as he feels he needs to lose weight. Continue POC    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    Rehab Potential  Good    PT Frequency  2x / week    PT Duration  6 weeks    PT Treatment/Interventions  Aquatic Therapy;Cryotherapy;Electrical Stimulation;Moist Heat;Gait training;Stair training;Functional mobility training;Therapeutic activities;Therapeutic exercise;Balance training;Neuromuscular re-education;Manual techniques;Passive range of motion;Taping;Joint Manipulations    PT Next Visit Plan  added low back into POC, needs gait, balance, LE strength and conditioning    PT Home Exercise Plan  Access Code: [email or web address], added parial lunges, sit to stands, step ups fwd, lat, retro,  also added LTR, SKTC, pelvic  tilts, bridges, hip flexor stretch, HSS    Consulted and Agree with Plan of Care  Patient       Patient will benefit from skilled therapeutic intervention in order to improve the following deficits and impairments:  Abnormal gait, Decreased activity tolerance, Decreased balance, Decreased endurance, Decreased strength, Difficulty walking  Visit Diagnosis: Unsteadiness on feet  Muscle weakness (generalized)  Chronic bilateral low back pain without sciatica  Hemiplegia and hemiparesis following nontraumatic intracerebral hemorrhage affecting left non-dominant side [facility])     Problem List Patient Active Problem List   Diagnosis Date Noted  . Calculus of kidney 03/12/2018  . Bleeding internal hemorrhoids 10/09/2017  . Sleep apnea 09/22/2017  . History of adenomatous polyp of colon 09/18/2017  . Prostate cancer ([place]) 09/18/2017  . Essential hypertension 08/26/2017  . Hyperlipidemia 08/26/2017  . Cerebral venous thrombosis 07/13/2017  . Intracerebral hemorrhage 07/10/2017  . ICH (intracerebral hemorrhage) ([place]) 07/10/2017  . Unstable angina ([place]) 03/12/2013    Debbe Odea ,PT,DPT 05/05/2019, 9:41 AM  [facility] Physical Therapy [address] [place], [place], [address] Phone: [phone]   Fax:  [phone]  Name: YOHANES KOBS MRN: [id number] Date of Birth: [birth year]

## 2019-05-12 ENCOUNTER — Ambulatory Visit (INDEPENDENT_AMBULATORY_CARE_PROVIDER_SITE_OTHER): Payer: Medicare Other | Admitting: Physical Therapy

## 2019-05-12 ENCOUNTER — Other Ambulatory Visit: Payer: Self-pay

## 2019-05-12 DIAGNOSIS — I69154 Hemiplegia and hemiparesis following nontraumatic intracerebral hemorrhage affecting left non-dominant side: Secondary | ICD-10-CM | POA: Diagnosis not present

## 2019-05-12 DIAGNOSIS — G8929 Other chronic pain: Secondary | ICD-10-CM

## 2019-05-12 DIAGNOSIS — R2681 Unsteadiness on feet: Secondary | ICD-10-CM | POA: Diagnosis not present

## 2019-05-12 DIAGNOSIS — R29818 Other symptoms and signs involving the nervous system: Secondary | ICD-10-CM | POA: Diagnosis not present

## 2019-05-12 DIAGNOSIS — M545 Low back pain: Secondary | ICD-10-CM

## 2019-05-12 DIAGNOSIS — M6281 Muscle weakness (generalized): Secondary | ICD-10-CM

## 2019-05-12 NOTE — Therapy (Signed)
[facility] Physical Therapy [address] [place], [place], [address] Phone: [phone]   Fax:  [phone]  Physical Therapy Treatment/Recertification  Patient Details  Name: Justin Adams MRN: [id number] Date of Birth: 1946/01/29 Referring Provider (PT): [name], MD   Encounter Date: 05/12/2019  PT End of Session - 05/12/19 0945    Visit Number  12    Number of Visits  24    Date for PT Re-Evaluation  74/16/38   recert performed on 45/3 to extend POC   Authorization Type  MCR AARP    PT Start Time  0849    PT Stop Time  0933    PT Time Calculation (min)  44 min    Activity Tolerance  Patient tolerated treatment well    Behavior During Therapy  [facility] for tasks assessed/performed       Past Medical History:  Diagnosis Date  . Adenomatous polyp   . Bleeding internal hemorrhoids 10/2017  . Cerebral venous thrombosis   . Grade I diastolic dysfunction 64/68/0321   noted on ECHO   . Hemorrhoids   . History of kidney stones   . Hypercholesteremia   . Hypertension   . Intracranial hemorrhage (HCC)    Right temporal lobe  . LVH (left ventricular hypertrophy) 07/11/2017   Mild, noted on ECHO   . Muscular degeneration   . Nephrolithiasis   . Obesity   . OSA (obstructive sleep apnea)   . Polycythemia   . Prostate cancer ([place]) 2003  . Shingles   . Stroke ([place]) 06/2017   No residual  . Unsteady gait   . Ureteral calculi 02/12/2008   Left    Past Surgical History:  Procedure Laterality Date  . APPENDECTOMY     childhood  . CHOLECYSTECTOMY    . COLONOSCOPY    . HEMORRHOID BANDING  2015  . HOLMIUM LASER APPLICATION Left 22/09/8248   Procedure: HOLMIUM LASER APPLICATION;  Surgeon: Franchot Gallo, MD;  Location: [facility];  Service: Urology;  Laterality: Left;  . IR ANGIO EXTERNAL CAROTID SEL EXT CAROTID UNI R MOD SED  07/10/2017  . IR ANGIO INTRA EXTRACRAN SEL INTERNAL CAROTID BILAT MOD SED  07/10/2017  . IR ANGIO VERTEBRAL SEL VERTEBRAL  BILAT MOD SED  07/10/2017  . IR URETERAL STENT LEFT NEW ACCESS W/O SEP NEPHROSTOMY CATH  03/12/2018  . LEFT HEART CATHETERIZATION WITH CORONARY ANGIOGRAM N/A 03/15/2013   Procedure: LEFT HEART CATHETERIZATION WITH CORONARY ANGIOGRAM;  Surgeon: Minus Breeding, MD;  Location: [facility] CATH LAB;  Service: Cardiovascular;  Laterality: N/A;  . NEPHROLITHOTOMY Left 03/12/2018   Procedure: LEFT NEPHROLITHOTOMY PERCUTANEOUS, STENT PLACEMENT;  Surgeon: Franchot Gallo, MD;  Location: [facility];  Service: Urology;  Laterality: Left;  . PROSTATECTOMY    . STONE EXTRACTION WITH BASKET      There were no vitals filed for this visit.  Subjective Assessment - 05/12/19 0945    Subjective  back is a litte painful and stiff but not bad. I feel like I need to keep coming to PT         [facility] PT Assessment - 05/12/19 0001      Assessment   Medical Diagnosis  Unsteady Gait, LBP    Referring Provider (PT)  Stoneking, Hal, MD      Single Leg Stance   Comments  10 sec avg      Sit to Stand   Comments  still needs UE support to perform sit to stand, or can perform without UE support  if you put airex pad in his seat      AROM   Overall AROM Comments  WFL lumbar ROM    Lumbar Flexion  75%    Lumbar Extension  75%    Lumbar - Right Side Bend  75%    Lumbar - Left Side Bend  75%    Lumbar - Right Rotation  75%    Lumbar - Left Rotation  75%      Strength   Overall Strength Comments  knee strength overall 5/5, hip strength overall 4+5 MMT grossly tested in sitting      Berg Balance Test   Sit to Stand  Able to stand  independently using hands    Standing Unsupported  Able to stand safely 2 minutes    Sitting with Back Unsupported but Feet Supported on Floor or Stool  Able to sit safely and securely 2 minutes    Stand to Sit  Sits safely with minimal use of hands    Transfers  Able to transfer safely, minor use of hands    Standing Unsupported with Eyes Closed  Able to stand 10 seconds safely    Standing  Unsupported with Feet Together  Able to place feet together independently and stand 1 minute safely    From Standing, Reach Forward with Outstretched Arm  Can reach confidently >25 cm (10")    From Standing Position, Pick up Object from Floor  Able to pick up shoe safely and easily    From Standing Position, Turn to Look Behind Over each Shoulder  Looks behind from both sides and weight shifts well    Turn 360 Degrees  Able to turn 360 degrees safely in 4 seconds or less    Standing Unsupported, Alternately Place Feet on Step/Stool  Able to stand independently and safely and complete 8 steps in 20 seconds    Standing Unsupported, One Foot in Front  Able to plae foot ahead of the other independently and hold 30 seconds    Standing on One Leg  Able to lift leg independently and hold > 10 seconds    Total Score  54                   [facility] Adult PT Treatment/Exercise - 05/12/19 0001      Exercises   Exercises  Lumbar      Lumbar Exercises: Stretches   Single Knee to Chest Stretch  Right;Left;2 reps;30 seconds    Lower Trunk Rotation  5 reps;10 seconds      Lumbar Exercises: Aerobic   Recumbent Bike  10 min L1-3      Lumbar Exercises: Standing   Other Standing Lumbar Exercises  deadlift with L4 blue band X 15 reps cues and demo for body mechanics and technique.       Lumbar Exercises: Seated   Sit to Stand Limitations  UE push up from knees with airex pad in chair 3X5      Lumbar Exercises: Supine   Bridge Limitations  bridge with alt leg ext X 10 reps      Knee/Hip Exercises: Machines for Strengthening   Other Machine  shuttle leg press 100 lbs 3X10      Knee/Hip Exercises: Standing   Functional Squat Limitations  squats on wall with ball X 15 reps                  PT Long Term Goals - 05/12/19 3428  PT LONG TERM GOAL #1   Title  Pt will report performing HEP, walking program and transition to gym at end of PT    Baseline  compliant with old HEP but  progressed HEP today due to progress    Time  6    Period  Weeks    Status  On-going      PT LONG TERM GOAL #2   Title  Pt will increaese overall leg strength to at least 4+ to 5- overall to improve function    Baseline  Met 05/12/19    Time  6    Period  Weeks    Status  Achieved      PT LONG TERM GOAL #3   Title  Will improve BERG to at least 53 to show improved balance    Baseline  now 54 on 12/2    Time  6    Period  Weeks    Status  Achieved      PT LONG TERM GOAL #4   Title  Will improve FGA to at least 29 to lower falls risk    Baseline  27    Time  6    Period  Weeks    Status  On-going      PT LONG TERM GOAL #5   Title  Will improve 6 minute walk test to 1560 ft    Baseline  will test  next session    Time  6    Period  Weeks    Status  On-going            Plan - 05/12/19 0947    Clinical Impression Statement  Recert performed today as he is at his end of POC. He did miss 2 weeks of PT due to quarantine from possible covid exposure. He has overall met 3/5 LTG and was able to show improved leg strength, improved overall lumbar ROM, and improved balance. He does still have deficits in these areas and will continue to benefit from skilled PT to address these functional deficits. POC written for 8 more weeks because he will miss two weeks due to vacations.    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    Rehab Potential  Good    PT Frequency  2x / week    PT Duration  6 weeks    PT Treatment/Interventions  Aquatic Therapy;Cryotherapy;Electrical Stimulation;Moist Heat;Gait training;Stair training;Functional mobility training;Therapeutic activities;Therapeutic  exercise;Balance training;Neuromuscular re-education;Manual techniques;Passive range of motion;Taping;Joint Manipulations    PT Next Visit Plan  added low back into POC, needs gait, balance, LE strength and conditioning    PT Home Exercise Plan  Access Code: [email or web address], added parial lunges, sit to stands, step ups fwd, lat, retro, also added LTR, SKTC, pelvic tilts, bridges, hip flexor stretch, HSS    Consulted and Agree with Plan of Care  Patient       Patient will benefit from skilled therapeutic intervention in order to improve the following deficits and impairments:  Abnormal gait, Decreased activity tolerance, Decreased balance, Decreased endurance, Decreased strength, Difficulty walking  Visit Diagnosis: Unsteadiness on feet  Muscle weakness (generalized)  Chronic bilateral low back pain without sciatica  Hemiplegia and hemiparesis following nontraumatic intracerebral hemorrhage affecting left non-dominant side (HCC)  Other symptoms and signs involving the nervous system     Problem List Patient Active Problem List   Diagnosis Date Noted  . Calculus of kidney 03/12/2018  . Bleeding internal hemorrhoids 10/09/2017  . Sleep apnea 09/22/2017  . History of adenomatous polyp of colon 09/18/2017  . Prostate cancer ([place]) 09/18/2017  . Essential hypertension 08/26/2017  . Hyperlipidemia 08/26/2017  . Cerebral venous thrombosis 07/13/2017  . Intracerebral hemorrhage 07/10/2017  . ICH (intracerebral hemorrhage) ([place]) 07/10/2017  . Unstable angina ([place]) 03/12/2013    Silvestre Mesi 05/12/2019, 10:01 AM  [facility] Physical Therapy [address] [place], [place], [address] Phone: [phone]   Fax:  [phone]  Name: Justin Adams MRN: [id number] Date of Birth: 14-Jun-1945

## 2019-05-14 ENCOUNTER — Encounter

## 2019-05-21 ENCOUNTER — Ambulatory Visit (INDEPENDENT_AMBULATORY_CARE_PROVIDER_SITE_OTHER): Payer: Medicare Other | Admitting: Physical Therapy

## 2019-05-21 ENCOUNTER — Other Ambulatory Visit: Payer: Self-pay

## 2019-05-21 DIAGNOSIS — G8929 Other chronic pain: Secondary | ICD-10-CM

## 2019-05-21 DIAGNOSIS — M6281 Muscle weakness (generalized): Secondary | ICD-10-CM

## 2019-05-21 DIAGNOSIS — R2681 Unsteadiness on feet: Secondary | ICD-10-CM | POA: Diagnosis not present

## 2019-05-21 DIAGNOSIS — M545 Low back pain: Secondary | ICD-10-CM | POA: Diagnosis not present

## 2019-05-21 DIAGNOSIS — I69154 Hemiplegia and hemiparesis following nontraumatic intracerebral hemorrhage affecting left non-dominant side: Secondary | ICD-10-CM | POA: Diagnosis not present

## 2019-05-21 NOTE — Therapy (Signed)
[facility] Physical Therapy [address] [place], [place], [address] Phone: [phone]   Fax:  [phone]  Physical Therapy Treatment  Patient Details  Name: Justin Adams MRN: [id number] Date of Birth: December 04, 1945 Referring Provider (PT): [name], MD   Encounter Date: 05/21/2019  PT End of Session - 05/21/19 1310    Visit Number  13    Number of Visits  24    Date for PT Re-Evaluation  63/01/60   recert performed on 10/9 to extend POC   Authorization Type  MCR AARP    PT Start Time  1145    PT Stop Time  1230    PT Time Calculation (min)  45 min    Activity Tolerance  Patient tolerated treatment well    Behavior During Therapy  [facility] for tasks assessed/performed       Past Medical History:  Diagnosis Date  . Adenomatous polyp   . Bleeding internal hemorrhoids 10/2017  . Cerebral venous thrombosis   . Grade I diastolic dysfunction 32/35/5732   noted on ECHO   . Hemorrhoids   . History of kidney stones   . Hypercholesteremia   . Hypertension   . Intracranial hemorrhage (HCC)    Right temporal lobe  . LVH (left ventricular hypertrophy) 07/11/2017   Mild, noted on ECHO   . Muscular degeneration   . Nephrolithiasis   . Obesity   . OSA (obstructive sleep apnea)   . Polycythemia   . Prostate cancer ([place]) 2003  . Shingles   . Stroke ([place]) 06/2017   No residual  . Unsteady gait   . Ureteral calculi 02/12/2008   Left    Past Surgical History:  Procedure Laterality Date  . APPENDECTOMY     childhood  . CHOLECYSTECTOMY    . COLONOSCOPY    . HEMORRHOID BANDING  2015  . HOLMIUM LASER APPLICATION Left 20/07/5425   Procedure: HOLMIUM LASER APPLICATION;  Surgeon: Franchot Gallo, MD;  Location: [facility];  Service: Urology;  Laterality: Left;  . IR ANGIO EXTERNAL CAROTID SEL EXT CAROTID UNI R MOD SED  07/10/2017  . IR ANGIO INTRA EXTRACRAN SEL INTERNAL CAROTID BILAT MOD SED  07/10/2017  . IR ANGIO VERTEBRAL SEL VERTEBRAL BILAT MOD SED   07/10/2017  . IR URETERAL STENT LEFT NEW ACCESS W/O SEP NEPHROSTOMY CATH  03/12/2018  . LEFT HEART CATHETERIZATION WITH CORONARY ANGIOGRAM N/A 03/15/2013   Procedure: LEFT HEART CATHETERIZATION WITH CORONARY ANGIOGRAM;  Surgeon: Minus Breeding, MD;  Location: [facility] CATH LAB;  Service: Cardiovascular;  Laterality: N/A;  . NEPHROLITHOTOMY Left 03/12/2018   Procedure: LEFT NEPHROLITHOTOMY PERCUTANEOUS, STENT PLACEMENT;  Surgeon: Franchot Gallo, MD;  Location: [facility];  Service: Urology;  Laterality: Left;  . PROSTATECTOMY    . STONE EXTRACTION WITH BASKET      There were no vitals filed for this visit.  Subjective Assessment - 05/21/19 1306    Subjective  "I was on vacation last week, my back felt good, I was trying to move a 70 lb lamp for my wife this morning which tweeked my back some. Do you think an inversion table would help?"    Pertinent History  PMH: HTN, CVA 06/2017, prostate Ca 2003,    Patient Stated Goals  improve gait and conditioning    Currently in Pain?  Yes    Pain Score  3     Pain Location  Back    Pain Orientation  Lower    Pain Descriptors / Indicators  Aching  Pain Type  Chronic pain          [facility] Adult PT Treatment/Exercise - 05/21/19 0001      Lumbar Exercises: Stretches   Single Knee to Chest Stretch  Right;Left;2 reps;30 seconds    Lower Trunk Rotation  5 reps;10 seconds      Lumbar Exercises: Aerobic   Recumbent Bike  7 min L2      Modalities   Modalities  Traction      Traction   Type of Traction  Lumbar    Min (lbs)  75    Max (lbs)  90    Time  20 min total preset program intermittent pull/hold             PT Education - 05/21/19 1309    Education Details  rational for mechanical traction    Person(s) Educated  Patient    Methods  Explanation    Comprehension  Verbalized understanding          PT Long Term Goals - 05/12/19 0911      PT LONG TERM GOAL #1   Title  Pt will report performing HEP, walking program and transition  to gym at end of PT    Baseline  compliant with old HEP but progressed HEP today due to progress    Time  6    Period  Weeks    Status  On-going      PT LONG TERM GOAL #2   Title  Pt will increaese overall leg strength to at least 4+ to 5- overall to improve function    Baseline  Met 05/12/19    Time  6    Period  Weeks    Status  Achieved      PT LONG TERM GOAL #3   Title  Will improve BERG to at least 53 to show improved balance    Baseline  now 54 on 12/2    Time  6    Period  Weeks    Status  Achieved      PT LONG TERM GOAL #4   Title  Will improve FGA to at least 29 to lower falls risk    Baseline  27    Time  6    Period  Weeks    Status  On-going      PT LONG TERM GOAL #5   Title  Will improve 6 minute walk test to 1560 ft    Baseline  will test  next session    Time  6    Period  Weeks    Status  On-going            Plan - 05/21/19 1310    Clinical Impression Statement  He was inquiring if he should get inversion table thus he was trialed on mechanical traction today to simulate what an inversion table would do for home. PT thinks this may help him with overall lumbar pain and spinal decompression due to his likely stenosis. He then was performed lumbar stretching and rode on the bike. He is not sure yet if the traction made a lot of difference but he will assess how he feels over the weekend. he also complains of neck pain today and he was informed that PT would need MD presciption to be able to treat this as well.    Personal Factors and Comorbidities  Comorbidity 1;Comorbidity 2    Comorbidities  PMH: HTN, CVA 06/2017, prostate Ca 2003,  Examination-Activity Limitations  Bend;Squat;Stairs;Stand;Lift;Locomotion Level    Examination-Participation Restrictions  Community Activity;Shop;Yard Work    [employer] officer  Evolving/Moderate complexity    Rehab Potential  Good    PT Frequency  2x / week    PT Duration  6 weeks    PT  Treatment/Interventions  Aquatic Therapy;Cryotherapy;Electrical Stimulation;Moist Heat;Gait training;Stair training;Functional mobility training;Therapeutic activities;Therapeutic exercise;Balance training;Neuromuscular re-education;Manual techniques;Passive range of motion;Taping;Joint Manipulations    PT Next Visit Plan  added low back into POC, needs gait, balance, LE strength and conditioning    PT Home Exercise Plan  Access Code: [email or web address], added parial lunges, sit to stands, step ups fwd, lat, retro, also added LTR, SKTC, pelvic tilts, bridges, hip flexor stretch, HSS    Consulted and Agree with Plan of Care  Patient       Patient will benefit from skilled therapeutic intervention in order to improve the following deficits and impairments:  Abnormal gait, Decreased activity tolerance, Decreased balance, Decreased endurance, Decreased strength, Difficulty walking  Visit Diagnosis: Unsteadiness on feet  Muscle weakness (generalized)  Chronic bilateral low back pain without sciatica  Hemiplegia and hemiparesis following nontraumatic intracerebral hemorrhage affecting left non-dominant side [facility])     Problem List Patient Active Problem List   Diagnosis Date Noted  . Calculus of kidney 03/12/2018  . Bleeding internal hemorrhoids 10/09/2017  . Sleep apnea 09/22/2017  . History of adenomatous polyp of colon 09/18/2017  . Prostate cancer ([place]) 09/18/2017  . Essential hypertension 08/26/2017  . Hyperlipidemia 08/26/2017  . Cerebral venous thrombosis 07/13/2017  . Intracerebral hemorrhage 07/10/2017  . ICH (intracerebral hemorrhage) ([place]) 07/10/2017  . Unstable angina ([place]) 03/12/2013    Silvestre Mesi 05/21/2019, 1:16 PM  [facility] Physical Therapy [address] [place], [place], [address] Phone: [phone]   Fax:  [phone]  Name: Justin Adams MRN: [id number] Date of Birth: 23-Jul-1945

## 2019-05-24 DIAGNOSIS — H43812 Vitreous degeneration, left eye: Secondary | ICD-10-CM | POA: Diagnosis not present

## 2019-05-24 DIAGNOSIS — H353221 Exudative age-related macular degeneration, left eye, with active choroidal neovascularization: Secondary | ICD-10-CM | POA: Diagnosis not present

## 2019-05-25 ENCOUNTER — Encounter: Payer: Medicare Other | Admitting: Physical Therapy

## 2019-05-27 ENCOUNTER — Encounter: Payer: Medicare Other | Admitting: Physical Therapy

## 2019-06-01 ENCOUNTER — Ambulatory Visit (INDEPENDENT_AMBULATORY_CARE_PROVIDER_SITE_OTHER): Payer: Medicare Other | Admitting: Physical Therapy

## 2019-06-01 ENCOUNTER — Other Ambulatory Visit: Payer: Self-pay

## 2019-06-01 DIAGNOSIS — I69154 Hemiplegia and hemiparesis following nontraumatic intracerebral hemorrhage affecting left non-dominant side: Secondary | ICD-10-CM | POA: Diagnosis not present

## 2019-06-01 DIAGNOSIS — G8929 Other chronic pain: Secondary | ICD-10-CM

## 2019-06-01 DIAGNOSIS — M6281 Muscle weakness (generalized): Secondary | ICD-10-CM

## 2019-06-01 DIAGNOSIS — M545 Low back pain, unspecified: Secondary | ICD-10-CM

## 2019-06-01 DIAGNOSIS — R2681 Unsteadiness on feet: Secondary | ICD-10-CM

## 2019-06-01 NOTE — Therapy (Signed)
[facility] Physical Therapy [address] [place], [place], [address] Phone: [phone]   Fax:  [phone]  Physical Therapy Treatment  Patient Details  Name: Justin Adams MRN: [id number] Date of Birth: [birth year] Referring Provider (PT): [name], MD   Encounter Date: 06/01/2019  PT End of Session - 06/01/19 0859    Visit Number  14    Number of Visits  24    Date for PT Re-Evaluation  81/77/11   recert performed on 65/7 to extend POC   Authorization Type  MCR AARP    PT Start Time  0805    PT Stop Time  0850    PT Time Calculation (min)  45 min    Activity Tolerance  Patient tolerated treatment well    Behavior During Therapy  [facility] for tasks assessed/performed       Past Medical History:  Diagnosis Date  . Adenomatous polyp   . Bleeding internal hemorrhoids 10/2017  . Cerebral venous thrombosis   . Grade I diastolic dysfunction 90/38/3338   noted on ECHO   . Hemorrhoids   . History of kidney stones   . Hypercholesteremia   . Hypertension   . Intracranial hemorrhage (HCC)    Right temporal lobe  . LVH (left ventricular hypertrophy) 07/11/2017   Mild, noted on ECHO   . Muscular degeneration   . Nephrolithiasis   . Obesity   . OSA (obstructive sleep apnea)   . Polycythemia   . Prostate cancer ([place]) 2003  . Shingles   . Stroke ([place]) 06/2017   No residual  . Unsteady gait   . Ureteral calculi 02/12/2008   Left    Past Surgical History:  Procedure Laterality Date  . APPENDECTOMY     childhood  . CHOLECYSTECTOMY    . COLONOSCOPY    . HEMORRHOID BANDING  2015  . HOLMIUM LASER APPLICATION Left 32/02/1915   Procedure: HOLMIUM LASER APPLICATION;  Surgeon: Franchot Gallo, MD;  Location: [facility];  Service: Urology;  Laterality: Left;  . IR ANGIO EXTERNAL CAROTID SEL EXT CAROTID UNI R MOD SED  07/10/2017  . IR ANGIO INTRA EXTRACRAN SEL INTERNAL CAROTID BILAT MOD SED  07/10/2017  . IR ANGIO VERTEBRAL SEL VERTEBRAL BILAT MOD SED   07/10/2017  . IR URETERAL STENT LEFT NEW ACCESS W/O SEP NEPHROSTOMY CATH  03/12/2018  . LEFT HEART CATHETERIZATION WITH CORONARY ANGIOGRAM N/A 03/15/2013   Procedure: LEFT HEART CATHETERIZATION WITH CORONARY ANGIOGRAM;  Surgeon: Minus Breeding, MD;  Location: [facility] CATH LAB;  Service: Cardiovascular;  Laterality: N/A;  . NEPHROLITHOTOMY Left 03/12/2018   Procedure: LEFT NEPHROLITHOTOMY PERCUTANEOUS, STENT PLACEMENT;  Surgeon: Franchot Gallo, MD;  [facility];  Service: Urology;  Laterality: Left;  . PROSTATECTOMY    . STONE EXTRACTION WITH BASKET      There were no vitals filed for this visit.  Subjective Assessment - 06/01/19 0822    Subjective  no pain or complaints today, I felt good after last session after the traction    Pertinent History  PMH: HTN, CVA 06/2017, prostate Ca 2003,    Patient Stated Goals  improve gait and conditioning    Currently in Pain?  No/denies    Pain Onset  More than a month ago                       [facility] Adult PT Treatment/Exercise - 06/01/19 0001      Lumbar Exercises: Stretches   Single Knee to Chest  Stretch  Right;Left;2 reps;30 seconds    Lower Trunk Rotation  5 reps;10 seconds      Lumbar Exercises: Aerobic   Recumbent Bike  6 min L2      Lumbar Exercises: Standing   Row  20 reps    Theraband Level (Row)  Level 4 (Blue)    Shoulder Extension  20 reps    Theraband Level (Shoulder Extension)  Level 4 (Blue)    Other Standing Lumbar Exercises  deadlift with L4 blue band X 15 reps cues and demo for body mechanics and technique.     Other Standing Lumbar Exercises  farmers carry 10 lb KB one lap down and back in gym for each side      Lumbar Exercises: Seated   Sit to Stand Limitations  UE push up from knees with airex pad in chair 2X5      Lumbar Exercises: Quadruped   Straight Leg Raise  10 reps      Modalities   Modalities  Traction      Traction   Type of Traction  Lumbar    Min (lbs)  80    Max (lbs)  90    Time   15 min total preset program                  PT Long Term Goals - 05/12/19 0911      PT LONG TERM GOAL #1   Title  Pt will report performing HEP, walking program and transition to gym at end of PT    Baseline  compliant with old HEP but progressed HEP today due to progress    Time  6    Period  Weeks    Status  On-going      PT LONG TERM GOAL #2   Title  Pt will increaese overall leg strength to at least 4+ to 5- overall to improve function    Baseline  Met 05/12/19    Time  6    Period  Weeks    Status  Achieved      PT LONG TERM GOAL #3   Title  Will improve BERG to at least 53 to show improved balance    Baseline  now 54 on 12/2    Time  6    Period  Weeks    Status  Achieved      PT LONG TERM GOAL #4   Title  Will improve FGA to at least 29 to lower falls risk    Baseline  27    Time  6    Period  Weeks    Status  On-going      PT LONG TERM GOAL #5   Title  Will improve 6 minute walk test to 1560 ft    Baseline  will test  next session    Time  6    Period  Weeks    Status  On-going            Plan - 06/01/19 0900    Clinical Impression Statement  Session focused on core strength, general strength, and lumbar stretching with good tolerance. Mechanical traction continued for spinal decompression after positive return from last session. Continue POC    Personal Factors and Comorbidities  Comorbidity 1;Comorbidity 2    Comorbidities  PMH: HTN, CVA 06/2017, prostate Ca 2003,    Examination-Activity Limitations  Bend;Squat;Stairs;Stand;Lift;Locomotion Level    Examination-Participation Restrictions  Community Activity;Shop;Yard Work    Stability/Clinical  Decision Making  Evolving/Moderate complexity    Rehab Potential  Good    PT Frequency  2x / week    PT Duration  6 weeks    PT Treatment/Interventions  Aquatic Therapy;Cryotherapy;Electrical Stimulation;Moist Heat;Gait training;Stair training;Functional mobility training;Therapeutic  activities;Therapeutic exercise;Balance training;Neuromuscular re-education;Manual techniques;Passive range of motion;Taping;Joint Manipulations    PT Next Visit Plan  added low back into POC, needs gait, balance, LE strength and conditioning    PT Home Exercise Plan  Access Code: [email or web address], added parial lunges, sit to stands, step ups fwd, lat, retro, also added LTR, SKTC, pelvic tilts, bridges, hip flexor stretch, HSS    Consulted and Agree with Plan of Care  Patient       Patient will benefit from skilled therapeutic intervention in order to improve the following deficits and impairments:  Abnormal gait, Decreased activity tolerance, Decreased balance, Decreased endurance, Decreased strength, Difficulty walking  Visit Diagnosis: Unsteadiness on feet  Muscle weakness (generalized)  Chronic bilateral low back pain without sciatica  Hemiplegia and hemiparesis following nontraumatic intracerebral hemorrhage affecting left non-dominant side [facility])     Problem List Patient Active Problem List   Diagnosis Date Noted  . Calculus of kidney 03/12/2018  . Bleeding internal hemorrhoids 10/09/2017  . Sleep apnea 09/22/2017  . History of adenomatous polyp of colon 09/18/2017  . Prostate cancer ([place]) 09/18/2017  . Essential hypertension 08/26/2017  . Hyperlipidemia 08/26/2017  . Cerebral venous thrombosis 07/13/2017  . Intracerebral hemorrhage 07/10/2017  . ICH (intracerebral hemorrhage) ([place]) 07/10/2017  . Unstable angina [facility]) 03/12/2013    Justin Adams 06/01/2019, 9:03 AM  [facility] Physical Therapy [address] [place], [place], [address] Phone: [phone]   Fax:  [phone]  Name: Justin Adams MRN: [id number] Date of Birth: [birth year]

## 2019-06-08 ENCOUNTER — Other Ambulatory Visit: Payer: Self-pay

## 2019-06-08 ENCOUNTER — Ambulatory Visit (INDEPENDENT_AMBULATORY_CARE_PROVIDER_SITE_OTHER): Payer: Medicare Other | Admitting: Physical Therapy

## 2019-06-08 DIAGNOSIS — I69154 Hemiplegia and hemiparesis following nontraumatic intracerebral hemorrhage affecting left non-dominant side: Secondary | ICD-10-CM

## 2019-06-08 DIAGNOSIS — G8929 Other chronic pain: Secondary | ICD-10-CM

## 2019-06-08 DIAGNOSIS — M6281 Muscle weakness (generalized): Secondary | ICD-10-CM | POA: Diagnosis not present

## 2019-06-08 DIAGNOSIS — R2681 Unsteadiness on feet: Secondary | ICD-10-CM | POA: Diagnosis not present

## 2019-06-08 DIAGNOSIS — M545 Low back pain: Secondary | ICD-10-CM | POA: Diagnosis not present

## 2019-06-08 NOTE — Therapy (Signed)
[facility] Physical Therapy [address] [place], [place], [address] Phone: [phone]   Fax:  [phone]  Physical Therapy Treatment  Patient Details  Name: Justin Adams MRN: [id number] Date of Birth: 14-Aug-1945 Referring Provider (PT): [name], MD   Encounter Date: 06/08/2019  PT End of Session - 06/08/19 0820    Visit Number  15    Number of Visits  24    Date for PT Re-Evaluation  66/29/47   recert performed on 65/4 to extend POC   Authorization Type  MCR AARP    PT Start Time  0803    PT Stop Time  0853    PT Time Calculation (min)  50 min    Activity Tolerance  Patient tolerated treatment well    Behavior During Therapy  [facility] for tasks assessed/performed       Past Medical History:  Diagnosis Date  . Adenomatous polyp   . Bleeding internal hemorrhoids 10/2017  . Cerebral venous thrombosis   . Grade I diastolic dysfunction 65/08/5463   noted on ECHO   . Hemorrhoids   . History of kidney stones   . Hypercholesteremia   . Hypertension   . Intracranial hemorrhage (HCC)    Right temporal lobe  . LVH (left ventricular hypertrophy) 07/11/2017   Mild, noted on ECHO   . Muscular degeneration   . Nephrolithiasis   . Obesity   . OSA (obstructive sleep apnea)   . Polycythemia   . Prostate cancer ([place]) 2003  . Shingles   . Stroke ([place]) 06/2017   No residual  . Unsteady gait   . Ureteral calculi 02/12/2008   Left    Past Surgical History:  Procedure Laterality Date  . APPENDECTOMY     childhood  . CHOLECYSTECTOMY    . COLONOSCOPY    . HEMORRHOID BANDING  2015  . HOLMIUM LASER APPLICATION Left 68/06/2749   Procedure: HOLMIUM LASER APPLICATION;  Surgeon: Franchot Gallo, MD;  [facility];  Service: Urology;  Laterality: Left;  . IR ANGIO EXTERNAL CAROTID SEL EXT CAROTID UNI R MOD SED  07/10/2017  . IR ANGIO INTRA EXTRACRAN SEL INTERNAL CAROTID BILAT MOD SED  07/10/2017  . IR ANGIO VERTEBRAL SEL VERTEBRAL BILAT MOD SED   07/10/2017  . IR URETERAL STENT LEFT NEW ACCESS W/O SEP NEPHROSTOMY CATH  03/12/2018  . LEFT HEART CATHETERIZATION WITH CORONARY ANGIOGRAM N/A 03/15/2013   Procedure: LEFT HEART CATHETERIZATION WITH CORONARY ANGIOGRAM;  Surgeon: Minus Breeding, MD;  Location: [facility] CATH LAB;  Service: Cardiovascular;  Laterality: N/A;  . NEPHROLITHOTOMY Left 03/12/2018   Procedure: LEFT NEPHROLITHOTOMY PERCUTANEOUS, STENT PLACEMENT;  Surgeon: Franchot Gallo, MD;  [facility];  Service: Urology;  Laterality: Left;  . PROSTATECTOMY    . STONE EXTRACTION WITH BASKET      There were no vitals filed for this visit.  Subjective Assessment - 06/08/19 0818    Subjective  My back hurt over the weekend after a busy of week of hosting family for the holidays. It feels some better today pain about 3/10 overall    Pertinent History  PMH: HTN, CVA 06/2017, prostate Ca 2003,    Patient Stated Goals  improve gait and conditioning    Pain Onset  More than a month ago          [facility] Adult PT Treatment/Exercise - 06/08/19 0001      Lumbar Exercises: Stretches   Single Knee to Chest Stretch  Right;Left;2 reps;30 seconds    Lower  Trunk Rotation  5 reps;10 seconds    Other Lumbar Stretch Exercise  seated lumbar flexion Pball roll outs 10 sec X 5 in front and X 3 to each side      Lumbar Exercises: Aerobic   Recumbent Bike  --    Nustep  8  min L5 UE/LE      Lumbar Exercises: Machines for Strengthening   Leg Press  Shuttle leg press 87 lbs 2X15 reps      Lumbar Exercises: Standing   Row  20 reps    Theraband Level (Row)  Level 4 (Blue)    Shoulder Extension  20 reps    Theraband Level (Shoulder Extension)  Level 4 (Blue)    Other Standing Lumbar Exercises  deadlift with L4 blue band X 15 reps cues and demo for body mechanics and technique.     Other Standing Lumbar Exercises  farmers carry 10 lb KB one lap down and back in gym for each side      Lumbar Exercises: Seated   Sit to Stand Limitations  UE push up  from knees with airex pad in chair 2X5      Lumbar Exercises: Quadruped   Straight Leg Raise  10 reps      Modalities   Modalities  Traction      Traction   Type of Traction  Lumbar    Min (lbs)  80    Max (lbs)  90    Time  15 min total preset program         PT Long Term Goals - 05/12/19 0911      PT LONG TERM GOAL #1   Title  Pt will report performing HEP, walking program and transition to gym at end of PT    Baseline  compliant with old HEP but progressed HEP today due to progress    Time  6    Period  Weeks    Status  On-going      PT LONG TERM GOAL #2   Title  Pt will increaese overall leg strength to at least 4+ to 5- overall to improve function    Baseline  Met 05/12/19    Time  6    Period  Weeks    Status  Achieved      PT LONG TERM GOAL #3   Title  Will improve BERG to at least 53 to show improved balance    Baseline  now 54 on 12/2    Time  6    Period  Weeks    Status  Achieved      PT LONG TERM GOAL #4   Title  Will improve FGA to at least 29 to lower falls risk    Baseline  27    Time  6    Period  Weeks    Status  On-going      PT LONG TERM GOAL #5   Title  Will improve 6 minute walk test to 1560 ft    Baseline  will test  next session    Time  6    Period  Weeks    Status  On-going            Plan - 06/08/19 0844    Clinical Impression Statement  Progressed weight with mechanical traction today slightly to increase spinal decompression. He had good tolerance to strength and stretching program without complaints. PT will continue to progress as able toward his funcitonal goals.  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    Rehab  Potential  Good    PT Frequency  2x / week    PT Duration  6 weeks    PT Treatment/Interventions  Aquatic Therapy;Cryotherapy;Electrical Stimulation;Moist Heat;Gait training;Stair training;Functional mobility training;Therapeutic activities;Therapeutic exercise;Balance training;Neuromuscular re-education;Manual techniques;Passive range of motion;Taping;Joint Manipulations    PT Next Visit Plan  lumbar stretch and strength, needs gait, balance, LE strength and conditioning    PT Home Exercise Plan  Access Code: [email or web address], added parial lunges, sit to stands, step ups fwd, lat, retro, also added LTR, SKTC, pelvic tilts, bridges, hip flexor stretch, HSS    Consulted and Agree with Plan of Care  Patient       Patient will benefit from skilled therapeutic intervention in order to improve the following deficits and impairments:  Abnormal gait, Decreased activity tolerance, Decreased balance, Decreased endurance, Decreased strength, Difficulty walking  Visit Diagnosis: Unsteadiness on feet  Muscle weakness (generalized)  Chronic bilateral low back pain without sciatica  Hemiplegia and hemiparesis following nontraumatic intracerebral hemorrhage affecting left non-dominant side [facility])     Problem List Patient Active Problem List   Diagnosis Date Noted  . Calculus of kidney 03/12/2018  . Bleeding internal hemorrhoids 10/09/2017  . Sleep apnea 09/22/2017  . History of adenomatous polyp of colon 09/18/2017  . Prostate cancer ([place]) 09/18/2017  . Essential hypertension 08/26/2017  . Hyperlipidemia 08/26/2017  . Cerebral venous thrombosis 07/13/2017  . Intracerebral hemorrhage 07/10/2017  . ICH (intracerebral hemorrhage) ([place]) 07/10/2017  . Unstable angina ([place]) 03/12/2013    Debbe Odea, PT,DPT 06/08/2019, 9:34 AM  [facility] Physical Therapy [address] [place], [place], [address] Phone: [phone]   Fax:  [phone]  Name: Justin Adams MRN:  [id number] Date of Birth: [birth year]

## 2019-06-10 ENCOUNTER — Ambulatory Visit (INDEPENDENT_AMBULATORY_CARE_PROVIDER_SITE_OTHER): Payer: Medicare Other | Admitting: Physical Therapy

## 2019-06-10 ENCOUNTER — Other Ambulatory Visit: Payer: Self-pay

## 2019-06-10 DIAGNOSIS — I69154 Hemiplegia and hemiparesis following nontraumatic intracerebral hemorrhage affecting left non-dominant side: Secondary | ICD-10-CM | POA: Diagnosis not present

## 2019-06-10 DIAGNOSIS — M6281 Muscle weakness (generalized): Secondary | ICD-10-CM

## 2019-06-10 DIAGNOSIS — G8929 Other chronic pain: Secondary | ICD-10-CM | POA: Diagnosis not present

## 2019-06-10 DIAGNOSIS — R2681 Unsteadiness on feet: Secondary | ICD-10-CM

## 2019-06-10 DIAGNOSIS — M545 Low back pain: Secondary | ICD-10-CM

## 2019-06-10 NOTE — Therapy (Signed)
[facility] Physical Therapy [address] [place], [place], [address] Phone: [phone]   Fax:  [phone]  Physical Therapy Treatment  Patient Details  Name: Justin Adams MRN: [id number] Date of Birth: 21-Apr-1946 Referring Provider (PT): [name], MD   Encounter Date: 06/10/2019  PT End of Session - 06/10/19 0859    Visit Number  16    Number of Visits  24    Date for PT Re-Evaluation  87/86/76   recert performed on 72/0 to extend POC   Authorization Type  MCR AARP    PT Start Time  0805    PT Stop Time  0900    PT Time Calculation (min)  55 min    Activity Tolerance  Patient tolerated treatment well    Behavior During Therapy  [facility] for tasks assessed/performed       Past Medical History:  Diagnosis Date  . Adenomatous polyp   . Bleeding internal hemorrhoids 10/2017  . Cerebral venous thrombosis   . Grade I diastolic dysfunction 94/70/9628   noted on ECHO   . Hemorrhoids   . History of kidney stones   . Hypercholesteremia   . Hypertension   . Intracranial hemorrhage (HCC)    Right temporal lobe  . LVH (left ventricular hypertrophy) 07/11/2017   Mild, noted on ECHO   . Muscular degeneration   . Nephrolithiasis   . Obesity   . OSA (obstructive sleep apnea)   . Polycythemia   . Prostate cancer ([place]) 2003  . Shingles   . Stroke ([place]) 06/2017   No residual  . Unsteady gait   . Ureteral calculi 02/12/2008   Left    Past Surgical History:  Procedure Laterality Date  . APPENDECTOMY     childhood  . CHOLECYSTECTOMY    . COLONOSCOPY    . HEMORRHOID BANDING  2015  . HOLMIUM LASER APPLICATION Left 36/11/2945   Procedure: HOLMIUM LASER APPLICATION;  Surgeon: Franchot Gallo, MD;  [facility];  Service: Urology;  Laterality: Left;  . IR ANGIO EXTERNAL CAROTID SEL EXT CAROTID UNI R MOD SED  07/10/2017  . IR ANGIO INTRA EXTRACRAN SEL INTERNAL CAROTID BILAT MOD SED  07/10/2017  . IR ANGIO VERTEBRAL SEL VERTEBRAL BILAT MOD SED   07/10/2017  . IR URETERAL STENT LEFT NEW ACCESS W/O SEP NEPHROSTOMY CATH  03/12/2018  . LEFT HEART CATHETERIZATION WITH CORONARY ANGIOGRAM N/A 03/15/2013   Procedure: LEFT HEART CATHETERIZATION WITH CORONARY ANGIOGRAM;  Surgeon: Minus Breeding, MD;  Location: [facility] CATH LAB;  Service: Cardiovascular;  Laterality: N/A;  . NEPHROLITHOTOMY Left 03/12/2018   Procedure: LEFT NEPHROLITHOTOMY PERCUTANEOUS, STENT PLACEMENT;  Surgeon: Franchot Gallo, MD;  [facility];  Service: Urology;  Laterality: Left;  . PROSTATECTOMY    . STONE EXTRACTION WITH BASKET      There were no vitals filed for this visit.  Subjective Assessment - 06/10/19 0857    Subjective  I am fine today no pain but had back pain last night and not sure why, I hope I am not having another kidney stone.    Pertinent History  PMH: HTN, CVA 06/2017, prostate Ca 2003,    Patient Stated Goals  improve gait and conditioning    Pain Onset  More than a month ago        [facility] Adult PT Treatment/Exercise - 06/10/19 0001      Lumbar Exercises: Stretches   Single Knee to Chest Stretch  Right;Left;2 reps;30 seconds    Lower Trunk Rotation  5 reps;10 seconds    Other Lumbar Stretch Exercise  standing L stretch at counter 5 sec X 10 reps      Lumbar Exercises: Aerobic   Nustep  8  min L5 UE/LE (While PT helping him put fitness app on his phone)      Lumbar Exercises: Machines for Strengthening   Leg Press  Shuttle leg press 93 lbs 3x10 reps      Lumbar Exercises: Standing   Row  --    Theraband Level (Row)  --    Row Limitations  Bent over unilat row 10 lbs X 15 both sides    Shoulder Extension  --    Theraband Level (Shoulder Extension)  --    Other Standing Lumbar Exercises  --    Other Standing Lumbar Exercises  farmers carry 12 lb KB one lap down and back in gym for each side      Lumbar Exercises: Seated   Sit to Stand Limitations  UE push up from knees with airex pad in chair 2X5      Lumbar Exercises: Quadruped    Straight Leg Raise  --      Modalities   Modalities  Traction      Traction   Type of Traction  Lumbar    Min (lbs)  80    Max (lbs)  90    Time  15 min total preset program                  PT Long Term Goals - 05/12/19 0911      PT LONG TERM GOAL #1   Title  Pt will report performing HEP, walking program and transition to gym at end of PT    Baseline  compliant with old HEP but progressed HEP today due to progress    Time  6    Period  Weeks    Status  On-going      PT LONG TERM GOAL #2   Title  Pt will increaese overall leg strength to at least 4+ to 5- overall to improve function    Baseline  Met 05/12/19    Time  6    Period  Weeks    Status  Achieved      PT LONG TERM GOAL #3   Title  Will improve BERG to at least 53 to show improved balance    Baseline  now 54 on 12/2    Time  6    Period  Weeks    Status  Achieved      PT LONG TERM GOAL #4   Title  Will improve FGA to at least 29 to lower falls risk    Baseline  27    Time  6    Period  Weeks    Status  On-going      PT LONG TERM GOAL #5   Title  Will improve 6 minute walk test to 1560 ft    Baseline  will test  next session    Time  6    Period  Weeks    Status  On-going            Plan - 06/10/19 0900    Clinical Impression Statement  Continued to focus on core/lumbar/LE strength and he did not have any pain with session. Traction continued for spinal decompression as he relays it appears to be helping. PT will assess goals next week.    Personal Factors  and Comorbidities  Comorbidity 1;Comorbidity 2    Comorbidities  PMH: HTN, CVA 06/2017, prostate Ca 2003,    Examination-Activity Limitations  Bend;Squat;Stairs;Stand;Lift;Locomotion Level    Examination-Participation Restrictions  Community Activity;Shop;Yard Work    [employer]chant [employer] officer  Evolving/Moderate complexity    Rehab Potential  Good    PT Frequency  2x / week    PT Duration  6 weeks    PT  Treatment/Interventions  Aquatic Therapy;Cryotherapy;Electrical Stimulation;Moist Heat;Gait training;Stair training;Functional mobility training;Therapeutic activities;Therapeutic exercise;Balance training;Neuromuscular re-education;Manual techniques;Passive range of motion;Taping;Joint Manipulations    PT Next Visit Plan  lumbar stretch and strength, needs gait, balance, LE strength and conditioning    PT Home Exercise Plan  Access Code: [email or web address], added parial lunges, sit to stands, step ups fwd, lat, retro, also added LTR, SKTC, pelvic tilts, bridges, hip flexor stretch, HSS    Consulted and Agree with Plan of Care  Patient       Patient will benefit from skilled therapeutic intervention in order to improve the following deficits and impairments:  Abnormal gait, Decreased activity tolerance, Decreased balance, Decreased endurance, Decreased strength, Difficulty walking  Visit Diagnosis: Unsteadiness on feet  Muscle weakness (generalized)  Chronic bilateral low back pain without sciatica  Hemiplegia and hemiparesis following nontraumatic intracerebral hemorrhage affecting left non-dominant side [facility])     Problem List Patient Active Problem List   Diagnosis Date Noted  . Calculus of kidney 03/12/2018  . Bleeding internal hemorrhoids 10/09/2017  . Sleep apnea 09/22/2017  . History of adenomatous polyp of colon 09/18/2017  . Prostate cancer ([place]) 09/18/2017  . Essential hypertension 08/26/2017  . Hyperlipidemia 08/26/2017  . Cerebral venous thrombosis 07/13/2017  . Intracerebral hemorrhage 07/10/2017  . ICH (intracerebral hemorrhage) ([place]) 07/10/2017  . Unstable angina ([place]) 03/12/2013    Debbe Odea, PT,DPT 06/10/2019, 9:55 AM  [facility] Physical Therapy [address] [place], [place], [address] Phone: [phone]   Fax:  [phone]  Name: Justin Adams MRN: [id number] Date of Birth: 06-12-1945

## 2019-06-25 ENCOUNTER — Other Ambulatory Visit: Payer: Self-pay

## 2019-06-25 ENCOUNTER — Ambulatory Visit (INDEPENDENT_AMBULATORY_CARE_PROVIDER_SITE_OTHER): Payer: Medicare Other | Admitting: Physical Therapy

## 2019-06-25 DIAGNOSIS — M6281 Muscle weakness (generalized): Secondary | ICD-10-CM

## 2019-06-25 DIAGNOSIS — M545 Low back pain: Secondary | ICD-10-CM

## 2019-06-25 DIAGNOSIS — G8929 Other chronic pain: Secondary | ICD-10-CM

## 2019-06-25 DIAGNOSIS — I69154 Hemiplegia and hemiparesis following nontraumatic intracerebral hemorrhage affecting left non-dominant side: Secondary | ICD-10-CM | POA: Diagnosis not present

## 2019-06-25 DIAGNOSIS — R2681 Unsteadiness on feet: Secondary | ICD-10-CM | POA: Diagnosis not present

## 2019-06-25 NOTE — Therapy (Signed)
[facility] [address] [place], [place], [address] Phone: [phone]   Fax:  [phone]  Physical Therapy Treatment  Patient Details  Name: Justin Adams MRN: [id number] Date of Birth: 1946/04/08 Referring Provider (PT): [name], MD   Encounter Date: 06/25/2019  PT End of Session - 06/25/19 1501    Visit Number  17    Number of Visits  24    Date for PT Re-Evaluation  19/50/93   recert performed on 26/7 to extend POC   Authorization Type  MCR AARP    PT Start Time  1400    PT Stop Time  1500    PT Time Calculation (min)  60 min    Activity Tolerance  Patient tolerated treatment well    Behavior During Therapy  [facility] for tasks assessed/performed       Past Medical History:  Diagnosis Date  . Adenomatous polyp   . Bleeding internal hemorrhoids 10/2017  . Cerebral venous thrombosis   . Grade I diastolic dysfunction 12/45/8099   noted on ECHO   . Hemorrhoids   . History of kidney stones   . Hypercholesteremia   . Hypertension   . Intracranial hemorrhage (HCC)    Right temporal lobe  . LVH (left ventricular hypertrophy) 07/11/2017   Mild, noted on ECHO   . Muscular degeneration   . Nephrolithiasis   . Obesity   . OSA (obstructive sleep apnea)   . Polycythemia   . Prostate cancer ([place]) 2003  . Shingles   . Stroke ([place]) 06/2017   No residual  . Unsteady gait   . Ureteral calculi 02/12/2008   Left    Past Surgical History:  Procedure Laterality Date  . APPENDECTOMY     childhood  . CHOLECYSTECTOMY    . COLONOSCOPY    . HEMORRHOID BANDING  2015  . HOLMIUM LASER APPLICATION Left 83/08/8248   Procedure: HOLMIUM LASER APPLICATION;  Surgeon: Franchot Gallo, MD;  Location: [facility];  Service: Urology;  Laterality: Left;  . IR ANGIO EXTERNAL CAROTID SEL EXT CAROTID UNI R MOD SED  07/10/2017  . IR ANGIO INTRA EXTRACRAN SEL INTERNAL CAROTID BILAT MOD SED  07/10/2017  . IR ANGIO VERTEBRAL SEL VERTEBRAL BILAT MOD SED   07/10/2017  . IR URETERAL STENT LEFT NEW ACCESS W/O SEP NEPHROSTOMY CATH  03/12/2018  . LEFT HEART CATHETERIZATION WITH CORONARY ANGIOGRAM N/A 03/15/2013   Procedure: LEFT HEART CATHETERIZATION WITH CORONARY ANGIOGRAM;  Surgeon: Minus Breeding, MD;  Location: [facility] CATH LAB;  Service: Cardiovascular;  Laterality: N/A;  . NEPHROLITHOTOMY Left 03/12/2018   Procedure: LEFT NEPHROLITHOTOMY PERCUTANEOUS, STENT PLACEMENT;  Surgeon: Franchot Gallo, MD;  Location[facility];  Service: Urology;  Laterality: Left;  . PROSTATECTOMY    . STONE EXTRACTION WITH BASKET      There were no vitals filed for this visit.  Subjective Assessment - 06/25/19 1500    Subjective  no complaints today, feels his back pain is improving. He had covid vaccination earlier today    Pertinent History  PMH: HTN, CVA 06/2017, prostate Ca 2003,    Patient Stated Goals  improve gait and conditioning    Currently in Pain?  No/denies    Pain Onset  More than a month ago                       [facility] Adult PT Treatment/Exercise - 06/25/19 0001      High Level Balance   High Level Balance  Comments  SLS 5 X each side avg of 6.5 sec on Lt and 7 sec on Rt, tandem balance on foam pad 30 sec X 2 bilat      Lumbar Exercises: Stretches   Other Lumbar Stretch Exercise  standing L stretch at counter 5 sec X 10 reps      Lumbar Exercises: Aerobic   Nustep  recumbant bike 10 min L1 for endurance      Lumbar Exercises: Machines for Strengthening   Leg Press  Shuttle leg press 100 lbs 2X15 reps      Lumbar Exercises: Standing   Row Limitations  bent over unilateral rows 10 lbs X 20 each side    Other Standing Lumbar Exercises  fwd and lateral step ups onto 6 inch step no UE support X 10 ea bilat    Other Standing Lumbar Exercises  farmers carry 15 lb KB two lap down and back in gym for each side      Modalities   Modalities  Traction      Traction   Type of Traction  Lumbar    Min (lbs)  80    Max (lbs)  95    Time   15 min total preset program                  PT Long Term Goals - 05/12/19 0911      PT LONG TERM GOAL #1   Title  Pt will report performing HEP, walking program and transition to gym at end of PT    Baseline  compliant with old HEP but progressed HEP today due to progress    Time  6    Period  Weeks    Status  On-going      PT LONG TERM GOAL #2   Title  Pt will increaese overall leg strength to at least 4+ to 5- overall to improve function    Baseline  Met 05/12/19    Time  6    Period  Weeks    Status  Achieved      PT LONG TERM GOAL #3   Title  Will improve BERG to at least 53 to show improved balance    Baseline  now 54 on 12/2    Time  6    Period  Weeks    Status  Achieved      PT LONG TERM GOAL #4   Title  Will improve FGA to at least 29 to lower falls risk    Baseline  27    Time  6    Period  Weeks    Status  On-going      PT LONG TERM GOAL #5   Title  Will improve 6 minute walk test to 1560 ft    Baseline  will test  next session    Time  6    Period  Weeks    Status  On-going            Plan - 06/25/19 1502    Clinical Impression Statement  Able to progress his strengthening and general conditioning progream without complaints. Continued with mechanical traciton as it appears to be helping with pain. Overall appears to be progressing in all areas with PT    Personal Factors and Comorbidities  Comorbidity 1;Comorbidity 2    Comorbidities  PMH: HTN, CVA 06/2017, prostate Ca 2003,    Examination-Activity Limitations  Bend;Squat;Stairs;Stand;Lift;Locomotion Level    Examination-Participation Restrictions  Community  Activity;Shop;Yard Work    [employer] officer  Evolving/Moderate complexity    Rehab Potential  Good    PT Frequency  2x / week    PT Duration  6 weeks    PT Treatment/Interventions  Aquatic Therapy;Cryotherapy;Electrical Stimulation;Moist Heat;Gait training;Stair training;Functional mobility training;Therapeutic  activities;Therapeutic exercise;Balance training;Neuromuscular re-education;Manual techniques;Passive range of motion;Taping;Joint Manipulations    PT Next Visit Plan  lumbar stretch and strength, needs gait, balance, LE strength and conditioning    PT Home Exercise Plan  Access Code: [email or web address], added parial lunges, sit to stands, step ups fwd, lat, retro, also added LTR, SKTC, pelvic tilts, bridges, hip flexor stretch, HSS    Consulted and Agree with Plan of Care  Patient       Patient will benefit from skilled therapeutic intervention in order to improve the following deficits and impairments:  Abnormal gait, Decreased activity tolerance, Decreased balance, Decreased endurance, Decreased strength, Difficulty walking  Visit Diagnosis: Unsteadiness on feet  Muscle weakness (generalized)  Chronic bilateral low back pain without sciatica  Hemiplegia and hemiparesis following nontraumatic intracerebral hemorrhage affecting left non-dominant side [facility])     Problem List Patient Active Problem List   Diagnosis Date Noted  . Calculus of kidney 03/12/2018  . Bleeding internal hemorrhoids 10/09/2017  . Sleep apnea 09/22/2017  . History of adenomatous polyp of colon 09/18/2017  . Prostate cancer ([place]) 09/18/2017  . Essential hypertension 08/26/2017  . Hyperlipidemia 08/26/2017  . Cerebral venous thrombosis 07/13/2017  . Intracerebral hemorrhage 07/10/2017  . ICH (intracerebral hemorrhage) ([place]) 07/10/2017  . Unstable angina ([place]) 03/12/2013    Silvestre Mesi 06/25/2019, 3:04 PM  [facility] [address] [place], [place], [address] Phone: [phone]   Fax:  [phone]  Name: Justin Adams MRN: [id number] Date of Birth: 09-11-1945

## 2019-06-29 ENCOUNTER — Other Ambulatory Visit: Payer: Self-pay

## 2019-06-29 ENCOUNTER — Ambulatory Visit (INDEPENDENT_AMBULATORY_CARE_PROVIDER_SITE_OTHER): Payer: Medicare Other | Admitting: Physical Therapy

## 2019-06-29 DIAGNOSIS — M545 Low back pain, unspecified: Secondary | ICD-10-CM

## 2019-06-29 DIAGNOSIS — M6281 Muscle weakness (generalized): Secondary | ICD-10-CM | POA: Diagnosis not present

## 2019-06-29 DIAGNOSIS — G8929 Other chronic pain: Secondary | ICD-10-CM | POA: Diagnosis not present

## 2019-06-29 DIAGNOSIS — R2681 Unsteadiness on feet: Secondary | ICD-10-CM | POA: Diagnosis not present

## 2019-06-29 DIAGNOSIS — I69154 Hemiplegia and hemiparesis following nontraumatic intracerebral hemorrhage affecting left non-dominant side: Secondary | ICD-10-CM | POA: Diagnosis not present

## 2019-06-29 NOTE — Therapy (Signed)
[place], [place], [address] Phone: [phone]   Fax:  [phone]  Physical Therapy Treatment  Patient Details  Name: Justin Adams MRN: [id number] Date of Birth: Mar 29, 1946 Referring Provider (PT): [name], MD   Encounter Date: 06/29/2019  PT End of Session - 06/29/19 0846    Visit Number  18    Number of Visits  24    Date for PT Re-Evaluation  01/74/94   recert performed on 49/6 to extend POC   Authorization Type  MCR AARP    PT Start Time  0802    PT Stop Time  0900    PT Time Calculation (min)  58 min    Activity Tolerance  Patient tolerated treatment well    Behavior During Therapy  [facility] for tasks assessed/performed       Past Medical History:  Diagnosis Date  . Adenomatous polyp   . Bleeding internal hemorrhoids 10/2017  . Cerebral venous thrombosis   . Grade I diastolic dysfunction 75/91/6384   noted on ECHO   . Hemorrhoids   . History of kidney stones   . Hypercholesteremia   . Hypertension   . Intracranial hemorrhage (HCC)    Right temporal lobe  . LVH (left ventricular hypertrophy) 07/11/2017   Mild, noted on ECHO   . Muscular degeneration   . Nephrolithiasis   . Obesity   . OSA (obstructive sleep apnea)   . Polycythemia   . Prostate cancer ([place]) 2003  . Shingles   . Stroke ([place]) 06/2017   No residual  . Unsteady gait   . Ureteral calculi 02/12/2008   Left    Past Surgical History:  Procedure Laterality Date  . APPENDECTOMY     childhood  . CHOLECYSTECTOMY    . COLONOSCOPY    . HEMORRHOID BANDING  2015  . HOLMIUM LASER APPLICATION Left 66/10/9933   Procedure: HOLMIUM LASER APPLICATION;  Surgeon: Franchot Gallo, MD;  [facility];  Service: Urology;  Laterality: Left;  . IR ANGIO EXTERNAL CAROTID SEL EXT CAROTID UNI R MOD SED  07/10/2017  . IR ANGIO INTRA EXTRACRAN SEL INTERNAL CAROTID BILAT MOD SED  07/10/2017  . IR ANGIO VERTEBRAL SEL VERTEBRAL BILAT MOD SED   07/10/2017  . IR URETERAL STENT LEFT NEW ACCESS W/O SEP NEPHROSTOMY CATH  03/12/2018  . LEFT HEART CATHETERIZATION WITH CORONARY ANGIOGRAM N/A 03/15/2013   Procedure: LEFT HEART CATHETERIZATION WITH CORONARY ANGIOGRAM;  Surgeon: Minus Breeding, MD;  Location: [facility] CATH LAB;  Service: Cardiovascular;  Laterality: N/A;  . NEPHROLITHOTOMY Left 03/12/2018   Procedure: LEFT NEPHROLITHOTOMY PERCUTANEOUS, STENT PLACEMENT;  Surgeon: Franchot Gallo, MD;  Location[facility];  Service: Urology;  Laterality: Left;  . PROSTATECTOMY    . STONE EXTRACTION WITH BASKET      There were no vitals filed for this visit.  Subjective Assessment - 06/29/19 0849    Subjective  feeling good today no pain    Pertinent History  PMH: HTN, CVA 06/2017, prostate Ca 2003,    Patient Stated Goals  improve gait and conditioning    Pain Onset  More than a month ago                       [facility] Adult PT Treatment/Exercise - 06/29/19 0001      High Level Balance   High Level Balance Comments  lateral walk on foam beam, front walk on foam beam in bars without UE support up/down  X 3 reps ea.      Therapeutic Activites    Therapeutic Activities  Other Therapeutic Activities      Lumbar Exercises: Stretches   Other Lumbar Stretch Exercise  standing L stretch 10 sec X 10 reps      Lumbar Exercises: Aerobic   Nustep  recumbant bike legs at UBE station 7 min L4 for endurance      Lumbar Exercises: Machines for Strengthening   Leg Press  Shuttle leg press 100 lbs 2X20 reps      Lumbar Exercises: Standing   Row Limitations  bent over rows unilat 15 lbs X 20 reps each side    Other Standing Lumbar Exercises  fwd and lateral step ups onto 6 inch step no UE support X 15 ea bilat    Other Standing Lumbar Exercises  farmers carry 15 lb KB two lap down and back in gym for each side      Lumbar Exercises: Seated   Sit to Stand Limitations  UE push up from knees with airex pad in chair 2X5      Traction   Type of  Traction  Lumbar    Min (lbs)  80    Max (lbs)  95    Time  15 min total preset program                  PT Long Term Goals - 05/12/19 0911      PT LONG TERM GOAL #1   Title  Pt will report performing HEP, walking program and transition to gym at end of PT    Baseline  compliant with old HEP but progressed HEP today due to progress    Time  6    Period  Weeks    Status  On-going      PT LONG TERM GOAL #2   Title  Pt will increaese overall leg strength to at least 4+ to 5- overall to improve function    Baseline  Met 05/12/19    Time  6    Period  Weeks    Status  Achieved      PT LONG TERM GOAL #3   Title  Will improve BERG to at least 53 to show improved balance    Baseline  now 54 on 12/2    Time  6    Period  Weeks    Status  Achieved      PT LONG TERM GOAL #4   Title  Will improve FGA to at least 29 to lower falls risk    Baseline  27    Time  6    Period  Weeks    Status  On-going      PT LONG TERM GOAL #5   Title  Will improve 6 minute walk test to 1560 ft    Baseline  will test  next session    Time  6    Period  Weeks    Status  On-going            Plan - 06/29/19 0846    Clinical Impression Statement  He had no soreness after last session so progressed his general strength and conditioning progrom with good toelance. He is slowly getting stronger with PT and is able to walk more and more frequent. Continue POC    Personal Factors and Comorbidities  Comorbidity 1;Comorbidity 2    Comorbidities  PMH: HTN, CVA 06/2017, prostate Ca 2003,  Examination-Activity Limitations  Bend;Squat;Stairs;Stand;Lift;Locomotion Level    Examination-Participation Restrictions  Community Activity;Shop;Yard Work    [employer]chant [employer] officer  Evolving/Moderate complexity    Rehab Potential  Good    PT Frequency  2x / week    PT Duration  6 weeks    PT Treatment/Interventions  Aquatic Therapy;Cryotherapy;Electrical Stimulation;Moist Heat;Gait  training;Stair training;Functional mobility training;Therapeutic activities;Therapeutic exercise;Balance training;Neuromuscular re-education;Manual techniques;Passive range of motion;Taping;Joint Manipulations    PT Next Visit Plan  lumbar stretch and strength, needs gait, balance, LE strength and conditioning    PT Home Exercise Plan  Access Code: [email or web address], added parial lunges, sit to stands, step ups fwd, lat, retro, also added LTR, SKTC, pelvic tilts, bridges, hip flexor stretch, HSS    Consulted and Agree with Plan of Care  Patient       Patient will benefit from skilled therapeutic intervention in order to improve the following deficits and impairments:  Abnormal gait, Decreased activity tolerance, Decreased balance, Decreased endurance, Decreased strength, Difficulty walking  Visit Diagnosis: Unsteadiness on feet  Muscle weakness (generalized)  Chronic bilateral low back pain without sciatica  Hemiplegia and hemiparesis following nontraumatic intracerebral hemorrhage affecting left non-dominant side [facility])     Problem List Patient Active Problem List   Diagnosis Date Noted  . Calculus of kidney 03/12/2018  . Bleeding internal hemorrhoids 10/09/2017  . Sleep apnea 09/22/2017  . History of adenomatous polyp of colon 09/18/2017  . Prostate cancer ([place]) 09/18/2017  . Essential hypertension 08/26/2017  . Hyperlipidemia 08/26/2017  . Cerebral venous thrombosis 07/13/2017  . Intracerebral hemorrhage 07/10/2017  . ICH (intracerebral hemorrhage) ([place]) 07/10/2017  . Unstable angina [facility]) 03/12/2013    Silvestre Mesi 06/29/2019, 8:50 AM  [facility] [address] [place], [place], [address] Phone: [phone]   Fax:  [phone]  Name: Justin Adams MRN: [id number] Date of Birth: 1945-11-13

## 2019-07-01 ENCOUNTER — Encounter: Payer: Medicare Other | Admitting: Physical Therapy

## 2019-07-06 ENCOUNTER — Ambulatory Visit (INDEPENDENT_AMBULATORY_CARE_PROVIDER_SITE_OTHER): Payer: Medicare Other | Admitting: Physical Therapy

## 2019-07-06 ENCOUNTER — Ambulatory Visit: Payer: Medicare Other

## 2019-07-06 ENCOUNTER — Other Ambulatory Visit: Payer: Self-pay

## 2019-07-06 DIAGNOSIS — R2681 Unsteadiness on feet: Secondary | ICD-10-CM | POA: Diagnosis not present

## 2019-07-06 DIAGNOSIS — G8929 Other chronic pain: Secondary | ICD-10-CM | POA: Diagnosis not present

## 2019-07-06 DIAGNOSIS — M545 Low back pain, unspecified: Secondary | ICD-10-CM

## 2019-07-06 DIAGNOSIS — I69154 Hemiplegia and hemiparesis following nontraumatic intracerebral hemorrhage affecting left non-dominant side: Secondary | ICD-10-CM

## 2019-07-06 DIAGNOSIS — M6281 Muscle weakness (generalized): Secondary | ICD-10-CM | POA: Diagnosis not present

## 2019-07-06 NOTE — Therapy (Signed)
[facility] [address] [place], [place], [address] Phone: [phone]   Fax:  [phone]  Physical Therapy Treatment  Patient Details  Name: Justin Adams MRN: [id number] Date of Birth: [birth year] Referring Provider (PT): [name], MD   Encounter Date: 07/06/2019  PT End of Session - 07/06/19 0840    Visit Number  19    Number of Visits  24    Date for PT Re-Evaluation  83/38/25   recert performed on 05/3 to extend POC   Authorization Type  MCR AARP    PT Start Time  0800    PT Stop Time  0850    PT Time Calculation (min)  50 min    Activity Tolerance  Patient tolerated treatment well    Behavior During Therapy  [facility] for tasks assessed/performed       Past Medical History:  Diagnosis Date  . Adenomatous polyp   . Bleeding internal hemorrhoids 10/2017  . Cerebral venous thrombosis   . Grade I diastolic dysfunction 97/67/3419   noted on ECHO   . Hemorrhoids   . History of kidney stones   . Hypercholesteremia   . Hypertension   . Intracranial hemorrhage (HCC)    Right temporal lobe  . LVH (left ventricular hypertrophy) 07/11/2017   Mild, noted on ECHO   . Muscular degeneration   . Nephrolithiasis   . Obesity   . OSA (obstructive sleep apnea)   . Polycythemia   . Prostate cancer ([place]) 2003  . Shingles   . Stroke ([place]) 06/2017   No residual  . Unsteady gait   . Ureteral calculi 02/12/2008   Left    Past Surgical History:  Procedure Laterality Date  . APPENDECTOMY     childhood  . CHOLECYSTECTOMY    . COLONOSCOPY    . HEMORRHOID BANDING  2015  . HOLMIUM LASER APPLICATION Left 37/02/239   Procedure: HOLMIUM LASER APPLICATION;  Surgeon: Franchot Gallo, MD;  [facility];  Service: Urology;  Laterality: Left;  . IR ANGIO EXTERNAL CAROTID SEL EXT CAROTID UNI R MOD SED  07/10/2017  . IR ANGIO INTRA EXTRACRAN SEL INTERNAL CAROTID BILAT MOD SED  07/10/2017  . IR ANGIO VERTEBRAL SEL VERTEBRAL BILAT MOD SED   07/10/2017  . IR URETERAL STENT LEFT NEW ACCESS W/O SEP NEPHROSTOMY CATH  03/12/2018  . LEFT HEART CATHETERIZATION WITH CORONARY ANGIOGRAM N/A 03/15/2013   Procedure: LEFT HEART CATHETERIZATION WITH CORONARY ANGIOGRAM;  Surgeon: Minus Breeding, MD;  Location: [facility] CATH LAB;  Service: Cardiovascular;  Laterality: N/A;  . NEPHROLITHOTOMY Left 03/12/2018   Procedure: LEFT NEPHROLITHOTOMY PERCUTANEOUS, STENT PLACEMENT;  Surgeon: Franchot Gallo, MD;  [facility];  Service: Urology;  Laterality: Left;  . PROSTATECTOMY    . STONE EXTRACTION WITH BASKET      There were no vitals filed for this visit.  Subjective Assessment - 07/06/19 0834    Subjective  Relays no new complaints today, his back is feeling good, feels like he is getting stronger, some soreness in his back after rows but its better now    Pertinent History  PMH: HTN, CVA 06/2017, prostate Ca 2003,    Patient Stated Goals  improve gait and conditioning    Currently in Pain?  No/denies    Pain Onset  More than a month ago         [facility] PT Assessment - 07/06/19 0001      Assessment   Medical Diagnosis  Unsteady Gait, LBP  Referring Provider (PT)  [name], MD      Strength   Overall Strength Comments  knee strength overall 5/5, hip strength overall 5-/5 MMT grossly tested in sitting         [facility] Adult PT Treatment/Exercise - 07/06/19 0001      High Level Balance   High Level Balance Comments  at wall, march walking fwd, retro, braided walking, tandem walk, walking with eyes closed with supervision but did not any assistance to maintain balance      Lumbar Exercises: Stretches   Other Lumbar Stretch Exercise  standing L stretch 10 sec X 10 reps      Lumbar Exercises: Aerobic   Recumbent Bike  8 min L2      Lumbar Exercises: Machines for Strengthening   Leg Press  Shuttle leg press 106 lbs 2X15 reps      Lumbar Exercises: Standing   Row Limitations  bent over rows 15 lbs X 10 reps bilat    Other Standing  Lumbar Exercises  funcitonal squat at sink X 10 reps with cues to sit back toward chair behind him    Other Standing Lumbar Exercises  farmers carry 15 lb KB two lap down and back in gym for each side      Lumbar Exercises: Seated   Sit to Stand Limitations  UE push up from knees with airex pad in chair 2X5      Traction   Type of Traction  Lumbar    Min (lbs)  85    Max (lbs)  95    Time  15 min total preset program                  PT Long Term Goals - 05/12/19 0911      PT LONG TERM GOAL #1   Title  Pt will report performing HEP, walking program and transition to gym at end of PT    Baseline  compliant with old HEP but progressed HEP today due to progress    Time  6    Period  Weeks    Status  On-going      PT LONG TERM GOAL #2   Title  Pt will increaese overall leg strength to at least 4+ to 5- overall to improve function    Baseline  Met 05/12/19    Time  6    Period  Weeks    Status  Achieved      PT LONG TERM GOAL #3   Title  Will improve BERG to at least 53 to show improved balance    Baseline  now 54 on 12/2    Time  6    Period  Weeks    Status  Achieved      PT LONG TERM GOAL #4   Title  Will improve FGA to at least 29 to lower falls risk    Baseline  27    Time  6    Period  Weeks    Status  On-going      PT LONG TERM GOAL #5   Title  Will improve 6 minute walk test to 1560 ft    Baseline  will test  next session    Time  6    Period  Weeks    Status  On-going            Plan - 07/06/19 0841    Clinical Impression Statement  He had some back  soreness but did not last more than a day so continued with strengthening program without complaints during session. Continue with traction as this appears to be managing his overall pain. He will need recert and MCR progress note next visit. Leg strength has improved with retesting today.    Personal Factors and Comorbidities  Comorbidity 1;Comorbidity 2    Comorbidities  PMH: HTN, CVA 06/2017,  prostate Ca 2003,    Examination-Activity Limitations  Bend;Squat;Stairs;Stand;Lift;Locomotion Level    Examination-Participation Restrictions  Community Activity;Shop;Yard Work    [employer]chant [employer] officer  Evolving/Moderate complexity    Rehab Potential  Good    PT Frequency  2x / week    PT Duration  6 weeks    PT Treatment/Interventions  Aquatic Therapy;Cryotherapy;Electrical Stimulation;Moist Heat;Gait training;Stair training;Functional mobility training;Therapeutic activities;Therapeutic exercise;Balance training;Neuromuscular re-education;Manual techniques;Passive range of motion;Taping;Joint Manipulations    PT Next Visit Plan  lumbar stretch and strength, needs gait, balance, LE strength and conditioning    PT Home Exercise Plan  Access Code: [email or web address], added parial lunges, sit to stands, step ups fwd, lat, retro, also added LTR, SKTC, pelvic tilts, bridges, hip flexor stretch, HSS    Consulted and Agree with Plan of Care  Patient       Patient will benefit from skilled therapeutic intervention in order to improve the following deficits and impairments:  Abnormal gait, Decreased activity tolerance, Decreased balance, Decreased endurance, Decreased strength, Difficulty walking  Visit Diagnosis: Unsteadiness on feet  Muscle weakness (generalized)  Chronic bilateral low back pain without sciatica  Hemiplegia and hemiparesis following nontraumatic intracerebral hemorrhage affecting left non-dominant side [facility])     Problem List Patient Active Problem List   Diagnosis Date Noted  . Calculus of kidney 03/12/2018  . Bleeding internal hemorrhoids 10/09/2017  . Sleep apnea 09/22/2017  . History of adenomatous polyp of colon 09/18/2017  . Prostate cancer ([place]) 09/18/2017  . Essential hypertension 08/26/2017  . Hyperlipidemia 08/26/2017  . Cerebral venous thrombosis 07/13/2017  . Intracerebral hemorrhage 07/10/2017  . ICH (intracerebral hemorrhage) ([place]) 07/10/2017  .  Unstable angina ([place]) 03/12/2013    Debbe Odea, PT,DPT 07/06/2019, 8:45 AM  [facility] Physical Therapy [address] [place], [place], [address] Phone: [phone]   Fax:  [phone]  Name: Justin Adams MRN: [id number] Date of Birth: [birth year]

## 2019-07-08 ENCOUNTER — Other Ambulatory Visit: Payer: Self-pay

## 2019-07-08 ENCOUNTER — Ambulatory Visit (INDEPENDENT_AMBULATORY_CARE_PROVIDER_SITE_OTHER): Payer: Medicare Other | Admitting: Physical Therapy

## 2019-07-08 ENCOUNTER — Encounter: Payer: Self-pay | Admitting: Physical Therapy

## 2019-07-08 DIAGNOSIS — M6281 Muscle weakness (generalized): Secondary | ICD-10-CM | POA: Diagnosis not present

## 2019-07-08 DIAGNOSIS — R2681 Unsteadiness on feet: Secondary | ICD-10-CM

## 2019-07-08 DIAGNOSIS — G8929 Other chronic pain: Secondary | ICD-10-CM

## 2019-07-08 DIAGNOSIS — M545 Low back pain: Secondary | ICD-10-CM

## 2019-07-08 NOTE — Therapy (Signed)
[facility] Therapy [address] [place], [place], [address] Phone: [phone]   Fax:  [phone]  Physical Therapy Treatment/MCR progress note Progress Note reporting period 05/05/19 to 07/08/19  See below for objective and subjective measurements relating to patients progress with PT.   Patient Details  Name: Justin Adams MRN: [id number] Date of Birth: 1946/04/24 Referring Provider (PT): [name], MD   Encounter Date: 07/08/2019  PT End of Session - 07/08/19 1138    Visit Number  20    Number of Visits  36    Date for PT Re-Evaluation  04/03/84   recert performed on 27/7 to extend POC   Authorization Type  MCR AARP    PT Start Time  0800    PT Stop Time  0855    PT Time Calculation (min)  55 min    Activity Tolerance  Patient tolerated treatment well    Behavior During Therapy  [facility] for tasks assessed/performed       Past Medical History:  Diagnosis Date  . Adenomatous polyp   . Bleeding internal hemorrhoids 10/2017  . Cerebral venous thrombosis   . Grade I diastolic dysfunction 82/42/3536   noted on ECHO   . Hemorrhoids   . History of kidney stones   . Hypercholesteremia   . Hypertension   . Intracranial hemorrhage (HCC)    Right temporal lobe  . LVH (left ventricular hypertrophy) 07/11/2017   Mild, noted on ECHO   . Muscular degeneration   . Nephrolithiasis   . Obesity   . OSA (obstructive sleep apnea)   . Polycythemia   . Prostate cancer ([place]) 2003  . Shingles   . Stroke ([place]) 06/2017   No residual  . Unsteady gait   . Ureteral calculi 02/12/2008   Left    Past Surgical History:  Procedure Laterality Date  . APPENDECTOMY     childhood  . CHOLECYSTECTOMY    . COLONOSCOPY    . HEMORRHOID BANDING  2015  . HOLMIUM LASER APPLICATION Left 14/09/3152   Procedure: HOLMIUM LASER APPLICATION;  Surgeon: Franchot Gallo, MD;  [facility];  Service: Urology;  Laterality: Left;  . IR ANGIO EXTERNAL CAROTID  SEL EXT CAROTID UNI R MOD SED  07/10/2017  . IR ANGIO INTRA EXTRACRAN SEL INTERNAL CAROTID BILAT MOD SED  07/10/2017  . IR ANGIO VERTEBRAL SEL VERTEBRAL BILAT MOD SED  07/10/2017  . IR URETERAL STENT LEFT NEW ACCESS W/O SEP NEPHROSTOMY CATH  03/12/2018  . LEFT HEART CATHETERIZATION WITH CORONARY ANGIOGRAM N/A 03/15/2013   Procedure: LEFT HEART CATHETERIZATION WITH CORONARY ANGIOGRAM;  Surgeon: Minus Breeding, MD;  Location: [facility] CATH LAB;  Service: Cardiovascular;  Laterality: N/A;  . NEPHROLITHOTOMY Left 03/12/2018   Procedure: LEFT NEPHROLITHOTOMY PERCUTANEOUS, STENT PLACEMENT;  Surgeon: Franchot Gallo, MD;  [facility];  Service: Urology;  Laterality: Left;  . PROSTATECTOMY    . STONE EXTRACTION WITH BASKET      There were no vitals filed for this visit.  Subjective Assessment - 07/08/19 1131    Subjective  no pain upon arrival, he feels like his balance is much improved    Pertinent History  PMH: HTN, CVA 06/2017, prostate Ca 2003,    Patient Stated Goals  improve gait and conditioning    Currently in Pain?  No/denies    Pain Onset  More than a month ago         [facility] PT Assessment - 07/08/19 0001      Assessment  Medical Diagnosis  Unsteady Gait, LBP    Referring Provider (PT)  Stoneking, Hal, MD      Strength   Overall Strength Comments  knee strength overall 5/5, hip strength overall 5-/5 MMT grossly tested in sitting      6 minute walk test results    Endurance additional comments  .27 miles, SPO2 97%, HR 99 post       Standardized Balance Assessment   Standardized Balance Assessment  Dynamic Gait Index      Dynamic Gait Index   Level Surface  Normal    Change in Gait Speed  Normal    Gait with Horizontal Head Turns  Normal    Gait with Vertical Head Turns  Normal    Gait and Pivot Turn  Normal    Step Over Obstacle  Normal    Step Around Obstacles  Mild Impairment    Steps  Normal    Total Score  23                   [facility] Adult PT  Treatment/Exercise - 07/08/19 0001      Lumbar Exercises: Stretches   Other Lumbar Stretch Exercise  LTR X 10 reps bilat, SKTC stretch 30 sec X 2 bilat, sitting lumbar flexion P ball roll outs 10 sec X 10 reps      Lumbar Exercises: Aerobic   Tread Mill  6 minute walk test .27 miles      Lumbar Exercises: Machines for Strengthening   Leg Press  Shuttle leg press 106 lbs 2X15 reps      Lumbar Exercises: Standing   Row Limitations  bent over row 12 lbs X 15 both sides      Lumbar Exercises: Seated   Sit to Stand Limitations  UE push up from knees with airex pad in chair 2X5      Traction   Type of Traction  Lumbar    Min (lbs)  85    Max (lbs)  95    Time  15 min total preset program                  PT Long Term Goals - 07/08/19 1140      PT LONG TERM GOAL #1   Title  Pt will report performing HEP, walking program and transition to gym at end of PT    Baseline  compliant with old HEP but progressed HEP today due to progress    Time  6    Period  Weeks    Status  Achieved      PT LONG TERM GOAL #2   Title  Pt will increaese overall leg strength to at least 4+ to 5- overall to improve function    Baseline  Met 05/12/19    Time  6    Period  Weeks    Status  Achieved      PT LONG TERM GOAL #3   Title  Will improve BERG to at least 53 to show improved balance    Baseline  now 54 on 12/2    Time  6    Period  Weeks    Status  Achieved      PT LONG TERM GOAL #4   Title  Will improve FGA to at least 29 to lower falls risk    Baseline  27    Time  6    Period  Weeks    Status  On-going  PT LONG TERM GOAL #5   Title  Will improve 6 minute walk test to 1560 ft    Baseline  .27 miles    Time  6    Period  Weeks    Status  On-going            Plan - 07/08/19 1139    Clinical Impression Statement  Recert today and MCR progress note today. He has attended 20 PT visits and continues to make progress with PT. He has overall improved his leg strength,  his general endurance, his overall back pain and his balance. He had improved balance scores today and feels he is improving with PT and wishes to continue. PT recommending extending PT for another 6 weeks to address his remaining deficits in endurance and conditioning. He has met 3/5 PT goals thus far.    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    Rehab Potential  Good    PT Frequency  2x / week    PT Duration  6 weeks    PT Treatment/Interventions  Aquatic Therapy;Cryotherapy;Electrical Stimulation;Moist Heat;Gait training;Stair training;Functional mobility training;Therapeutic activities;Therapeutic exercise;Balance training;Neuromuscular re-education;Manual techniques;Passive range of motion;Taping;Joint Manipulations    PT Next Visit Plan  lumbar stretch and strength, needs gait, balance, LE strength and conditioning    PT Home Exercise Plan  Access Code: [email or web address], added parial lunges, sit to stands, step ups fwd, lat, retro, also added LTR, SKTC, pelvic tilts, bridges, hip flexor stretch, HSS    Consulted and Agree with Plan of Care  Patient       Patient will benefit from skilled therapeutic intervention in order to improve the following deficits and impairments:  Abnormal gait, Decreased activity tolerance, Decreased balance, Decreased endurance, Decreased strength, Difficulty walking  Visit Diagnosis: Unsteadiness on feet  Muscle weakness (generalized)  Chronic bilateral low back pain without sciatica     Problem List Patient Active Problem List   Diagnosis Date Noted  . Calculus of kidney 03/12/2018  . Bleeding internal hemorrhoids 10/09/2017  . Sleep apnea 09/22/2017   . History of adenomatous polyp of colon 09/18/2017  . Prostate cancer ([place]) 09/18/2017  . Essential hypertension 08/26/2017  . Hyperlipidemia 08/26/2017  . Cerebral venous thrombosis 07/13/2017  . Intracerebral hemorrhage 07/10/2017  . ICH (intracerebral hemorrhage) ([place]) 07/10/2017  . Unstable angina ([place]) 03/12/2013    Debbe Odea ,PT,DPT 07/08/2019, 11:46 AM  [facility] Physical Therapy [address] [place], [place], [address] Phone: [phone]   Fax:  [phone]  Name: Justin Adams MRN: [id number] Date of Birth: June 01, 1946

## 2019-07-08 NOTE — Addendum Note (Signed)
Addended by: Debbe Odea on: 07/08/2019 11:54 AM   Modules accepted: Orders

## 2019-07-12 DIAGNOSIS — H353132 Nonexudative age-related macular degeneration, bilateral, intermediate dry stage: Secondary | ICD-10-CM | POA: Diagnosis not present

## 2019-07-12 DIAGNOSIS — H43812 Vitreous degeneration, left eye: Secondary | ICD-10-CM | POA: Diagnosis not present

## 2019-07-12 DIAGNOSIS — H33312 Horseshoe tear of retina without detachment, left eye: Secondary | ICD-10-CM | POA: Diagnosis not present

## 2019-07-12 DIAGNOSIS — H353221 Exudative age-related macular degeneration, left eye, with active choroidal neovascularization: Secondary | ICD-10-CM | POA: Diagnosis not present

## 2019-07-13 ENCOUNTER — Other Ambulatory Visit: Payer: Self-pay

## 2019-07-13 ENCOUNTER — Ambulatory Visit (INDEPENDENT_AMBULATORY_CARE_PROVIDER_SITE_OTHER): Payer: Medicare Other | Admitting: Physical Therapy

## 2019-07-13 DIAGNOSIS — G8929 Other chronic pain: Secondary | ICD-10-CM | POA: Diagnosis not present

## 2019-07-13 DIAGNOSIS — R2681 Unsteadiness on feet: Secondary | ICD-10-CM

## 2019-07-13 DIAGNOSIS — M6281 Muscle weakness (generalized): Secondary | ICD-10-CM

## 2019-07-13 DIAGNOSIS — I69154 Hemiplegia and hemiparesis following nontraumatic intracerebral hemorrhage affecting left non-dominant side: Secondary | ICD-10-CM

## 2019-07-13 DIAGNOSIS — M545 Low back pain: Secondary | ICD-10-CM | POA: Diagnosis not present

## 2019-07-13 NOTE — Therapy (Signed)
[place], [place], [address] Phone: [phone]   Fax:  [phone]  Physical Therapy Treatment  Patient Details  Name: Justin Adams MRN: [id number] Date of Birth: 1946/01/20 Referring Provider (PT): [name], MD   Encounter Date: 07/13/2019  PT End of Session - 07/13/19 0942    Visit Number  21    Number of Visits  36    Date for PT Re-Evaluation  46/28/63   recert performed on 81/7 to extend POC   Authorization Type  MCR AARP    PT Start Time  0800    PT Stop Time  0855    PT Time Calculation (min)  55 min    Activity Tolerance  Patient tolerated treatment well    Behavior During Therapy  [facility] for tasks assessed/performed       Past Medical History:  Diagnosis Date  . Adenomatous polyp   . Bleeding internal hemorrhoids 10/2017  . Cerebral venous thrombosis   . Grade I diastolic dysfunction 71/16/5790   noted on ECHO   . Hemorrhoids   . History of kidney stones   . Hypercholesteremia   . Hypertension   . Intracranial hemorrhage (HCC)    Right temporal lobe  . LVH (left ventricular hypertrophy) 07/11/2017   Mild, noted on ECHO   . Muscular degeneration   . Nephrolithiasis   . Obesity   . OSA (obstructive sleep apnea)   . Polycythemia   . Prostate cancer ([place]) 2003  . Shingles   . Stroke ([place]) 06/2017   No residual  . Unsteady gait   . Ureteral calculi 02/12/2008   Left    Past Surgical History:  Procedure Laterality Date  . APPENDECTOMY     childhood  . CHOLECYSTECTOMY    . COLONOSCOPY    . HEMORRHOID BANDING  2015  . HOLMIUM LASER APPLICATION Left 38/08/3381   Procedure: HOLMIUM LASER APPLICATION;  Surgeon: Franchot Gallo, MD;  [facility];  Service: Urology;  Laterality: Left;  . IR ANGIO EXTERNAL CAROTID SEL EXT CAROTID UNI R MOD SED  07/10/2017  . IR ANGIO INTRA EXTRACRAN SEL INTERNAL CAROTID BILAT MOD SED  07/10/2017  . IR ANGIO VERTEBRAL SEL VERTEBRAL BILAT MOD SED   07/10/2017  . IR URETERAL STENT LEFT NEW ACCESS W/O SEP NEPHROSTOMY CATH  03/12/2018  . LEFT HEART CATHETERIZATION WITH CORONARY ANGIOGRAM N/A 03/15/2013   Procedure: LEFT HEART CATHETERIZATION WITH CORONARY ANGIOGRAM;  Surgeon: Minus Breeding, MD;  Location: [facility] CATH LAB;  Service: Cardiovascular;  Laterality: N/A;  . NEPHROLITHOTOMY Left 03/12/2018   Procedure: LEFT NEPHROLITHOTOMY PERCUTANEOUS, STENT PLACEMENT;  Surgeon: Franchot Gallo, MD;  [facility];  Service: Urology;  Laterality: Left;  . PROSTATECTOMY    . STONE EXTRACTION WITH BASKET      There were no vitals filed for this visit.  Subjective Assessment - 07/13/19 0812    Subjective  He is feeling overall good today, no complaints of pain    Pertinent History  PMH: HTN, CVA 06/2017, prostate Ca 2003,    Patient Stated Goals  improve gait and conditioning    Currently in Pain?  No/denies    Pain Onset  More than a month ago                       [facility] Adult PT Treatment/Exercise - 07/13/19 0001      Lumbar Exercises: Stretches   Other Lumbar Stretch Exercise  LTR X 10  reps bilat, SKTC stretch 30 sec X 2 bilat, sitting lumbar flexion P ball roll outs 10 sec X 10 reps      Lumbar Exercises: Aerobic   Tread Mill  7 minutes 2.6 mph      Lumbar Exercises: Machines for Strengthening   Leg Press  Shuttle leg press 106 lbs 2X15 reps      Lumbar Exercises: Standing   Row Limitations  bent over unilateral rows 12 lbs X 15 both sides    Other Standing Lumbar Exercises  funcitonal squat at sink X 10 reps with cues to sit back toward chair behind him    Other Standing Lumbar Exercises  farmers carry 15 lb KB two lap down and back in gym for each side      Lumbar Exercises: Seated   Sit to Stand Limitations  UE push up from knees with airex pad in chair 2X5      Traction   Type of Traction  Lumbar    Min (lbs)  85    Max (lbs)  95    Time  15 min total preset program                  PT Long  Term Goals - 07/08/19 1140      PT LONG TERM GOAL #1   Title  Pt will report performing HEP, walking program and transition to gym at end of PT    Baseline  compliant with old HEP but progressed HEP today due to progress    Time  6    Period  Weeks    Status  Achieved      PT LONG TERM GOAL #2   Title  Pt will increaese overall leg strength to at least 4+ to 5- overall to improve function    Baseline  Met 05/12/19    Time  6    Period  Weeks    Status  Achieved      PT LONG TERM GOAL #3   Title  Will improve BERG to at least 53 to show improved balance    Baseline  now 54 on 12/2    Time  6    Period  Weeks    Status  Achieved      PT LONG TERM GOAL #4   Title  Will improve FGA to at least 29 to lower falls risk    Baseline  27    Time  6    Period  Weeks    Status  On-going      PT LONG TERM GOAL #5   Title  Will improve 6 minute walk test to 1560 ft    Baseline  .27 miles    Time  6    Period  Weeks    Status  On-going            Plan - 07/13/19 6629    Clinical Impression Statement  Continued with general strength and conditioning program and he had good tolerance without complaints. Overall his pain and soreness has been better managed over the last month. PT will continue to progress as able.    Personal Factors and Comorbidities  Comorbidity 1;Comorbidity 2    Comorbidities  PMH: HTN, CVA 06/2017, prostate Ca 2003,    Examination-Activity Limitations  Bend;Squat;Stairs;Stand;Lift;Locomotion Level    Examination-Participation Restrictions  Community Activity;Shop;Yard Work    Journalist, newspaper    Rehab Potential  Good  PT Frequency  2x / week    PT Duration  6 weeks    PT Treatment/Interventions  Aquatic Therapy;Cryotherapy;Electrical Stimulation;Moist Heat;Gait training;Stair training;Functional mobility training;Therapeutic activities;Therapeutic exercise;Balance training;Neuromuscular re-education;Manual  techniques;Passive range of motion;Taping;Joint Manipulations    PT Next Visit Plan  lumbar stretch and strength, needs gait, balance, LE strength and conditioning    PT Home Exercise Plan  Access Code: [email or web address], added parial lunges, sit to stands, step ups fwd, lat, retro, also added LTR, SKTC, pelvic tilts, bridges, hip flexor stretch, HSS    Consulted and Agree with Plan of Care  Patient       Patient will benefit from skilled therapeutic intervention in order to improve the following deficits and impairments:  Abnormal gait, Decreased activity tolerance, Decreased balance, Decreased endurance, Decreased strength, Difficulty walking  Visit Diagnosis: Unsteadiness on feet  Muscle weakness (generalized)  Chronic bilateral low back pain without sciatica  Hemiplegia and hemiparesis following nontraumatic intracerebral hemorrhage affecting left non-dominant side [facility])     Problem List Patient Active Problem List   Diagnosis Date Noted  . Calculus of kidney 03/12/2018  . Bleeding internal hemorrhoids 10/09/2017  . Sleep apnea 09/22/2017  . History of adenomatous polyp of colon 09/18/2017  . Prostate cancer ([place]) 09/18/2017  . Essential hypertension 08/26/2017  . Hyperlipidemia 08/26/2017  . Cerebral venous thrombosis 07/13/2017  . Intracerebral hemorrhage 07/10/2017  . ICH (intracerebral hemorrhage) ([place]) 07/10/2017  . Unstable angina [facility]) 03/12/2013    Silvestre Mesi 07/13/2019, 9:44 AM  [facility] Physical Therapy [address] [place], [place], [address] Phone: [phone]   Fax:  [phone]  Name: ZYMIER RODGERS MRN: [id number] Date of Birth: 10-Feb-1946

## 2019-07-15 ENCOUNTER — Ambulatory Visit (INDEPENDENT_AMBULATORY_CARE_PROVIDER_SITE_OTHER): Payer: Medicare Other | Admitting: Physical Therapy

## 2019-07-15 ENCOUNTER — Other Ambulatory Visit: Payer: Self-pay

## 2019-07-15 ENCOUNTER — Encounter: Payer: Self-pay | Admitting: Physical Therapy

## 2019-07-15 DIAGNOSIS — R2681 Unsteadiness on feet: Secondary | ICD-10-CM | POA: Diagnosis not present

## 2019-07-15 DIAGNOSIS — I69154 Hemiplegia and hemiparesis following nontraumatic intracerebral hemorrhage affecting left non-dominant side: Secondary | ICD-10-CM

## 2019-07-15 DIAGNOSIS — M6281 Muscle weakness (generalized): Secondary | ICD-10-CM

## 2019-07-15 DIAGNOSIS — M545 Low back pain: Secondary | ICD-10-CM

## 2019-07-15 DIAGNOSIS — G8929 Other chronic pain: Secondary | ICD-10-CM

## 2019-07-15 NOTE — Therapy (Signed)
[place], [place], [address] Phone: [phone]   Fax:  [phone]  Physical Therapy Treatment  Patient Details  Name: Justin Adams MRN: [id number] Date of Birth: 1946/01/22 Referring Provider (PT): [name], MD   Encounter Date: 07/15/2019  PT End of Session - 07/15/19 0924    Visit Number  22    Number of Visits  36    Date for PT Re-Evaluation  84/16/60   recert performed on 63/0 to extend POC   Authorization Type  MCR AARP    PT Start Time  0800    PT Stop Time  0855    PT Time Calculation (min)  55 min    Activity Tolerance  Patient tolerated treatment well    Behavior During Therapy  [facility] for tasks assessed/performed       Past Medical History:  Diagnosis Date  . Adenomatous polyp   . Bleeding internal hemorrhoids 10/2017  . Cerebral venous thrombosis   . Grade I diastolic dysfunction 16/06/930   noted on ECHO   . Hemorrhoids   . History of kidney stones   . Hypercholesteremia   . Hypertension   . Intracranial hemorrhage (HCC)    Right temporal lobe  . LVH (left ventricular hypertrophy) 07/11/2017   Mild, noted on ECHO   . Muscular degeneration   . Nephrolithiasis   . Obesity   . OSA (obstructive sleep apnea)   . Polycythemia   . Prostate cancer ([place]) 2003  . Shingles   . Stroke ([place]) 06/2017   No residual  . Unsteady gait   . Ureteral calculi 02/12/2008   Left    Past Surgical History:  Procedure Laterality Date  . APPENDECTOMY     childhood  . CHOLECYSTECTOMY    . COLONOSCOPY    . HEMORRHOID BANDING  2015  . HOLMIUM LASER APPLICATION Left 35/10/7320   Procedure: HOLMIUM LASER APPLICATION;  Surgeon: Franchot Gallo, MD;  [facility];  Service: Urology;  Laterality: Left;  . IR ANGIO EXTERNAL CAROTID SEL EXT CAROTID UNI R MOD SED  07/10/2017  . IR ANGIO INTRA EXTRACRAN SEL INTERNAL CAROTID BILAT MOD SED  07/10/2017  . IR ANGIO VERTEBRAL SEL VERTEBRAL BILAT MOD SED   07/10/2017  . IR URETERAL STENT LEFT NEW ACCESS W/O SEP NEPHROSTOMY CATH  03/12/2018  . LEFT HEART CATHETERIZATION WITH CORONARY ANGIOGRAM N/A 03/15/2013   Procedure: LEFT HEART CATHETERIZATION WITH CORONARY ANGIOGRAM;  Surgeon: Minus Breeding, MD;  Location: [facility] CATH LAB;  Service: Cardiovascular;  Laterality: N/A;  . NEPHROLITHOTOMY Left 03/12/2018   Procedure: LEFT NEPHROLITHOTOMY PERCUTANEOUS, STENT PLACEMENT;  Surgeon: Franchot Gallo, MD;  [facility];  Service: Urology;  Laterality: Left;  . PROSTATECTOMY    . STONE EXTRACTION WITH BASKET      There were no vitals filed for this visit.  Subjective Assessment - 07/15/19 0921    Subjective  He is feeling overall good today, no complaints of pain    Pertinent History  PMH: HTN, CVA 06/2017, prostate Ca 2003,    Patient Stated Goals  improve gait and conditioning    Pain Onset  More than a month ago                       [facility] Adult PT Treatment/Exercise - 07/15/19 0001      Lumbar Exercises: Stretches   Other Lumbar Stretch Exercise  standing L stretch at counter 10 sec X 10, standing in  doorframe strething into upright posutre with reaching up      Lumbar Exercises: Aerobic   Tread Mill  7 minutes 2.6 mph      Lumbar Exercises: Machines for Strengthening   Leg Press  Shuttle leg press 112 lbs 2X15 reps      Lumbar Exercises: Standing   Other Standing Lumbar Exercises  bent over unilateral rows 15 lbs X 15 reps    Other Standing Lumbar Exercises  farmers carry 15 lb KB two lap down and back in gym for each side      Lumbar Exercises: Seated   Sit to Stand Limitations  UE push up from knees with airex pad in chair 2X5, attempted without airex pad but unable       Traction   Type of Traction  Lumbar    Min (lbs)  85    Max (lbs)  95    Time  15 min total preset program                  PT Long Term Goals - 07/08/19 1140      PT LONG TERM GOAL #1   Title  Pt will report performing HEP,  walking program and transition to gym at end of PT    Baseline  compliant with old HEP but progressed HEP today due to progress    Time  6    Period  Weeks    Status  Achieved      PT LONG TERM GOAL #2   Title  Pt will increaese overall leg strength to at least 4+ to 5- overall to improve function    Baseline  Met 05/12/19    Time  6    Period  Weeks    Status  Achieved      PT LONG TERM GOAL #3   Title  Will improve BERG to at least 53 to show improved balance    Baseline  now 54 on 12/2    Time  6    Period  Weeks    Status  Achieved      PT LONG TERM GOAL #4   Title  Will improve FGA to at least 29 to lower falls risk    Baseline  27    Time  6    Period  Weeks    Status  On-going      PT LONG TERM GOAL #5   Title  Will improve 6 minute walk test to 1560 ft    Baseline  .27 miles    Time  6    Period  Weeks    Status  On-going            Plan - 07/15/19 0539    Clinical Impression Statement  Added in more postural stretching today to offset some of his slumped posture. He had good toelrance to exercies. PT will continue to progres as tolerated.    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    Rehab Potential  Good    PT Frequency  2x / week    PT Duration  6 weeks    PT Treatment/Interventions  Aquatic Therapy;Cryotherapy;Electrical Stimulation;Moist Heat;Gait training;Stair training;Functional mobility training;Therapeutic activities;Therapeutic exercise;Balance training;Neuromuscular re-education;Manual techniques;Passive range of motion;Taping;Joint Manipulations  PT Next Visit Plan  lumbar stretch and strength, needs gait, balance, LE strength and  conditioning    PT Home Exercise Plan  Access Code: [email or web address], added parial lunges, sit to stands, step ups fwd, lat, retro, also added LTR, SKTC, pelvic tilts, bridges, hip flexor stretch, HSS    Consulted and Agree with Plan of Care  Patient       Patient will benefit from skilled therapeutic intervention in order to improve the following deficits and impairments:  Abnormal gait, Decreased activity tolerance, Decreased balance, Decreased endurance, Decreased strength, Difficulty walking  Visit Diagnosis: Unsteadiness on feet  Muscle weakness (generalized)  Chronic bilateral low back pain without sciatica  Hemiplegia and hemiparesis following nontraumatic intracerebral hemorrhage affecting left non-dominant side [facility])     Problem List Patient Active Problem List   Diagnosis Date Noted  . Calculus of kidney 03/12/2018  . Bleeding internal hemorrhoids 10/09/2017  . Sleep apnea 09/22/2017  . History of adenomatous polyp of colon 09/18/2017  . Prostate cancer ([place]) 09/18/2017  . Essential hypertension 08/26/2017  . Hyperlipidemia 08/26/2017  . Cerebral venous thrombosis 07/13/2017  . Intracerebral hemorrhage 07/10/2017  . ICH (intracerebral hemorrhage) ([place]) 07/10/2017  . Unstable angina [facility]) 03/12/2013    Silvestre Mesi 07/15/2019, 9:26 AM  [facility] Physical Therapy [address] [place], [place], [address] Phone: [phone]   Fax:  [phone]  Name: Justin Adams MRN: [id number] Date of Birth: September 24, 1945

## 2019-07-19 DIAGNOSIS — N200 Calculus of kidney: Secondary | ICD-10-CM | POA: Diagnosis not present

## 2019-07-20 ENCOUNTER — Ambulatory Visit (INDEPENDENT_AMBULATORY_CARE_PROVIDER_SITE_OTHER): Payer: Medicare Other | Admitting: Physical Therapy

## 2019-07-20 ENCOUNTER — Other Ambulatory Visit: Payer: Self-pay

## 2019-07-20 DIAGNOSIS — I69154 Hemiplegia and hemiparesis following nontraumatic intracerebral hemorrhage affecting left non-dominant side: Secondary | ICD-10-CM | POA: Diagnosis not present

## 2019-07-20 DIAGNOSIS — G8929 Other chronic pain: Secondary | ICD-10-CM

## 2019-07-20 DIAGNOSIS — R2681 Unsteadiness on feet: Secondary | ICD-10-CM

## 2019-07-20 DIAGNOSIS — M6281 Muscle weakness (generalized): Secondary | ICD-10-CM

## 2019-07-20 DIAGNOSIS — M545 Low back pain: Secondary | ICD-10-CM

## 2019-07-20 NOTE — Therapy (Signed)
[place], [place], [address] Phone: [phone]   Fax:  [phone]  Physical Therapy Treatment  Patient Details  Name: Justin Adams MRN: [id number] Date of Birth: [birth year] Referring Provider (PT): [name], MD   Encounter Date: 07/20/2019  PT End of Session - 07/20/19 0856    Visit Number  23    Number of Visits  36    Date for PT Re-Evaluation  91/63/84   recert performed on 66/5 to extend POC   Authorization Type  MCR AARP    PT Start Time  0803    PT Stop Time  0847    PT Time Calculation (min)  44 min    Activity Tolerance  Patient tolerated treatment well    Behavior During Therapy  [facility] for tasks assessed/performed       Past Medical History:  Diagnosis Date  . Adenomatous polyp   . Bleeding internal hemorrhoids 10/2017  . Cerebral venous thrombosis   . Grade I diastolic dysfunction 99/35/7017   noted on ECHO   . Hemorrhoids   . History of kidney stones   . Hypercholesteremia   . Hypertension   . Intracranial hemorrhage (HCC)    Right temporal lobe  . LVH (left ventricular hypertrophy) 07/11/2017   Mild, noted on ECHO   . Muscular degeneration   . Nephrolithiasis   . Obesity   . OSA (obstructive sleep apnea)   . Polycythemia   . Prostate cancer ([place]) 2003  . Shingles   . Stroke ([place]) 06/2017   No residual  . Unsteady gait   . Ureteral calculi 02/12/2008   Left    Past Surgical History:  Procedure Laterality Date  . APPENDECTOMY     childhood  . CHOLECYSTECTOMY    . COLONOSCOPY    . HEMORRHOID BANDING  2015  . HOLMIUM LASER APPLICATION Left 79/08/9028   Procedure: HOLMIUM LASER APPLICATION;  Surgeon: Franchot Gallo, MD;  Location: [facility];  Service: Urology;  Laterality: Left;  . IR ANGIO EXTERNAL CAROTID SEL EXT CAROTID UNI R MOD SED  07/10/2017  . IR ANGIO INTRA EXTRACRAN SEL INTERNAL CAROTID BILAT MOD SED  07/10/2017  . IR ANGIO VERTEBRAL SEL VERTEBRAL BILAT MOD SED   07/10/2017  . IR URETERAL STENT LEFT NEW ACCESS W/O SEP NEPHROSTOMY CATH  03/12/2018  . LEFT HEART CATHETERIZATION WITH CORONARY ANGIOGRAM N/A 03/15/2013   Procedure: LEFT HEART CATHETERIZATION WITH CORONARY ANGIOGRAM;  Surgeon: Minus Breeding, MD;  Location: [facility] CATH LAB;  Service: Cardiovascular;  Laterality: N/A;  . NEPHROLITHOTOMY Left 03/12/2018   Procedure: LEFT NEPHROLITHOTOMY PERCUTANEOUS, STENT PLACEMENT;  Surgeon: Franchot Gallo, MD;  [facility];  Service: Urology;  Laterality: Left;  . PROSTATECTOMY    . STONE EXTRACTION WITH BASKET      There were no vitals filed for this visit.  Subjective Assessment - 07/20/19 0851    Subjective  no back pain today but has been having shoulder pain, he thinks it might be due to some of the rows so this was held today    Pertinent History  PMH: HTN, CVA 06/2017, prostate Ca 2003,    Patient Stated Goals  improve gait and conditioning    Pain Onset  More than a month ago       [facility] Adult PT Treatment/Exercise - 07/20/19 0001      Self-Care   Self-Care  Other Self-Care Comments    Other Self-Care Comments   self care education for sleeping  positions to reduce shoulder pain      Lumbar Exercises: Aerobic   Recumbent Bike  8 min L2      Lumbar Exercises: Machines for Strengthening   Leg Press  Shuttle leg press 112 lbs 2X15 reps      Lumbar Exercises: Standing   Other Standing Lumbar Exercises  deadlift with blue band X 15 reps, squats at counter top X 15 reps, lateral walking with L3 band X 2 reps    Other Standing Lumbar Exercises  farmers carry 15 lb KB two laps down and back in gym for each side      Lumbar Exercises: Seated   Long Arc Quad on Chair  Both;20 reps    LAQ on Chair Limitations  L3 band    Sit to Stand Limitations  UE push up from knees with airex pad in chair 2X5, attempted without airex pad but unable              PT Education - 07/20/19 0856    Education Details  self care for sleeping positions     Methods  Explanation;Demonstration    Comprehension  Verbalized understanding          PT Long Term Goals - 07/08/19 1140      PT LONG TERM GOAL #1   Title  Pt will report performing HEP, walking program and transition to gym at end of PT    Baseline  compliant with old HEP but progressed HEP today due to progress    Time  6    Period  Weeks    Status  Achieved      PT LONG TERM GOAL #2   Title  Pt will increaese overall leg strength to at least 4+ to 5- overall to improve function    Baseline  Met 05/12/19    Time  6    Period  Weeks    Status  Achieved      PT LONG TERM GOAL #3   Title  Will improve BERG to at least 53 to show improved balance    Baseline  now 54 on 12/2    Time  6    Period  Weeks    Status  Achieved      PT LONG TERM GOAL #4   Title  Will improve FGA to at least 29 to lower falls risk    Baseline  27    Time  6    Period  Weeks    Status  On-going      PT LONG TERM GOAL #5   Title  Will improve 6 minute walk test to 1560 ft    Baseline  .27 miles    Time  6    Period  Weeks    Status  On-going            Plan - 07/20/19 0856    Clinical Impression Statement  Due to shoulder pain, upper body strengthening was held today and instead focused on LE strength and general conditioning, He was shown better sleeping postures and positions to reduce stress on his shoulders.PT will monitor shoulder pain as we continue    Personal Factors and Comorbidities  Comorbidity 1;Comorbidity 2    Comorbidities  PMH: HTN, CVA 06/2017, prostate Ca 2003,    Examination-Activity Limitations  Bend;Squat;Stairs;Stand;Lift;Locomotion Level    Examination-Participation Restrictions  Community Activity;Shop;Yard Work    Journalist, newspaper    Rehab Potential  Good  PT Frequency  2x / week    PT Duration  6 weeks    PT Treatment/Interventions  Aquatic Therapy;Cryotherapy;Electrical Stimulation;Moist Heat;Gait  training;Stair training;Functional mobility training;Therapeutic activities;Therapeutic exercise;Balance training;Neuromuscular re-education;Manual techniques;Passive range of motion;Taping;Joint Manipulations    PT Next Visit Plan  lumbar stretch and strength, needs gait, balance, LE strength and conditioning    PT Home Exercise Plan  Access Code: [email or web address], added parial lunges, sit to stands, step ups fwd, lat, retro, also added LTR, SKTC, pelvic tilts, bridges, hip flexor stretch, HSS    Consulted and Agree with Plan of Care  Patient       Patient will benefit from skilled therapeutic intervention in order to improve the following deficits and impairments:  Abnormal gait, Decreased activity tolerance, Decreased balance, Decreased endurance, Decreased strength, Difficulty walking  Visit Diagnosis: Unsteadiness on feet  Muscle weakness (generalized)  Chronic bilateral low back pain without sciatica  Hemiplegia and hemiparesis following nontraumatic intracerebral hemorrhage affecting left non-dominant side [facility])     Problem List Patient Active Problem List   Diagnosis Date Noted  . Calculus of kidney 03/12/2018  . Bleeding internal hemorrhoids 10/09/2017  . Sleep apnea 09/22/2017  . History of adenomatous polyp of colon 09/18/2017  . Prostate cancer ([place]) 09/18/2017  . Essential hypertension 08/26/2017  . Hyperlipidemia 08/26/2017  . Cerebral venous thrombosis 07/13/2017  . Intracerebral hemorrhage 07/10/2017  . ICH (intracerebral hemorrhage) ([place]) 07/10/2017  . Unstable angina [facility]) 03/12/2013    Silvestre Mesi 07/20/2019, 10:44 AM  [facility] Physical Therapy [address] [place], [place], [address] Phone: [phone]   Fax:  [phone]  Name: Justin Adams MRN: [id number] Date of Birth: December 13, 1945

## 2019-07-22 ENCOUNTER — Other Ambulatory Visit: Payer: Self-pay

## 2019-07-22 ENCOUNTER — Ambulatory Visit (INDEPENDENT_AMBULATORY_CARE_PROVIDER_SITE_OTHER): Payer: Medicare Other | Admitting: Physical Therapy

## 2019-07-22 DIAGNOSIS — R2681 Unsteadiness on feet: Secondary | ICD-10-CM | POA: Diagnosis not present

## 2019-07-22 DIAGNOSIS — G8929 Other chronic pain: Secondary | ICD-10-CM | POA: Diagnosis not present

## 2019-07-22 DIAGNOSIS — R29818 Other symptoms and signs involving the nervous system: Secondary | ICD-10-CM

## 2019-07-22 DIAGNOSIS — I69154 Hemiplegia and hemiparesis following nontraumatic intracerebral hemorrhage affecting left non-dominant side: Secondary | ICD-10-CM

## 2019-07-22 DIAGNOSIS — M545 Low back pain: Secondary | ICD-10-CM | POA: Diagnosis not present

## 2019-07-22 DIAGNOSIS — M6281 Muscle weakness (generalized): Secondary | ICD-10-CM | POA: Diagnosis not present

## 2019-07-22 NOTE — Therapy (Signed)
[place], [place], [address] Phone: [phone]   Fax:  [phone]  Physical Therapy Treatment  Patient Details  Name: Justin Adams MRN: [id number] Date of Birth: Aug 18, 1945 Referring Provider (PT): [name], MD   Encounter Date: 07/22/2019  PT End of Session - 07/22/19 0839    Visit Number  24    Number of Visits  36    Date for PT Re-Evaluation  16/01/09   recert performed on 32/3 to extend POC   Authorization Type  MCR AARP    PT Start Time  0800    PT Stop Time  0853    PT Time Calculation (min)  53 min    Activity Tolerance  Patient tolerated treatment well    Behavior During Therapy  [facility] for tasks assessed/performed       Past Medical History:  Diagnosis Date  . Adenomatous polyp   . Bleeding internal hemorrhoids 10/2017  . Cerebral venous thrombosis   . Grade I diastolic dysfunction 55/73/2202   noted on ECHO   . Hemorrhoids   . History of kidney stones   . Hypercholesteremia   . Hypertension   . Intracranial hemorrhage (HCC)    Right temporal lobe  . LVH (left ventricular hypertrophy) 07/11/2017   Mild, noted on ECHO   . Muscular degeneration   . Nephrolithiasis   . Obesity   . OSA (obstructive sleep apnea)   . Polycythemia   . Prostate cancer ([place]) 2003  . Shingles   . Stroke ([place]) 06/2017   No residual  . Unsteady gait   . Ureteral calculi 02/12/2008   Left    Past Surgical History:  Procedure Laterality Date  . APPENDECTOMY     childhood  . CHOLECYSTECTOMY    . COLONOSCOPY    . HEMORRHOID BANDING  2015  . HOLMIUM LASER APPLICATION Left 54/07/7060   Procedure: HOLMIUM LASER APPLICATION;  Surgeon: Franchot Gallo, MD;  [facility];  Service: Urology;  Laterality: Left;  . IR ANGIO EXTERNAL CAROTID SEL EXT CAROTID UNI R MOD SED  07/10/2017  . IR ANGIO INTRA EXTRACRAN SEL INTERNAL CAROTID BILAT MOD SED  07/10/2017  . IR ANGIO VERTEBRAL SEL VERTEBRAL BILAT MOD SED   07/10/2017  . IR URETERAL STENT LEFT NEW ACCESS W/O SEP NEPHROSTOMY CATH  03/12/2018  . LEFT HEART CATHETERIZATION WITH CORONARY ANGIOGRAM N/A 03/15/2013   Procedure: LEFT HEART CATHETERIZATION WITH CORONARY ANGIOGRAM;  Surgeon: Minus Breeding, MD;  Location: [facility] CATH LAB;  Service: Cardiovascular;  Laterality: N/A;  . NEPHROLITHOTOMY Left 03/12/2018   Procedure: LEFT NEPHROLITHOTOMY PERCUTANEOUS, STENT PLACEMENT;  Surgeon: Franchot Gallo, MD;  [facility];  Service: Urology;  Laterality: Left;  . PROSTATECTOMY    . STONE EXTRACTION WITH BASKET      There were no vitals filed for this visit.  Subjective Assessment - 07/22/19 0838    Subjective  no complaints today, his shoulder pain is improving some    Pertinent History  PMH: HTN, CVA 06/2017, prostate Ca 2003,    Patient Stated Goals  improve gait and conditioning    Pain Onset  More than a month ago         [facility] PT Assessment - 07/22/19 0001      Assessment   Medical Diagnosis  Unsteady Gait, LBP    Referring Provider (PT)  [place], Hal, MD      Single Leg Stance   Comments  30 sec holds and had to  touch down 4 times on Lt, only 2 times or Rt                   [facility] Adult PT Treatment/Exercise - 07/22/19 0001      Lumbar Exercises: Aerobic   Recumbent Bike  8 min L2      Lumbar Exercises: Machines for Strengthening   Leg Press  Shuttle leg press 112 lbs 2X15 reps      Lumbar Exercises: Standing   Row  20 reps    Theraband Level (Row)  Level 3 (Green)    Shoulder Extension  20 reps    Theraband Level (Shoulder Extension)  Level 3 (Green)    Other Standing Lumbar Exercises  sidestepping with L3 band 30 ft X2    Other Standing Lumbar Exercises  farmers carry 15 lb KB two laps down and back in gym for each side      Lumbar Exercises: Seated   Sit to Stand Limitations  UE push up from knees with airex pad in chair 2X5, attempted without airex pad but unable     Other Seated Lumbar Exercises  hamstring  curl X 20 reps bilat with L3 band      Traction   Type of Traction  Lumbar    Min (lbs)  80    Max (lbs)  95    Time  15 min total preset program                  PT Long Term Goals - 07/08/19 1140      PT LONG TERM GOAL #1   Title  Pt will report performing HEP, walking program and transition to gym at end of PT    Baseline  compliant with old HEP but progressed HEP today due to progress    Time  6    Period  Weeks    Status  Achieved      PT LONG TERM GOAL #2   Title  Pt will increaese overall leg strength to at least 4+ to 5- overall to improve function    Baseline  Met 05/12/19    Time  6    Period  Weeks    Status  Achieved      PT LONG TERM GOAL #3   Title  Will improve BERG to at least 53 to show improved balance    Baseline  now 54 on 12/2    Time  6    Period  Weeks    Status  Achieved      PT LONG TERM GOAL #4   Title  Will improve FGA to at least 29 to lower falls risk    Baseline  27    Time  6    Period  Weeks    Status  On-going      PT LONG TERM GOAL #5   Title  Will improve 6 minute walk test to 1560 ft    Baseline  .27 miles    Time  6    Period  Weeks    Status  On-going            Plan - 07/22/19 0843    Clinical Impression Statement  Progressed back some upper body work for posture without shoulder complaints. continued to address balance and general strength and conditioning with good tolerance. Continued with mechanical traciton for spinal mobility and this appears to be effectively managing his pain.    Comorbidities  PMH: HTN, CVA 06/2017, prostate Ca 2003,    Examination-Activity Limitations  Bend;Squat;Stairs;Stand;Lift;Locomotion Level    Examination-Participation Restrictions  Community Activity;Shop;Yard Work    Rehab Potential  Good    PT Frequency  2x / week    PT Duration  6 weeks    PT Treatment/Interventions  Aquatic Therapy;Cryotherapy;Electrical Stimulation;Moist Heat;Gait training;Stair training;Functional  mobility training;Therapeutic activities;Therapeutic exercise;Balance training;Neuromuscular re-education;Manual techniques;Passive range of motion;Taping;Joint Manipulations    PT Next Visit Plan  lumbar stretch and strength, needs gait, balance, LE strength and conditioning    PT Home Exercise Plan  Access Code: [email or web address], added parial lunges, sit to stands, step ups fwd, lat, retro, also added LTR, SKTC, pelvic tilts, bridges, hip flexor stretch, HSS    Consulted and Agree with Plan of Care  Patient       Patient will benefit from skilled therapeutic intervention in order to improve the following deficits and impairments:  Abnormal gait, Decreased activity tolerance, Decreased balance, Decreased endurance, Decreased strength, Difficulty walking  Visit Diagnosis: Unsteadiness on feet  Muscle weakness (generalized)  Chronic bilateral low back pain without sciatica  Hemiplegia and hemiparesis following nontraumatic intracerebral hemorrhage affecting left non-dominant side (HCC)  Other symptoms and signs involving the nervous system     Problem List Patient Active Problem List   Diagnosis Date Noted  . Calculus of kidney 03/12/2018  . Bleeding internal hemorrhoids 10/09/2017  . Sleep apnea 09/22/2017  . History of adenomatous polyp of colon 09/18/2017  . Prostate cancer ([place]) 09/18/2017  . Essential hypertension 08/26/2017  . Hyperlipidemia 08/26/2017  . Cerebral venous thrombosis 07/13/2017  . Intracerebral hemorrhage 07/10/2017  . ICH (intracerebral hemorrhage) ([place]) 07/10/2017  . Unstable angina [facility]) 03/12/2013    Silvestre Mesi 07/22/2019, 8:46 AM  [facility] Physical Therapy [address] [place], [place], [address] Phone: [phone]   Fax:  [phone]  Name: ASHLY GOETHE MRN: [id number] Date of Birth: [birth year]

## 2019-07-26 DIAGNOSIS — D2372 Other benign neoplasm of skin of left lower limb, including hip: Secondary | ICD-10-CM | POA: Diagnosis not present

## 2019-07-26 DIAGNOSIS — D2271 Melanocytic nevi of right lower limb, including hip: Secondary | ICD-10-CM | POA: Diagnosis not present

## 2019-07-26 DIAGNOSIS — D1722 Benign lipomatous neoplasm of skin and subcutaneous tissue of left arm: Secondary | ICD-10-CM | POA: Diagnosis not present

## 2019-07-26 DIAGNOSIS — L918 Other hypertrophic disorders of the skin: Secondary | ICD-10-CM | POA: Diagnosis not present

## 2019-07-26 DIAGNOSIS — L821 Other seborrheic keratosis: Secondary | ICD-10-CM | POA: Diagnosis not present

## 2019-07-26 DIAGNOSIS — D2272 Melanocytic nevi of left lower limb, including hip: Secondary | ICD-10-CM | POA: Diagnosis not present

## 2019-07-27 ENCOUNTER — Ambulatory Visit (INDEPENDENT_AMBULATORY_CARE_PROVIDER_SITE_OTHER): Payer: Medicare Other | Admitting: Physical Therapy

## 2019-07-27 ENCOUNTER — Other Ambulatory Visit: Payer: Self-pay

## 2019-07-27 DIAGNOSIS — R2681 Unsteadiness on feet: Secondary | ICD-10-CM | POA: Diagnosis not present

## 2019-07-27 DIAGNOSIS — M545 Low back pain, unspecified: Secondary | ICD-10-CM

## 2019-07-27 DIAGNOSIS — G8929 Other chronic pain: Secondary | ICD-10-CM | POA: Diagnosis not present

## 2019-07-27 DIAGNOSIS — I69154 Hemiplegia and hemiparesis following nontraumatic intracerebral hemorrhage affecting left non-dominant side: Secondary | ICD-10-CM

## 2019-07-27 DIAGNOSIS — M6281 Muscle weakness (generalized): Secondary | ICD-10-CM | POA: Diagnosis not present

## 2019-07-27 DIAGNOSIS — R29818 Other symptoms and signs involving the nervous system: Secondary | ICD-10-CM

## 2019-07-27 NOTE — Therapy (Signed)
[place], [place], [address] Phone: [phone]   Fax:  [phone]  Physical Therapy Treatment  Patient Details  Name: Justin Adams MRN: [id number] Date of Birth: 1945/06/24 Referring Provider (PT): [name], MD   Encounter Date: 07/27/2019  PT End of Session - 07/27/19 1028    Visit Number  25    Number of Visits  36    Date for PT Re-Evaluation  81/44/81   recert performed on 85/6 to extend POC   Authorization Type  MCR AARP    PT Start Time  0802    PT Stop Time  0855    PT Time Calculation (min)  53 min    Activity Tolerance  Patient tolerated treatment well    Behavior During Therapy  [facility] for tasks assessed/performed       Past Medical History:  Diagnosis Date  . Adenomatous polyp   . Bleeding internal hemorrhoids 10/2017  . Cerebral venous thrombosis   . Grade I diastolic dysfunction 31/49/7026   noted on ECHO   . Hemorrhoids   . History of kidney stones   . Hypercholesteremia   . Hypertension   . Intracranial hemorrhage (HCC)    Right temporal lobe  . LVH (left ventricular hypertrophy) 07/11/2017   Mild, noted on ECHO   . Muscular degeneration   . Nephrolithiasis   . Obesity   . OSA (obstructive sleep apnea)   . Polycythemia   . Prostate cancer ([place]) 2003  . Shingles   . Stroke ([place]) 06/2017   No residual  . Unsteady gait   . Ureteral calculi 02/12/2008   Left    Past Surgical History:  Procedure Laterality Date  . APPENDECTOMY     childhood  . CHOLECYSTECTOMY    . COLONOSCOPY    . HEMORRHOID BANDING  2015  . HOLMIUM LASER APPLICATION Left 37/01/5884   Procedure: HOLMIUM LASER APPLICATION;  Surgeon: Franchot Gallo, MD;  [facility];  Service: Urology;  Laterality: Left;  . IR ANGIO EXTERNAL CAROTID SEL EXT CAROTID UNI R MOD SED  07/10/2017  . IR ANGIO INTRA EXTRACRAN SEL INTERNAL CAROTID BILAT MOD SED  07/10/2017  . IR ANGIO VERTEBRAL SEL VERTEBRAL BILAT MOD SED   07/10/2017  . IR URETERAL STENT LEFT NEW ACCESS W/O SEP NEPHROSTOMY CATH  03/12/2018  . LEFT HEART CATHETERIZATION WITH CORONARY ANGIOGRAM N/A 03/15/2013   Procedure: LEFT HEART CATHETERIZATION WITH CORONARY ANGIOGRAM;  Surgeon: Minus Breeding, MD;  Location: [facility] CATH LAB;  Service: Cardiovascular;  Laterality: N/A;  . NEPHROLITHOTOMY Left 03/12/2018   Procedure: LEFT NEPHROLITHOTOMY PERCUTANEOUS, STENT PLACEMENT;  Surgeon: Franchot Gallo, MD;  [facility];  Service: Urology;  Laterality: Left;  . PROSTATECTOMY    . STONE EXTRACTION WITH BASKET      There were no vitals filed for this visit.  Subjective Assessment - 07/27/19 1017    Subjective  pt arrives frustrated due to losing power all weekend but denies any back pain, sitll having some mild shoulder pain, especially with sleeping    Pertinent History  PMH: HTN, CVA 06/2017, prostate Ca 2003,    Patient Stated Goals  improve gait and conditioning    Pain Onset  More than a month ago                       [facility] Adult PT Treatment/Exercise - 07/27/19 0001      High Level Balance   High Level Balance  Comments  SLS  x30 sec  X3 reps bilat       Lumbar Exercises: Aerobic   Tread Mill  7 minutes 2.6 mph      Lumbar Exercises: Machines for Strengthening   Leg Press  Shuttle leg press 112 lbs 2X15 reps      Lumbar Exercises: Standing   Other Standing Lumbar Exercises  deadlift with L4 band X 15 reps    Other Standing Lumbar Exercises  farmers carry 10 lb KB two laps down and back in gym for each side      Lumbar Exercises: Seated   Sit to Stand Limitations  UE push up from knees with airex pad in chair 2X5, attempted without airex pad but unable       Knee/Hip Exercises: Seated   Long Arc Quad  Both;20 reps    Long Arc Quad Weight  5 lbs.    Hamstring Curl  Both;20 reps    Hamstring Limitations  L3      Traction   Type of Traction  Lumbar    Min (lbs)  80    Max (lbs)  95    Time  15 min total preset  program                  PT Long Term Goals - 07/08/19 1140      PT LONG TERM GOAL #1   Title  Pt will report performing HEP, walking program and transition to gym at end of PT    Baseline  compliant with old HEP but progressed HEP today due to progress    Time  6    Period  Weeks    Status  Achieved      PT LONG TERM GOAL #2   Title  Pt will increaese overall leg strength to at least 4+ to 5- overall to improve function    Baseline  Met 05/12/19    Time  6    Period  Weeks    Status  Achieved      PT LONG TERM GOAL #3   Title  Will improve BERG to at least 53 to show improved balance    Baseline  now 54 on 12/2    Time  6    Period  Weeks    Status  Achieved      PT LONG TERM GOAL #4   Title  Will improve FGA to at least 29 to lower falls risk    Baseline  27    Time  6    Period  Weeks    Status  On-going      PT LONG TERM GOAL #5   Title  Will improve 6 minute walk test to 1560 ft    Baseline  .27 miles    Time  6    Period  Weeks    Status  On-going            Plan - 07/27/19 1028    Clinical Impression Statement  Decreased UE strengthing some today due to shoulder pain, session focused on lumbar and LE strengthing as tolerated. SLS improved today showing better balance. Traction continued for pain management.    Comorbidities  PMH: HTN, CVA 06/2017, prostate Ca 2003,    Examination-Activity Limitations  Bend;Squat;Stairs;Stand;Lift;Locomotion Level    Examination-Participation Restrictions  Community Activity;Shop;Yard Work    Rehab Potential  Good    PT Frequency  2x / week    PT Duration  6 weeks    PT Treatment/Interventions  Aquatic Therapy;Cryotherapy;Electrical Stimulation;Moist Heat;Gait training;Stair training;Functional mobility training;Therapeutic activities;Therapeutic exercise;Balance training;Neuromuscular re-education;Manual techniques;Passive range of motion;Taping;Joint Manipulations    PT Next Visit Plan  lumbar stretch and  strength, needs gait, balance, LE strength and conditioning    PT Home Exercise Plan  Access Code: [email or web address], added parial lunges, sit to stands, step ups fwd, lat, retro, also added LTR, SKTC, pelvic tilts, bridges, hip flexor stretch, HSS    Consulted and Agree with Plan of Care  Patient       Patient will benefit from skilled therapeutic intervention in order to improve the following deficits and impairments:  Abnormal gait, Decreased activity tolerance, Decreased balance, Decreased endurance, Decreased strength, Difficulty walking  Visit Diagnosis: Unsteadiness on feet  Muscle weakness (generalized)  Chronic bilateral low back pain without sciatica  Hemiplegia and hemiparesis following nontraumatic intracerebral hemorrhage affecting left non-dominant side (HCC)  Other symptoms and signs involving the nervous system     Problem List Patient Active Problem List   Diagnosis Date Noted  . Calculus of kidney 03/12/2018  . Bleeding internal hemorrhoids 10/09/2017  . Sleep apnea 09/22/2017  . History of adenomatous polyp of colon 09/18/2017  . Prostate cancer ([place]) 09/18/2017  . Essential hypertension 08/26/2017  . Hyperlipidemia 08/26/2017  . Cerebral venous thrombosis 07/13/2017  . Intracerebral hemorrhage 07/10/2017  . ICH (intracerebral hemorrhage) ([place]) 07/10/2017  . Unstable angina [facility]) 03/12/2013    Silvestre Mesi 07/27/2019, 10:31 AM  [facility] Physical Therapy [address] [place], [place], [address] Phone: [phone]   Fax:  [phone]  Name: Justin Adams MRN: [id number] Date of Birth: 10/22/1945

## 2019-07-28 ENCOUNTER — Telehealth: Payer: Self-pay | Admitting: Neurology

## 2019-07-28 ENCOUNTER — Other Ambulatory Visit: Payer: Self-pay

## 2019-07-28 MED ORDER — APIXABAN 5 MG PO TABS: 5.0000 mg | ORAL_TABLET | Freq: Two times a day (BID) | ORAL | 3 refills | Status: DC

## 2019-07-28 NOTE — Telephone Encounter (Signed)
Patient called and said he has new prescription insurance coverage and he needs a new prescription for Eliquis 5 MG tablet in 90 day increments.  [facility] [phone]

## 2019-07-29 ENCOUNTER — Encounter: Payer: Medicare Other | Admitting: Physical Therapy

## 2019-08-03 ENCOUNTER — Other Ambulatory Visit: Payer: Self-pay

## 2019-08-03 ENCOUNTER — Encounter: Payer: Self-pay | Admitting: Physical Therapy

## 2019-08-03 ENCOUNTER — Ambulatory Visit (INDEPENDENT_AMBULATORY_CARE_PROVIDER_SITE_OTHER): Payer: Medicare Other | Admitting: Physical Therapy

## 2019-08-03 DIAGNOSIS — M6281 Muscle weakness (generalized): Secondary | ICD-10-CM

## 2019-08-03 DIAGNOSIS — R2681 Unsteadiness on feet: Secondary | ICD-10-CM | POA: Diagnosis not present

## 2019-08-03 DIAGNOSIS — I69154 Hemiplegia and hemiparesis following nontraumatic intracerebral hemorrhage affecting left non-dominant side: Secondary | ICD-10-CM | POA: Diagnosis not present

## 2019-08-03 DIAGNOSIS — G8929 Other chronic pain: Secondary | ICD-10-CM

## 2019-08-03 DIAGNOSIS — M545 Low back pain: Secondary | ICD-10-CM

## 2019-08-03 NOTE — Therapy (Signed)
[place], [place], [address] Phone: [phone]   Fax:  [phone]  Physical Therapy Treatment  Patient Details  Name: Justin Adams MRN: [id number] Date of Birth: 09/25/1945 Referring Provider (PT): [name], MD   Encounter Date: 08/03/2019  PT End of Session - 08/03/19 0939    Visit Number  26    Number of Visits  36    Date for PT Re-Evaluation  24/40/10   recert performed on 27/2 to extend POC   Authorization Type  MCR AARP    PT Start Time  0800    PT Stop Time  0855    PT Time Calculation (min)  55 min    Activity Tolerance  Patient tolerated treatment well    Behavior During Therapy  [facility] for tasks assessed/performed       Past Medical History:  Diagnosis Date  . Adenomatous polyp   . Bleeding internal hemorrhoids 10/2017  . Cerebral venous thrombosis   . Grade I diastolic dysfunction 53/66/4403   noted on ECHO   . Hemorrhoids   . History of kidney stones   . Hypercholesteremia   . Hypertension   . Intracranial hemorrhage (HCC)    Right temporal lobe  . LVH (left ventricular hypertrophy) 07/11/2017   Mild, noted on ECHO   . Muscular degeneration   . Nephrolithiasis   . Obesity   . OSA (obstructive sleep apnea)   . Polycythemia   . Prostate cancer ([place]) 2003  . Shingles   . Stroke ([place]) 06/2017   No residual  . Unsteady gait   . Ureteral calculi 02/12/2008   Left    Past Surgical History:  Procedure Laterality Date  . APPENDECTOMY     childhood  . CHOLECYSTECTOMY    . COLONOSCOPY    . HEMORRHOID BANDING  2015  . HOLMIUM LASER APPLICATION Left 47/09/2593   Procedure: HOLMIUM LASER APPLICATION;  Surgeon: Franchot Gallo, MD;  Location: [facility];  Service: Urology;  Laterality: Left;  . IR ANGIO EXTERNAL CAROTID SEL EXT CAROTID UNI R MOD SED  07/10/2017  . IR ANGIO INTRA EXTRACRAN SEL INTERNAL CAROTID BILAT MOD SED  07/10/2017  . IR ANGIO VERTEBRAL SEL VERTEBRAL BILAT MOD SED   07/10/2017  . IR URETERAL STENT LEFT NEW ACCESS W/O SEP NEPHROSTOMY CATH  03/12/2018  . LEFT HEART CATHETERIZATION WITH CORONARY ANGIOGRAM N/A 03/15/2013   Procedure: LEFT HEART CATHETERIZATION WITH CORONARY ANGIOGRAM;  Surgeon: Minus Breeding, MD;  Location: [facility] CATH LAB;  Service: Cardiovascular;  Laterality: N/A;  . NEPHROLITHOTOMY Left 03/12/2018   Procedure: LEFT NEPHROLITHOTOMY PERCUTANEOUS, STENT PLACEMENT;  Surgeon: Franchot Gallo, MD;  [facility];  Service: Urology;  Laterality: Left;  . PROSTATECTOMY    . STONE EXTRACTION WITH BASKET      There were no vitals filed for this visit.  Subjective Assessment - 08/03/19 0910    Subjective  no pain upon arrival, he had some back pain yesterday after cooking but it feels okay today. His shoulder pain is also getting better    Pertinent History  PMH: HTN, CVA 06/2017, prostate Ca 2003,    Patient Stated Goals  improve gait and conditioning    Pain Onset  More than a month ago                       [facility] Adult PT Treatment/Exercise - 08/03/19 0001      High Level Balance   High Level  Balance Comments  SLS  x30 sec  X3 reps bilat       Lumbar Exercises: Aerobic   Nustep  8 min L6      Lumbar Exercises: Machines for Strengthening   Leg Press  Shuttle leg press 112 lbs 2X15 reps, did one warmup set of 20 reps with 50 lbs      Lumbar Exercises: Standing   Other Standing Lumbar Exercises  deadlift with L4 band X 20 reps    Other Standing Lumbar Exercises  farmers carry 10 lb KB two laps down and back in gym for each side      Lumbar Exercises: Seated   Long Arc Quad on Chair  Both;20 reps    LAQ on Chair Weights (lbs)  4    Sit to Stand Limitations  UE push up from knees with airex pad in chair 2X5, attempted without airex pad but unable       Knee/Hip Exercises: Standing   Hip Flexion  Both;20 reps    Hip Flexion Limitations  4 lbs    Hip Abduction  Both;20 reps    Abduction Limitations  4 lbs       Knee/Hip Exercises: Seated   Long Arc Quad  Both;20 reps    Long Arc Quad Weight  5 lbs.    Hamstring Curl  Both;20 reps    Hamstring Limitations  L3      Traction   Type of Traction  Lumbar    Min (lbs)  80    Max (lbs)  95    Time  15 min total preset program                  PT Long Term Goals - 07/08/19 1140      PT LONG TERM GOAL #1   Title  Pt will report performing HEP, walking program and transition to gym at end of PT    Baseline  compliant with old HEP but progressed HEP today due to progress    Time  6    Period  Weeks    Status  Achieved      PT LONG TERM GOAL #2   Title  Pt will increaese overall leg strength to at least 4+ to 5- overall to improve function    Baseline  Met 05/12/19    Time  6    Period  Weeks    Status  Achieved      PT LONG TERM GOAL #3   Title  Will improve BERG to at least 53 to show improved balance    Baseline  now 54 on 12/2    Time  6    Period  Weeks    Status  Achieved      PT LONG TERM GOAL #4   Title  Will improve FGA to at least 29 to lower falls risk    Baseline  27    Time  6    Period  Weeks    Status  On-going      PT LONG TERM GOAL #5   Title  Will improve 6 minute walk test to 1560 ft    Baseline  .27 miles    Time  6    Period  Weeks    Status  On-going            Plan - 08/03/19 0940    Clinical Impression Statement  he showed improved balance and SLS abilility today. Able to  perform all exercises without complaints. Traction continued for pain management with good results.       Patient will benefit from skilled therapeutic intervention in order to improve the following deficits and impairments:     Visit Diagnosis: Unsteadiness on feet  Muscle weakness (generalized)  Chronic bilateral low back pain without sciatica  Hemiplegia and hemiparesis following nontraumatic intracerebral hemorrhage affecting left non-dominant side [facility])     Problem List Patient Active Problem List    Diagnosis Date Noted  . Calculus of kidney 03/12/2018  . Bleeding internal hemorrhoids 10/09/2017  . Sleep apnea 09/22/2017  . History of adenomatous polyp of colon 09/18/2017  . Prostate cancer ([place]) 09/18/2017  . Essential hypertension 08/26/2017  . Hyperlipidemia 08/26/2017  . Cerebral venous thrombosis 07/13/2017  . Intracerebral hemorrhage 07/10/2017  . ICH (intracerebral hemorrhage) ([place]) 07/10/2017  . Unstable angina [facility]) 03/12/2013    Silvestre Mesi 08/03/2019, 9:41 AM  [facility] Physical Therapy [address] [place], [place], [address] Phone: [phone]   Fax:  [phone]  Name: Justin Adams MRN: [id number] Date of Birth: 14-Nov-1945

## 2019-08-05 ENCOUNTER — Encounter: Payer: Medicare Other | Admitting: Physical Therapy

## 2019-08-05 ENCOUNTER — Other Ambulatory Visit: Payer: Self-pay

## 2019-08-05 ENCOUNTER — Ambulatory Visit (INDEPENDENT_AMBULATORY_CARE_PROVIDER_SITE_OTHER): Payer: Medicare Other | Admitting: Physical Therapy

## 2019-08-05 DIAGNOSIS — M545 Low back pain, unspecified: Secondary | ICD-10-CM

## 2019-08-05 DIAGNOSIS — G8929 Other chronic pain: Secondary | ICD-10-CM | POA: Diagnosis not present

## 2019-08-05 DIAGNOSIS — R29818 Other symptoms and signs involving the nervous system: Secondary | ICD-10-CM | POA: Diagnosis not present

## 2019-08-05 DIAGNOSIS — M6281 Muscle weakness (generalized): Secondary | ICD-10-CM

## 2019-08-05 DIAGNOSIS — I69154 Hemiplegia and hemiparesis following nontraumatic intracerebral hemorrhage affecting left non-dominant side: Secondary | ICD-10-CM

## 2019-08-05 DIAGNOSIS — R2681 Unsteadiness on feet: Secondary | ICD-10-CM

## 2019-08-05 NOTE — Therapy (Signed)
[place], [place], [address] Phone: [phone]   Fax:  [phone]  Physical Therapy Treatment  Patient Details  Name: Justin Adams MRN: [id number] Date of Birth: June 08, 1946 Referring Provider (PT): [name], MD   Encounter Date: 08/05/2019  PT End of Session - 08/05/19 1627    Visit Number  27    Number of Visits  36    Date for PT Re-Evaluation  95/28/41   recert performed on 32/4 to extend POC   Authorization Type  MCR AARP    PT Start Time  0335    PT Stop Time  0421    PT Time Calculation (min)  46 min    Activity Tolerance  Patient tolerated treatment well    Behavior During Therapy  [facility] for tasks assessed/performed       Past Medical History:  Diagnosis Date  . Adenomatous polyp   . Bleeding internal hemorrhoids 10/2017  . Cerebral venous thrombosis   . Grade I diastolic dysfunction 40/03/2724   noted on ECHO   . Hemorrhoids   . History of kidney stones   . Hypercholesteremia   . Hypertension   . Intracranial hemorrhage (HCC)    Right temporal lobe  . LVH (left ventricular hypertrophy) 07/11/2017   Mild, noted on ECHO   . Muscular degeneration   . Nephrolithiasis   . Obesity   . OSA (obstructive sleep apnea)   . Polycythemia   . Prostate cancer ([place]) 2003  . Shingles   . Stroke ([place]) 06/2017   No residual  . Unsteady gait   . Ureteral calculi 02/12/2008   Left    Past Surgical History:  Procedure Laterality Date  . APPENDECTOMY     childhood  . CHOLECYSTECTOMY    . COLONOSCOPY    . HEMORRHOID BANDING  2015  . HOLMIUM LASER APPLICATION Left 36/11/4401   Procedure: HOLMIUM LASER APPLICATION;  Surgeon: Franchot Gallo, MD;  [facility];  Service: Urology;  Laterality: Left;  . IR ANGIO EXTERNAL CAROTID SEL EXT CAROTID UNI R MOD SED  07/10/2017  . IR ANGIO INTRA EXTRACRAN SEL INTERNAL CAROTID BILAT MOD SED  07/10/2017  . IR ANGIO VERTEBRAL SEL VERTEBRAL BILAT MOD SED   07/10/2017  . IR URETERAL STENT LEFT NEW ACCESS W/O SEP NEPHROSTOMY CATH  03/12/2018  . LEFT HEART CATHETERIZATION WITH CORONARY ANGIOGRAM N/A 03/15/2013   Procedure: LEFT HEART CATHETERIZATION WITH CORONARY ANGIOGRAM;  Surgeon: Minus Breeding, MD;  Location: [facility] CATH LAB;  Service: Cardiovascular;  Laterality: N/A;  . NEPHROLITHOTOMY Left 03/12/2018   Procedure: LEFT NEPHROLITHOTOMY PERCUTANEOUS, STENT PLACEMENT;  Surgeon: Franchot Gallo, MD;  [facility];  Service: Urology;  Laterality: Left;  . PROSTATECTOMY    . STONE EXTRACTION WITH BASKET      There were no vitals filed for this visit.  Subjective Assessment - 08/05/19 1617    Subjective  His back is bothering him today, he walked a mile and it started botherin him    Pertinent History  PMH: HTN, CVA 06/2017, prostate Ca 2003,    Patient Stated Goals  improve gait and conditioning    Pain Onset  More than a month ago                       [facility] Adult PT Treatment/Exercise - 08/05/19 0001      Lumbar Exercises: Stretches   Single Knee to Chest Stretch  Right;Left;2 reps;30 seconds  Lower Trunk Rotation  5 reps;10 seconds    Other Lumbar Stretch Exercise  seated pball roll outs for lumbar flexion stretching 10 swc X 10 reps      Lumbar Exercises: Aerobic   Recumbent Bike  10 min L3      Lumbar Exercises: Machines for Strengthening   Leg Press  Shuttle leg press 100 lbs 2X20 reps, did one warmup set of 20 reps with 50 lbs      Lumbar Exercises: Supine   Bridge  20 reps      Modalities   Modalities  Moist Heat      Moist Heat Therapy   Number Minutes Moist Heat  8 Minutes    Moist Heat Location  Lumbar Spine;Shoulder                  PT Long Term Goals - 07/08/19 1140      PT LONG TERM GOAL #1   Title  Pt will report performing HEP, walking program and transition to gym at end of PT    Baseline  compliant with old HEP but progressed HEP today due to progress    Time  6    Period   Weeks    Status  Achieved      PT LONG TERM GOAL #2   Title  Pt will increaese overall leg strength to at least 4+ to 5- overall to improve function    Baseline  Met 05/12/19    Time  6    Period  Weeks    Status  Achieved      PT LONG TERM GOAL #3   Title  Will improve BERG to at least 53 to show improved balance    Baseline  now 54 on 12/2    Time  6    Period  Weeks    Status  Achieved      PT LONG TERM GOAL #4   Title  Will improve FGA to at least 29 to lower falls risk    Baseline  27    Time  6    Period  Weeks    Status  On-going      PT LONG TERM GOAL #5   Title  Will improve 6 minute walk test to 1560 ft    Baseline  .27 miles    Time  6    Period  Weeks    Status  On-going            Plan - 08/05/19 1628    Clinical Impression Statement  he was having back pain today so backed off on strengthening program and instead performed more stretching and mobility work today to tolerance. Trialed heat at end to further reduce pain and tightness.    Comorbidities  PMH: HTN, CVA 06/2017, prostate Ca 2003,    Examination-Activity Limitations  Bend;Squat;Stairs;Stand;Lift;Locomotion Level    Examination-Participation Restrictions  Community Activity;Shop;Yard Work    Rehab Potential  Good    PT Frequency  2x / week    PT Duration  6 weeks    PT Treatment/Interventions  Aquatic Therapy;Cryotherapy;Electrical Stimulation;Moist Heat;Gait training;Stair training;Functional mobility training;Therapeutic activities;Therapeutic exercise;Balance training;Neuromuscular re-education;Manual techniques;Passive range of motion;Taping;Joint Manipulations    PT Next Visit Plan  lumbar stretch and strength, needs gait, balance, LE strength and conditioning    PT Home Exercise Plan  Access Code: [email or web address], added parial lunges, sit to stands, step ups fwd, lat, retro, also added LTR, SKTC, pelvic tilts, bridges, hip  flexor stretch, HSS    Consulted and Agree with Plan of Care  Patient        Patient will benefit from skilled therapeutic intervention in order to improve the following deficits and impairments:  Abnormal gait, Decreased activity tolerance, Decreased balance, Decreased endurance, Decreased strength, Difficulty walking  Visit Diagnosis: Unsteadiness on feet  Muscle weakness (generalized)  Chronic bilateral low back pain without sciatica  Hemiplegia and hemiparesis following nontraumatic intracerebral hemorrhage affecting left non-dominant side (HCC)  Other symptoms and signs involving the nervous system     Problem List Patient Active Problem List   Diagnosis Date Noted  . Calculus of kidney 03/12/2018  . Bleeding internal hemorrhoids 10/09/2017  . Sleep apnea 09/22/2017  . History of adenomatous polyp of colon 09/18/2017  . Prostate cancer ([place]) 09/18/2017  . Essential hypertension 08/26/2017  . Hyperlipidemia 08/26/2017  . Cerebral venous thrombosis 07/13/2017  . Intracerebral hemorrhage 07/10/2017  . ICH (intracerebral hemorrhage) ([place]) 07/10/2017  . Unstable angina ([place]) 03/12/2013    Debbe Odea, PT,DPT 08/05/2019, 4:30 PM  [facility] Physical Therapy [address] [place], [place], [address] Phone: [phone]   Fax:  [phone]  Name: Justin Adams MRN: [id number] Date of Birth: January 18, 1946

## 2019-08-10 ENCOUNTER — Ambulatory Visit (INDEPENDENT_AMBULATORY_CARE_PROVIDER_SITE_OTHER): Payer: Medicare Other | Admitting: Physical Therapy

## 2019-08-10 ENCOUNTER — Other Ambulatory Visit: Payer: Self-pay

## 2019-08-10 DIAGNOSIS — M6281 Muscle weakness (generalized): Secondary | ICD-10-CM | POA: Diagnosis not present

## 2019-08-10 DIAGNOSIS — R2681 Unsteadiness on feet: Secondary | ICD-10-CM

## 2019-08-10 DIAGNOSIS — I69154 Hemiplegia and hemiparesis following nontraumatic intracerebral hemorrhage affecting left non-dominant side: Secondary | ICD-10-CM

## 2019-08-10 DIAGNOSIS — R29818 Other symptoms and signs involving the nervous system: Secondary | ICD-10-CM | POA: Diagnosis not present

## 2019-08-10 DIAGNOSIS — M545 Low back pain: Secondary | ICD-10-CM | POA: Diagnosis not present

## 2019-08-10 DIAGNOSIS — G8929 Other chronic pain: Secondary | ICD-10-CM

## 2019-08-10 NOTE — Therapy (Signed)
[place], [place], [address] Phone: [phone]   Fax:  [phone]  Physical Therapy Treatment  Patient Details  Name: Justin Adams MRN: [id number] Date of Birth: 11-14-1945 Referring Provider (PT): [name], MD   Encounter Date: 08/10/2019  PT End of Session - 08/10/19 1524    Visit Number  28    Number of Visits  36    Date for PT Re-Evaluation  56/43/32   recert performed on 95/1 to extend POC   Authorization Type  MCR AARP    PT Start Time  1315    PT Stop Time  1355    PT Time Calculation (min)  40 min    Activity Tolerance  Patient tolerated treatment well    Behavior During Therapy  [facility] for tasks assessed/performed       Past Medical History:  Diagnosis Date  . Adenomatous polyp   . Bleeding internal hemorrhoids 10/2017  . Cerebral venous thrombosis   . Grade I diastolic dysfunction 88/41/6606   noted on ECHO   . Hemorrhoids   . History of kidney stones   . Hypercholesteremia   . Hypertension   . Intracranial hemorrhage (HCC)    Right temporal lobe  . LVH (left ventricular hypertrophy) 07/11/2017   Mild, noted on ECHO   . Muscular degeneration   . Nephrolithiasis   . Obesity   . OSA (obstructive sleep apnea)   . Polycythemia   . Prostate cancer ([place]) 2003  . Shingles   . Stroke ([place]) 06/2017   No residual  . Unsteady gait   . Ureteral calculi 02/12/2008   Left    Past Surgical History:  Procedure Laterality Date  . APPENDECTOMY     childhood  . CHOLECYSTECTOMY    . COLONOSCOPY    . HEMORRHOID BANDING  2015  . HOLMIUM LASER APPLICATION Left 30/06/6008   Procedure: HOLMIUM LASER APPLICATION;  Surgeon: Franchot Gallo, MD;  [facility];  Service: Urology;  Laterality: Left;  . IR ANGIO EXTERNAL CAROTID SEL EXT CAROTID UNI R MOD SED  07/10/2017  . IR ANGIO INTRA EXTRACRAN SEL INTERNAL CAROTID BILAT MOD SED  07/10/2017  . IR ANGIO VERTEBRAL SEL VERTEBRAL BILAT MOD SED   07/10/2017  . IR URETERAL STENT LEFT NEW ACCESS W/O SEP NEPHROSTOMY CATH  03/12/2018  . LEFT HEART CATHETERIZATION WITH CORONARY ANGIOGRAM N/A 03/15/2013   Procedure: LEFT HEART CATHETERIZATION WITH CORONARY ANGIOGRAM;  Surgeon: Minus Breeding, MD;  Location: [facility] CATH LAB;  Service: Cardiovascular;  Laterality: N/A;  . NEPHROLITHOTOMY Left 03/12/2018   Procedure: LEFT NEPHROLITHOTOMY PERCUTANEOUS, STENT PLACEMENT;  Surgeon: Franchot Gallo, MD;  [facility];  Service: Urology;  Laterality: Left;  . PROSTATECTOMY    . STONE EXTRACTION WITH BASKET      There were no vitals filed for this visit.  Subjective Assessment - 08/10/19 1502    Subjective  no pain upon arrival, he will leave for the beach for the next couple of weeks after today    Pertinent History  PMH: HTN, CVA 06/2017, prostate Ca 2003,    Patient Stated Goals  improve gait and conditioning    Pain Onset  More than a month ago                       [facility] Adult PT Treatment/Exercise - 08/10/19 0001      Lumbar Exercises: Stretches   Other Lumbar Stretch Exercise  Standing L stretch  10 sec X 10 reps      Lumbar Exercises: Aerobic   Recumbent Bike  10 min L2      Lumbar Exercises: Machines for Strengthening   Leg Press  Shuttle leg press 100 lbs 2X20 reps, did one warmup set of 20 reps with 50 lbs      Lumbar Exercises: Standing   Row  20 reps    Theraband Level (Row)  Level 3 (Green)      Knee/Hip Exercises: Standing   Hip Abduction  Both;15 reps    Abduction Limitations  5    Hip Extension  Both;15 reps    Extension Limitations  5      Knee/Hip Exercises: Seated   Long Arc Quad  Both;20 reps    Long Arc Quad Weight  5 lbs.    Marching  Both;15 reps    Marching Limitations  5 LBS    Hamstring Curl  Both;20 reps    Hamstring Limitations  L3    Sit to Sand  2 sets;5 reps;without UE support   sitting on airex pad                 PT Long Term Goals - 07/08/19 1140      PT LONG  TERM GOAL #1   Title  Pt will report performing HEP, walking program and transition to gym at end of PT    Baseline  compliant with old HEP but progressed HEP today due to progress    Time  6    Period  Weeks    Status  Achieved      PT LONG TERM GOAL #2   Title  Pt will increaese overall leg strength to at least 4+ to 5- overall to improve function    Baseline  Met 05/12/19    Time  6    Period  Weeks    Status  Achieved      PT LONG TERM GOAL #3   Title  Will improve BERG to at least 53 to show improved balance    Baseline  now 54 on 12/2    Time  6    Period  Weeks    Status  Achieved      PT LONG TERM GOAL #4   Title  Will improve FGA to at least 29 to lower falls risk    Baseline  27    Time  6    Period  Weeks    Status  On-going      PT LONG TERM GOAL #5   Title  Will improve 6 minute walk test to 1560 ft    Baseline  .27 miles    Time  6    Period  Weeks    Status  On-going            Plan - 08/10/19 1525    Clinical Impression Statement  Still having some shoulder pain so held off of most of the upper body strength and focused more on LE strength and endurance with good tolerance and without complaints. He will miss 2 weeks of PT due to leaving for vacation and PT will assess him when he comes back    Comorbidities  PMH: HTN, CVA 06/2017, prostate Ca 2003,    Examination-Activity Limitations  Bend;Squat;Stairs;Stand;Lift;Locomotion Level    Examination-Participation Restrictions  Community Activity;Shop;Yard Work    Rehab Potential  Good    PT Frequency  2x / week    PT  Duration  6 weeks    PT Treatment/Interventions  Aquatic Therapy;Cryotherapy;Electrical Stimulation;Moist Heat;Gait training;Stair training;Functional mobility training;Therapeutic activities;Therapeutic exercise;Balance training;Neuromuscular re-education;Manual techniques;Passive range of motion;Taping;Joint Manipulations    PT Next Visit Plan  lumbar stretch and strength, needs gait,  balance, LE strength and conditioning    PT Home Exercise Plan  Access Code: [email or web address], added parial lunges, sit to stands, step ups fwd, lat, retro, also added LTR, SKTC, pelvic tilts, bridges, hip flexor stretch, HSS    Consulted and Agree with Plan of Care  Patient       Patient will benefit from skilled therapeutic intervention in order to improve the following deficits and impairments:  Abnormal gait, Decreased activity tolerance, Decreased balance, Decreased endurance, Decreased strength, Difficulty walking  Visit Diagnosis: Unsteadiness on feet  Muscle weakness (generalized)  Chronic bilateral low back pain without sciatica  Hemiplegia and hemiparesis following nontraumatic intracerebral hemorrhage affecting left non-dominant side (HCC)  Other symptoms and signs involving the nervous system     Problem List Patient Active Problem List   Diagnosis Date Noted  . Calculus of kidney 03/12/2018  . Bleeding internal hemorrhoids 10/09/2017  . Sleep apnea 09/22/2017  . History of adenomatous polyp of colon 09/18/2017  . Prostate cancer ([place]) 09/18/2017  . Essential hypertension 08/26/2017  . Hyperlipidemia 08/26/2017  . Cerebral venous thrombosis 07/13/2017  . Intracerebral hemorrhage 07/10/2017  . ICH (intracerebral hemorrhage) ([place]) 07/10/2017  . Unstable angina ([place]) 03/12/2013    Debbe Odea, PT,DPT 08/10/2019, 3:26 PM  [facility] Physical Therapy [address] [place], [place], [address] Phone: [phone]   Fax:  [phone]  Name: EARVIN BLAZIER MRN: [id number] Date of Birth: 10-Jul-1945

## 2019-08-11 ENCOUNTER — Other Ambulatory Visit: Payer: Self-pay

## 2019-08-11 NOTE — Telephone Encounter (Signed)
Has this been completed?  Sending to clinical staff for review: Okay to sign/close encounter or is further follow up needed? 

## 2019-08-11 NOTE — Telephone Encounter (Signed)
Yes, can close encounter.

## 2019-08-20 DIAGNOSIS — H52201 Unspecified astigmatism, right eye: Secondary | ICD-10-CM | POA: Diagnosis not present

## 2019-08-20 DIAGNOSIS — H353112 Nonexudative age-related macular degeneration, right eye, intermediate dry stage: Secondary | ICD-10-CM | POA: Diagnosis not present

## 2019-08-20 DIAGNOSIS — Z961 Presence of intraocular lens: Secondary | ICD-10-CM | POA: Diagnosis not present

## 2019-08-20 DIAGNOSIS — H353223 Exudative age-related macular degeneration, left eye, with inactive scar: Secondary | ICD-10-CM | POA: Diagnosis not present

## 2019-08-24 ENCOUNTER — Ambulatory Visit (INDEPENDENT_AMBULATORY_CARE_PROVIDER_SITE_OTHER): Payer: Medicare Other | Admitting: Physical Therapy

## 2019-08-24 ENCOUNTER — Other Ambulatory Visit: Payer: Self-pay

## 2019-08-24 DIAGNOSIS — R2681 Unsteadiness on feet: Secondary | ICD-10-CM | POA: Diagnosis not present

## 2019-08-24 DIAGNOSIS — M545 Low back pain, unspecified: Secondary | ICD-10-CM

## 2019-08-24 DIAGNOSIS — I69154 Hemiplegia and hemiparesis following nontraumatic intracerebral hemorrhage affecting left non-dominant side: Secondary | ICD-10-CM | POA: Diagnosis not present

## 2019-08-24 DIAGNOSIS — M25511 Pain in right shoulder: Secondary | ICD-10-CM | POA: Diagnosis not present

## 2019-08-24 DIAGNOSIS — Z79899 Other long term (current) drug therapy: Secondary | ICD-10-CM | POA: Diagnosis not present

## 2019-08-24 DIAGNOSIS — G8929 Other chronic pain: Secondary | ICD-10-CM | POA: Diagnosis not present

## 2019-08-24 DIAGNOSIS — I10 Essential (primary) hypertension: Secondary | ICD-10-CM | POA: Diagnosis not present

## 2019-08-24 DIAGNOSIS — M6281 Muscle weakness (generalized): Secondary | ICD-10-CM

## 2019-08-24 DIAGNOSIS — M25512 Pain in left shoulder: Secondary | ICD-10-CM | POA: Diagnosis not present

## 2019-08-24 NOTE — Therapy (Signed)
[place], [place], [address] Phone: [phone]   Fax:  [phone]  Physical Therapy Treatment  Patient Details  Name: Justin Adams MRN: [id number] Date of Birth: June 13, 1945 Referring Provider (PT): [name], MD   Encounter Date: 08/24/2019  PT End of Session - 08/24/19 0904    Visit Number  29    Number of Visits  36    Date for PT Re-Evaluation  78/24/23   recert performed on 53/6 to extend POC   Authorization Type  MCR AARP    PT Start Time  0800    PT Stop Time  0845    PT Time Calculation (min)  45 min    Activity Tolerance  Patient tolerated treatment well    Behavior During Therapy  [facility] for tasks assessed/performed       Past Medical History:  Diagnosis Date  . Adenomatous polyp   . Bleeding internal hemorrhoids 10/2017  . Cerebral venous thrombosis   . Grade I diastolic dysfunction 14/43/1540   noted on ECHO   . Hemorrhoids   . History of kidney stones   . Hypercholesteremia   . Hypertension   . Intracranial hemorrhage (HCC)    Right temporal lobe  . LVH (left ventricular hypertrophy) 07/11/2017   Mild, noted on ECHO   . Muscular degeneration   . Nephrolithiasis   . Obesity   . OSA (obstructive sleep apnea)   . Polycythemia   . Prostate cancer ([place]) 2003  . Shingles   . Stroke ([place]) 06/2017   No residual  . Unsteady gait   . Ureteral calculi 02/12/2008   Left    Past Surgical History:  Procedure Laterality Date  . APPENDECTOMY     childhood  . CHOLECYSTECTOMY    . COLONOSCOPY    . HEMORRHOID BANDING  2015  . HOLMIUM LASER APPLICATION Left 01/13/7618   Procedure: HOLMIUM LASER APPLICATION;  Surgeon: Franchot Gallo, MD;  Location: [facility];  Service: Urology;  Laterality: Left;  . IR ANGIO EXTERNAL CAROTID SEL EXT CAROTID UNI R MOD SED  07/10/2017  . IR ANGIO INTRA EXTRACRAN SEL INTERNAL CAROTID BILAT MOD SED  07/10/2017  . IR ANGIO VERTEBRAL SEL VERTEBRAL BILAT MOD SED   07/10/2017  . IR URETERAL STENT LEFT NEW ACCESS W/O SEP NEPHROSTOMY CATH  03/12/2018  . LEFT HEART CATHETERIZATION WITH CORONARY ANGIOGRAM N/A 03/15/2013   Procedure: LEFT HEART CATHETERIZATION WITH CORONARY ANGIOGRAM;  Surgeon: Minus Breeding, MD;  Location: [facility] C[facility] LAB;  Service: Cardiovascular;  Laterality: N/A;  . NEPHROLITHOTOMY Left 03/12/2018   Procedure: LEFT NEPHROLITHOTOMY PERCUTANEOUS, STENT PLACEMENT;  Surgeon: Franchot Gallo, MD;  Location: [facility];  Service: Urology;  Laterality: Left;  . PROSTATECTOMY    . STONE EXTRACTION WITH BASKET      There were no vitals filed for this visit.  Subjective Assessment - 08/24/19 0830    Subjective  He has been on vacation, he is overall feeling pretty good today, he has lost 13 lbs overall. He will follow up with PCP today    Pertinent History  PMH: HTN, CVA 06/2017, prostate Ca 2003,    Patient Stated Goals  improve gait and conditioning    Pain Onset  More than a month ago          [facility] Adult PT Treatment/Exercise - 08/24/19 0001      High Level Balance   High Level Balance Comments  SLS 10-20 sec X 5 bilat, tandem walk and  lateral walk on foam beam up/down X 3 reps, marches on airex foam pad X 10 ea      Lumbar Exercises: Stretches   Other Lumbar Stretch Exercise  Standing L stretch 10 sec X 10 reps      Lumbar Exercises: Aerobic   Recumbent Bike  8 min L2      Lumbar Exercises: Machines for Strengthening   Leg Press  Shuttle leg press 100 lbs 2X20 reps      Lumbar Exercises: Standing   Row  20 reps    Theraband Level (Row)  Level 3 (Green)      Knee/Hip Exercises: Standing   Heel Raises Limitations  heel toe raises X 20 bilat    Knee Flexion  Both;20 reps    Knee Flexion Limitations  5 lbs    Hip Abduction  Both;20 reps    Abduction Limitations  5    Hip Extension  Both;20 reps    Extension Limitations  5      Knee/Hip Exercises: Seated   Long Arc Quad  Both;20 reps    Long Arc Quad Weight  5 lbs.    Sit to  Sand  2 sets;5 reps;without UE support   on airex pad                 PT Long Term Goals - 07/08/19 1140      PT LONG TERM GOAL #1   Title  Pt will report performing HEP, walking program and transition to gym at end of PT    Baseline  compliant with old HEP but progressed HEP today due to progress    Time  6    Period  Weeks    Status  Achieved      PT LONG TERM GOAL #2   Title  Pt will increaese overall leg strength to at least 4+ to 5- overall to improve function    Baseline  Met 05/12/19    Time  6    Period  Weeks    Status  Achieved      PT LONG TERM GOAL #3   Title  Will improve BERG to at least 53 to show improved balance    Baseline  now 54 on 12/2    Time  6    Period  Weeks    Status  Achieved      PT LONG TERM GOAL #4   Title  Will improve FGA to at least 29 to lower falls risk    Baseline  27    Time  6    Period  Weeks    Status  On-going      PT LONG TERM GOAL #5   Title  Will improve 6 minute walk test to 1560 ft    Baseline  .27 miles    Time  6    Period  Weeks    Status  On-going            Plan - 08/24/19 0905    Clinical Impression Statement  He had good tolerance to session without complaints of back pain, he does have some continued shoulder pain. His balance has much improved. He plans to start going back to the gym and once he gets independent with that PT will discharge    Comorbidities  PMH: HTN, CVA 06/2017, prostate Ca 2003,    Examination-Activity Limitations  Bend;Squat;Stairs;Stand;Lift;Locomotion Level    Examination-Participation Restrictions  Community Activity;Shop;Yard Work    [employer]  Potential  Good    PT Frequency  2x / week    PT Duration  6 weeks    PT Treatment/Interventions  Aquatic Therapy;Cryotherapy;Electrical Stimulation;Moist Heat;Gait training;Stair training;Functional mobility training;Therapeutic activities;Therapeutic exercise;Balance training;Neuromuscular re-education;Manual techniques;Passive range  of motion;Taping;Joint Manipulations    PT Next Visit Plan  lumbar stretch and strength, needs gait, balance, LE strength and conditioning    PT Home Exercise Plan  Access Code: [email or web address], added parial lunges, sit to stands, step ups fwd, lat, retro, also added LTR, SKTC, pelvic tilts, bridges, hip flexor stretch, HSS    Consulted and Agree with Plan of Care  Patient       Patient will benefit from skilled therapeutic intervention in order to improve the following deficits and impairments:  Abnormal gait, Decreased activity tolerance, Decreased balance, Decreased endurance, Decreased strength, Difficulty walking  Visit Diagnosis: Unsteadiness on feet  Muscle weakness (generalized)  Chronic bilateral low back pain without sciatica  Hemiplegia and hemiparesis following nontraumatic intracerebral hemorrhage affecting left non-dominant side [facility])     Problem List Patient Active Problem List   Diagnosis Date Noted  . Calculus of kidney 03/12/2018  . Bleeding internal hemorrhoids 10/09/2017  . Sleep apnea 09/22/2017  . History of adenomatous polyp of colon 09/18/2017  . Prostate cancer ([place]) 09/18/2017  . Essential hypertension 08/26/2017  . Hyperlipidemia 08/26/2017  . Cerebral venous thrombosis 07/13/2017  . Intracerebral hemorrhage 07/10/2017  . ICH (intracerebral hemorrhage) ([place]) 07/10/2017  . Unstable angina ([place]) 03/12/2013    Debbe Odea, PT,DPT 08/24/2019, 9:09 AM  [facility] Physical Therapy [address] [place], [place], [address] Phone: [phone]   Fax:  [phone]  Name: JESIEL GARATE MRN: [id number] Date of Birth: 1945-07-24

## 2019-08-26 ENCOUNTER — Other Ambulatory Visit: Payer: Self-pay

## 2019-08-26 ENCOUNTER — Ambulatory Visit (INDEPENDENT_AMBULATORY_CARE_PROVIDER_SITE_OTHER): Payer: Medicare Other | Admitting: Physical Therapy

## 2019-08-26 ENCOUNTER — Encounter: Payer: Self-pay | Admitting: Physical Therapy

## 2019-08-26 DIAGNOSIS — M6281 Muscle weakness (generalized): Secondary | ICD-10-CM | POA: Diagnosis not present

## 2019-08-26 DIAGNOSIS — G8929 Other chronic pain: Secondary | ICD-10-CM

## 2019-08-26 DIAGNOSIS — I69154 Hemiplegia and hemiparesis following nontraumatic intracerebral hemorrhage affecting left non-dominant side: Secondary | ICD-10-CM | POA: Diagnosis not present

## 2019-08-26 DIAGNOSIS — R2681 Unsteadiness on feet: Secondary | ICD-10-CM

## 2019-08-26 DIAGNOSIS — M545 Low back pain: Secondary | ICD-10-CM | POA: Diagnosis not present

## 2019-08-26 NOTE — Therapy (Addendum)
[facility] Physical Therapy [address] [place], [place], [address] Phone: [phone]   Fax:  [phone]  Physical Therapy Treatment/Progress note Progress Note reporting period 07/08/19 to 08/26/19  See below for objective and subjective measurements relating to patients progress with PT.   Patient Details  Name: Justin Adams MRN: [id number] Date of Birth: September 08, 1945 Referring Provider (PT): [name], MD   Encounter Date: 08/26/2019  PT End of Session - 08/26/19 0839    Visit Number  30    Number of Visits  36    Date for PT Re-Evaluation  83/81/84   recert performed on 03/7 to extend POC   Authorization Type  MCR AARP    PT Start Time  0800    PT Stop Time  0845    PT Time Calculation (min)  45 min    Activity Tolerance  Patient tolerated treatment well    Behavior During Therapy  [facility] for tasks assessed/performed       Past Medical History:  Diagnosis Date  . Adenomatous polyp   . Bleeding internal hemorrhoids 10/2017  . Cerebral venous thrombosis   . Grade I diastolic dysfunction 54/36/0677   noted on ECHO   . Hemorrhoids   . History of kidney stones   . Hypercholesteremia   . Hypertension   . Intracranial hemorrhage (HCC)    Right temporal lobe  . LVH (left ventricular hypertrophy) 07/11/2017   Mild, noted on ECHO   . Muscular degeneration   . Nephrolithiasis   . Obesity   . OSA (obstructive sleep apnea)   . Polycythemia   . Prostate cancer ([place]) 2003  . Shingles   . Stroke ([place]) 06/2017   No residual  . Unsteady gait   . Ureteral calculi 02/12/2008   Left    Past Surgical History:  Procedure Laterality Date  . APPENDECTOMY     childhood  . CHOLECYSTECTOMY    . COLONOSCOPY    . HEMORRHOID BANDING  2015  . HOLMIUM LASER APPLICATION Left 08/12/350   Procedure: HOLMIUM LASER APPLICATION;  Surgeon: Franchot Gallo, MD;  [facility]: [facility];  Service: Urology;  Laterality: Left;  . IR ANGIO EXTERNAL CAROTID SEL EXT  CAROTID UNI R MOD SED  07/10/2017  . IR ANGIO INTRA EXTRACRAN SEL INTERNAL CAROTID BILAT MOD SED  07/10/2017  . IR ANGIO VERTEBRAL SEL VERTEBRAL BILAT MOD SED  07/10/2017  . IR URETERAL STENT LEFT NEW ACCESS W/O SEP NEPHROSTOMY CATH  03/12/2018  . LEFT HEART CATHETERIZATION WITH CORONARY ANGIOGRAM N/A 03/15/2013   Procedure: LEFT HEART CATHETERIZATION WITH CORONARY ANGIOGRAM;  Surgeon: Minus Breeding, MD;  Location[facility] CATH LAB;  Service: Cardiovascular;  Laterality: N/A;  . NEPHROLITHOTOMY Left 03/12/2018   Procedure: LEFT NEPHROLITHOTOMY PERCUTANEOUS, STENT PLACEMENT;  Surgeon: Franchot Gallo, MD;  Location: [facility];  Service: Urology;  Laterality: Left;  . PROSTATECTOMY    . STONE EXTRACTION WITH BASKET      There were no vitals filed for this visit.  Subjective Assessment - 08/26/19 0805    Subjective  doing well    Pertinent History  PMH: HTN, CVA 06/2017, prostate Ca 2003,    Patient Stated Goals  improve gait and conditioning    Currently in Pain?  No/denies    Pain Onset  More than a month ago         [facility] PT Assessment - 08/26/19 0803      Single Leg Stance   Comments  30 second avg today bilat showing  improved balance      [facility] Adult PT Treatment/Exercise - 08/26/19 0803      High Level Balance   High Level Balance Comments  SLS 30 sec X 2 reps bilat      Lumbar Exercises: Stretches   Other Lumbar Stretch Exercise  seated lumbar Pball rollouts into flexion fwd and diagonal 5 sec X 5 ea      Lumbar Exercises: Aerobic   Recumbent Bike  8 min L2      Lumbar Exercises: Machines for Strengthening   Leg Press  Shuttle leg press 100 lbs 2X20 reps      Lumbar Exercises: Standing   Functional Squats  10 reps;2 seconds   3 sets, TRX   Row  10 reps   3 sets; TRX     Knee/Hip Exercises: Standing   Knee Flexion  Both;20 reps    Knee Flexion Limitations  5 lbs    Hip Flexion  Both;20 reps    Hip Flexion Limitations  5    Hip Abduction  Both;20 reps    Abduction  Limitations  5    Hip Extension  Both;20 reps    Extension Limitations  5    Lateral Step Up  Both;15 reps;Hand Hold: 1;Step Height: 8"    Forward Step Up  Both;15 reps;Hand Hold: 1;Step Height: 8"                  PT Long Term Goals - 07/08/19 1140      PT LONG TERM GOAL #1   Title  Pt will report performing HEP, walking program and transition to gym at end of PT    Baseline  compliant with old HEP but progressed HEP today due to progress    Time  6    Period  Weeks    Status  Achieved      PT LONG TERM GOAL #2   Title  Pt will increaese overall leg strength to at least 4+ to 5- overall to improve function    Baseline  Met 05/12/19    Time  6    Period  Weeks    Status  Achieved      PT LONG TERM GOAL #3   Title  Will improve BERG to at least 53 to show improved balance    Baseline  now 54 on 12/2    Time  6    Period  Weeks    Status  Achieved      PT LONG TERM GOAL #4   Title  Will improve FGA to at least 29 to lower falls risk    Baseline  27    Time  6    Period  Weeks    Status  On-going      PT LONG TERM GOAL #5   Title  Will improve 6 minute walk test to 1560 ft    Baseline  .27 miles    Time  6    Period  Weeks    Status  On-going            Plan - 08/26/19 0856    Clinical Impression Statement  Showed improvements in balance and is now able to hold SLS for 30 sec. Overall he has met most of his goals except for FGA and dynamic gait endurance. PT will continue to address these deficits as able and plan to transition him to HEP and community gym over next 3-4 weeks.    Comorbidities  PMH: HTN, CVA 06/2017, prostate Ca 2003,    Examination-Activity Limitations  Bend;Squat;Stairs;Stand;Lift;Locomotion Level    Examination-Participation Restrictions  Community Activity;Shop;Yard Work    Rehab Potential  Good    PT Frequency  2x / week    PT Duration  6 weeks    PT Treatment/Interventions  Aquatic Therapy;Cryotherapy;Electrical  Stimulation;Moist Heat;Gait training;Stair training;Functional mobility training;Therapeutic activities;Therapeutic exercise;Balance training;Neuromuscular re-education;Manual techniques;Passive range of motion;Taping;Joint Manipulations    PT Next Visit Plan  lumbar stretch and strength, needs gait, balance, LE strength and conditioning    PT Home Exercise Plan  Access Code: [email or web address], added parial lunges, sit to stands, step ups fwd, lat, retro, also added LTR, SKTC, pelvic tilts, bridges, hip flexor stretch, HSS    Consulted and Agree with Plan of Care  Patient       Patient will benefit from skilled therapeutic intervention in order to improve the following deficits and impairments:  Abnormal gait, Decreased activity tolerance, Decreased balance, Decreased endurance, Decreased strength, Difficulty walking  Visit Diagnosis: Unsteadiness on feet  Muscle weakness (generalized)  Chronic bilateral low back pain without sciatica  Hemiplegia and hemiparesis following nontraumatic intracerebral hemorrhage affecting left non-dominant side [facility])     Problem List Patient Active Problem List   Diagnosis Date Noted  . Calculus of kidney 03/12/2018  . Bleeding internal hemorrhoids 10/09/2017  . Sleep apnea 09/22/2017  . History of adenomatous polyp of colon 09/18/2017  . Prostate cancer ([place]) 09/18/2017  . Essential hypertension 08/26/2017  . Hyperlipidemia 08/26/2017  . Cerebral venous thrombosis 07/13/2017  . Intracerebral hemorrhage 07/10/2017  . ICH (intracerebral hemorrhage) ([place]) 07/10/2017  . Unstable angina ([place]) 03/12/2013    Debbe Odea, PT,DPT 08/26/2019, 8:58 AM  [facility] Physical Therapy [address] [place], [place], [address] Phone: [phone]   Fax:  [phone]  Name: DASCHEL ROUGHTON MRN: [id number] Date of Birth: 1946-02-12

## 2019-08-30 DIAGNOSIS — H353132 Nonexudative age-related macular degeneration, bilateral, intermediate dry stage: Secondary | ICD-10-CM | POA: Diagnosis not present

## 2019-08-30 DIAGNOSIS — H43812 Vitreous degeneration, left eye: Secondary | ICD-10-CM | POA: Diagnosis not present

## 2019-08-30 DIAGNOSIS — H33312 Horseshoe tear of retina without detachment, left eye: Secondary | ICD-10-CM | POA: Diagnosis not present

## 2019-08-30 DIAGNOSIS — H353221 Exudative age-related macular degeneration, left eye, with active choroidal neovascularization: Secondary | ICD-10-CM | POA: Diagnosis not present

## 2019-08-31 ENCOUNTER — Ambulatory Visit (INDEPENDENT_AMBULATORY_CARE_PROVIDER_SITE_OTHER): Payer: Medicare Other | Admitting: Physical Therapy

## 2019-08-31 ENCOUNTER — Other Ambulatory Visit: Payer: Self-pay

## 2019-08-31 DIAGNOSIS — G8929 Other chronic pain: Secondary | ICD-10-CM

## 2019-08-31 DIAGNOSIS — M545 Low back pain, unspecified: Secondary | ICD-10-CM

## 2019-08-31 DIAGNOSIS — M6281 Muscle weakness (generalized): Secondary | ICD-10-CM | POA: Diagnosis not present

## 2019-08-31 DIAGNOSIS — I69154 Hemiplegia and hemiparesis following nontraumatic intracerebral hemorrhage affecting left non-dominant side: Secondary | ICD-10-CM

## 2019-08-31 DIAGNOSIS — R2681 Unsteadiness on feet: Secondary | ICD-10-CM | POA: Diagnosis not present

## 2019-08-31 NOTE — Therapy (Signed)
[facility] [address] [place], [place], [address] Phone: [phone]   Fax:  [phone]  Physical Therapy Treatment/Recert/Progress note Progress Note reporting period 07/08/19 to 08/31/19  See below for objective and subjective measurements relating to patients progress with PT.   Patient Details  Name: Justin Adams MRN: [id number] Date of Birth: 1946-02-23 Referring Provider (PT): [name], MD   Encounter Date: 08/31/2019  PT End of Session - 08/31/19 0853    Visit Number  31    Number of Visits  36    Date for PT Re-Evaluation  51/02/58   recert on 11/03/76   Authorization Type  MCR AARP    PT Start Time  0800    PT Stop Time  0845    PT Time Calculation (min)  45 min    Activity Tolerance  Patient tolerated treatment well    Behavior During Therapy  [facility] for tasks assessed/performed       Past Medical History:  Diagnosis Date  . Adenomatous polyp   . Bleeding internal hemorrhoids 10/2017  . Cerebral venous thrombosis   . Grade I diastolic dysfunction 24/23/5361   noted on ECHO   . Hemorrhoids   . History of kidney stones   . Hypercholesteremia   . Hypertension   . Intracranial hemorrhage (HCC)    Right temporal lobe  . LVH (left ventricular hypertrophy) 07/11/2017   Mild, noted on ECHO   . Muscular degeneration   . Nephrolithiasis   . Obesity   . OSA (obstructive sleep apnea)   . Polycythemia   . Prostate cancer ([place]) 2003  . Shingles   . Stroke ([place]) 06/2017   No residual  . Unsteady gait   . Ureteral calculi 02/12/2008   Left    Past Surgical History:  Procedure Laterality Date  . APPENDECTOMY     childhood  . CHOLECYSTECTOMY    . COLONOSCOPY    . HEMORRHOID BANDING  2015  . HOLMIUM LASER APPLICATION Left 44/08/1538   Procedure: HOLMIUM LASER APPLICATION;  Surgeon: Franchot Gallo, MD;  Location: [facility];  Service: Urology;  Laterality: Left;  . IR ANGIO EXTERNAL CAROTID SEL EXT CAROTID UNI R  MOD SED  07/10/2017  . IR ANGIO INTRA EXTRACRAN SEL INTERNAL CAROTID BILAT MOD SED  07/10/2017  . IR ANGIO VERTEBRAL SEL VERTEBRAL BILAT MOD SED  07/10/2017  . IR URETERAL STENT LEFT NEW ACCESS W/O SEP NEPHROSTOMY CATH  03/12/2018  . LEFT HEART CATHETERIZATION WITH CORONARY ANGIOGRAM N/A 03/15/2013   Procedure: LEFT HEART CATHETERIZATION WITH CORONARY ANGIOGRAM;  Surgeon: Minus Breeding, MD;  Location: [facility] CATH LAB;  Service: Cardiovascular;  Laterality: N/A;  . NEPHROLITHOTOMY Left 03/12/2018   Procedure: LEFT NEPHROLITHOTOMY PERCUTANEOUS, STENT PLACEMENT;  Surgeon: Franchot Gallo, MD;  [facility];  Service: Urology;  Laterality: Left;  . PROSTATECTOMY    . STONE EXTRACTION WITH BASKET      There were no vitals filed for this visit.  Subjective Assessment - 08/31/19 0850    Subjective  some mild shoulder pain but his back has been feeling better and he does not notice any pain when he gets out of bed in the morning    Pertinent History  PMH: HTN, CVA 06/2017, prostate Ca 2003,    Patient Stated Goals  improve gait and conditioning    Pain Onset  More than a month ago         [facility] PT Assessment - 08/31/19 0001  Assessment   Medical Diagnosis  Unsteady Gait, LBP    Referring Provider (PT)  Stoneking, Hal, MD      6 Minute walk- Post Test   6 Minute Walk Post Test  yes    HR (bpm)  98    02 Sat (%RA)  98 %    Modified Borg Scale for Dyspnea  2- Mild shortness of breath      6 minute walk test results    Aerobic Endurance Distance Walked  8921    Endurance additional comments  .29 miles      Functional Gait  Assessment   Gait assessed   Yes    Gait Level Surface  Walks 20 ft in less than 5.5 sec, no assistive devices, good speed, no evidence for imbalance, normal gait pattern, deviates no more than 6 in outside of the 12 in walkway width.    Change in Gait Speed  Able to smoothly change walking speed without loss of balance or gait deviation. Deviate no more than 6 in  outside of the 12 in walkway width.    Gait with Horizontal Head Turns  Performs head turns smoothly with no change in gait. Deviates no more than 6 in outside 12 in walkway width    Gait with Vertical Head Turns  Performs head turns with no change in gait. Deviates no more than 6 in outside 12 in walkway width.    Gait and Pivot Turn  Pivot turns safely within 3 sec and stops quickly with no loss of balance.    Step Over Obstacle  Is able to step over 2 stacked shoe boxes taped together (9 in total height) without changing gait speed. No evidence of imbalance.    Gait with Narrow Base of Support  Ambulates 7-9 steps.    Gait with Eyes Closed  Walks 20 ft, uses assistive device, slower speed, mild gait deviations, deviates 6-10 in outside 12 in walkway width. Ambulates 20 ft in less than 9 sec but greater than 7 sec.    Ambulating Backwards  Walks 20 ft, no assistive devices, good speed, no evidence for imbalance, normal gait    Steps  Alternating feet, no rail.    Total Score  28                   [facility] Adult PT Treatment/Exercise - 08/31/19 0001      Lumbar Exercises: Aerobic   Recumbent Bike  8 min L2      Lumbar Exercises: Standing   Other Standing Lumbar Exercises  TRX rows and squats 2x10 ea      Knee/Hip Exercises: Seated   Sit to Sand  10 reps;without UE support   with airex pad                 PT Long Term Goals - 08/31/19 0858      PT LONG TERM GOAL #1   Title  Pt will report performing HEP, walking program and transition to gym at end of PT    Baseline  compliant with old HEP but progressed HEP today due to progress and he has not become consistent with going to gym as he just joined.    Time  6    Period  Weeks    Status  On-going      PT LONG TERM GOAL #2   Title  Pt will increaese overall leg strength to at least 4+ to 5- overall to improve function  Baseline  Met 05/12/19    Time  6    Period  Weeks    Status  Achieved      PT LONG TERM  GOAL #3   Title  Will improve BERG to at least 53 to show improved balance    Baseline  now 54 on 12/2    Time  6    Period  Weeks    Status  Achieved      PT LONG TERM GOAL #4   Title  Will improve FGA to at least 29 to lower falls risk    Baseline  improved to 28 on 08/31/19    Time  6    Period  Weeks    Status  On-going      PT LONG TERM GOAL #5   Title  Will improve 6 minute walk test to 1560 ft    Baseline  1544 on 3/23    Time  6    Period  Weeks    Status  On-going            Plan - 08/31/19 0855    Clinical Impression Statement  recert performed today as he is out of POC. He showed progress with his dynamic balance on the FGA assessement as well as progress with gait endurance on 6 minute walk test and did not yet meet his PT goal with this. Overall he has made good progress and his pain is becoming better managed. PT remains medically necessary to address his deficits in LE strength, overall endurance, balance, and to improve his ability with sit to stand from a standard chair or lower. He is currently having to used a raised height chair to stand up if he does not use his arms which cause shoulder pain if he does use his arms. PT anticipates about 4 more weeks of PT needed.    Comorbidities  PMH: HTN, CVA 06/2017, prostate Ca 2003,    Examination-Activity Limitations  Bend;Squat;Stairs;Stand;Lift;Locomotion Level    Examination-Participation Restrictions  Community Activity;Shop;Yard Work    Rehab Potential  Good    PT Frequency  2x / week    PT Duration  6 weeks    PT Treatment/Interventions  Aquatic Therapy;Cryotherapy;Electrical Stimulation;Moist Heat;Gait training;Stair training;Functional mobility training;Therapeutic activities;Therapeutic exercise;Balance training;Neuromuscular re-education;Manual techniques;Passive range of motion;Taping;Joint Manipulations    PT Next Visit Plan  lumbar stretch and strength, needs gait, balance, LE strength and conditioning     PT Home Exercise Plan  Access Code: [email or web address], added parial lunges, sit to stands, step ups fwd, lat, retro, also added LTR, SKTC, pelvic tilts, bridges, hip flexor stretch, HSS    Consulted and Agree with Plan of Care  Patient       Patient will benefit from skilled therapeutic intervention in order to improve the following deficits and impairments:  Abnormal gait, Decreased activity tolerance, Decreased balance, Decreased endurance, Decreased strength, Difficulty walking  Visit Diagnosis: Unsteadiness on feet  Muscle weakness (generalized)  Chronic bilateral low back pain without sciatica  Hemiplegia and hemiparesis following nontraumatic intracerebral hemorrhage affecting left non-dominant side [facility])     Problem List Patient Active Problem List   Diagnosis Date Noted  . Calculus of kidney 03/12/2018  . Bleeding internal hemorrhoids 10/09/2017  . Sleep apnea 09/22/2017  . History of adenomatous polyp of colon 09/18/2017  . Prostate cancer ([place]) 09/18/2017  . Essential hypertension 08/26/2017  . Hyperlipidemia 08/26/2017  . Cerebral venous thrombosis 07/13/2017  . Intracerebral hemorrhage 07/10/2017  . ICH (  intracerebral hemorrhage) ([place]) 07/10/2017  . Unstable angina ([place]) 03/12/2013    Debbe Odea, PT,DPT 08/31/2019, 9:09 AM  [facility] Physical Therapy [address] [place], [place], [address] Phone: [phone]   Fax:  [phone]  Name: TRON FLYTHE MRN: [id number] Date of Birth: 10-May-1946

## 2019-09-02 ENCOUNTER — Other Ambulatory Visit: Payer: Self-pay

## 2019-09-02 ENCOUNTER — Ambulatory Visit (INDEPENDENT_AMBULATORY_CARE_PROVIDER_SITE_OTHER): Payer: Medicare Other | Admitting: Physical Therapy

## 2019-09-02 DIAGNOSIS — R29818 Other symptoms and signs involving the nervous system: Secondary | ICD-10-CM | POA: Diagnosis not present

## 2019-09-02 DIAGNOSIS — G8929 Other chronic pain: Secondary | ICD-10-CM

## 2019-09-02 DIAGNOSIS — R2681 Unsteadiness on feet: Secondary | ICD-10-CM | POA: Diagnosis not present

## 2019-09-02 DIAGNOSIS — M6281 Muscle weakness (generalized): Secondary | ICD-10-CM

## 2019-09-02 DIAGNOSIS — M545 Low back pain: Secondary | ICD-10-CM | POA: Diagnosis not present

## 2019-09-02 DIAGNOSIS — I69154 Hemiplegia and hemiparesis following nontraumatic intracerebral hemorrhage affecting left non-dominant side: Secondary | ICD-10-CM | POA: Diagnosis not present

## 2019-09-02 NOTE — Therapy (Signed)
[place], [place], [address] Phone: [phone]   Fax:  [phone]  Physical Therapy Treatment  Patient Details  Name: Justin Adams MRN: [id number] Date of Birth: 1945-07-01 Referring Provider (PT): [name], MD   Encounter Date: 09/02/2019  PT End of Session - 09/02/19 0826    Visit Number  32    Number of Visits  36    Date for PT Re-Evaluation  83/41/96   recert on 08/02/95   Authorization Type  MCR AARP    PT Start Time  0800    PT Stop Time  0845    PT Time Calculation (min)  45 min    Activity Tolerance  Patient tolerated treatment well    Behavior During Therapy  [facility] for tasks assessed/performed       Past Medical History:  Diagnosis Date  . Adenomatous polyp   . Bleeding internal hemorrhoids 10/2017  . Cerebral venous thrombosis   . Grade I diastolic dysfunction 98/92/1194   noted on ECHO   . Hemorrhoids   . History of kidney stones   . Hypercholesteremia   . Hypertension   . Intracranial hemorrhage (HCC)    Right temporal lobe  . LVH (left ventricular hypertrophy) 07/11/2017   Mild, noted on ECHO   . Muscular degeneration   . Nephrolithiasis   . Obesity   . OSA (obstructive sleep apnea)   . Polycythemia   . Prostate cancer ([place]) 2003  . Shingles   . Stroke ([place]) 06/2017   No residual  . Unsteady gait   . Ureteral calculi 02/12/2008   Left    Past Surgical History:  Procedure Laterality Date  . APPENDECTOMY     childhood  . CHOLECYSTECTOMY    . COLONOSCOPY    . HEMORRHOID BANDING  2015  . HOLMIUM LASER APPLICATION Left 17/09/812   Procedure: HOLMIUM LASER APPLICATION;  Surgeon: Franchot Gallo, MD;  [facility];  Service: Urology;  Laterality: Left;  . IR ANGIO EXTERNAL CAROTID SEL EXT CAROTID UNI R MOD SED  07/10/2017  . IR ANGIO INTRA EXTRACRAN SEL INTERNAL CAROTID BILAT MOD SED  07/10/2017  . IR ANGIO VERTEBRAL SEL VERTEBRAL BILAT MOD SED  07/10/2017  . IR  URETERAL STENT LEFT NEW ACCESS W/O SEP NEPHROSTOMY CATH  03/12/2018  . LEFT HEART CATHETERIZATION WITH CORONARY ANGIOGRAM N/A 03/15/2013   Procedure: LEFT HEART CATHETERIZATION WITH CORONARY ANGIOGRAM;  Surgeon: Minus Breeding, MD;  Location: [facility] CATH LAB;  Service: Cardiovascular;  Laterality: N/A;  . NEPHROLITHOTOMY Left 03/12/2018   Procedure: LEFT NEPHROLITHOTOMY PERCUTANEOUS, STENT PLACEMENT;  Surgeon: Franchot Gallo, MD;  [facility];  Service: Urology;  Laterality: Left;  . PROSTATECTOMY    . STONE EXTRACTION WITH BASKET      There were no vitals filed for this visit.  Subjective Assessment - 09/02/19 0826    Subjective  doing okay, no complaitns    Pertinent History  PMH: HTN, CVA 06/2017, prostate Ca 2003,    Patient Stated Goals  improve gait and conditioning    Currently in Pain?  No/denies    Pain Onset  More than a month ago                       [facility] Adult PT Treatment/Exercise - 09/02/19 0001      High Level Balance   High Level Balance Comments  SLS cone taps X 3 bilat, marches on foam X 15 billat, step  ups and over on foam lateral  X 10 billat      Lumbar Exercises: Stretches   Other Lumbar Stretch Exercise  Standing L stretch 10 sec X 10 reps      Lumbar Exercises: Aerobic   Recumbent Bike  8 min L2      Lumbar Exercises: Machines for Strengthening   Leg Press  Shuttle leg press 100 lbs 3x15 reps      Lumbar Exercises: Standing   Other Standing Lumbar Exercises  TRX rows and squats 2x10 ea      Lumbar Exercises: Seated   Long Arc Quad on Chair  Both;2 sets;15 reps    LAQ on Chair Weights (lbs)  6      Knee/Hip Exercises: Standing   Knee Flexion  Both;20 reps    Knee Flexion Limitations  5 lbs    Hip Abduction  Both;20 reps    Abduction Limitations  5      Knee/Hip Exercises: Seated   Sit to Sand  10 reps;without UE support                  PT Long Term Goals - 08/31/19 0858      PT LONG TERM GOAL #1   Title  Pt  will report performing HEP, walking program and transition to gym at end of PT    Baseline  compliant with old HEP but progressed HEP today due to progress and he has not become consistent with going to gym as he just joined.    Time  6    Period  Weeks    Status  On-going      PT LONG TERM GOAL #2   Title  Pt will increaese overall leg strength to at least 4+ to 5- overall to improve function    Baseline  Met 05/12/19    Time  6    Period  Weeks    Status  Achieved      PT LONG TERM GOAL #3   Title  Will improve BERG to at least 53 to show improved balance    Baseline  now 54 on 12/2    Time  6    Period  Weeks    Status  Achieved      PT LONG TERM GOAL #4   Title  Will improve FGA to at least 29 to lower falls risk    Baseline  improved to 28 on 08/31/19    Time  6    Period  Weeks    Status  On-going      PT LONG TERM GOAL #5   Title  Will improve 6 minute walk test to 1560 ft    Baseline  1544 on 3/23    Time  6    Period  Weeks    Status  On-going            Plan - 09/02/19 0940    Clinical Impression Statement  He showed good balance with his balance exercises today and continues to improve in this area. He had no complaints with his strength training today, continue POC    Comorbidities  PMH: HTN, CVA 06/2017, prostate Ca 2003,    Examination-Activity Limitations  Bend;Squat;Stairs;Stand;Lift;Locomotion Level    Examination-Participation Restrictions  Community Activity;Shop;Yard Work    Rehab Potential  Good    PT Frequency  2x / week    PT Duration  6 weeks    PT Treatment/Interventions  Aquatic Therapy;Cryotherapy;Electrical Stimulation;Moist  Heat;Gait training;Stair training;Functional mobility training;Therapeutic activities;Therapeutic exercise;Balance training;Neuromuscular re-education;Manual techniques;Passive range of motion;Taping;Joint Manipulations    PT Next Visit Plan  lumbar stretch and strength, needs gait, balance, LE strength and conditioning     PT Home Exercise Plan  Access Code: [email or web address], added parial lunges, sit to stands, step ups fwd, lat, retro, also added LTR, SKTC, pelvic tilts, bridges, hip flexor stretch, HSS    Consulted and Agree with Plan of Care  Patient       Patient will benefit from skilled therapeutic intervention in order to improve the following deficits and impairments:  Abnormal gait, Decreased activity tolerance, Decreased balance, Decreased endurance, Decreased strength, Difficulty walking  Visit Diagnosis: Unsteadiness on feet  Muscle weakness (generalized)  Chronic bilateral low back pain without sciatica  Hemiplegia and hemiparesis following nontraumatic intracerebral hemorrhage affecting left non-dominant side (HCC)  Other symptoms and signs involving the nervous system     Problem List Patient Active Problem List   Diagnosis Date Noted  . Calculus of kidney 03/12/2018  . Bleeding internal hemorrhoids 10/09/2017  . Sleep apnea 09/22/2017  . History of adenomatous polyp of colon 09/18/2017  . Prostate cancer ([place]) 09/18/2017  . Essential hypertension 08/26/2017  . Hyperlipidemia 08/26/2017  . Cerebral venous thrombosis 07/13/2017  . Intracerebral hemorrhage 07/10/2017  . ICH (intracerebral hemorrhage) ([place]) 07/10/2017  . Unstable angina ([place]) 03/12/2013    Debbe Odea, PT,DPT 09/02/2019, 9:42 AM  [facility] Physical Therapy [address] [place], [place], [address] Phone: [phone]   Fax:  [phone]  Name: Justin Adams MRN: [id number] Date of Birth: 1945-06-13

## 2019-09-07 ENCOUNTER — Ambulatory Visit (INDEPENDENT_AMBULATORY_CARE_PROVIDER_SITE_OTHER): Payer: Medicare Other | Admitting: Physical Therapy

## 2019-09-07 ENCOUNTER — Encounter: Payer: Self-pay | Admitting: Physical Therapy

## 2019-09-07 ENCOUNTER — Other Ambulatory Visit: Payer: Self-pay

## 2019-09-07 DIAGNOSIS — R2681 Unsteadiness on feet: Secondary | ICD-10-CM

## 2019-09-07 DIAGNOSIS — M6281 Muscle weakness (generalized): Secondary | ICD-10-CM | POA: Diagnosis not present

## 2019-09-07 DIAGNOSIS — I69154 Hemiplegia and hemiparesis following nontraumatic intracerebral hemorrhage affecting left non-dominant side: Secondary | ICD-10-CM

## 2019-09-07 DIAGNOSIS — M545 Low back pain, unspecified: Secondary | ICD-10-CM

## 2019-09-07 DIAGNOSIS — G8929 Other chronic pain: Secondary | ICD-10-CM

## 2019-09-07 NOTE — Therapy (Signed)
[place], [place], [address] Phone: [phone]   Fax:  [phone]  Physical Therapy Treatment  Patient Details  Name: Justin Adams MRN: [id number] Date of Birth: [birth year] Referring Provider (PT): [name], MD   Encounter Date: 09/07/2019  PT End of Session - 09/07/19 0850    Visit Number  33    Number of Visits  36    Date for PT Re-Evaluation  89/38/10   recert on 1/75/10   Authorization Type  MCR AARP    PT Start Time  0800    PT Stop Time  0845    PT Time Calculation (min)  45 min    Activity Tolerance  Patient tolerated treatment well    Behavior During Therapy  [facility] for tasks assessed/performed       Past Medical History:  Diagnosis Date  . Adenomatous polyp   . Bleeding internal hemorrhoids 10/2017  . Cerebral venous thrombosis   . Grade I diastolic dysfunction 25/85/2778   noted on ECHO   . Hemorrhoids   . History of kidney stones   . Hypercholesteremia   . Hypertension   . Intracranial hemorrhage (HCC)    Right temporal lobe  . LVH (left ventricular hypertrophy) 07/11/2017   Mild, noted on ECHO   . Muscular degeneration   . Nephrolithiasis   . Obesity   . OSA (obstructive sleep apnea)   . Polycythemia   . Prostate cancer ([place]) 2003  . Shingles   . Stroke ([place]) 06/2017   No residual  . Unsteady gait   . Ureteral calculi 02/12/2008   Left    Past Surgical History:  Procedure Laterality Date  . APPENDECTOMY     childhood  . CHOLECYSTECTOMY    . COLONOSCOPY    . HEMORRHOID BANDING  2015  . HOLMIUM LASER APPLICATION Left 24/07/3534   Procedure: HOLMIUM LASER APPLICATION;  Surgeon: Franchot Gallo, MD;  [facility];  Service: Urology;  Laterality: Left;  . IR ANGIO EXTERNAL CAROTID SEL EXT CAROTID UNI R MOD SED  07/10/2017  . IR ANGIO INTRA EXTRACRAN SEL INTERNAL CAROTID BILAT MOD SED  07/10/2017  . IR ANGIO VERTEBRAL SEL VERTEBRAL BILAT MOD SED  07/10/2017  . IR  URETERAL STENT LEFT NEW ACCESS W/O SEP NEPHROSTOMY CATH  03/12/2018  . LEFT HEART CATHETERIZATION WITH CORONARY ANGIOGRAM N/A 03/15/2013   Procedure: LEFT HEART CATHETERIZATION WITH CORONARY ANGIOGRAM;  Surgeon: Minus Breeding, MD;  Location: [facility] CATH LAB;  Service: Cardiovascular;  Laterality: N/A;  . NEPHROLITHOTOMY Left 03/12/2018   Procedure: LEFT NEPHROLITHOTOMY PERCUTANEOUS, STENT PLACEMENT;  Surgeon: Franchot Gallo, MD;  [facility];  Service: Urology;  Laterality: Left;  . PROSTATECTOMY    . STONE EXTRACTION WITH BASKET      There were no vitals filed for this visit.  Subjective Assessment - 09/07/19 0812    Subjective  doing well, no pain reported    Pertinent History  PMH: HTN, CVA 06/2017, prostate Ca 2003,    Patient Stated Goals  improve gait and conditioning    Pain Onset  More than a month ago                       [facility] Adult PT Treatment/Exercise - 09/07/19 0001      High Level Balance   High Level Balance Comments  SLS on airex pad 10 sec X 4 bilat      Lumbar Exercises: Stretches   Other  Lumbar Stretch Exercise  Standing L stretch 10 sec X 10 reps      Lumbar Exercises: Aerobic   Recumbent Bike  8 min L2      Lumbar Exercises: Machines for Strengthening   Leg Press  Shuttle leg press 100 lbs 3x15 reps      Lumbar Exercises: Standing   Heel Raises Limitations  H/T raises X 20 ea    Other Standing Lumbar Exercises  TRX rows and squats 2x15 ea, then H abd 2X10      Lumbar Exercises: Seated   Long Arc Quad on Chair  Both;2 sets;15 reps    LAQ on Chair Weights (lbs)  6      Knee/Hip Exercises: Standing   Knee Flexion  Both;20 reps    Knee Flexion Limitations  5 lbs    Hip Abduction  Both;20 reps    Abduction Limitations  5                  PT Long Term Goals - 08/31/19 0858      PT LONG TERM GOAL #1   Title  Pt will report performing HEP, walking program and transition to gym at end of PT    Baseline  compliant with old  HEP but progressed HEP today due to progress and he has not become consistent with going to gym as he just joined.    Time  6    Period  Weeks    Status  On-going      PT LONG TERM GOAL #2   Title  Pt will increaese overall leg strength to at least 4+ to 5- overall to improve function    Baseline  Met 05/12/19    Time  6    Period  Weeks    Status  Achieved      PT LONG TERM GOAL #3   Title  Will improve BERG to at least 53 to show improved balance    Baseline  now 54 on 12/2    Time  6    Period  Weeks    Status  Achieved      PT LONG TERM GOAL #4   Title  Will improve FGA to at least 29 to lower falls risk    Baseline  improved to 28 on 08/31/19    Time  6    Period  Weeks    Status  On-going      PT LONG TERM GOAL #5   Title  Will improve 6 minute walk test to 1560 ft    Baseline  1544 on 3/23    Time  6    Period  Weeks    Status  On-going            Plan - 09/07/19 0850    Clinical Impression Statement  Progressed SLS from flat surface to foam surface but much more difficult for him. Continued with leg strength/postural strength with good tolerance. Continue POC    Comorbidities  PMH: HTN, CVA 06/2017, prostate Ca 2003,    Examination-Activity Limitations  Bend;Squat;Stairs;Stand;Lift;Locomotion Level    Examination-Participation Restrictions  Community Activity;Shop;Yard Work    Rehab Potential  Good    PT Frequency  2x / week    PT Duration  6 weeks    PT Treatment/Interventions  Aquatic Therapy;Cryotherapy;Electrical Stimulation;Moist Heat;Gait training;Stair training;Functional mobility training;Therapeutic activities;Therapeutic exercise;Balance training;Neuromuscular re-education;Manual techniques;Passive range of motion;Taping;Joint Manipulations    PT Next Visit Plan  lumbar stretch and strength,  needs gait, balance, LE strength and conditioning    PT Home Exercise Plan  Access Code: [email or web address], added parial lunges, sit to stands, step ups fwd, lat, retro,  also added LTR, SKTC, pelvic tilts, bridges, hip flexor stretch, HSS    Consulted and Agree with Plan of Care  Patient       Patient will benefit from skilled therapeutic intervention in order to improve the following deficits and impairments:  Abnormal gait, Decreased activity tolerance, Decreased balance, Decreased endurance, Decreased strength, Difficulty walking  Visit Diagnosis: Unsteadiness on feet  Muscle weakness (generalized)  Chronic bilateral low back pain without sciatica  Hemiplegia and hemiparesis following nontraumatic intracerebral hemorrhage affecting left non-dominant side [facility])     Problem List Patient Active Problem List   Diagnosis Date Noted  . Calculus of kidney 03/12/2018  . Bleeding internal hemorrhoids 10/09/2017  . Sleep apnea 09/22/2017  . History of adenomatous polyp of colon 09/18/2017  . Prostate cancer ([place]) 09/18/2017  . Essential hypertension 08/26/2017  . Hyperlipidemia 08/26/2017  . Cerebral venous thrombosis 07/13/2017  . Intracerebral hemorrhage 07/10/2017  . ICH (intracerebral hemorrhage) ([place]) 07/10/2017  . Unstable angina ([place]) 03/12/2013    Debbe Odea, PT,DPT 09/07/2019, 8:53 AM  [facility] Physical Therapy [address] [place], [place], [address] Phone: [phone]   Fax:  [phone]  Name: Justin Adams MRN: [id number] Date of Birth: 1945-07-09

## 2019-09-16 ENCOUNTER — Ambulatory Visit (INDEPENDENT_AMBULATORY_CARE_PROVIDER_SITE_OTHER): Payer: Medicare Other | Admitting: Physical Therapy

## 2019-09-16 ENCOUNTER — Other Ambulatory Visit: Payer: Self-pay

## 2019-09-16 DIAGNOSIS — M545 Low back pain, unspecified: Secondary | ICD-10-CM

## 2019-09-16 DIAGNOSIS — I69154 Hemiplegia and hemiparesis following nontraumatic intracerebral hemorrhage affecting left non-dominant side: Secondary | ICD-10-CM | POA: Diagnosis not present

## 2019-09-16 DIAGNOSIS — G8929 Other chronic pain: Secondary | ICD-10-CM

## 2019-09-16 DIAGNOSIS — R2681 Unsteadiness on feet: Secondary | ICD-10-CM | POA: Diagnosis not present

## 2019-09-16 DIAGNOSIS — M6281 Muscle weakness (generalized): Secondary | ICD-10-CM | POA: Diagnosis not present

## 2019-09-16 NOTE — Therapy (Signed)
[place], [place], [address] Phone: [phone]   Fax:  [phone]  Physical Therapy Treatment  Patient Details  Name: Justin Adams MRN: [id number] Date of Birth: 11-Dec-1945 Referring Provider (PT): [name], MD   Encounter Date: 09/16/2019  PT End of Session - 09/16/19 0837    Visit Number  34    Number of Visits  36    Date for PT Re-Evaluation  56/70/14   recert on 06/13/99   Authorization Type  MCR AARP    PT Start Time  0802    PT Stop Time  0900    PT Time Calculation (min)  58 min    Activity Tolerance  Patient tolerated treatment well    Behavior During Therapy  [facility] for tasks assessed/performed       Past Medical History:  Diagnosis Date  . Adenomatous polyp   . Bleeding internal hemorrhoids 10/2017  . Cerebral venous thrombosis   . Grade I diastolic dysfunction 31/43/8887   noted on ECHO   . Hemorrhoids   . History of kidney stones   . Hypercholesteremia   . Hypertension   . Intracranial hemorrhage (HCC)    Right temporal lobe  . LVH (left ventricular hypertrophy) 07/11/2017   Mild, noted on ECHO   . Muscular degeneration   . Nephrolithiasis   . Obesity   . OSA (obstructive sleep apnea)   . Polycythemia   . Prostate cancer ([place]) 2003  . Shingles   . Stroke ([place]) 06/2017   No residual  . Unsteady gait   . Ureteral calculi 02/12/2008   Left    Past Surgical History:  Procedure Laterality Date  . APPENDECTOMY     childhood  . CHOLECYSTECTOMY    . COLONOSCOPY    . HEMORRHOID BANDING  2015  . HOLMIUM LASER APPLICATION Left 57/02/7281   Procedure: HOLMIUM LASER APPLICATION;  Surgeon: Franchot Gallo, MD;  Location: [facility];  Service: Urology;  Laterality: Left;  . IR ANGIO EXTERNAL CAROTID SEL EXT CAROTID UNI R MOD SED  07/10/2017  . IR ANGIO INTRA EXTRACRAN SEL INTERNAL CAROTID BILAT MOD SED  07/10/2017  . IR ANGIO VERTEBRAL SEL VERTEBRAL BILAT MOD SED  07/10/2017  . IR  URETERAL STENT LEFT NEW ACCESS W/O SEP NEPHROSTOMY CATH  03/12/2018  . LEFT HEART CATHETERIZATION WITH CORONARY ANGIOGRAM N/A 03/15/2013   Procedure: LEFT HEART CATHETERIZATION WITH CORONARY ANGIOGRAM;  Surgeon: Minus Breeding, MD;  Location: [facility] CATH LAB;  Service: Cardiovascular;  Laterality: N/A;  . NEPHROLITHOTOMY Left 03/12/2018   Procedure: LEFT NEPHROLITHOTOMY PERCUTANEOUS, STENT PLACEMENT;  Surgeon: Franchot Gallo, MD;  [facility];  Service: Urology;  Laterality: Left;  . PROSTATECTOMY    . STONE EXTRACTION WITH BASKET      There were no vitals filed for this visit.  Subjective Assessment - 09/16/19 0822    Subjective  his back is bothering him more today, requests to do traction    Pertinent History  PMH: HTN, CVA 06/2017, prostate Ca 2003,    Patient Stated Goals  improve gait and conditioning    Pain Onset  More than a month ago        [facility] Adult PT Treatment/Exercise - 09/16/19 0001      High Level Balance   High Level Balance Comments  SLS on airex pad 10 sec X 4 bilat      Lumbar Exercises: Stretches   Other Lumbar Stretch Exercise  seated L stretch 10 sec  X 5, 3 way      Lumbar Exercises: Aerobic   Recumbent Bike  8 min L2      Lumbar Exercises: Machines for Strengthening   Leg Press  Shuttle leg press 112 lbs 3x12 reps      Lumbar Exercises: Standing   Heel Raises Limitations  H/T raises X 20 ea    Other Standing Lumbar Exercises  TRX rows and squats 2x15 ea, then H abd 2X10      Knee/Hip Exercises: Seated   Sit to Sand  10 reps;without UE support   airex pad     Traction   Type of Traction  Lumbar    Min (lbs)  85    Max (lbs)  100    Time  15                  PT Long Term Goals - 08/31/19 0858      PT LONG TERM GOAL #1   Title  Pt will report performing HEP, walking program and transition to gym at end of PT    Baseline  compliant with old HEP but progressed HEP today due to progress and he has not become consistent with going to  gym as he just joined.    Time  6    Period  Weeks    Status  On-going      PT LONG TERM GOAL #2   Title  Pt will increaese overall leg strength to at least 4+ to 5- overall to improve function    Baseline  Met 05/12/19    Time  6    Period  Weeks    Status  Achieved      PT LONG TERM GOAL #3   Title  Will improve BERG to at least 53 to show improved balance    Baseline  now 54 on 12/2    Time  6    Period  Weeks    Status  Achieved      PT LONG TERM GOAL #4   Title  Will improve FGA to at least 29 to lower falls risk    Baseline  improved to 28 on 08/31/19    Time  6    Period  Weeks    Status  On-going      PT LONG TERM GOAL #5   Title  Will improve 6 minute walk test to 1560 ft    Baseline  1544 on 3/23    Time  6    Period  Weeks    Status  On-going            Plan - 09/16/19 0850    Clinical Impression Statement  Used mechanical traction today to decrease overall pain. Continued with strength program with fair tolerance. PT will continue to progress as able. He still has difficulty getting up from low chair and PT will continue to work on leg strength.    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    Rehab Potential  Good    PT Frequency  2x / week    PT Duration  6 weeks    PT Treatment/Interventions  Aquatic Therapy;Cryotherapy;Electrical Stimulation;Moist Heat;Gait training;Stair training;Functional mobility training;Therapeutic activities;Therapeutic exercise;Balance training;Neuromuscular re-education;Manual techniques;Passive range of  motion;Taping;Joint Manipulations    PT Next Visit Plan  lumbar stretch and strength, needs gait, balance, LE strength and  conditioning    PT Home Exercise Plan  Access Code: [email or web address], added parial lunges, sit to stands, step ups fwd, lat, retro, also added LTR, SKTC, pelvic tilts, bridges, hip flexor stretch, HSS    Consulted and Agree with Plan of Care  Patient       Patient will benefit from skilled therapeutic intervention in order to improve the following deficits and impairments:  Abnormal gait, Decreased activity tolerance, Decreased balance, Decreased endurance, Decreased strength, Difficulty walking  Visit Diagnosis: Unsteadiness on feet  Muscle weakness (generalized)  Chronic bilateral low back pain without sciatica  Hemiplegia and hemiparesis following nontraumatic intracerebral hemorrhage affecting left non-dominant side [facility])     Problem List Patient Active Problem List   Diagnosis Date Noted  . Calculus of kidney 03/12/2018  . Bleeding internal hemorrhoids 10/09/2017  . Sleep apnea 09/22/2017  . History of adenomatous polyp of colon 09/18/2017  . Prostate cancer ([place]) 09/18/2017  . Essential hypertension 08/26/2017  . Hyperlipidemia 08/26/2017  . Cerebral venous thrombosis 07/13/2017  . Intracerebral hemorrhage 07/10/2017  . ICH (intracerebral hemorrhage) ([place]) 07/10/2017  . Unstable angina ([place]) 03/12/2013    Debbe Odea, PT,DPT 09/16/2019, 9:00 AM  [facility] Physical Therapy [address] [place], [place], [address] Phone: [phone]   Fax:  [phone]  Name: Justin Adams MRN: [id number] Date of Birth: [birth year]

## 2019-09-17 ENCOUNTER — Ambulatory Visit (INDEPENDENT_AMBULATORY_CARE_PROVIDER_SITE_OTHER): Payer: Medicare Other | Admitting: Physical Therapy

## 2019-09-17 ENCOUNTER — Encounter: Payer: Self-pay | Admitting: Physical Therapy

## 2019-09-17 DIAGNOSIS — R2681 Unsteadiness on feet: Secondary | ICD-10-CM

## 2019-09-17 DIAGNOSIS — R29818 Other symptoms and signs involving the nervous system: Secondary | ICD-10-CM

## 2019-09-17 DIAGNOSIS — G8929 Other chronic pain: Secondary | ICD-10-CM

## 2019-09-17 DIAGNOSIS — I69154 Hemiplegia and hemiparesis following nontraumatic intracerebral hemorrhage affecting left non-dominant side: Secondary | ICD-10-CM

## 2019-09-17 DIAGNOSIS — M545 Low back pain, unspecified: Secondary | ICD-10-CM

## 2019-09-17 DIAGNOSIS — M6281 Muscle weakness (generalized): Secondary | ICD-10-CM

## 2019-09-17 NOTE — Therapy (Signed)
[place], [place], [address] Phone: [phone]   Fax:  [phone]  Physical Therapy Treatment  Patient Details  Name: Justin Adams MRN: [id number] Date of Birth: 1946-04-23 Referring Provider (PT): [name], MD   Encounter Date: 09/17/2019  PT End of Session - 09/17/19 0841    Visit Number  35    Number of Visits  36    Date for PT Re-Evaluation  00/34/91   recert on 7/91/50   Authorization Type  MCR AARP    PT Start Time  0802    PT Stop Time  0855    PT Time Calculation (min)  53 min    Activity Tolerance  Patient tolerated treatment well    Behavior During Therapy  [facility] for tasks assessed/performed       Past Medical History:  Diagnosis Date  . Adenomatous polyp   . Bleeding internal hemorrhoids 10/2017  . Cerebral venous thrombosis   . Grade I diastolic dysfunction 56/97/9480   noted on ECHO   . Hemorrhoids   . History of kidney stones   . Hypercholesteremia   . Hypertension   . Intracranial hemorrhage (HCC)    Right temporal lobe  . LVH (left ventricular hypertrophy) 07/11/2017   Mild, noted on ECHO   . Muscular degeneration   . Nephrolithiasis   . Obesity   . OSA (obstructive sleep apnea)   . Polycythemia   . Prostate cancer ([place]) 2003  . Shingles   . Stroke ([place]) 06/2017   No residual  . Unsteady gait   . Ureteral calculi 02/12/2008   Left    Past Surgical History:  Procedure Laterality Date  . APPENDECTOMY     childhood  . CHOLECYSTECTOMY    . COLONOSCOPY    . HEMORRHOID BANDING  2015  . HOLMIUM LASER APPLICATION Left 16/10/5372   Procedure: HOLMIUM LASER APPLICATION;  Surgeon: Franchot Gallo, MD;  [facility];  Service: Urology;  Laterality: Left;  . IR ANGIO EXTERNAL CAROTID SEL EXT CAROTID UNI R MOD SED  07/10/2017  . IR ANGIO INTRA EXTRACRAN SEL INTERNAL CAROTID BILAT MOD SED  07/10/2017  . IR ANGIO VERTEBRAL SEL VERTEBRAL BILAT MOD SED  07/10/2017  . IR  URETERAL STENT LEFT NEW ACCESS W/O SEP NEPHROSTOMY CATH  03/12/2018  . LEFT HEART CATHETERIZATION WITH CORONARY ANGIOGRAM N/A 03/15/2013   Procedure: LEFT HEART CATHETERIZATION WITH CORONARY ANGIOGRAM;  Surgeon: Minus Breeding, MD;  Location: [facility] CATH LAB;  Service: Cardiovascular;  Laterality: N/A;  . NEPHROLITHOTOMY Left 03/12/2018   Procedure: LEFT NEPHROLITHOTOMY PERCUTANEOUS, STENT PLACEMENT;  Surgeon: Franchot Gallo, MD;  [facility];  Service: Urology;  Laterality: Left;  . PROSTATECTOMY    . STONE EXTRACTION WITH BASKET      There were no vitals filed for this visit.  Subjective Assessment - 09/17/19 0818    Subjective  his back feels a little better, thinks the traciton table helped.    Pertinent History  PMH: HTN, CVA 06/2017, prostate Ca 2003,    Patient Stated Goals  improve gait and conditioning    Currently in Pain?  Yes    Pain Score  2     Pain Location  Back    Pain Orientation  Lower    Pain Onset  More than a month ago                       [facility] Adult PT Treatment/Exercise - 09/17/19  0001      High Level Balance   High Level Balance Comments  SLS on airex pad 10 sec X 4 bilat      Lumbar Exercises: Stretches   Other Lumbar Stretch Exercise  seated L stretch 10 sec X 5, 3 way      Lumbar Exercises: Aerobic   Recumbent Bike  8 min L2      Lumbar Exercises: Machines for Strengthening   Leg Press  Shuttle leg press 112 lbs 3x12 reps      Lumbar Exercises: Standing   Other Standing Lumbar Exercises  TRX rows and squats 2x15 ea, then H abd 2X10      Knee/Hip Exercises: Seated   Sit to Sand  10 reps;without UE support   airex pad in chair     Traction   Type of Traction  Lumbar    Min (lbs)  85    Max (lbs)  105    Time  15                  PT Long Term Goals - 08/31/19 0858      PT LONG TERM GOAL #1   Title  Pt will report performing HEP, walking program and transition to gym at end of PT    Baseline  compliant with  old HEP but progressed HEP today due to progress and he has not become consistent with going to gym as he just joined.    Time  6    Period  Weeks    Status  On-going      PT LONG TERM GOAL #2   Title  Pt will increaese overall leg strength to at least 4+ to 5- overall to improve function    Baseline  Met 05/12/19    Time  6    Period  Weeks    Status  Achieved      PT LONG TERM GOAL #3   Title  Will improve BERG to at least 53 to show improved balance    Baseline  now 54 on 12/2    Time  6    Period  Weeks    Status  Achieved      PT LONG TERM GOAL #4   Title  Will improve FGA to at least 29 to lower falls risk    Baseline  improved to 28 on 08/31/19    Time  6    Period  Weeks    Status  On-going      PT LONG TERM GOAL #5   Title  Will improve 6 minute walk test to 1560 ft    Baseline  1544 on 3/23    Time  6    Period  Weeks    Status  On-going            Plan - 09/17/19 4650    Clinical Impression Statement  Continued with mechancial traction to reduce overall back pain as he responded well to this last session. Continued with overall strength, endurance, balance program. He is at end of plan of care and PT will assess next time for recert.    Personal Factors and Comorbidities  Comorbidity 1;Comorbidity 2    Comorbidities  PMH: HTN, CVA 06/2017, prostate Ca 2003,    Examination-Activity Limitations  Bend;Squat;Stairs;Stand;Lift;Locomotion Level    Examination-Participation Restrictions  Community Activity;Shop;Yard Work    Journalist, newspaper    Rehab Potential  Good  PT Frequency  2x / week    PT Duration  6 weeks    PT Treatment/Interventions  Aquatic Therapy;Cryotherapy;Electrical Stimulation;Moist Heat;Gait training;Stair training;Functional mobility training;Therapeutic activities;Therapeutic exercise;Balance training;Neuromuscular re-education;Manual techniques;Passive range of motion;Taping;Joint Manipulations     PT Next Visit Plan  lumbar stretch and strength, needs gait, balance, LE strength and conditioning    PT Home Exercise Plan  Access Code: [email or web address], added parial lunges, sit to stands, step ups fwd, lat, retro, also added LTR, SKTC, pelvic tilts, bridges, hip flexor stretch, HSS    Consulted and Agree with Plan of Care  Patient       Patient will benefit from skilled therapeutic intervention in order to improve the following deficits and impairments:  Abnormal gait, Decreased activity tolerance, Decreased balance, Decreased endurance, Decreased strength, Difficulty walking  Visit Diagnosis: Unsteadiness on feet  Muscle weakness (generalized)  Chronic bilateral low back pain without sciatica  Hemiplegia and hemiparesis following nontraumatic intracerebral hemorrhage affecting left non-dominant side (HCC)  Other symptoms and signs involving the nervous system     Problem List Patient Active Problem List   Diagnosis Date Noted  . Calculus of kidney 03/12/2018  . Bleeding internal hemorrhoids 10/09/2017  . Sleep apnea 09/22/2017  . History of adenomatous polyp of colon 09/18/2017  . Prostate cancer ([place]) 09/18/2017  . Essential hypertension 08/26/2017  . Hyperlipidemia 08/26/2017  . Cerebral venous thrombosis 07/13/2017  . Intracerebral hemorrhage 07/10/2017  . ICH (intracerebral hemorrhage) ([place]) 07/10/2017  . Unstable angina ([place]) 03/12/2013    Debbe Odea, PT,DPT 09/17/2019, 8:44 AM  [facility] Physical Therapy [address] [place], [place], [address] Phone: [phone]   Fax:  [phone]  Name: DOC MANDALA MRN: [id number] Date of Birth: 1945/12/26

## 2019-09-21 ENCOUNTER — Encounter: Payer: Self-pay | Admitting: Physical Therapy

## 2019-09-21 ENCOUNTER — Other Ambulatory Visit: Payer: Self-pay

## 2019-09-21 ENCOUNTER — Ambulatory Visit (INDEPENDENT_AMBULATORY_CARE_PROVIDER_SITE_OTHER): Payer: Medicare Other | Admitting: Physical Therapy

## 2019-09-21 DIAGNOSIS — M545 Low back pain, unspecified: Secondary | ICD-10-CM

## 2019-09-21 DIAGNOSIS — M6281 Muscle weakness (generalized): Secondary | ICD-10-CM | POA: Diagnosis not present

## 2019-09-21 DIAGNOSIS — R2681 Unsteadiness on feet: Secondary | ICD-10-CM

## 2019-09-21 DIAGNOSIS — I69154 Hemiplegia and hemiparesis following nontraumatic intracerebral hemorrhage affecting left non-dominant side: Secondary | ICD-10-CM

## 2019-09-21 DIAGNOSIS — G8929 Other chronic pain: Secondary | ICD-10-CM

## 2019-09-21 DIAGNOSIS — R29818 Other symptoms and signs involving the nervous system: Secondary | ICD-10-CM | POA: Diagnosis not present

## 2019-09-21 NOTE — Therapy (Signed)
[place], [place], [address] Phone: [phone]   Fax:  [phone]  Physical Therapy Treatment  Patient Details  Name: SALIM FORERO MRN: [id number] Date of Birth: 01-12-1946 Referring Provider (PT): [name], MD   Encounter Date: 09/21/2019  PT End of Session - 09/21/19 0844    Visit Number  36    Number of Visits  36    Date for PT Re-Evaluation  37/85/88   recert on 10/10/75   Authorization Type  MCR AARP    PT Start Time  0802    PT Stop Time  0856    PT Time Calculation (min)  54 min    Activity Tolerance  Patient tolerated treatment well    Behavior During Therapy  [facility] for tasks assessed/performed       Past Medical History:  Diagnosis Date  . Adenomatous polyp   . Bleeding internal hemorrhoids 10/2017  . Cerebral venous thrombosis   . Grade I diastolic dysfunction 41/28/7867   noted on ECHO   . Hemorrhoids   . History of kidney stones   . Hypercholesteremia   . Hypertension   . Intracranial hemorrhage (HCC)    Right temporal lobe  . LVH (left ventricular hypertrophy) 07/11/2017   Mild, noted on ECHO   . Muscular degeneration   . Nephrolithiasis   . Obesity   . OSA (obstructive sleep apnea)   . Polycythemia   . Prostate cancer ([place]) 2003  . Shingles   . Stroke ([place]) 06/2017   No residual  . Unsteady gait   . Ureteral calculi 02/12/2008   Left    Past Surgical History:  Procedure Laterality Date  . APPENDECTOMY     childhood  . CHOLECYSTECTOMY    . COLONOSCOPY    . HEMORRHOID BANDING  2015  . HOLMIUM LASER APPLICATION Left 67/07/945   Procedure: HOLMIUM LASER APPLICATION;  Surgeon: Franchot Gallo, MD;  [facility];  Service: Urology;  Laterality: Left;  . IR ANGIO EXTERNAL CAROTID SEL EXT CAROTID UNI R MOD SED  07/10/2017  . IR ANGIO INTRA EXTRACRAN SEL INTERNAL CAROTID BILAT MOD SED  07/10/2017  . IR ANGIO VERTEBRAL SEL VERTEBRAL BILAT MOD SED  07/10/2017  . IR  URETERAL STENT LEFT NEW ACCESS W/O SEP NEPHROSTOMY CATH  03/12/2018  . LEFT HEART CATHETERIZATION WITH CORONARY ANGIOGRAM N/A 03/15/2013   Procedure: LEFT HEART CATHETERIZATION WITH CORONARY ANGIOGRAM;  Surgeon: Minus Breeding, MD;  Location: [facility] CATH LAB;  Service: Cardiovascular;  Laterality: N/A;  . NEPHROLITHOTOMY Left 03/12/2018   Procedure: LEFT NEPHROLITHOTOMY PERCUTANEOUS, STENT PLACEMENT;  Surgeon: Franchot Gallo, MD;  [facility];  Service: Urology;  Laterality: Left;  . PROSTATECTOMY    . STONE EXTRACTION WITH BASKET      There were no vitals filed for this visit.  Subjective Assessment - 09/21/19 0817    Subjective  his back feels about 2-3/10 today after getting out of bed, then it eases off some                       [facility] Adult PT Treatment/Exercise - 09/21/19 0001      High Level Balance   High Level Balance Comments  SLS on airex pad 30 sec X 3 bilat      Lumbar Exercises: Stretches   Other Lumbar Stretch Exercise  seated L stretch 10 sec X 5, 3 way      Lumbar Exercises: Aerobic   Recumbent  Bike  8 min L3      Lumbar Exercises: Standing   Other Standing Lumbar Exercises  TRX rows and squats 2x15      Knee/Hip Exercises: Seated   Sit to Sand  10 reps;without UE support   airex pad     Traction   Type of Traction  Lumbar    Min (lbs)  90    Max (lbs)  105    Hold Time  pre set program    Time  15                  PT Long Term Goals - 08/31/19 0459      PT LONG TERM GOAL #1   Title  Pt will report performing HEP, walking program and transition to gym at end of PT    Baseline  compliant with old HEP but progressed HEP today due to progress and he has not become consistent with going to gym as he just joined.    Time  6    Period  Weeks    Status  On-going      PT LONG TERM GOAL #2   Title  Pt will increaese overall leg strength to at least 4+ to 5- overall to improve function    Baseline  Met 05/12/19    Time  6     Period  Weeks    Status  Achieved      PT LONG TERM GOAL #3   Title  Will improve BERG to at least 53 to show improved balance    Baseline  now 54 on 12/2    Time  6    Period  Weeks    Status  Achieved      PT LONG TERM GOAL #4   Title  Will improve FGA to at least 29 to lower falls risk    Baseline  improved to 28 on 08/31/19    Time  6    Period  Weeks    Status  On-going      PT LONG TERM GOAL #5   Title  Will improve 6 minute walk test to 1560 ft    Baseline  1544 on 3/23    Time  6    Period  Weeks    Status  On-going            Plan - 09/21/19 0844    Clinical Impression Statement  His back pain appears to be improving some with exercise and mechanical traction. He will need reassessment next visit to determine recert of his POC.    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    Rehab Potential  Good    PT Frequency  2x / week    PT Duration  6 weeks    PT Treatment/Interventions  Aquatic Therapy;Cryotherapy;Electrical Stimulation;Moist Heat;Gait training;Stair training;Functional mobility training;Therapeutic activities;Therapeutic exercise;Balance training;Neuromuscular re-education;Manual techniques;Passive range of motion;Taping;Joint Manipulations    PT Next Visit Plan  lumbar stretch and strength, needs gait, balance, LE strength and conditioning    PT Home Exercise Plan  Access Code: [email or web address], added parial lunges, sit to stands, step ups fwd, lat, retro, also added LTR, SKTC, pelvic tilts, bridges, hip flexor stretch, HSS    Consulted and Agree  with Plan of Care  Patient       Patient will benefit from skilled therapeutic intervention in order to improve  the following deficits and impairments:  Abnormal gait, Decreased activity tolerance, Decreased balance, Decreased endurance, Decreased strength, Difficulty walking  Visit Diagnosis: Unsteadiness on feet  Muscle weakness (generalized)  Chronic bilateral low back pain without sciatica  Hemiplegia and hemiparesis following nontraumatic intracerebral hemorrhage affecting left non-dominant side (HCC)  Other symptoms and signs involving the nervous system     Problem List Patient Active Problem List   Diagnosis Date Noted  . Calculus of kidney 03/12/2018  . Bleeding internal hemorrhoids 10/09/2017  . Sleep apnea 09/22/2017  . History of adenomatous polyp of colon 09/18/2017  . Prostate cancer ([place]) 09/18/2017  . Essential hypertension 08/26/2017  . Hyperlipidemia 08/26/2017  . Cerebral venous thrombosis 07/13/2017  . Intracerebral hemorrhage 07/10/2017  . ICH (intracerebral hemorrhage) ([place]) 07/10/2017  . Unstable angina [facility]) 03/12/2013    Silvestre Mesi 09/21/2019, 8:46 AM  [facility] Physical Therapy [address] [place], [place], [address] Phone: [phone]   Fax:  [phone]  Name: SHOAIB SIEFKER MRN: [id number] Date of Birth: 30-Jan-1946

## 2019-09-23 ENCOUNTER — Other Ambulatory Visit: Payer: Self-pay

## 2019-09-23 ENCOUNTER — Telehealth: Payer: Self-pay | Admitting: Neurology

## 2019-09-23 DIAGNOSIS — E785 Hyperlipidemia, unspecified: Secondary | ICD-10-CM

## 2019-09-23 MED ORDER — AMLODIPINE BESYLATE 2.5 MG PO TABS: 2.5000 mg | ORAL_TABLET | Freq: Every day | ORAL | 0 refills | Status: DC | PRN

## 2019-09-23 MED ORDER — ATORVASTATIN CALCIUM 20 MG PO TABS: 20.0000 mg | ORAL_TABLET | Freq: Every day | ORAL | 0 refills | Status: DC

## 2019-09-23 NOTE — Telephone Encounter (Signed)
Pt called, would like a 90 day prescription sent in for the amlodipine and atorvastitatin to [facility].

## 2019-09-24 ENCOUNTER — Ambulatory Visit (INDEPENDENT_AMBULATORY_CARE_PROVIDER_SITE_OTHER): Payer: Medicare Other | Admitting: Physical Therapy

## 2019-09-24 ENCOUNTER — Other Ambulatory Visit: Payer: Self-pay

## 2019-09-24 DIAGNOSIS — M545 Low back pain, unspecified: Secondary | ICD-10-CM

## 2019-09-24 DIAGNOSIS — R29818 Other symptoms and signs involving the nervous system: Secondary | ICD-10-CM | POA: Diagnosis not present

## 2019-09-24 DIAGNOSIS — R2681 Unsteadiness on feet: Secondary | ICD-10-CM | POA: Diagnosis not present

## 2019-09-24 DIAGNOSIS — G8929 Other chronic pain: Secondary | ICD-10-CM

## 2019-09-24 DIAGNOSIS — M6281 Muscle weakness (generalized): Secondary | ICD-10-CM

## 2019-09-24 DIAGNOSIS — I69154 Hemiplegia and hemiparesis following nontraumatic intracerebral hemorrhage affecting left non-dominant side: Secondary | ICD-10-CM | POA: Diagnosis not present

## 2019-09-24 NOTE — Therapy (Signed)
[place], [place], [address] Phone: [phone]   Fax:  [phone]  Physical Therapy Treatment  Patient Details  Name: Justin Adams MRN: [id number] Date of Birth: [birth year] Referring Provider (PT): [name], MD   Encounter Date: 09/24/2019  PT End of Session - 09/24/19 0900    Visit Number  37    Number of Visits  48    Date for PT Re-Evaluation  90/30/09   recert on 2/33 to extend POC to 11/05/19   Authorization Type  MCR AARP    PT Start Time  0802    PT Stop Time  0902    PT Time Calculation (min)  60 min    Activity Tolerance  Patient tolerated treatment well    Behavior During Therapy  [facility] for tasks assessed/performed       Past Medical History:  Diagnosis Date  . Adenomatous polyp   . Bleeding internal hemorrhoids 10/2017  . Cerebral venous thrombosis   . Grade I diastolic dysfunction 00/76/2263   noted on ECHO   . Hemorrhoids   . History of kidney stones   . Hypercholesteremia   . Hypertension   . Intracranial hemorrhage (HCC)    Right temporal lobe  . LVH (left ventricular hypertrophy) 07/11/2017   Mild, noted on ECHO   . Muscular degeneration   . Nephrolithiasis   . Obesity   . OSA (obstructive sleep apnea)   . Polycythemia   . Prostate cancer ([place]) 2003  . Shingles   . Stroke ([place]) 06/2017   No residual  . Unsteady gait   . Ureteral calculi 02/12/2008   Left    Past Surgical History:  Procedure Laterality Date  . APPENDECTOMY     childhood  . CHOLECYSTECTOMY    . COLONOSCOPY    . HEMORRHOID BANDING  2015  . HOLMIUM LASER APPLICATION Left 33/10/4560   Procedure: HOLMIUM LASER APPLICATION;  Surgeon: Franchot Gallo, MD;  [facility]: [facility];  Service: Urology;  Laterality: Left;  . IR ANGIO EXTERNAL CAROTID SEL EXT CAROTID UNI R MOD SED  07/10/2017  . IR ANGIO INTRA EXTRACRAN SEL INTERNAL CAROTID BILAT MOD SED  07/10/2017  . IR ANGIO VERTEBRAL SEL VERTEBRAL BILAT MOD SED   07/10/2017  . IR URETERAL STENT LEFT NEW ACCESS W/O SEP NEPHROSTOMY CATH  03/12/2018  . LEFT HEART CATHETERIZATION WITH CORONARY ANGIOGRAM N/A 03/15/2013   Procedure: LEFT HEART CATHETERIZATION WITH CORONARY ANGIOGRAM;  Surgeon: Minus Breeding, MD;  Location: [facility] C[facility] LAB;  Service: Cardiovascular;  Laterality: N/A;  . NEPHROLITHOTOMY Left 03/12/2018   Procedure: LEFT NEPHROLITHOTOMY PERCUTANEOUS, STENT PLACEMENT;  Surgeon: Franchot Gallo, MD;  [facility];  Service: Urology;  Laterality: Left;  . PROSTATECTOMY    . STONE EXTRACTION WITH BASKET      There were no vitals filed for this visit.  Subjective Assessment - 09/24/19 0900    Subjective  his back feels a little better about 2-3/10 pain but still pretty bad after getting out of bed, then it eases off some    Pertinent History  PMH: HTN, CVA 06/2017, prostate Ca 2003,    Patient Stated Goals  improve gait and conditioning    Pain Onset  More than a month ago         [facility] PT Assessment - 09/24/19 0001      Assessment   Medical Diagnosis  Unsteady Gait, LBP    Referring Provider (PT)  [name], MD  Balance Screen   Has the patient fallen in the past 6 months  No      Strength   Overall Strength Comments  knee strength overall 5/5, hip strength overall 5-/5 MMT grossly tested in sitting      6 minute walk test results    Aerobic Endurance Distance Walked  1650    Endurance additional comments  improved from 1544 back in March      Functional Gait  Assessment   Gait assessed   Yes    Gait Level Surface  Walks 20 ft in less than 5.5 sec, no assistive devices, good speed, no evidence for imbalance, normal gait pattern, deviates no more than 6 in outside of the 12 in walkway width.    Change in Gait Speed  Able to smoothly change walking speed without loss of balance or gait deviation. Deviate no more than 6 in outside of the 12 in walkway width.    Gait with Horizontal Head Turns  Performs head turns smoothly  with no change in gait. Deviates no more than 6 in outside 12 in walkway width    Gait with Vertical Head Turns  Performs head turns with no change in gait. Deviates no more than 6 in outside 12 in walkway width.    Gait and Pivot Turn  Pivot turns safely within 3 sec and stops quickly with no loss of balance.    Step Over Obstacle  Is able to step over 2 stacked shoe boxes taped together (9 in total height) without changing gait speed. No evidence of imbalance.    Gait with Narrow Base of Support  Ambulates 7-9 steps.    Gait with Eyes Closed  Walks 20 ft, uses assistive device, slower speed, mild gait deviations, deviates 6-10 in outside 12 in walkway width. Ambulates 20 ft in less than 9 sec but greater than 7 sec.    Ambulating Backwards  Walks 20 ft, uses assistive device, slower speed, mild gait deviations, deviates 6-10 in outside 12 in walkway width.    Steps  Alternating feet, no rail.    Total Score  27                   [facility] Adult PT Treatment/Exercise - 09/24/19 0001      Lumbar Exercises: Aerobic   Recumbent Bike  8 min L3      Traction   Type of Traction  Lumbar    Min (lbs)  90    Max (lbs)  110    Hold Time  pre set program    Time  15                  PT Long Term Goals - 09/24/19 0904      PT LONG TERM GOAL #1   Title  Pt will report performing HEP, walking program and transition to gym at end of PT    Baseline  compliant with old HEP but progressed HEP today due to progress and he has not become consistent with going to gym as he just joined.    Time  6    Period  Weeks    Status  On-going      PT LONG TERM GOAL #2   Title  Pt will increaese overall leg strength to at least 4+ to 5- overall to improve function    Baseline  Met 05/12/19    Time  6    Period  Weeks  Status  Achieved      PT LONG TERM GOAL #3   Title  Will improve BERG to at least 53 to show improved balance    Baseline  now 54 on 12/2    Time  6    Period  Weeks     Status  Achieved      PT LONG TERM GOAL #4   Title  Will improve FGA to at least 29 to lower falls risk    Baseline  improved to 28 on 08/31/19    Time  6    Period  Weeks    Status  On-going      PT LONG TERM GOAL #5   Title  Will improve 6 minute walk test to 1560 ft    Baseline  1544 on 3/23    Time  6    Period  Weeks    Status  Achieved            Plan - 09/24/19 0902    Clinical Impression Statement  Recert perfromed today as he is at end of his POC. He has now met 3 out of 5 long term goals. He has made progress with strength, gait, endurance, and balance but still with mild deficits in these areas along with decresed balance. PT recommending 6 more weeks of PT to maximize function and decreaese risk of falls.    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    Rehab Potential  Good    PT Frequency  2x / week    PT Duration  6 weeks    PT Treatment/Interventions  Aquatic Therapy;Cryotherapy;Electrical Stimulation;Moist Heat;Gait training;Stair training;Functional mobility training;Therapeutic activities;Therapeutic exercise;Balance training;Neuromuscular re-education;Manual techniques;Passive range of motion;Taping;Joint Manipulations    PT Next Visit Plan  lumbar stretch and strength, needs gait, balance, LE strength and conditioning    PT Home Exercise Plan  Access Code: [email or web address], added parial lunges, sit to stands, step ups fwd, lat, retro, also added LTR, SKTC, pelvic tilts, bridges, hip flexor stretch, HSS    Consulted and Agree with Plan of Care  Patient       Patient will benefit from skilled therapeutic intervention in order to improve the following deficits and  impairments:  Abnormal gait, Decreased activity tolerance, Decreased balance, Decreased endurance, Decreased strength, Difficulty walking  Visit Diagnosis: Unsteadiness on feet  Muscle weakness (generalized)  Chronic bilateral low back pain without sciatica  Hemiplegia and hemiparesis following nontraumatic intracerebral hemorrhage affecting left non-dominant side (HCC)  Other symptoms and signs involving the nervous system     Problem List Patient Active Problem List   Diagnosis Date Noted  . Calculus of kidney 03/12/2018  . Bleeding internal hemorrhoids 10/09/2017  . Sleep apnea 09/22/2017  . History of adenomatous polyp of colon 09/18/2017  . Prostate cancer ([place]) 09/18/2017  . Essential hypertension 08/26/2017  . Hyperlipidemia 08/26/2017  . Cerebral venous thrombosis 07/13/2017  . Intracerebral hemorrhage 07/10/2017  . ICH (intracerebral hemorrhage) ([place]) 07/10/2017  . Unstable angina [facility]) 03/12/2013    Silvestre Mesi 09/24/2019, 9:13 AM  [facility] Physical Therapy [address] [place], [place], [address] Phone: [phone]   Fax:  [phone]  Name: Justin Adams MRN: [id number] Date of Birth: December 08, 1945

## 2019-09-28 ENCOUNTER — Other Ambulatory Visit: Payer: Self-pay

## 2019-09-28 ENCOUNTER — Ambulatory Visit (INDEPENDENT_AMBULATORY_CARE_PROVIDER_SITE_OTHER): Payer: Medicare Other | Admitting: Physical Therapy

## 2019-09-28 DIAGNOSIS — M545 Low back pain, unspecified: Secondary | ICD-10-CM

## 2019-09-28 DIAGNOSIS — I69154 Hemiplegia and hemiparesis following nontraumatic intracerebral hemorrhage affecting left non-dominant side: Secondary | ICD-10-CM

## 2019-09-28 DIAGNOSIS — G8929 Other chronic pain: Secondary | ICD-10-CM | POA: Diagnosis not present

## 2019-09-28 DIAGNOSIS — R2681 Unsteadiness on feet: Secondary | ICD-10-CM

## 2019-09-28 DIAGNOSIS — M6281 Muscle weakness (generalized): Secondary | ICD-10-CM | POA: Diagnosis not present

## 2019-09-28 NOTE — Therapy (Signed)
[place], [place], [address] Phone: [phone]   Fax:  [phone]  Physical Therapy Treatment  Patient Details  Name: Justin Adams MRN: [id number] Date of Birth: 1946/03/26 Referring Provider (PT): [name], MD   Encounter Date: 09/28/2019  PT End of Session - 09/28/19 0856    Visit Number  38    Number of Visits  48    Date for PT Re-Evaluation  25/49/82   recert on 6/41 to extend POC to 11/05/19   Authorization Type  MCR AARP    PT Start Time  0802    PT Stop Time  0900    PT Time Calculation (min)  58 min    Activity Tolerance  Patient tolerated treatment well    Behavior During Therapy  [facility] for tasks assessed/performed       Past Medical History:  Diagnosis Date  . Adenomatous polyp   . Bleeding internal hemorrhoids 10/2017  . Cerebral venous thrombosis   . Grade I diastolic dysfunction 58/30/9407   noted on ECHO   . Hemorrhoids   . History of kidney stones   . Hypercholesteremia   . Hypertension   . Intracranial hemorrhage (HCC)    Right temporal lobe  . LVH (left ventricular hypertrophy) 07/11/2017   Mild, noted on ECHO   . Muscular degeneration   . Nephrolithiasis   . Obesity   . OSA (obstructive sleep apnea)   . Polycythemia   . Prostate cancer ([place]) 2003  . Shingles   . Stroke ([place]) 06/2017   No residual  . Unsteady gait   . Ureteral calculi 02/12/2008   Left    Past Surgical History:  Procedure Laterality Date  . APPENDECTOMY     childhood  . CHOLECYSTECTOMY    . COLONOSCOPY    . HEMORRHOID BANDING  2015  . HOLMIUM LASER APPLICATION Left 68/0/8811   Procedure: HOLMIUM LASER APPLICATION;  Surgeon: Franchot Gallo, MD;  [facility];  Service: Urology;  Laterality: Left;  . IR ANGIO EXTERNAL CAROTID SEL EXT CAROTID UNI R MOD SED  07/10/2017  . IR ANGIO INTRA EXTRACRAN SEL INTERNAL CAROTID BILAT MOD SED  07/10/2017  . IR ANGIO VERTEBRAL SEL VERTEBRAL BILAT MOD SED   07/10/2017  . IR URETERAL STENT LEFT NEW ACCESS W/O SEP NEPHROSTOMY CATH  03/12/2018  . LEFT HEART CATHETERIZATION WITH CORONARY ANGIOGRAM N/A 03/15/2013   Procedure: LEFT HEART CATHETERIZATION WITH CORONARY ANGIOGRAM;  Surgeon: Minus Breeding, MD;  [facility] C[facility] LAB;  Service: Cardiovascular;  Laterality: N/A;  . NEPHROLITHOTOMY Left 03/12/2018   Procedure: LEFT NEPHROLITHOTOMY PERCUTANEOUS, STENT PLACEMENT;  Surgeon: Franchot Gallo, MD;  [facility];  Service: Urology;  Laterality: Left;  . PROSTATECTOMY    . STONE EXTRACTION WITH BASKET      There were no vitals filed for this visit.  Subjective Assessment - 09/28/19 0829    Subjective  Not too much back pain but fatiqued from a busy weekenda    Pertinent History  PMH: HTN, CVA 06/2017, prostate Ca 2003,    Patient Stated Goals  improve gait and conditioning    Pain Onset  More than a month ago                       [facility] Adult PT Treatment/Exercise - 09/28/19 0001      High Level Balance   High Level Balance Comments  SLS on airex 10 sec X 4  Lumbar Exercises: Stretches   Other Lumbar Stretch Exercise  seated L stretch 10 sec X 5, 3 way      Lumbar Exercises: Aerobic   Recumbent Bike  8 min L3      Lumbar Exercises: Machines for Strengthening   Leg Press  Shuttle leg press 112 lbs 3x12 reps      Lumbar Exercises: Standing   Heel Raises Limitations  H/T raises X 20 ea    Other Standing Lumbar Exercises  TRX rows and squats 2x15      Lumbar Exercises: Seated   Sit to Stand Limitations  UE push up from knees with airex pad in chair 2X5      Traction   Type of Traction  Lumbar    Min (lbs)  90    Max (lbs)  105    Hold Time  60    Rest Time  20    Time  15                  PT Long Term Goals - 09/24/19 0904      PT LONG TERM GOAL #1   Title  Pt will report performing HEP, walking program and transition to gym at end of PT    Baseline  compliant with old HEP but progressed  HEP today due to progress and he has not become consistent with going to gym as he just joined.    Time  6    Period  Weeks    Status  On-going      PT LONG TERM GOAL #2   Title  Pt will increaese overall leg strength to at least 4+ to 5- overall to improve function    Baseline  Met 05/12/19    Time  6    Period  Weeks    Status  Achieved      PT LONG TERM GOAL #3   Title  Will improve BERG to at least 53 to show improved balance    Baseline  now 54 on 12/2    Time  6    Period  Weeks    Status  Achieved      PT LONG TERM GOAL #4   Title  Will improve FGA to at least 29 to lower falls risk    Baseline  improved to 28 on 08/31/19    Time  6    Period  Weeks    Status  On-going      PT LONG TERM GOAL #5   Title  Will improve 6 minute walk test to 1560 ft    Baseline  1544 on 3/23    Time  6    Period  Weeks    Status  Achieved            Plan - 09/28/19 0856    Clinical Impression Statement  Continued to address his functional defecits in strength, endurance, balance with exercises listed above. Continued with traciton as this is managing his chronic low back pain. Continue POC.    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    Rehab Potential  Good    PT Frequency  2x / week    PT Duration  6 weeks    PT  Treatment/Interventions  Aquatic Therapy;Cryotherapy;Electrical Stimulation;Moist Heat;Gait training;Stair training;Functional mobility training;Therapeutic activities;Therapeutic exercise;Balance training;Neuromuscular re-education;Manual techniques;Passive range of motion;Taping;Joint Manipulations    PT Next Visit Plan  lumbar stretch and strength, needs gait,  balance, LE strength and conditioning, traction PRN    PT Home Exercise Plan  Access Code: [email or web address], added parial lunges, sit to stands, step ups fwd, lat, retro, also added LTR, SKTC, pelvic tilts, bridges, hip flexor stretch, HSS    Consulted and Agree with Plan of Care  Patient       Patient will benefit from skilled therapeutic intervention in order to improve the following deficits and impairments:  Abnormal gait, Decreased activity tolerance, Decreased balance, Decreased endurance, Decreased strength, Difficulty walking  Visit Diagnosis: Unsteadiness on feet  Muscle weakness (generalized)  Chronic bilateral low back pain without sciatica  Hemiplegia and hemiparesis following nontraumatic intracerebral hemorrhage affecting left non-dominant side [facility])     Problem List Patient Active Problem List   Diagnosis Date Noted  . Calculus of kidney 03/12/2018  . Bleeding internal hemorrhoids 10/09/2017  . Sleep apnea 09/22/2017  . History of adenomatous polyp of colon 09/18/2017  . Prostate cancer ([place]) 09/18/2017  . Essential hypertension 08/26/2017  . Hyperlipidemia 08/26/2017  . Cerebral venous thrombosis 07/13/2017  . Intracerebral hemorrhage 07/10/2017  . ICH (intracerebral hemorrhage) ([place]) 07/10/2017  . Unstable angina ([place]) 03/12/2013    Debbe Odea, PT,DPT 09/28/2019, 9:01 AM  [facility] Physical Therapy [address] [place], [place], [address] Phone: [phone]   Fax:  [phone]  Name: Justin Adams MRN: [id number] Date of Birth: [birth year]

## 2019-09-30 ENCOUNTER — Ambulatory Visit (INDEPENDENT_AMBULATORY_CARE_PROVIDER_SITE_OTHER): Payer: Medicare Other | Admitting: Physical Therapy

## 2019-09-30 ENCOUNTER — Other Ambulatory Visit: Payer: Self-pay

## 2019-09-30 DIAGNOSIS — I69154 Hemiplegia and hemiparesis following nontraumatic intracerebral hemorrhage affecting left non-dominant side: Secondary | ICD-10-CM | POA: Diagnosis not present

## 2019-09-30 DIAGNOSIS — M545 Low back pain: Secondary | ICD-10-CM | POA: Diagnosis not present

## 2019-09-30 DIAGNOSIS — R2681 Unsteadiness on feet: Secondary | ICD-10-CM

## 2019-09-30 DIAGNOSIS — M6281 Muscle weakness (generalized): Secondary | ICD-10-CM | POA: Diagnosis not present

## 2019-09-30 DIAGNOSIS — G8929 Other chronic pain: Secondary | ICD-10-CM | POA: Diagnosis not present

## 2019-09-30 NOTE — Therapy (Signed)
[place], [place], [address] Phone: [phone]   Fax:  [phone]  Physical Therapy Treatment  Patient Details  Name: Justin Adams MRN: [id number] Date of Birth: 1945-12-28 Referring Provider (PT): [name], MD   Encounter Date: 09/30/2019  PT End of Session - 09/30/19 0844    Visit Number  39    Number of Visits  48    Date for PT Re-Evaluation  37/48/27   recert on 0/78 to extend POC to 11/05/19   Authorization Type  MCR AARP    PT Start Time  0800    PT Stop Time  0854    PT Time Calculation (min)  54 min    Activity Tolerance  Patient tolerated treatment well    Behavior During Therapy  [facility] for tasks assessed/performed       Past Medical History:  Diagnosis Date  . Adenomatous polyp   . Bleeding internal hemorrhoids 10/2017  . Cerebral venous thrombosis   . Grade I diastolic dysfunction 67/54/4920   noted on ECHO   . Hemorrhoids   . History of kidney stones   . Hypercholesteremia   . Hypertension   . Intracranial hemorrhage (HCC)    Right temporal lobe  . LVH (left ventricular hypertrophy) 07/11/2017   Mild, noted on ECHO   . Muscular degeneration   . Nephrolithiasis   . Obesity   . OSA (obstructive sleep apnea)   . Polycythemia   . Prostate cancer ([place]) 2003  . Shingles   . Stroke ([place]) 06/2017   No residual  . Unsteady gait   . Ureteral calculi 02/12/2008   Left    Past Surgical History:  Procedure Laterality Date  . APPENDECTOMY     childhood  . CHOLECYSTECTOMY    . COLONOSCOPY    . HEMORRHOID BANDING  2015  . HOLMIUM LASER APPLICATION Left 10/0/7121   Procedure: HOLMIUM LASER APPLICATION;  Surgeon: Franchot Gallo, MD;  [facility];  Service: Urology;  Laterality: Left;  . IR ANGIO EXTERNAL CAROTID SEL EXT CAROTID UNI R MOD SED  07/10/2017  . IR ANGIO INTRA EXTRACRAN SEL INTERNAL CAROTID BILAT MOD SED  07/10/2017  . IR ANGIO VERTEBRAL SEL VERTEBRAL BILAT MOD SED   07/10/2017  . IR URETERAL STENT LEFT NEW ACCESS W/O SEP NEPHROSTOMY CATH  03/12/2018  . LEFT HEART CATHETERIZATION WITH CORONARY ANGIOGRAM N/A 03/15/2013   Procedure: LEFT HEART CATHETERIZATION WITH CORONARY ANGIOGRAM;  Surgeon: Minus Breeding, MD;  Location: [facility] CATH LAB;  Service: Cardiovascular;  Laterality: N/A;  . NEPHROLITHOTOMY Left 03/12/2018   Procedure: LEFT NEPHROLITHOTOMY PERCUTANEOUS, STENT PLACEMENT;  Surgeon: Franchot Gallo, MD;  [facility];  Service: Urology;  Laterality: Left;  . PROSTATECTOMY    . STONE EXTRACTION WITH BASKET      There were no vitals filed for this visit.  Subjective Assessment - 09/30/19 0838    Subjective  "I'm here today, my back was a little achy but its okay"    Pertinent History  PMH: HTN, CVA 06/2017, prostate Ca 2003,    Patient Stated Goals  improve gait and conditioning    Currently in Pain?  Yes    Pain Score  2     Pain Location  Back    Pain Type  Chronic pain    Pain Onset  More than a month ago  [facility] Adult PT Treatment/Exercise - 09/30/19 0001      High Level Balance   High Level Balance Comments  SLS on airex 15 sec X 4      Lumbar Exercises: Stretches   Other Lumbar Stretch Exercise  seated L stretch 10 sec X 5, 3 way      Lumbar Exercises: Aerobic   Tread Mill  8 minutes 2.5 mph      Lumbar Exercises: Machines for Strengthening   Leg Press  Shuttle leg press 112 lbs 2X15 then one set of 12 reps      Lumbar Exercises: Standing   Heel Raises Limitations  H/T raises X 20 ea    Lifting Limitations  deadlift 10 lb from 14 inch steps X 15 reps    Other Standing Lumbar Exercises  TRX rows and squats 2x15      Lumbar Exercises: Seated   Sit to Stand Limitations  UE push up from knees with airex pad in chair 2X5      Traction   Type of Traction  Lumbar    Min (lbs)  90    Max (lbs)  105    Hold Time  60    Rest Time  20    Time  15                  PT Long Term  Goals - 09/24/19 0904      PT LONG TERM GOAL #1   Title  Pt will report performing HEP, walking program and transition to gym at end of PT    Baseline  compliant with old HEP but progressed HEP today due to progress and he has not become consistent with going to gym as he just joined.    Time  6    Period  Weeks    Status  On-going      PT LONG TERM GOAL #2   Title  Pt will increaese overall leg strength to at least 4+ to 5- overall to improve function    Baseline  Met 05/12/19    Time  6    Period  Weeks    Status  Achieved      PT LONG TERM GOAL #3   Title  Will improve BERG to at least 53 to show improved balance    Baseline  now 54 on 12/2    Time  6    Period  Weeks    Status  Achieved      PT LONG TERM GOAL #4   Title  Will improve FGA to at least 29 to lower falls risk    Baseline  improved to 28 on 08/31/19    Time  6    Period  Weeks    Status  On-going      PT LONG TERM GOAL #5   Title  Will improve 6 minute walk test to 1560 ft    Baseline  1544 on 3/23    Time  6    Period  Weeks    Status  Achieved            Plan - 09/30/19 0844    Clinical Impression Statement  He shows good effort with PT and wishes to continue in order to maximize his functional status with balance, endurance, gait, strength, and endurance. Overall his back pain appears to be better managed.    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    Rehab Potential  Good    PT Frequency  2x / week    PT Duration  6 weeks    PT Treatment/Interventions  Aquatic Therapy;Cryotherapy;Electrical Stimulation;Moist Heat;Gait training;Stair training;Functional mobility training;Therapeutic  activities;Therapeutic exercise;Balance training;Neuromuscular re-education;Manual techniques;Passive range of motion;Taping;Joint Manipulations    PT Next Visit Plan  lumbar stretch and strength, needs gait, balance, LE strength and conditioning, traction PRN    PT Home Exercise Plan  Access Code: [email or web address], added parial lunges, sit to stands, step ups fwd, lat, retro, also added LTR, SKTC, pelvic tilts, bridges, hip flexor stretch, HSS    Consulted and Agree with Plan of Care  Patient       Patient will benefit from skilled therapeutic intervention in order to improve the following deficits and impairments:  Abnormal gait, Decreased activity tolerance, Decreased balance, Decreased endurance, Decreased strength, Difficulty walking  Visit Diagnosis: Unsteadiness on feet  Muscle weakness (generalized)  Chronic bilateral low back pain without sciatica  Hemiplegia and hemiparesis following nontraumatic intracerebral hemorrhage affecting left non-dominant side [facility])     Problem List Patient Active Problem List   Diagnosis Date Noted  . Calculus of kidney 03/12/2018  . Bleeding internal hemorrhoids 10/09/2017  . Sleep apnea 09/22/2017  . History of adenomatous polyp of colon 09/18/2017  . Prostate cancer ([place]) 09/18/2017  . Essential hypertension 08/26/2017  . Hyperlipidemia 08/26/2017  . Cerebral venous thrombosis 07/13/2017  . Intracerebral hemorrhage 07/10/2017  . ICH (intracerebral hemorrhage) ([place]) 07/10/2017  . Unstable angina [facility]) 03/12/2013    Silvestre Mesi 09/30/2019, 8:46 AM  [facility] Physical Therapy [address] [place], [place], [address] Phone: [phone]   Fax:  [phone]  Name: FRANCISO DIERKS MRN: [id number] Date of Birth: 06/28/1945

## 2019-10-05 ENCOUNTER — Other Ambulatory Visit: Payer: Self-pay

## 2019-10-05 ENCOUNTER — Ambulatory Visit (INDEPENDENT_AMBULATORY_CARE_PROVIDER_SITE_OTHER): Payer: Medicare Other | Admitting: Physical Therapy

## 2019-10-05 DIAGNOSIS — I69154 Hemiplegia and hemiparesis following nontraumatic intracerebral hemorrhage affecting left non-dominant side: Secondary | ICD-10-CM

## 2019-10-05 DIAGNOSIS — G8929 Other chronic pain: Secondary | ICD-10-CM | POA: Diagnosis not present

## 2019-10-05 DIAGNOSIS — M6281 Muscle weakness (generalized): Secondary | ICD-10-CM

## 2019-10-05 DIAGNOSIS — R2681 Unsteadiness on feet: Secondary | ICD-10-CM | POA: Diagnosis not present

## 2019-10-05 DIAGNOSIS — M545 Low back pain: Secondary | ICD-10-CM

## 2019-10-05 NOTE — Therapy (Signed)
[facility] [address] [place], [place], [address] Phone: [phone]   Fax:  [phone]  Physical Therapy Treatment/Progress note Progress Note reporting period 08/31/19 to 10/05/19  See below for objective and subjective measurements relating to patients progress with PT.   Patient Details  Name: Justin Adams MRN: [id number] Date of Birth: [birth year] Referring Provider (PT): [name], MD   Encounter Date: 10/05/2019  PT End of Session - 10/05/19 1020    Visit Number  40    Number of Visits  62    Date for PT Re-Evaluation  11/05/19    Authorization Type  MCR AARP    PT Start Time  0800    PT Stop Time  0856    PT Time Calculation (min)  56 min    Activity Tolerance  Patient tolerated treatment well    Behavior During Therapy  [facility] for tasks assessed/performed       Past Medical History:  Diagnosis Date  . Adenomatous polyp   . Bleeding internal hemorrhoids 10/2017  . Cerebral venous thrombosis   . Grade I diastolic dysfunction 15/94/5859   noted on ECHO   . Hemorrhoids   . History of kidney stones   . Hypercholesteremia   . Hypertension   . Intracranial hemorrhage (HCC)    Right temporal lobe  . LVH (left ventricular hypertrophy) 07/11/2017   Mild, noted on ECHO   . Muscular degeneration   . Nephrolithiasis   . Obesity   . OSA (obstructive sleep apnea)   . Polycythemia   . Prostate cancer ([place]) 2003  . Shingles   . Stroke ([place]) 06/2017   No residual  . Unsteady gait   . Ureteral calculi 02/12/2008   Left    Past Surgical History:  Procedure Laterality Date  . APPENDECTOMY     childhood  . CHOLECYSTECTOMY    . COLONOSCOPY    . HEMORRHOID BANDING  2015  . HOLMIUM LASER APPLICATION Left 29/07/4460   Procedure: HOLMIUM LASER APPLICATION;  Surgeon: Franchot Gallo, MD;  Location: [facility];  Service: Urology;  Laterality: Left;  . IR ANGIO EXTERNAL CAROTID SEL EXT CAROTID UNI R MOD SED  07/10/2017  . IR  ANGIO INTRA EXTRACRAN SEL INTERNAL CAROTID BILAT MOD SED  07/10/2017  . IR ANGIO VERTEBRAL SEL VERTEBRAL BILAT MOD SED  07/10/2017  . IR URETERAL STENT LEFT NEW ACCESS W/O SEP NEPHROSTOMY CATH  03/12/2018  . LEFT HEART CATHETERIZATION WITH CORONARY ANGIOGRAM N/A 03/15/2013   Procedure: LEFT HEART CATHETERIZATION WITH CORONARY ANGIOGRAM;  Surgeon: Minus Breeding, MD;  Location: [facility] CATH LAB;  Service: Cardiovascular;  Laterality: N/A;  . NEPHROLITHOTOMY Left 03/12/2018   Procedure: LEFT NEPHROLITHOTOMY PERCUTANEOUS, STENT PLACEMENT;  Surgeon: Franchot Gallo, MD;  Location: [facility];  Service: Urology;  Laterality: Left;  . PROSTATECTOMY    . STONE EXTRACTION WITH BASKET      There were no vitals filed for this visit.  Subjective Assessment - 10/05/19 1018    Subjective  My knee is really bothering me, my back is okay today.    Pertinent History  PMH: HTN, CVA 06/2017, prostate Ca 2003,    Patient Stated Goals  improve gait and conditioning    Pain Onset  More than a month ago         [facility] PT Assessment - 10/05/19 0001      Assessment   Medical Diagnosis  Unsteady Gait, LBP    Referring Provider (PT)  [name], MD  Single Leg Stance   Comments  30 second avg today bilat showing improved balance      AROM   Lumbar Flexion  75%    Lumbar Extension  75%    Lumbar - Right Side Bend  75%    Lumbar - Left Side Bend  75%    Lumbar - Right Rotation  75%    Lumbar - Left Rotation  75%      Strength   Overall Strength Comments  knee strength overall 5/5, hip strength overall 5-/5 MMT grossly tested in sitting     Objective exercises/interventions:  Bike 10 minutes seated ball roll outs for lumbar stretching 5  sec X 5 ea way rows, extensions, lat pull all with red sport cord 2X10 ea Deadlift with blue band 2X10 lateral walking with resistance 50 ft up/down X 2 Balance: fwd walking and lateral walking on beam up/down X 5, SLS 30 sec X 5 ea side. Mechanical Lumbar traciton:  15 minutes intermittent 60/20 sec on/off max 110 lbs, min 95 lbs    PT Long Term Goals - 10/05/19 1024      PT LONG TERM GOAL #1   Title  Pt will report performing HEP, walking program and transition to gym at end of PT    Baseline  compliant with old HEP but progressed HEP today due to progress and he has not become consistent with going to gym as he just joined.    Time  6    Period  Weeks    Status  On-going      PT LONG TERM GOAL #2   Title  Pt will increaese overall leg strength to at least 4+ to 5- overall to improve function    Baseline  Met 05/12/19    Time  6    Period  Weeks    Status  Achieved      PT LONG TERM GOAL #3   Title  Will improve BERG to at least 53 to show improved balance    Baseline  now 54 on 12/2    Time  6    Period  Weeks    Status  Achieved      PT LONG TERM GOAL #4   Title  Will improve FGA to at least 29 to lower falls risk    Baseline  improved to 28 on 08/31/19    Time  6    Period  Weeks    Status  On-going      PT LONG TERM GOAL #5   Title  Will improve 6 minute walk test to 1560 ft    Baseline  1544 on 3/23    Time  6    Period  Weeks    Status  Achieved            Plan - 10/05/19 1025    Clinical Impression Statement  Progress note reflects overall improved balance with Single leg stance and overall decreased low back pain from last progress note. He has now met 3/5 long term goals and is close to reaching his other 2 remaining goals. HE will benefit from 8 PT more visits to maximize his function, improve overall leg strength and sit to stand ability from lower chair, and improve ovearall balance to allow him to reach his 2 remaining goals. This was discussed with him and he plans to transition to gym after PT.    Personal Factors and Comorbidities  Comorbidity 1;Comorbidity 2  Comorbidities  PMH: HTN, CVA 06/2017, prostate Ca 2003,    Examination-Activity Limitations  Bend;Squat;Stairs;Stand;Lift;Locomotion Level     Examination-Participation Restrictions  Community Activity;Shop;Yard Work    [employer]  Evolving/Moderate complexity    Rehab Potential  Good    PT Frequency  2x / week    PT Duration  6 weeks    PT Treatment/Interventions  Aquatic Therapy;Cryotherapy;Electrical Stimulation;Moist Heat;Gait training;Stair training;Functional mobility training;Therapeutic activities;Therapeutic exercise;Balance training;Neuromuscular re-education;Manual techniques;Passive range of motion;Taping;Joint Manipulations    PT Next Visit Plan  lumbar stretch and strength, needs gait, balance, LE strength and conditioning, traction PRN    PT Home Exercise Plan  Access Code: [email or web address], added parial lunges, sit to stands, step ups fwd, lat, retro, also added LTR, SKTC, pelvic tilts, bridges, hip flexor stretch, HSS    Consulted and Agree with Plan of Care  Patient       Patient will benefit from skilled therapeutic intervention in order to improve the following deficits and impairments:  Abnormal gait, Decreased activity tolerance, Decreased balance, Decreased endurance, Decreased strength, Difficulty walking  Visit Diagnosis: Unsteadiness on feet  Muscle weakness (generalized)  Chronic bilateral low back pain without sciatica  Hemiplegia and hemiparesis following nontraumatic intracerebral hemorrhage affecting left non-dominant side [facility])     Problem List Patient Active Problem List   Diagnosis Date Noted  . Calculus of kidney 03/12/2018  . Bleeding internal hemorrhoids 10/09/2017  . Sleep apnea 09/22/2017  . History of adenomatous polyp of colon 09/18/2017  . Prostate cancer ([place]) 09/18/2017  . Essential hypertension 08/26/2017  . Hyperlipidemia 08/26/2017  . Cerebral venous thrombosis 07/13/2017  . Intracerebral hemorrhage 07/10/2017  . ICH (intracerebral hemorrhage) ([place]) 07/10/2017  . Unstable angina ([place]) 03/12/2013    Silvestre Mesi 10/05/2019, 10:30 AM  [facility] Physical Therapy [address] [place], [place], [address] Phone: [phone]   Fax:  [phone]  Name: Justin Adams MRN: [id number] Date of Birth: 06/21/1945

## 2019-10-07 ENCOUNTER — Ambulatory Visit (INDEPENDENT_AMBULATORY_CARE_PROVIDER_SITE_OTHER): Payer: Medicare Other | Admitting: Physical Therapy

## 2019-10-07 ENCOUNTER — Other Ambulatory Visit: Payer: Self-pay

## 2019-10-07 DIAGNOSIS — G8929 Other chronic pain: Secondary | ICD-10-CM | POA: Diagnosis not present

## 2019-10-07 DIAGNOSIS — R2681 Unsteadiness on feet: Secondary | ICD-10-CM

## 2019-10-07 DIAGNOSIS — M545 Low back pain: Secondary | ICD-10-CM | POA: Diagnosis not present

## 2019-10-07 DIAGNOSIS — R29818 Other symptoms and signs involving the nervous system: Secondary | ICD-10-CM | POA: Diagnosis not present

## 2019-10-07 DIAGNOSIS — I69154 Hemiplegia and hemiparesis following nontraumatic intracerebral hemorrhage affecting left non-dominant side: Secondary | ICD-10-CM | POA: Diagnosis not present

## 2019-10-07 DIAGNOSIS — M6281 Muscle weakness (generalized): Secondary | ICD-10-CM | POA: Diagnosis not present

## 2019-10-07 NOTE — Therapy (Signed)
[place], [place], [address] Phone: [phone]   Fax:  [phone]  Physical Therapy Treatment  Patient Details  Name: Justin Adams MRN: [id number] Date of Birth: 1946-03-24 Referring Provider (PT): [name], MD   Encounter Date: 10/07/2019  PT End of Session - 10/07/19 0818    Visit Number  41    Number of Visits  48    Date for PT Re-Evaluation  11/05/19    Authorization Type  MCR AARP    PT Start Time  0800    PT Stop Time  0855    PT Time Calculation (min)  55 min    Activity Tolerance  Patient tolerated treatment well    Behavior During Therapy  [facility] for tasks assessed/performed       Past Medical History:  Diagnosis Date  . Adenomatous polyp   . Bleeding internal hemorrhoids 10/2017  . Cerebral venous thrombosis   . Grade I diastolic dysfunction 62/22/9798   noted on ECHO   . Hemorrhoids   . History of kidney stones   . Hypercholesteremia   . Hypertension   . Intracranial hemorrhage (HCC)    Right temporal lobe  . LVH (left ventricular hypertrophy) 07/11/2017   Mild, noted on ECHO   . Muscular degeneration   . Nephrolithiasis   . Obesity   . OSA (obstructive sleep apnea)   . Polycythemia   . Prostate cancer ([place]) 2003  . Shingles   . Stroke ([place]) 06/2017   No residual  . Unsteady gait   . Ureteral calculi 02/12/2008   Left    Past Surgical History:  Procedure Laterality Date  . APPENDECTOMY     childhood  . CHOLECYSTECTOMY    . COLONOSCOPY    . HEMORRHOID BANDING  2015  . HOLMIUM LASER APPLICATION Left 92/06/1939   Procedure: HOLMIUM LASER APPLICATION;  Surgeon: Franchot Gallo, MD;  [facility]: [facility];  Service: Urology;  Laterality: Left;  . IR ANGIO EXTERNAL CAROTID SEL EXT CAROTID UNI R MOD SED  07/10/2017  . IR ANGIO INTRA EXTRACRAN SEL INTERNAL CAROTID BILAT MOD SED  07/10/2017  . IR ANGIO VERTEBRAL SEL VERTEBRAL BILAT MOD SED  07/10/2017  . IR URETERAL STENT LEFT NEW  ACCESS W/O SEP NEPHROSTOMY CATH  03/12/2018  . LEFT HEART CATHETERIZATION WITH CORONARY ANGIOGRAM N/A 03/15/2013   Procedure: LEFT HEART CATHETERIZATION WITH CORONARY ANGIOGRAM;  Surgeon: Minus Breeding, MD;  Location: [facility] CATH LAB;  Service: Cardiovascular;  Laterality: N/A;  . NEPHROLITHOTOMY Left 03/12/2018   Procedure: LEFT NEPHROLITHOTOMY PERCUTANEOUS, STENT PLACEMENT;  Surgeon: Franchot Gallo, MD;  [facility];  Service: Urology;  Laterality: Left;  . PROSTATECTOMY    . STONE EXTRACTION WITH BASKET      There were no vitals filed for this visit.  Subjective Assessment - 10/07/19 0814    Subjective  My knee feels better, my back is only about 1 or 2/10 pain    Pertinent History  PMH: HTN, CVA 06/2017, prostate Ca 2003,    Patient Stated Goals  improve gait and conditioning    Pain Onset  More than a month ago                       [facility] Adult PT Treatment/Exercise - 10/07/19 0001      Transfers   Transfers  Sit to Stand    Sit to Stand  7: Independent    Comments  Sit to stand from  standard ht chair with no UE support for one rep. He normally needs either airex pad or UE support. Able to perform today without either.      High Level Balance   High Level Balance Comments  SLS on airex 15 sec X 4      Lumbar Exercises: Stretches   Other Lumbar Stretch Exercise  seated L stretch 10 sec X 5, 3 way      Lumbar Exercises: Aerobic   Recumbent Bike  8 min L3      Lumbar Exercises: Machines for Strengthening   Leg Press  Shuttle leg press 112 lbs 3X15      Lumbar Exercises: Standing   Heel Raises Limitations  H/T raises X 20 ea    Lifting Limitations  deadlift blue band 2X15    Other Standing Lumbar Exercises  Rows, extensions, lat pull all with red sportcord 2x15 ea      Traction   Type of Traction  Lumbar    Min (lbs)  95    Max (lbs)  110    Hold Time  60    Rest Time  20    Time  15         PT Long Term Goals - 10/05/19 1024      PT LONG  TERM GOAL #1   Title  Pt will report performing HEP, walking program and transition to gym at end of PT    Baseline  compliant with old HEP but progressed HEP today due to progress and he has not become consistent with going to gym as he just joined.    Time  6    Period  Weeks    Status  On-going      PT LONG TERM GOAL #2   Title  Pt will increaese overall leg strength to at least 4+ to 5- overall to improve function    Baseline  Met 05/12/19    Time  6    Period  Weeks    Status  Achieved      PT LONG TERM GOAL #3   Title  Will improve BERG to at least 53 to show improved balance    Baseline  now 54 on 12/2    Time  6    Period  Weeks    Status  Achieved      PT LONG TERM GOAL #4   Title  Will improve FGA to at least 29 to lower falls risk    Baseline  improved to 28 on 08/31/19    Time  6    Period  Weeks    Status  On-going      PT LONG TERM GOAL #5   Title  Will improve 6 minute walk test to 1560 ft    Baseline  1544 on 3/23    Time  6    Period  Weeks    Status  Achieved            Plan - 10/07/19 0841    Clinical Impression Statement  Session continued to address his functional deficits in leg strength, endurance, and balance. He was able to perform sit to stand from standard chair height without UE support for the first time today.    Personal Factors and Comorbidities  Comorbidity 1;Comorbidity 2    Comorbidities  PMH: HTN, CVA 06/2017, prostate Ca 2003,    Examination-Activity Limitations  Bend;Squat;Stairs;Stand;Lift;Locomotion Level    Examination-Participation Restrictions  Community Activity;Shop;Yard Work  Stability/Clinical Decision Making  Evolving/Moderate complexity    Rehab Potential  Good    PT Frequency  2x / week    PT Duration  6 weeks    PT Treatment/Interventions  Aquatic Therapy;Cryotherapy;Electrical Stimulation;Moist Heat;Gait training;Stair training;Functional mobility training;Therapeutic activities;Therapeutic exercise;Balance  training;Neuromuscular re-education;Manual techniques;Passive range of motion;Taping;Joint Manipulations    PT Next Visit Plan  lumbar stretch and strength, needs gait, balance, LE strength and conditioning, traction PRN    PT Home Exercise Plan  Access Code: [email or web address], added parial lunges, sit to stands, step ups fwd, lat, retro, also added LTR, SKTC, pelvic tilts, bridges, hip flexor stretch, HSS    Consulted and Agree with Plan of Care  Patient       Patient will benefit from skilled therapeutic intervention in order to improve the following deficits and impairments:  Abnormal gait, Decreased activity tolerance, Decreased balance, Decreased endurance, Decreased strength, Difficulty walking  Visit Diagnosis: Unsteadiness on feet  Muscle weakness (generalized)  Chronic bilateral low back pain without sciatica  Hemiplegia and hemiparesis following nontraumatic intracerebral hemorrhage affecting left non-dominant side (HCC)  Other symptoms and signs involving the nervous system     Problem List Patient Active Problem List   Diagnosis Date Noted  . Calculus of kidney 03/12/2018  . Bleeding internal hemorrhoids 10/09/2017  . Sleep apnea 09/22/2017  . History of adenomatous polyp of colon 09/18/2017  . Prostate cancer ([place]) 09/18/2017  . Essential hypertension 08/26/2017  . Hyperlipidemia 08/26/2017  . Cerebral venous thrombosis 07/13/2017  . Intracerebral hemorrhage 07/10/2017  . ICH (intracerebral hemorrhage) ([place]) 07/10/2017  . Unstable angina [facility]) 03/12/2013    Silvestre Mesi 10/07/2019, 8:45 AM  [facility] Physical Therapy [address] [place], [place], [address] Phone: [phone]   Fax:  [phone]  Name: JAZON JIPSON MRN: [id number] Date of Birth: 1945-12-02

## 2019-10-11 ENCOUNTER — Other Ambulatory Visit: Payer: Self-pay

## 2019-10-11 ENCOUNTER — Encounter (INDEPENDENT_AMBULATORY_CARE_PROVIDER_SITE_OTHER): Payer: Medicare Other | Admitting: Ophthalmology

## 2019-10-11 DIAGNOSIS — H26491 Other secondary cataract, right eye: Secondary | ICD-10-CM | POA: Insufficient documentation

## 2019-10-11 DIAGNOSIS — H43812 Vitreous degeneration, left eye: Secondary | ICD-10-CM | POA: Insufficient documentation

## 2019-10-11 DIAGNOSIS — H353221 Exudative age-related macular degeneration, left eye, with active choroidal neovascularization: Secondary | ICD-10-CM | POA: Insufficient documentation

## 2019-10-11 DIAGNOSIS — H33312 Horseshoe tear of retina without detachment, left eye: Secondary | ICD-10-CM | POA: Insufficient documentation

## 2019-10-11 DIAGNOSIS — H35431 Paving stone degeneration of retina, right eye: Secondary | ICD-10-CM | POA: Insufficient documentation

## 2019-10-11 DIAGNOSIS — H353132 Nonexudative age-related macular degeneration, bilateral, intermediate dry stage: Secondary | ICD-10-CM | POA: Insufficient documentation

## 2019-10-11 NOTE — Telephone Encounter (Signed)
Just checking to see if this has been followed up on so encounter can be closed.

## 2019-10-11 NOTE — Telephone Encounter (Signed)
Yes, can close

## 2019-10-12 ENCOUNTER — Other Ambulatory Visit: Payer: Self-pay

## 2019-10-12 ENCOUNTER — Ambulatory Visit (INDEPENDENT_AMBULATORY_CARE_PROVIDER_SITE_OTHER): Payer: Medicare Other | Admitting: Ophthalmology

## 2019-10-12 ENCOUNTER — Encounter (INDEPENDENT_AMBULATORY_CARE_PROVIDER_SITE_OTHER): Payer: Self-pay | Admitting: Ophthalmology

## 2019-10-12 DIAGNOSIS — H353132 Nonexudative age-related macular degeneration, bilateral, intermediate dry stage: Secondary | ICD-10-CM

## 2019-10-12 DIAGNOSIS — H43812 Vitreous degeneration, left eye: Secondary | ICD-10-CM

## 2019-10-12 DIAGNOSIS — H26491 Other secondary cataract, right eye: Secondary | ICD-10-CM

## 2019-10-12 DIAGNOSIS — H35431 Paving stone degeneration of retina, right eye: Secondary | ICD-10-CM

## 2019-10-12 DIAGNOSIS — H33312 Horseshoe tear of retina without detachment, left eye: Secondary | ICD-10-CM | POA: Diagnosis not present

## 2019-10-12 DIAGNOSIS — H353221 Exudative age-related macular degeneration, left eye, with active choroidal neovascularization: Secondary | ICD-10-CM

## 2019-10-12 MED ORDER — BEVACIZUMAB CHEMO INJECTION 1.25MG/0.05ML SYRINGE FOR KALEIDOSCOPE
1.2500 mg | INTRAVITREAL | Status: AC | PRN
  Administered 2019-10-12: 1.25 mg via INTRAVITREAL

## 2019-10-12 NOTE — Assessment & Plan Note (Addendum)
History of subfoveal large CNVM onset early 2020.  Vastly improved anatomic structure on longstanding antiveg F therapy.  Now at 6-week exam interval.  Repeat Avastin OS today and examination in 7 weeks

## 2019-10-12 NOTE — Progress Notes (Signed)
10/12/2019     CHIEF COMPLAINT Patient presents for Retina Follow Up   HISTORY OF PRESENT ILLNESS: Justin Adams is a 74 y.o. male who presents to the clinic today for:   HPI    Retina Follow Up    Patient presents with  Wet AMD.  In left eye.  This started 6 weeks ago.  Severity is mild.  Duration of 6 weeks.  Since onset it is stable.          Comments    6 Week AMD F/U OS, poss Avastin OS  Pt denies noticeable changes to [place] OU since last visit. Pt denies ocular pain, flashes of light, or floaters OU.         Last edited by Rockie Neighbours, [place] on 10/12/2019  8:29 AM. (History)      Referring physician: [name], MD [address] [place],  [place] [address]  HISTORICAL INFORMATION:   Selected notes from the MEDICAL RECORD NUMBER    Lab Results  Component Value Date   HGBA1C 5.3 07/11/2017     CURRENT MEDICATIONS: No current outpatient medications on file. (Ophthalmic Drugs)   No current facility-administered medications for this visit. (Ophthalmic Drugs)   Current Outpatient Medications (Other)  Medication Sig  . acetaminophen (TYLENOL) 500 MG tablet Take 500-1,000 mg by mouth daily as needed for moderate pain or headache.  Marland Kitchen amLODipine (NORVASC) 2.5 MG tablet Take 1 tablet (2.5 mg total) by mouth daily as needed (if systolic bp is over [id number]).  Marland Kitchen apixaban (ELIQUIS) 5 MG TABS tablet Take 1 tablet (5 mg total) by mouth 2 (two) times daily.  Marland Kitchen atorvastatin (LIPITOR) 20 MG tablet Take 1 tablet (20 mg total) by mouth daily.  Marland Kitchen lisinopril (PRINIVIL,ZESTRIL) 20 MG tablet Take 1 tablet (20 mg total) by mouth daily.  . Melatonin 3 MG TABS Take 3 mg by mouth at bedtime as needed (sleep).  . polyethylene glycol (MIRALAX / GLYCOLAX) packet Take 17 g by mouth at bedtime.  . Wheat Dextrin (BENEFIBER) POWD Take 1 Dose by mouth daily.   No current facility-administered medications for this visit. (Other)      REVIEW OF SYSTEMS:    ALLERGIES No  Known Allergies  PAST MEDICAL HISTORY Past Medical History:  Diagnosis Date  . Adenomatous polyp   . Bleeding internal hemorrhoids 10/2017  . Cerebral venous thrombosis   . Grade I diastolic dysfunction 0000000   noted on ECHO   . Hemorrhoids   . History of kidney stones   . Hypercholesteremia   . Hypertension   . Intracranial hemorrhage (HCC)    Right temporal lobe  . LVH (left ventricular hypertrophy) 07/11/2017   Mild, noted on ECHO   . Muscular degeneration   . Nephrolithiasis   . Obesity   . OSA (obstructive sleep apnea)   . Polycythemia   . Prostate cancer ([place]) 2003  . Shingles   . Stroke ([place]) 06/2017   No residual  . Unsteady gait   . Ureteral calculi 02/12/2008   Left   Past Surgical History:  Procedure Laterality Date  . APPENDECTOMY     childhood  . CHOLECYSTECTOMY    . COLONOSCOPY    . HEMORRHOID BANDING  2015  . HOLMIUM LASER APPLICATION Left [id number]   Procedure: HOLMIUM LASER APPLICATION;  Surgeon: Franchot Gallo, MD;  Location: [facility];  Service: Urology;  Laterality: Left;  . IR ANGIO EXTERNAL CAROTID SEL EXT CAROTID UNI R MOD SED  07/10/2017  . IR ANGIO INTRA EXTRACRAN SEL INTERNAL CAROTID BILAT MOD SED  07/10/2017  . IR ANGIO VERTEBRAL SEL VERTEBRAL BILAT MOD SED  07/10/2017  . IR URETERAL STENT LEFT NEW ACCESS W/O SEP NEPHROSTOMY CATH  03/12/2018  . LEFT HEART CATHETERIZATION WITH CORONARY ANGIOGRAM N/A 03/15/2013   Procedure: LEFT HEART CATHETERIZATION WITH CORONARY ANGIOGRAM;  Surgeon: Minus Breeding, MD;  Location: [facility] CATH LAB;  Service: Cardiovascular;  Laterality: N/A;  . NEPHROLITHOTOMY Left 03/12/2018   Procedure: LEFT NEPHROLITHOTOMY PERCUTANEOUS, STENT PLACEMENT;  Surgeon: Franchot Gallo, MD;  Location: [facility];  Service: Urology;  Laterality: Left;  . PROSTATECTOMY    . STONE EXTRACTION WITH BASKET      FAMILY HISTORY Family History  Problem Relation Age of Onset  . Hypertension Mother   . Coronary artery disease Mother 35  .  Breast cancer Mother   . Coronary artery disease Father 67       Died age 8  . CAD Brother 75    SOCIAL HISTORY Social History   Tobacco Use  . Smoking status: Never Smoker  . Smokeless tobacco: Never Used  Substance Use Topics  . Alcohol use: No  . Drug use: No         OPHTHALMIC EXAM:  Base Eye Exam    Visual Acuity (ETDRS)      Right Left   Dist cc 20/25 +2 CF at 3'   Dist ph cc  20/400   Correction: Glasses       Tonometry (Tonopen, 8:32 AM)      Right Left   Pressure 17 19       Pupils      Pupils Dark Light Shape React APD   Right PERRL 4 3 Round Brisk None   Left PERRL 4 3 Round Brisk None       Visual Fields (Counting fingers)      Left Right    Full Full       Extraocular Movement      Right Left    Full Full       Neuro/Psych    Oriented x3: Yes   Mood/Affect: Normal       Dilation    Left eye: 1.0% Mydriacyl, 2.5% Phenylephrine @ 8:33 AM        Slit Lamp and Fundus Exam    External Exam      Right Left   External Normal Normal       Slit Lamp Exam      Right Left   Lids/Lashes Normal Normal   Conjunctiva/Sclera White and quiet White and quiet   Cornea Clear Clear   Anterior Chamber Deep and quiet Deep and quiet   Iris Round and reactive Round and reactive   Lens Posterior chamber intraocular lens Posterior chamber intraocular lens   Anterior Vitreous Normal Normal       Fundus Exam      Right Left   C/D Ratio  0.45   Macula  Macular thickening, Subretinal neovascular membrane, Retinal pigment epithelial mottling, Subretinal fibrosis, Hard drusen, Disciform scar, centrally with no subretinal hemorrhage peripheral scarring          IMAGING AND PROCEDURES  Imaging and Procedures for 10/12/19  OCT, Retina - OU - Both Eyes       Right Eye Quality was good. Scan locations included subfoveal. Central Foveal Thickness: 292. Progression has been stable. Findings include retinal drusen .   Left Eye Quality was good.  Scan  locations included subfoveal. Central Foveal Thickness: 293. Progression has improved. Findings include abnormal foveal contour, subretinal fluid, disciform scar, cystoid macular edema.   Notes OS, overall improved with regions nasal and inferior to the foveal region with less edema and thickening.  Visual acuity OS limited by subfoveal disciform scar. OD with no active lesions        Intravitreal Injection, Pharmacologic Agent - OS - Left Eye       Time Out 10/12/2019. 9:56 AM. Confirmed correct patient, procedure, site, and patient consented.   Anesthesia Topical anesthesia was used. Anesthetic medications included Akten 3.5%.   Procedure Preparation included Tobramycin 0.3%, 10% betadine to eyelids. A 30 gauge needle was used.   Injection:  1.25 mg Bevacizumab (AVASTIN) SOLN   NDC: [id number], [id number]   Route: Intravitreal, Site: Left Eye, Waste: 0 mg  Post-op Post injection exam found visual acuity of at least counting fingers. The patient tolerated the procedure well. There were no complications. The patient received written and verbal post procedure care education. Post injection medications were not given.                 ASSESSMENT/PLAN:  Exudative age-related macular degeneration of left eye with active choroidal neovascularization ([place]) History of subfoveal large CNVM onset early 2020.  Vastly improved anatomic structure on longstanding antiveg F therapy.  Now at 6-week exam interval.  Repeat Avastin OS today and examination in 7 weeks  Posterior vitreous detachment of left eye   The nature of posterior vitreous detachment was discussed with the patient as well as its physiology, its age prevalence, and its possible implication regarding retinal breaks and detachment.  An informational brochure was given to the patient.  All the patient's questions were answered.  The patient was asked to return if new or different flashes or floaters develops.    Patient was instructed to contact office immediately if any changes were noticed. I explained to the patient that vitreous inside the eye is similar to jello inside a bowl. As the jello melts it can start to pull away from the bowl, similarly the vitreous throughout our lives can begin to pull away from the retina. That process is called a posterior vitreous detachment. In some cases, the vitreous can tug hard enough on the retina to form a retinal tear. I discussed with the patient the signs and symptoms of a retinal detachment.  Do not rub the eye.      ICD-10-CM   1. Exudative age-related macular degeneration of left eye with active choroidal neovascularization (HCC)  H35.3221 OCT, Retina - OU - Both Eyes    Intravitreal Injection, Pharmacologic Agent - OS - Left Eye    Bevacizumab (AVASTIN) SOLN 1.25 mg  2. Intermediate stage nonexudative age-related macular degeneration of both eyes  H35.3132   3. Operculated retinal tear of left eye  H33.312   4. Right posterior capsular opacification  H26.491   5. Posterior vitreous detachment of left eye  H43.812   6. Paving stone retinal degeneration of right eye  H35.431     1.  2.  3.  Ophthalmic Meds Ordered this visit:  Meds ordered this encounter  Medications  . Bevacizumab (AVASTIN) SOLN 1.25 mg       Return in about 7 weeks (around 11/30/2019) for AVASTIN OCT, OS.  There are no Patient Instructions on file for this visit.   Explained the diagnoses, plan, and follow up with the patient and they expressed understanding.  Patient  expressed understanding of the importance of proper follow up care.   Clent Demark Mariselda Badalamenti M.D. Diseases & Surgery of the Retina and Vitreous [facility] [place] 10/12/19     Abbreviations: M myopia (nearsighted); A astigmatism; H hyperopia (farsighted); P presbyopia; Mrx spectacle prescription;  CTL contact lenses; OD right eye; OS left eye; OU both eyes  XT exotropia; ET esotropia; PEK punctate  epithelial keratitis; PEE punctate epithelial erosions; DES dry eye syndrome; MGD meibomian gland dysfunction; ATs artificial tears; PFAT's preservative free artificial tears; [place] nuclear sclerotic cataract; PSC posterior subcapsular cataract; ERM epi-retinal membrane; PVD posterior vitreous detachment; RD retinal detachment; DM diabetes mellitus; DR diabetic retinopathy; NPDR non-proliferative diabetic retinopathy; PDR proliferative diabetic retinopathy; CSME clinically significant macular edema; DME diabetic macular edema; dbh dot blot hemorrhages; CWS cotton wool spot; POAG primary open angle glaucoma; C/D cup-to-disc ratio; HVF humphrey visual field; GVF goldmann visual field; OCT optical coherence tomography; IOP intraocular pressure; BRVO Branch retinal vein occlusion; CRVO central retinal vein occlusion; CRAO central retinal artery occlusion; BRAO branch retinal artery occlusion; RT retinal tear; SB scleral buckle; PPV pars plana vitrectomy; VH Vitreous hemorrhage; PRP panretinal laser photocoagulation; IVK intravitreal kenalog; VMT vitreomacular traction; MH Macular hole;  NVD neovascularization of the disc; NVE neovascularization elsewhere; AREDS age related eye disease study; ARMD age related macular degeneration; POAG primary open angle glaucoma; EBMD epithelial/anterior basement membrane dystrophy; ACIOL anterior chamber intraocular lens; IOL intraocular lens; PCIOL posterior chamber intraocular lens; Phaco/IOL phacoemulsification with intraocular lens placement; [place] photorefractive keratectomy; LASIK laser assisted in situ keratomileusis; HTN hypertension; DM diabetes mellitus; COPD chronic obstructive pulmonary disease

## 2019-10-12 NOTE — Assessment & Plan Note (Signed)

## 2019-10-13 ENCOUNTER — Ambulatory Visit (INDEPENDENT_AMBULATORY_CARE_PROVIDER_SITE_OTHER): Payer: Medicare Other | Admitting: Physical Therapy

## 2019-10-13 DIAGNOSIS — G8929 Other chronic pain: Secondary | ICD-10-CM

## 2019-10-13 DIAGNOSIS — M6281 Muscle weakness (generalized): Secondary | ICD-10-CM | POA: Diagnosis not present

## 2019-10-13 DIAGNOSIS — I69154 Hemiplegia and hemiparesis following nontraumatic intracerebral hemorrhage affecting left non-dominant side: Secondary | ICD-10-CM

## 2019-10-13 DIAGNOSIS — M545 Low back pain, unspecified: Secondary | ICD-10-CM

## 2019-10-13 DIAGNOSIS — R2681 Unsteadiness on feet: Secondary | ICD-10-CM

## 2019-10-13 DIAGNOSIS — R29818 Other symptoms and signs involving the nervous system: Secondary | ICD-10-CM | POA: Diagnosis not present

## 2019-10-13 NOTE — Therapy (Signed)
[facility] [name], [place], [address] Phone: [phone]   Fax:  [phone]  Physical Therapy Treatment  Patient Details  Name: Justin Adams MRN: [id number] Date of Birth: [birth year] Referring Provider (PT): [name], MD   Encounter Date: 10/13/2019  PT End of Session - 10/13/19 1055    Visit Number  42    Number of Visits  48    Date for PT Re-Evaluation  11/05/19    Authorization Type  MCR AARP    PT Start Time  1010    PT Stop Time  1105    PT Time Calculation (min)  55 min    Activity Tolerance  Patient tolerated treatment well    Behavior During Therapy  [facility] for tasks assessed/performed       Past Medical History:  Diagnosis Date  . Adenomatous polyp   . Bleeding internal hemorrhoids 10/2017  . Cerebral venous thrombosis   . Grade I diastolic dysfunction 20/25/4270   noted on ECHO   . Hemorrhoids   . History of kidney stones   . Hypercholesteremia   . Hypertension   . Intracranial hemorrhage (HCC)    Right temporal lobe  . LVH (left ventricular hypertrophy) 07/11/2017   Mild, noted on ECHO   . Muscular degeneration   . Nephrolithiasis   . Obesity   . OSA (obstructive sleep apnea)   . Polycythemia   . Prostate cancer ([place]) 2003  . Shingles   . Stroke ([place]) 06/2017   No residual  . Unsteady gait   . Ureteral calculi 02/12/2008   Left    Past Surgical History:  Procedure Laterality Date  . APPENDECTOMY     childhood  . CHOLECYSTECTOMY    . COLONOSCOPY    . HEMORRHOID BANDING  2015  . HOLMIUM LASER APPLICATION Left 62/08/7626   Procedure: HOLMIUM LASER APPLICATION;  Surgeon: Franchot Gallo, MD;  Location: [facility];  Service: Urology;  Laterality: Left;  . IR ANGIO EXTERNAL CAROTID SEL EXT CAROTID UNI R MOD SED  07/10/2017  . IR ANGIO INTRA EXTRACRAN SEL INTERNAL CAROTID BILAT MOD SED  07/10/2017  . IR ANGIO VERTEBRAL SEL VERTEBRAL BILAT MOD SED  07/10/2017  . IR URETERAL STENT LEFT NEW  ACCESS W/O SEP NEPHROSTOMY CATH  03/12/2018  . LEFT HEART CATHETERIZATION WITH CORONARY ANGIOGRAM N/A 03/15/2013   Procedure: LEFT HEART CATHETERIZATION WITH CORONARY ANGIOGRAM;  Surgeon: Minus Breeding, MD;  Location: [facility] CATH LAB;  Service: Cardiovascular;  Laterality: N/A;  . NEPHROLITHOTOMY Left 03/12/2018   Procedure: LEFT NEPHROLITHOTOMY PERCUTANEOUS, STENT PLACEMENT;  Surgeon: Franchot Gallo, MD;  Location: [facility];  Service: Urology;  Laterality: Left;  . PROSTATECTOMY    . STONE EXTRACTION WITH BASKET      There were no vitals filed for this visit.  Subjective Assessment - 10/13/19 1047    Subjective  Im doing pretty good today, my back and knees are about 2/10 pain, I have been going to gym now    Pertinent History  PMH: HTN, CVA 06/2017, prostate Ca 2003,    Patient Stated Goals  improve gait and conditioning    Pain Onset  More than a month ago         [facility] PT Assessment - 10/13/19 0001      Sit to Stand   Comments  able to perform 3 reps without UE support                   [facility]  Adult PT Treatment/Exercise - 10/13/19 0001      High Level Balance   High Level Balance Comments  SLS on airex 20 sec X 3 bilat      Lumbar Exercises: Stretches   Other Lumbar Stretch Exercise  seated L stretch 10 sec X 5, 3 way      Lumbar Exercises: Aerobic   Recumbent Bike  10 min L2      Lumbar Exercises: Standing   Other Standing Lumbar Exercises  Rows, extensions, lat pull all with red sportcord 2x15 ea    Other Standing Lumbar Exercises  TRX squats 2X10      Lumbar Exercises: Seated   Sit to Stand Limitations  from standard chair, no UE support 3 reps      Traction   Type of Traction  Lumbar    Min (lbs)  95    Max (lbs)  110    Hold Time  60    Rest Time  20    Time  15                  PT Long Term Goals - 10/05/19 1024      PT LONG TERM GOAL #1   Title  Pt will report performing HEP, walking program and transition to gym at end of PT     Baseline  compliant with old HEP but progressed HEP today due to progress and he has not become consistent with going to gym as he just joined.    Time  6    Period  Weeks    Status  On-going      PT LONG TERM GOAL #2   Title  Pt will increaese overall leg strength to at least 4+ to 5- overall to improve function    Baseline  Met 05/12/19    Time  6    Period  Weeks    Status  Achieved      PT LONG TERM GOAL #3   Title  Will improve BERG to at least 53 to show improved balance    Baseline  now 54 on 12/2    Time  6    Period  Weeks    Status  Achieved      PT LONG TERM GOAL #4   Title  Will improve FGA to at least 29 to lower falls risk    Baseline  improved to 28 on 08/31/19    Time  6    Period  Weeks    Status  On-going      PT LONG TERM GOAL #5   Title  Will improve 6 minute walk test to 1560 ft    Baseline  1544 on 3/23    Time  6    Period  Weeks    Status  Achieved            Plan - 10/13/19 1056    Clinical Impression Statement  He showed improvements in leg strength and was able to perform sit to stand without UE support for 3 reps, only able to do this one rep last week. Continued with traction as this is managing his pain. He appears to be more compliant with exercise outside of PT and is now going to gym. PT recommending continuing PT until end of may to address his remaining functional deficits and transition to final HEP.    Personal Factors and Comorbidities  Comorbidity 1;Comorbidity 2    Comorbidities  PMH: HTN,  CVA 06/2017, prostate Ca 2003,    Examination-Activity Limitations  Bend;Squat;Stairs;Stand;Lift;Locomotion Level    Examination-Participation Restrictions  Community Activity;Shop;Yard Work    [employer]  Evolving/Moderate complexity    Rehab Potential  Good    PT Frequency  2x / week    PT Duration  6 weeks    PT Treatment/Interventions  Aquatic Therapy;Cryotherapy;Electrical Stimulation;Moist Heat;Gait training;Stair  training;Functional mobility training;Therapeutic activities;Therapeutic exercise;Balance training;Neuromuscular re-education;Manual techniques;Passive range of motion;Taping;Joint Manipulations    PT Next Visit Plan  lumbar stretch and strength, needs gait, balance, LE strength and conditioning, traction PRN    PT Home Exercise Plan  Access Code: [email or web address], added parial lunges, sit to stands, step ups fwd, lat, retro, also added LTR, SKTC, pelvic tilts, bridges, hip flexor stretch, HSS    Consulted and Agree with Plan of Care  Patient       Patient will benefit from skilled therapeutic intervention in order to improve the following deficits and impairments:  Abnormal gait, Decreased activity tolerance, Decreased balance, Decreased endurance, Decreased strength, Difficulty walking  Visit Diagnosis: Unsteadiness on feet  Muscle weakness (generalized)  Chronic bilateral low back pain without sciatica  Hemiplegia and hemiparesis following nontraumatic intracerebral hemorrhage affecting left non-dominant side (HCC)  Other symptoms and signs involving the nervous system     Problem List Patient Active Problem List   Diagnosis Date Noted  . Exudative age-related macular degeneration of left eye with active choroidal neovascularization ([place]) 10/11/2019  . Intermediate stage nonexudative age-related macular degeneration of both eyes 10/11/2019  . Operculated retinal tear of left eye 10/11/2019  . Right posterior capsular opacification 10/11/2019  . Posterior vitreous detachment of left eye 10/11/2019  . Paving stone retinal degeneration of right eye 10/11/2019  . Calculus of kidney 03/12/2018  . Bleeding internal hemorrhoids 10/09/2017  . Sleep apnea 09/22/2017  . History of adenomatous polyp of colon 09/18/2017  . Prostate cancer ([place]) 09/18/2017  . Essential hypertension 08/26/2017  . Hyperlipidemia 08/26/2017  . Cerebral venous thrombosis 07/13/2017  . Intracerebral hemorrhage  07/10/2017  . ICH (intracerebral hemorrhage) ([place]) 07/10/2017  . Unstable angina ([place]) 03/12/2013    Silvestre Mesi 10/13/2019, 11:00 AM  [facility] [address] [place], [place], [address] Phone: [phone]   Fax:  [phone]  Name: WOODROW DRAB MRN: [id number] Date of Birth: 1946-03-04

## 2019-10-15 ENCOUNTER — Other Ambulatory Visit: Payer: Self-pay

## 2019-10-15 ENCOUNTER — Ambulatory Visit (INDEPENDENT_AMBULATORY_CARE_PROVIDER_SITE_OTHER): Payer: Medicare Other | Admitting: Physical Therapy

## 2019-10-15 DIAGNOSIS — M545 Low back pain: Secondary | ICD-10-CM | POA: Diagnosis not present

## 2019-10-15 DIAGNOSIS — R2681 Unsteadiness on feet: Secondary | ICD-10-CM

## 2019-10-15 DIAGNOSIS — R29818 Other symptoms and signs involving the nervous system: Secondary | ICD-10-CM

## 2019-10-15 DIAGNOSIS — M6281 Muscle weakness (generalized): Secondary | ICD-10-CM | POA: Diagnosis not present

## 2019-10-15 DIAGNOSIS — I69154 Hemiplegia and hemiparesis following nontraumatic intracerebral hemorrhage affecting left non-dominant side: Secondary | ICD-10-CM

## 2019-10-15 DIAGNOSIS — G8929 Other chronic pain: Secondary | ICD-10-CM | POA: Diagnosis not present

## 2019-10-15 NOTE — Therapy (Signed)
[facility] [place], [place], [address] Phone: [phone]   Fax:  [phone]  Physical Therapy Treatment  Patient Details  Name: Justin Adams MRN: [id number] Date of Birth: 1945/12/28 Referring Provider (PT): [name], MD   Encounter Date: 10/15/2019  PT End of Session - 10/15/19 0908    Visit Number  43    Number of Visits  48    Date for PT Re-Evaluation  11/05/19    Authorization Type  MCR AARP    PT Start Time  0800    PT Stop Time  0900    PT Time Calculation (min)  60 min    Activity Tolerance  Patient tolerated treatment well    Behavior During Therapy  [facility] for tasks assessed/performed       Past Medical History:  Diagnosis Date  . Adenomatous polyp   . Bleeding internal hemorrhoids 10/2017  . Cerebral venous thrombosis   . Grade I diastolic dysfunction 56/97/9480   noted on ECHO   . Hemorrhoids   . History of kidney stones   . Hypercholesteremia   . Hypertension   . Intracranial hemorrhage (HCC)    Right temporal lobe  . LVH (left ventricular hypertrophy) 07/11/2017   Mild, noted on ECHO   . Muscular degeneration   . Nephrolithiasis   . Obesity   . OSA (obstructive sleep apnea)   . Polycythemia   . Prostate cancer ([place]) 2003  . Shingles   . Stroke ([place]) 06/2017   No residual  . Unsteady gait   . Ureteral calculi 02/12/2008   Left    Past Surgical History:  Procedure Laterality Date  . APPENDECTOMY     childhood  . CHOLECYSTECTOMY    . COLONOSCOPY    . HEMORRHOID BANDING  2015  . HOLMIUM LASER APPLICATION Left 16/10/5372   Procedure: HOLMIUM LASER APPLICATION;  Surgeon: Franchot Gallo, MD;  Location: [facility];  Service: Urology;  Laterality: Left;  . IR ANGIO EXTERNAL CAROTID SEL EXT CAROTID UNI R MOD SED  07/10/2017  . IR ANGIO INTRA EXTRACRAN SEL INTERNAL CAROTID BILAT MOD SED  07/10/2017  . IR ANGIO VERTEBRAL SEL VERTEBRAL BILAT MOD SED  07/10/2017  . IR URETERAL STENT LEFT NEW  ACCESS W/O SEP NEPHROSTOMY CATH  03/12/2018  . LEFT HEART CATHETERIZATION WITH CORONARY ANGIOGRAM N/A 03/15/2013   Procedure: LEFT HEART CATHETERIZATION WITH CORONARY ANGIOGRAM;  Surgeon: Minus Breeding, MD;  Location: [facility] CATH LAB;  Service: Cardiovascular;  Laterality: N/A;  . NEPHROLITHOTOMY Left 03/12/2018   Procedure: LEFT NEPHROLITHOTOMY PERCUTANEOUS, STENT PLACEMENT;  Surgeon: Franchot Gallo, MD;  Location: [facility];  Service: Urology;  Laterality: Left;  . PROSTATECTOMY    . STONE EXTRACTION WITH BASKET      There were no vitals filed for this visit.  Subjective Assessment - 10/15/19 0854    Subjective  My back is doing pretty good, most of the pain is first thing in the morning then when I get going it feels better. He noticed he is having trouble turning his head to the left when driving.    Pertinent History  PMH: HTN, CVA 06/2017, prostate Ca 2003,    Patient Stated Goals  improve gait and conditioning    Pain Onset  More than a month ago         [facility] PT Assessment - 10/15/19 0001      Assessment   Medical Diagnosis  Unsteady Gait, LBP      AROM  Lumbar Flexion  75%    Lumbar Extension  25%    Lumbar - Right Side Bend  75%    Lumbar - Left Side Bend  75%    Lumbar - Right Rotation  75%    Lumbar - Left Rotation  75%      Strength   Overall Strength Comments  knee strength overall 5/5, hip strength overall 5-/5 MMT grossly tested in sitting                   [facility] Adult PT Treatment/Exercise - 10/15/19 0001      High Level Balance   High Level Balance Comments  SLS on airex 20 sec X 3 bilat      Lumbar Exercises: Stretches   Other Lumbar Stretch Exercise  standing L stretch 10 sec X 10      Lumbar Exercises: Aerobic   Recumbent Bike  10 min L2      Lumbar Exercises: Machines for Strengthening   Leg Press  Shuttle leg press 112 lbs 3X15      Lumbar Exercises: Standing   Other Standing Lumbar Exercises  Rows, extensions, lat pull all with red  sportcord 2x15 ea    Other Standing Lumbar Exercises  farmers carry 10 lbs 2 laps each arm. Standing cervical AAROM with towel to Lt 5 sec X 10      Lumbar Exercises: Seated   Sit to Stand Limitations  from standard chair, no UE support 3 reps, then with airex pad underneath no UE 2X5      Traction   Type of Traction  Lumbar    Min (lbs)  95    Max (lbs)  110    Hold Time  60    Rest Time  20    Time  15             PT Education - 10/15/19 0907    Education Details  added neck stretch to assist with turning his head to left    Person(s) Educated  Patient    Methods  Explanation;Demonstration;Verbal cues    Comprehension  Verbalized understanding;Returned demonstration          PT Long Term Goals - 10/15/19 0912      PT LONG TERM GOAL #1   Title  s    Baseline  compliant with old HEP but progressed HEP today due to progress and he has not become consistent with going to gym as he just joined.    Time  6    Period  Weeks    Status  On-going      PT LONG TERM GOAL #2   Title  Pt will increaese overall leg strength to at least 4+ to 5- overall to improve function    Baseline  Met 05/12/19    Time  6    Period  Weeks    Status  Achieved      PT LONG TERM GOAL #3   Title  Will improve BERG to at least 53 to show improved balance    Baseline  now 54 on 12/2    Time  6    Period  Weeks    Status  Achieved      PT LONG TERM GOAL #4   Title  Will improve FGA to at least 29 to lower falls risk    Baseline  improved to 28 on 08/31/19    Time  6    Period  Weeks    Status  On-going      PT LONG TERM GOAL #5   Title  Will improve 6 minute walk test to 1560 ft    Baseline  1544 on 3/23    Time  6    Period  Weeks    Status  Achieved            Plan - 10/15/19 0908    Clinical Impression Statement  Doing well with lumbar ROM except for extension but due to stenosis this will likely always be limited. Leg strength doing well also. Did add neck stretch as he  is having trouble looking to his left. Continued with traction as this is managing his pain very well.    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    Rehab Potential  Good    PT Frequency  2x / week    PT Duration  6 weeks    PT Treatment/Interventions  Aquatic Therapy;Cryotherapy;Electrical Stimulation;Moist Heat;Gait training;Stair training;Functional mobility training;Therapeutic activities;Therapeutic exercise;Balance training;Neuromuscular re-education;Manual techniques;Passive range of motion;Taping;Joint Manipulations    PT Next Visit Plan  lumbar stretch and strength, needs gait, balance, LE strength and conditioning, traction PRN    PT Home Exercise Plan  Access Code: [email or web address], added parial lunges, sit to stands, step ups fwd, lat, retro, also added LTR, SKTC, pelvic tilts, bridges, hip flexor stretch, HSS    Consulted and Agree with Plan of Care  Patient       Patient will benefit from skilled therapeutic intervention in order to improve the following deficits and impairments:  Abnormal gait, Decreased activity tolerance, Decreased balance, Decreased endurance, Decreased strength, Difficulty walking  Visit Diagnosis: Unsteadiness on feet  Muscle weakness (generalized)  Chronic bilateral low back pain without sciatica  Hemiplegia and hemiparesis following nontraumatic intracerebral hemorrhage affecting left non-dominant side (HCC)  Other symptoms and signs involving the nervous system     Problem List Patient Active Problem List   Diagnosis Date Noted  . Exudative age-related macular degeneration of left eye with active choroidal neovascularization ([place]) 10/11/2019  .  Intermediate stage nonexudative age-related macular degeneration of both eyes 10/11/2019  . Operculated retinal tear of left eye 10/11/2019  . Right posterior capsular opacification 10/11/2019  . Posterior vitreous detachment of left eye 10/11/2019  . Paving stone retinal degeneration of right eye 10/11/2019  . Calculus of kidney 03/12/2018  . Bleeding internal hemorrhoids 10/09/2017  . Sleep apnea 09/22/2017  . History of adenomatous polyp of colon 09/18/2017  . Prostate cancer ([place]) 09/18/2017  . Essential hypertension 08/26/2017  . Hyperlipidemia 08/26/2017  . Cerebral venous thrombosis 07/13/2017  . Intracerebral hemorrhage 07/10/2017  . ICH (intracerebral hemorrhage) ([place]) 07/10/2017  . Unstable angina ([place]) 03/12/2013    Debbe Odea, PT,DPT 10/15/2019, 9:13 AM  [facility] Physical Therapy [address] [place], [place], [address] Phone: [phone]   Fax:  [phone]  Name: Justin Adams MRN: [id number] Date of Birth: Jul 14, 1945

## 2019-10-19 ENCOUNTER — Ambulatory Visit (INDEPENDENT_AMBULATORY_CARE_PROVIDER_SITE_OTHER): Payer: Medicare Other | Admitting: Physical Therapy

## 2019-10-19 ENCOUNTER — Encounter: Payer: Self-pay | Admitting: Physical Therapy

## 2019-10-19 ENCOUNTER — Other Ambulatory Visit: Payer: Self-pay

## 2019-10-19 DIAGNOSIS — I69154 Hemiplegia and hemiparesis following nontraumatic intracerebral hemorrhage affecting left non-dominant side: Secondary | ICD-10-CM | POA: Diagnosis not present

## 2019-10-19 DIAGNOSIS — M6281 Muscle weakness (generalized): Secondary | ICD-10-CM

## 2019-10-19 DIAGNOSIS — M545 Low back pain, unspecified: Secondary | ICD-10-CM

## 2019-10-19 DIAGNOSIS — G8929 Other chronic pain: Secondary | ICD-10-CM | POA: Diagnosis not present

## 2019-10-19 DIAGNOSIS — R29818 Other symptoms and signs involving the nervous system: Secondary | ICD-10-CM | POA: Diagnosis not present

## 2019-10-19 DIAGNOSIS — R2681 Unsteadiness on feet: Secondary | ICD-10-CM

## 2019-10-19 NOTE — Therapy (Signed)
[facility] [place], [place], [address] Phone: [phone]   Fax:  [phone]  Physical Therapy Treatment  Patient Details  Name: Justin Adams MRN: [id number] Date of Birth: [birth year] Referring Provider (PT): [name], MD   Encounter Date: 10/19/2019  PT End of Session - 10/19/19 0845    Visit Number  44    Number of Visits  48    Date for PT Re-Evaluation  11/05/19    Authorization Type  MCR AARP    PT Start Time  0800    PT Stop Time  0855    PT Time Calculation (min)  55 min    Activity Tolerance  Patient tolerated treatment well    Behavior During Therapy  [facility] for tasks assessed/performed       Past Medical History:  Diagnosis Date  . Adenomatous polyp   . Bleeding internal hemorrhoids 10/2017  . Cerebral venous thrombosis   . Grade I diastolic dysfunction 97/41/6384   noted on ECHO   . Hemorrhoids   . History of kidney stones   . Hypercholesteremia   . Hypertension   . Intracranial hemorrhage (HCC)    Right temporal lobe  . LVH (left ventricular hypertrophy) 07/11/2017   Mild, noted on ECHO   . Muscular degeneration   . Nephrolithiasis   . Obesity   . OSA (obstructive sleep apnea)   . Polycythemia   . Prostate cancer ([place]) 2003  . Shingles   . Stroke ([place]) 06/2017   No residual  . Unsteady gait   . Ureteral calculi 02/12/2008   Left    Past Surgical History:  Procedure Laterality Date  . APPENDECTOMY     childhood  . CHOLECYSTECTOMY    . COLONOSCOPY    . HEMORRHOID BANDING  2015  . HOLMIUM LASER APPLICATION Left 53/11/4678   Procedure: HOLMIUM LASER APPLICATION;  Surgeon: Franchot Gallo, MD;  [facility];  Service: Urology;  Laterality: Left;  . IR ANGIO EXTERNAL CAROTID SEL EXT CAROTID UNI R MOD SED  07/10/2017  . IR ANGIO INTRA EXTRACRAN SEL INTERNAL CAROTID BILAT MOD SED  07/10/2017  . IR ANGIO VERTEBRAL SEL VERTEBRAL BILAT MOD SED  07/10/2017  . IR URETERAL STENT LEFT NEW  ACCESS W/O SEP NEPHROSTOMY CATH  03/12/2018  . LEFT HEART CATHETERIZATION WITH CORONARY ANGIOGRAM N/A 03/15/2013   Procedure: LEFT HEART CATHETERIZATION WITH CORONARY ANGIOGRAM;  Surgeon: Minus Breeding, MD;  Location: [facility] CATH LAB;  Service: Cardiovascular;  Laterality: N/A;  . NEPHROLITHOTOMY Left 03/12/2018   Procedure: LEFT NEPHROLITHOTOMY PERCUTANEOUS, STENT PLACEMENT;  Surgeon: Franchot Gallo, MD;  Location: [facility];  Service: Urology;  Laterality: Left;  . PROSTATECTOMY    . STONE EXTRACTION WITH BASKET      There were no vitals filed for this visit.  Subjective Assessment - 10/19/19 0826    Subjective  not that much pain today in my back    Pertinent History  PMH: HTN, CVA 06/2017, prostate Ca 2003,    Patient Stated Goals  improve gait and conditioning    Pain Onset  More than a month ago      [facility] Adult PT Treatment/Exercise - 10/19/19 0001      High Level Balance   High Level Balance Comments  SLS on airex 20 sec X 2 bilat, then one rep no airex      Lumbar Exercises: Stretches   Other Lumbar Stretch Exercise  standing L stretch 10 sec X 10  Lumbar Exercises: Aerobic   Recumbent Bike  8 min L2      Lumbar Exercises: Standing   Other Standing Lumbar Exercises  TRX rows and squats 2X15 ea    Other Standing Lumbar Exercises  deadlift blue band 2X10      Lumbar Exercises: Seated   Sit to Stand Limitations  from standard chair, no UE support 3 reps, then with airex pad underneath X5      Traction   Type of Traction  Lumbar    Min (lbs)  95    Max (lbs)  110    Hold Time  60    Rest Time  20    Time  15         PT Long Term Goals - 10/15/19 0912      PT LONG TERM GOAL #1   Title  s    Baseline  compliant with old HEP but progressed HEP today due to progress and he has not become consistent with going to gym as he just joined.    Time  6    Period  Weeks    Status  On-going      PT LONG TERM GOAL #2   Title  Pt will increaese overall leg strength to at  least 4+ to 5- overall to improve function    Baseline  Met 05/12/19    Time  6    Period  Weeks    Status  Achieved      PT LONG TERM GOAL #3   Title  Will improve BERG to at least 53 to show improved balance    Baseline  now 54 on 12/2    Time  6    Period  Weeks    Status  Achieved      PT LONG TERM GOAL #4   Title  Will improve FGA to at least 29 to lower falls risk    Baseline  improved to 28 on 08/31/19    Time  6    Period  Weeks    Status  On-going      PT LONG TERM GOAL #5   Title  Will improve 6 minute walk test to 1560 ft    Baseline  1544 on 3/23    Time  6    Period  Weeks    Status  Achieved            Plan - 10/19/19 0845    Clinical Impression Statement  He had good tolerance to strength and balance program today. Continues to progress with PT. Traction resumed to manage his chronic LBP. Continue POC    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    Rehab Potential  Good    PT Frequency  2x / week    PT Duration  6 weeks    PT Treatment/Interventions  Aquatic Therapy;Cryotherapy;Electrical Stimulation;Moist Heat;Gait training;Stair training;Functional mobility training;Therapeutic activities;Therapeutic exercise;Balance training;Neuromuscular re-education;Manual techniques;Passive range of motion;Taping;Joint Manipulations    PT Next Visit Plan  lumbar stretch and strength, needs gait, balance, LE strength and conditioning, traction PRN    PT Home Exercise Plan  Access Code: [email or web address], added parial lunges, sit to stands, step ups fwd, lat, retro, also added LTR, SKTC, pelvic tilts, bridges,  hip flexor stretch, HSS    Consulted and Agree with Plan of Care   Patient       Patient will benefit from skilled therapeutic intervention in order to improve the following deficits and impairments:  Abnormal gait, Decreased activity tolerance, Decreased balance, Decreased endurance, Decreased strength, Difficulty walking  Visit Diagnosis: Unsteadiness on feet  Muscle weakness (generalized)  Chronic bilateral low back pain without sciatica  Hemiplegia and hemiparesis following nontraumatic intracerebral hemorrhage affecting left non-dominant side (HCC)  Other symptoms and signs involving the nervous system     Problem List Patient Active Problem List   Diagnosis Date Noted  . Exudative age-related macular degeneration of left eye with active choroidal neovascularization (HCC) 10/11/2019  . Intermediate stage nonexudative age-related macular degeneration of both eyes 10/11/2019  . Operculated retinal tear of left eye 10/11/2019  . Right posterior capsular opacification 10/11/2019  . Posterior vitreous detachment of left eye 10/11/2019  . Paving stone retinal degeneration of right eye 10/11/2019  . Calculus of kidney 03/12/2018  . Bleeding internal hemorrhoids 10/09/2017  . Sleep apnea 09/22/2017  . History of adenomatous polyp of colon 09/18/2017  . Prostate cancer (HCC) 09/18/2017  . Essential hypertension 08/26/2017  . Hyperlipidemia 08/26/2017  . Cerebral venous thrombosis 07/13/2017  . Intracerebral hemorrhage 07/10/2017  . ICH (intracerebral hemorrhage) (HCC) 07/10/2017  . Unstable angina (HCC) 03/12/2013    Brian R Nelson,PT,DPT 10/19/2019, 10:29 AM  [facility] [address] [place], [place], [address] Phone: [phone]   Fax:  [phone]  Name: Amontae A Cartaya MRN: [id number] Date of Birth: 01/27/1946   

## 2019-10-21 ENCOUNTER — Ambulatory Visit (INDEPENDENT_AMBULATORY_CARE_PROVIDER_SITE_OTHER): Payer: Medicare Other | Admitting: Physical Therapy

## 2019-10-21 ENCOUNTER — Other Ambulatory Visit: Payer: Self-pay

## 2019-10-21 DIAGNOSIS — I69154 Hemiplegia and hemiparesis following nontraumatic intracerebral hemorrhage affecting left non-dominant side: Secondary | ICD-10-CM | POA: Diagnosis not present

## 2019-10-21 DIAGNOSIS — M6281 Muscle weakness (generalized): Secondary | ICD-10-CM

## 2019-10-21 DIAGNOSIS — G8929 Other chronic pain: Secondary | ICD-10-CM | POA: Diagnosis not present

## 2019-10-21 DIAGNOSIS — R2681 Unsteadiness on feet: Secondary | ICD-10-CM

## 2019-10-21 DIAGNOSIS — R29818 Other symptoms and signs involving the nervous system: Secondary | ICD-10-CM

## 2019-10-21 DIAGNOSIS — M545 Low back pain: Secondary | ICD-10-CM | POA: Diagnosis not present

## 2019-10-21 NOTE — Therapy (Signed)
[place], [place], [address] Phone: [phone]   Fax:  [phone]  Physical Therapy Treatment  Patient Details  Name: Justin Adams MRN: [id number] Date of Birth: 03/13/1946 Referring Provider (PT): [name], MD   Encounter Date: 10/21/2019  PT End of Session - 10/21/19 0904    Visit Number  45    Number of Visits  48    Date for PT Re-Evaluation  11/05/19    Authorization Type  MCR AARP    PT Start Time  0800    PT Stop Time  0900    PT Time Calculation (min)  60 min    Activity Tolerance  Patient tolerated treatment well    Behavior During Therapy  [facility] for tasks assessed/performed       Past Medical History:  Diagnosis Date  . Adenomatous polyp   . Bleeding internal hemorrhoids 10/2017  . Cerebral venous thrombosis   . Grade I diastolic dysfunction 40/03/2724   noted on ECHO   . Hemorrhoids   . History of kidney stones   . Hypercholesteremia   . Hypertension   . Intracranial hemorrhage (HCC)    Right temporal lobe  . LVH (left ventricular hypertrophy) 07/11/2017   Mild, noted on ECHO   . Muscular degeneration   . Nephrolithiasis   . Obesity   . OSA (obstructive sleep apnea)   . Polycythemia   . Prostate cancer ([place]) 2003  . Shingles   . Stroke ([place]) 06/2017   No residual  . Unsteady gait   . Ureteral calculi 02/12/2008   Left    Past Surgical History:  Procedure Laterality Date  . APPENDECTOMY     childhood  . CHOLECYSTECTOMY    . COLONOSCOPY    . HEMORRHOID BANDING  2015  . HOLMIUM LASER APPLICATION Left 36/11/4401   Procedure: HOLMIUM LASER APPLICATION;  Surgeon: Franchot Gallo, MD;  Location: [facility];  Service: Urology;  Laterality: Left;  . IR ANGIO EXTERNAL CAROTID SEL EXT CAROTID UNI R MOD SED  07/10/2017  . IR ANGIO INTRA EXTRACRAN SEL INTERNAL CAROTID BILAT MOD SED  07/10/2017  . IR ANGIO VERTEBRAL SEL VERTEBRAL BILAT MOD SED  07/10/2017  . IR URETERAL STENT LEFT NEW  ACCESS W/O SEP NEPHROSTOMY CATH  03/12/2018  . LEFT HEART CATHETERIZATION WITH CORONARY ANGIOGRAM N/A 03/15/2013   Procedure: LEFT HEART CATHETERIZATION WITH CORONARY ANGIOGRAM;  Surgeon: Minus Breeding, MD;  Location: [facility] CATH LAB;  Service: Cardiovascular;  Laterality: N/A;  . NEPHROLITHOTOMY Left 03/12/2018   Procedure: LEFT NEPHROLITHOTOMY PERCUTANEOUS, STENT PLACEMENT;  Surgeon: Franchot Gallo, MD;  Location: [facility];  Service: Urology;  Laterality: Left;  . PROSTATECTOMY    . STONE EXTRACTION WITH BASKET      There were no vitals filed for this visit.  Subjective Assessment - 10/21/19 0914    Subjective  I think the traction is really helping my back, not much pain overall today.    Pertinent History  PMH: HTN, CVA 06/2017, prostate Ca 2003,    Patient Stated Goals  improve gait and conditioning    Pain Onset  More than a month ago         [facility] PT Assessment - 10/21/19 0001      Assessment   Medical Diagnosis  Unsteady Gait, LBP    Referring Provider (PT)  Stoneking, Hal, MD      AROM   Lumbar Flexion  75%    Lumbar Extension  25%  Lumbar - Right Side Bend  75%    Lumbar - Left Side Bend  75%    Lumbar - Right Rotation  75%    Lumbar - Left Rotation  75%      Strength   Overall Strength Comments  knee strength overall 5/5, hip strength overall 5-/5 MMT grossly tested in sitting                    [facility] Adult PT Treatment/Exercise - 10/21/19 0001      High Level Balance   High Level Balance Comments  SLS, able to hold avg of 20 sec      Lumbar Exercises: Stretches   Other Lumbar Stretch Exercise  seated L stretch 10 sec X 10      Lumbar Exercises: Aerobic   Recumbent Bike  8 min L2      Lumbar Exercises: Machines for Strengthening   Leg Press  Shuttle leg press 112 lbs 3X15      Lumbar Exercises: Standing   Other Standing Lumbar Exercises  red sportcord for rows, extension, lat pull 2X15    Other Standing Lumbar Exercises  deadlift blue band  2X10      Lumbar Exercises: Seated   Sit to Stand Limitations  from standard chair, no UE support 3 reps, then with airex pad underneath X5    Other Seated Lumbar Exercises  hamstring curl X 20 reps bilat with L3 band      Traction   Type of Traction  Lumbar    Min (lbs)  95    Max (lbs)  110    Hold Time  60    Rest Time  20    Time  15                  PT Long Term Goals - 10/15/19 0912      PT LONG TERM GOAL #1   Title  s    Baseline  compliant with old HEP but progressed HEP today due to progress and he has not become consistent with going to gym as he just joined.    Time  6    Period  Weeks    Status  On-going      PT LONG TERM GOAL #2   Title  Pt will increaese overall leg strength to at least 4+ to 5- overall to improve function    Baseline  Met 05/12/19    Time  6    Period  Weeks    Status  Achieved      PT LONG TERM GOAL #3   Title  Will improve BERG to at least 53 to show improved balance    Baseline  now 54 on 12/2    Time  6    Period  Weeks    Status  Achieved      PT LONG TERM GOAL #4   Title  Will improve FGA to at least 29 to lower falls risk    Baseline  improved to 28 on 08/31/19    Time  6    Period  Weeks    Status  On-going      PT LONG TERM GOAL #5   Title  Will improve 6 minute walk test to 1560 ft    Baseline  1544 on 3/23    Time  6    Period  Weeks    Status  Achieved  Plan - 10/21/19 0904    Clinical Impression Statement  showed improvmeents in overall balance with SLS and now able to hold an avg of 20 seconds. Continued with leg strength, postural strength as tolerated and used mechanical traction which is helping keep overall chronic LBP mrore managed.    Personal Factors and Comorbidities  Comorbidity 1;Comorbidity 2    Comorbidities  PMH: HTN, CVA 06/2017, prostate Ca 2003,    Examination-Activity Limitations  Bend;Squat;Stairs;Stand;Lift;Locomotion Level    Examination-Participation Restrictions   Community Activity;Shop;Yard Work    [employer]  Evolving/Moderate complexity    Rehab Potential  Good    PT Frequency  2x / week    PT Duration  6 weeks    PT Treatment/Interventions  Aquatic Therapy;Cryotherapy;Electrical Stimulation;Moist Heat;Gait training;Stair training;Functional mobility training;Therapeutic activities;Therapeutic exercise;Balance training;Neuromuscular re-education;Manual techniques;Passive range of motion;Taping;Joint Manipulations    PT Next Visit Plan  lumbar stretch and strength, needs gait, balance, LE strength and conditioning, traction PRN    PT Home Exercise Plan  Access Code: [email or web address], added parial lunges, sit to stands, step ups fwd, lat, retro, also added LTR, SKTC, pelvic tilts, bridges, hip flexor stretch, HSS    Consulted and Agree with Plan of Care  Patient       Patient will benefit from skilled therapeutic intervention in order to improve the following deficits and impairments:  Abnormal gait, Decreased activity tolerance, Decreased balance, Decreased endurance, Decreased strength, Difficulty walking  Visit Diagnosis: Unsteadiness on feet  Muscle weakness (generalized)  Chronic bilateral low back pain without sciatica  Hemiplegia and hemiparesis following nontraumatic intracerebral hemorrhage affecting left non-dominant side (HCC)  Other symptoms and signs involving the nervous system     Problem List Patient Active Problem List   Diagnosis Date Noted  . Exudative age-related macular degeneration of left eye with active choroidal neovascularization ([place]) 10/11/2019  . Intermediate stage nonexudative age-related macular degeneration of both eyes 10/11/2019  . Operculated retinal tear of left eye 10/11/2019  . Right posterior capsular opacification 10/11/2019  . Posterior vitreous detachment of left eye 10/11/2019  . Paving stone retinal degeneration of right eye 10/11/2019  . Calculus of kidney 03/12/2018  .  Bleeding internal hemorrhoids 10/09/2017  . Sleep apnea 09/22/2017  . History of adenomatous polyp of colon 09/18/2017  . Prostate cancer ([place]) 09/18/2017  . Essential hypertension 08/26/2017  . Hyperlipidemia 08/26/2017  . Cerebral venous thrombosis 07/13/2017  . Intracerebral hemorrhage 07/10/2017  . ICH (intracerebral hemorrhage) ([place]) 07/10/2017  . Unstable angina ([place]) 03/12/2013    Debbe Odea, PT,DPT 10/21/2019, 9:16 AM  [facility] [address] [place], [place], [address] Phone: [phone]   Fax:  [phone]  Name: LESEAN WOOLVERTON MRN: [id number] Date of Birth: 1946/04/06

## 2019-11-02 ENCOUNTER — Encounter: Payer: Self-pay | Admitting: Physical Therapy

## 2019-11-02 ENCOUNTER — Other Ambulatory Visit: Payer: Self-pay

## 2019-11-02 ENCOUNTER — Ambulatory Visit (INDEPENDENT_AMBULATORY_CARE_PROVIDER_SITE_OTHER): Payer: Medicare Other | Admitting: Physical Therapy

## 2019-11-02 DIAGNOSIS — M545 Low back pain, unspecified: Secondary | ICD-10-CM

## 2019-11-02 DIAGNOSIS — M6281 Muscle weakness (generalized): Secondary | ICD-10-CM

## 2019-11-02 DIAGNOSIS — R2681 Unsteadiness on feet: Secondary | ICD-10-CM | POA: Diagnosis not present

## 2019-11-02 DIAGNOSIS — G8929 Other chronic pain: Secondary | ICD-10-CM

## 2019-11-02 DIAGNOSIS — I69154 Hemiplegia and hemiparesis following nontraumatic intracerebral hemorrhage affecting left non-dominant side: Secondary | ICD-10-CM | POA: Diagnosis not present

## 2019-11-02 DIAGNOSIS — R29818 Other symptoms and signs involving the nervous system: Secondary | ICD-10-CM

## 2019-11-02 NOTE — Therapy (Signed)
[place], [place], [address] Phone: [phone]   Fax:  [phone]  Physical Therapy Treatment  Patient Details  Name: Justin Adams MRN: [id number] Date of Birth: 13-Jun-1945 Referring Provider (PT): [name], MD   Encounter Date: 11/02/2019  PT End of Session - 11/02/19 0917    Visit Number  36    Number of Visits  36    Date for PT Re-Evaluation  11/05/19    Authorization Type  MCR AARP    PT Start Time  0800    PT Stop Time  0855    PT Time Calculation (min)  55 min    Activity Tolerance  Patient tolerated treatment well    Behavior During Therapy  [facility] for tasks assessed/performed       Past Medical History:  Diagnosis Date  . Adenomatous polyp   . Bleeding internal hemorrhoids 10/2017  . Cerebral venous thrombosis   . Grade I diastolic dysfunction 22/07/5425   noted on ECHO   . Hemorrhoids   . History of kidney stones   . Hypercholesteremia   . Hypertension   . Intracranial hemorrhage (HCC)    Right temporal lobe  . LVH (left ventricular hypertrophy) 07/11/2017   Mild, noted on ECHO   . Muscular degeneration   . Nephrolithiasis   . Obesity   . OSA (obstructive sleep apnea)   . Polycythemia   . Prostate cancer ([place]) 2003  . Shingles   . Stroke ([place]) 06/2017   No residual  . Unsteady gait   . Ureteral calculi 02/12/2008   Left    Past Surgical History:  Procedure Laterality Date  . APPENDECTOMY     childhood  . CHOLECYSTECTOMY    . COLONOSCOPY    . HEMORRHOID BANDING  2015  . HOLMIUM LASER APPLICATION Left 11/09/3760   Procedure: HOLMIUM LASER APPLICATION;  Surgeon: Franchot Gallo, MD;  Location: [facility];  Service: Urology;  Laterality: Left;  . IR ANGIO EXTERNAL CAROTID SEL EXT CAROTID UNI R MOD SED  07/10/2017  . IR ANGIO INTRA EXTRACRAN SEL INTERNAL CAROTID BILAT MOD SED  07/10/2017  . IR ANGIO VERTEBRAL SEL VERTEBRAL BILAT MOD SED  07/10/2017  . IR URETERAL STENT LEFT NEW  ACCESS W/O SEP NEPHROSTOMY CATH  03/12/2018  . LEFT HEART CATHETERIZATION WITH CORONARY ANGIOGRAM N/A 03/15/2013   Procedure: LEFT HEART CATHETERIZATION WITH CORONARY ANGIOGRAM;  Surgeon: Minus Breeding, MD;  Location: [facility] CATH LAB;  Service: Cardiovascular;  Laterality: N/A;  . NEPHROLITHOTOMY Left 03/12/2018   Procedure: LEFT NEPHROLITHOTOMY PERCUTANEOUS, STENT PLACEMENT;  Surgeon: Franchot Gallo, MD;  Location: [facility];  Service: Urology;  Laterality: Left;  . PROSTATECTOMY    . STONE EXTRACTION WITH BASKET      There were no vitals filed for this visit.  Subjective Assessment - 11/02/19 0915    Subjective  I am doing great, I have been going to gym on my own, I feel ready to finish up next visit and would like updated HEP.    Currently in Pain?  No/denies         [facility] Adult PT Treatment/Exercise - 11/02/19 0001      High Level Balance   High Level Balance Comments  SLS, able to hold avg of 20 sec      Lumbar Exercises: Stretches   Other Lumbar Stretch Exercise  seated L stretch 10 sec X 10      Lumbar Exercises: Aerobic   Recumbent Bike  10  min L2      Lumbar Exercises: Machines for Strengthening   Leg Press  Shuttle leg press 112 lbs 3X15      Lumbar Exercises: Standing   Other Standing Lumbar Exercises  red sportcord for rows, extension, lat pull 2X15    Other Standing Lumbar Exercises  deadlift blue band 2X10      Lumbar Exercises: Seated   Sit to Stand Limitations  from standard chair, no UE support 1 rep, then with airex pad underneath X10    Other Seated Lumbar Exercises  hamstring curl X 20 reps bilat with L3 band      Traction   Type of Traction  Lumbar    Min (lbs)  95    Max (lbs)  110    Hold Time  60    Rest Time  20    Time  15        PT Long Term Goals - 11/02/19 1191      PT LONG TERM GOAL #1   Title  Pt will report performing HEP, walking program and transition to gym at end of PT    Baseline  now consistent with gym, will go over final HEP  next visit    Time  6    Period  Weeks    Status  On-going      PT LONG TERM GOAL #2   Title  Pt will increaese overall leg strength to at least 4+ to 5- overall to improve function    Baseline  Met 05/12/19    Time  6    Period  Weeks    Status  Achieved      PT LONG TERM GOAL #3   Title  Will improve BERG to at least 53 to show improved balance    Baseline  now 54 on 12/2    Time  6    Period  Weeks    Status  Achieved      PT LONG TERM GOAL #4   Title  Will improve FGA to at least 29 to lower falls risk    Baseline  improved to 29 on 11/02/19    Time  6    Period  Weeks    Status  Achieved      PT LONG TERM GOAL #5   Title  Will improve 6 minute walk test to 1560 ft    Baseline  1544 on 3/23    Time  6    Period  Weeks    Status  Achieved            Plan - 11/02/19 4782    Clinical Impression Statement  He has joined gym and has been compliant going so PT feels he will be ready to discharge next visit as he has met goals and his low back pain is better managed. He will be given updated HEP progression next visit and PT will go over this with him to ensure understanding.    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    Rehab Potential  Good    PT Frequency  2x / week    PT Duration  6 weeks    PT Treatment/Interventions  Aquatic Therapy;Cryotherapy;Electrical Stimulation;Moist Heat;Gait training;Stair training;Functional mobility training;Therapeutic  activities;Therapeutic exercise;Balance training;Neuromuscular re-education;Manual techniques;Passive range of motion;Taping;Joint Manipulations    PT Next Visit Plan  review final HEP and DC    PT Home Exercise Plan   Access Code: [email or web address]    Consulted and Agree with Plan of Care  Patient       Patient will benefit from skilled therapeutic intervention in order to improve the following deficits and impairments:  Abnormal gait, Decreased activity tolerance, Decreased balance, Decreased endurance, Decreased strength, Difficulty walking  Visit Diagnosis: Unsteadiness on feet  Muscle weakness (generalized)  Chronic bilateral low back pain without sciatica  Hemiplegia and hemiparesis following nontraumatic intracerebral hemorrhage affecting left non-dominant side (HCC)  Other symptoms and signs involving the nervous system     Problem List Patient Active Problem List   Diagnosis Date Noted  . Exudative age-related macular degeneration of left eye with active choroidal neovascularization ([place]) 10/11/2019  . Intermediate stage nonexudative age-related macular degeneration of both eyes 10/11/2019  . Operculated retinal tear of left eye 10/11/2019  . Right posterior capsular opacification 10/11/2019  . Posterior vitreous detachment of left eye 10/11/2019  . Paving stone retinal degeneration of right eye 10/11/2019  . Calculus of kidney 03/12/2018  . Bleeding internal hemorrhoids 10/09/2017  . Sleep apnea 09/22/2017  . History of adenomatous polyp of colon 09/18/2017  . Prostate cancer ([place]) 09/18/2017  . Essential hypertension 08/26/2017  . Hyperlipidemia 08/26/2017  . Cerebral venous thrombosis 07/13/2017  . Intracerebral hemorrhage 07/10/2017  . ICH (intracerebral hemorrhage) ([place]) 07/10/2017  . Unstable angina [facility]) 03/12/2013    Silvestre Mesi 11/02/2019, 9:23 AM  [facility] [address] [place], [place], [address] Phone: [phone]   Fax:  [phone]  Name: GAROLD SHEELER MRN: [id number] Date of Birth: Oct 16, 1945

## 2019-11-02 NOTE — Patient Instructions (Signed)
Access Code: [email or web address] URL: [email or web address] Date: 11/02/2019 Prepared by: Elsie Ra  Exercises .Standing Lumbar Spine Flexion Stretch Counter - 1 x daily - 3 x weekly - 1 sets - 10 reps - 10 hold .Supine Bridge - 1 x daily - 3 x weekly - 1-2 sets - 10 reps - 5 hold .Sit to Stand with Arm Reach Toward Target - 1 x daily - 3 x weekly - 5 reps - 1-3 sets .Standing Row with Anchored Resistance - 1 x daily - 3 x weekly - 2-3 sets - 15-20 reps .Standing Shoulder Extension with Resistance - 1 x daily - 3 x weekly - 2-3 sets - 15-20 reps .Partial Lunge with Chair - 2 x daily - 6 x weekly - 10 reps - 1 sets .Standing Single Leg Stance with Counter Support - 1 x daily - 3 x weekly - 3 reps - 1 sets .Walking with Head Rotation - 1 x daily - 3 x weekly - 3 reps .Walking Tandem Stance - 1 x daily - 3 x weekly - 3 sets

## 2019-11-04 ENCOUNTER — Other Ambulatory Visit: Payer: Self-pay

## 2019-11-04 ENCOUNTER — Ambulatory Visit (INDEPENDENT_AMBULATORY_CARE_PROVIDER_SITE_OTHER): Payer: Medicare Other | Admitting: Physical Therapy

## 2019-11-04 DIAGNOSIS — M6281 Muscle weakness (generalized): Secondary | ICD-10-CM

## 2019-11-04 DIAGNOSIS — G8929 Other chronic pain: Secondary | ICD-10-CM | POA: Diagnosis not present

## 2019-11-04 DIAGNOSIS — R2681 Unsteadiness on feet: Secondary | ICD-10-CM

## 2019-11-04 DIAGNOSIS — M545 Low back pain, unspecified: Secondary | ICD-10-CM

## 2019-11-04 DIAGNOSIS — I69154 Hemiplegia and hemiparesis following nontraumatic intracerebral hemorrhage affecting left non-dominant side: Secondary | ICD-10-CM

## 2019-11-04 DIAGNOSIS — R29818 Other symptoms and signs involving the nervous system: Secondary | ICD-10-CM | POA: Diagnosis not present

## 2019-11-04 NOTE — Therapy (Signed)
[facility] [address] [place], [place], [address] Phone: [phone]   Fax:  [phone]  Physical Therapy Treatment/Discharge  PHYSICAL THERAPY DISCHARGE SUMMARY  Visits from Start of Care: 47  Current functional level related to goals / functional outcomes: See below   Remaining deficits: Mild posture and mild back pain   Education / Equipment: HEP Plan: Patient agrees to discharge.  Patient goals were partially met. Patient is being discharged due to meeting the stated rehab goals.  ?????       Patient Details  Name: Justin Adams MRN: [id number] Date of Birth: 1946-05-19 Referring Provider (PT): [name], MD   Encounter Date: 11/04/2019  PT End of Session - 11/04/19 0935    Visit Number  15    Number of Visits  48    Date for PT Re-Evaluation  11/05/19    Authorization Type  MCR A[employer]    PT Start Time  0800    PT Stop Time  0845    PT Time Calculation (min)  45 min    Activity Tolerance  Patient tolerated treatment well    Behavior During Therapy  [facility] for tasks assessed/performed       Past Medical History:  Diagnosis Date  . Adenomatous polyp   . Bleeding internal hemorrhoids 10/2017  . Cerebral venous thrombosis   . Grade I diastolic dysfunction 56/25/6389   noted on ECHO   . Hemorrhoids   . History of kidney stones   . Hypercholesteremia   . Hypertension   . Intracranial hemorrhage (HCC)    Right temporal lobe  . LVH (left ventricular hypertrophy) 07/11/2017   Mild, noted on ECHO   . Muscular degeneration   . Nephrolithiasis   . Obesity   . OSA (obstructive sleep apnea)   . Polycythemia   . Prostate cancer ([place]) 2003  . Shingles   . Stroke ([place]) 06/2017   No residual  . Unsteady gait   . Ureteral calculi 02/12/2008   Left    Past Surgical History:  Procedure Laterality Date  . APPENDECTOMY     childhood  . CHOLECYSTECTOMY    . COLONOSCOPY    . HEMORRHOID BANDING  2015  . HOLMIUM  LASER APPLICATION Left 37/08/4285   Procedure: HOLMIUM LASER APPLICATION;  Surgeon: Franchot Gallo, MD;  Location: [facility];  Service: Urology;  Laterality: Left;  . IR ANGIO EXTERNAL CAROTID SEL EXT CAROTID UNI R MOD SED  07/10/2017  . IR ANGIO INTRA EXTRACRAN SEL INTERNAL CAROTID BILAT MOD SED  07/10/2017  . IR ANGIO VERTEBRAL SEL VERTEBRAL BILAT MOD SED  07/10/2017  . IR URETERAL STENT LEFT NEW ACCESS W/O SEP NEPHROSTOMY CATH  03/12/2018  . LEFT HEART CATHETERIZATION WITH CORONARY ANGIOGRAM N/A 03/15/2013   Procedure: LEFT HEART CATHETERIZATION WITH CORONARY ANGIOGRAM;  Surgeon: Minus Breeding, MD;  Location: [facility] CATH LAB;  Service: Cardiovascular;  Laterality: N/A;  . NEPHROLITHOTOMY Left 03/12/2018   Procedure: LEFT NEPHROLITHOTOMY PERCUTANEOUS, STENT PLACEMENT;  Surgeon: Franchot Gallo, MD;  Location: [facility];  Service: Urology;  Laterality: Left;  . PROSTATECTOMY    . STONE EXTRACTION WITH BASKET      There were no vitals filed for this visit.  Subjective Assessment - 11/04/19 0918    Subjective  I am doing great, I have been so appreciative of every thing you have done with PT. No concerns or questions with discharge.    Pertinent History  PMH: HTN, CVA 06/2017, prostate Ca 2003,  Patient Stated Goals  improve gait and conditioning    Pain Onset  More than a month ago         [facility] PT Assessment - 11/04/19 0001      Assessment   Medical Diagnosis  Unsteady Gait, LBP,     Referring Provider (PT)  Stoneking, Hal, MD      AROM   Lumbar Flexion  75%    Lumbar Extension  25%    Lumbar - Right Side Bend  75%    Lumbar - Left Side Bend  75%    Lumbar - Right Rotation  75%    Lumbar - Left Rotation  75%      Strength   Overall Strength Comments  knee strength overall 5/5, hip strength overall 5-/5 MMT grossly tested in sitting      Functional Gait  Assessment   Gait Level Surface  Walks 20 ft in less than 5.5 sec, no assistive devices, good speed, no evidence for imbalance,  normal gait pattern, deviates no more than 6 in outside of the 12 in walkway width.    Change in Gait Speed  Able to smoothly change walking speed without loss of balance or gait deviation. Deviate no more than 6 in outside of the 12 in walkway width.    Gait with Horizontal Head Turns  Performs head turns smoothly with no change in gait. Deviates no more than 6 in outside 12 in walkway width    Gait with Vertical Head Turns  Performs head turns with no change in gait. Deviates no more than 6 in outside 12 in walkway width.    Gait and Pivot Turn  Pivot turns safely within 3 sec and stops quickly with no loss of balance.    Step Over Obstacle  Is able to step over 2 stacked shoe boxes taped together (9 in total height) without changing gait speed. No evidence of imbalance.    Gait with Narrow Base of Support  Ambulates 7-9 steps.    Gait with Eyes Closed  Walks 20 ft, no assistive devices, good speed, no evidence of imbalance, normal gait pattern, deviates no more than 6 in outside 12 in walkway width. Ambulates 20 ft in less than 7 sec.    Ambulating Backwards  Walks 20 ft, no assistive devices, good speed, no evidence for imbalance, normal gait    Steps  Alternating feet, no rail.    Total Score  29                    [facility] Adult PT Treatment/Exercise - 11/04/19 0001      High Level Balance   High Level Balance Comments  tandem walk, walking with head turns      Exercises   Other Exercises   lumbar stretching standing L stretch 10 sec X 10. lumbar strength: bridges 20 reps, standing rows and extensions with blue X 20 ea,  sit to stands 2X10 reps, parital lunges X 15 bilat,      Lumbar Exercises: Aerobic   Recumbent Bike  10 min L2             PT Education - 11/04/19 0935    Education Details  final HEP review    Person(s) Educated  Patient    Methods  Explanation;Demonstration;Verbal cues;Handout    Comprehension  Verbalized understanding;Returned demonstration           PT Long Term Goals - 11/04/19 3817  PT LONG TERM GOAL #1   Title  Pt will report performing HEP, walking program and transition to gym at end of PT    Baseline  now consistent with gym, will go over final HEP next visit    Time  6    Period  Weeks    Status  Achieved      PT LONG TERM GOAL #2   Title  Pt will increaese overall leg strength to at least 4+ to 5- overall to improve function    Baseline  Met 05/12/19    Time  6    Period  Weeks    Status  Achieved      PT LONG TERM GOAL #3   Title  Will improve BERG to at least 53 to show improved balance    Baseline  now 54 on 12/2    Time  6    Period  Weeks    Status  Achieved      PT LONG TERM GOAL #4   Title  Will improve FGA to at least 29 to lower falls risk    Baseline  improved to 29 on 11/02/19    Time  6    Period  Weeks    Status  Achieved      PT LONG TERM GOAL #5   Title  Will improve 6 minute walk test to 1560 ft    Baseline  1544 on 3/23    Time  6    Period  Weeks    Status  Achieved            Plan - 11/04/19 0936    Clinical Impression Statement  He has done great with PT, has met PT goals and is now compliant with exercise outside of PT. He will be discharged.    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    Rehab Potential  Good    PT Frequency  2x / week    PT Duration  6 weeks    PT Treatment/Interventions  Aquatic Therapy;Cryotherapy;Electrical Stimulation;Moist Heat;Gait training;Stair training;Functional mobility training;Therapeutic activities;Therapeutic exercise;Balance training;Neuromuscular re-education;Manual techniques;Passive range of motion;Taping;Joint Manipulations     PT Next Visit Plan  DC    PT Home Exercise Plan  Access Code: [email or web address]    Consulted and Agree with Plan of Care  Patient       Patient will benefit from skilled therapeutic intervention in order to improve the following deficits and impairments:  Abnormal gait, Decreased activity tolerance, Decreased balance, Decreased endurance, Decreased strength, Difficulty walking  Visit Diagnosis: Unsteadiness on feet  Muscle weakness (generalized)  Chronic bilateral low back pain without sciatica  Hemiplegia and hemiparesis following nontraumatic intracerebral hemorrhage affecting left non-dominant side (HCC)  Other symptoms and signs involving the nervous system     Problem List Patient Active Problem List   Diagnosis Date Noted  . Exudative age-related macular degeneration of left eye with active choroidal neovascularization ([place]) 10/11/2019  . Intermediate stage nonexudative age-related macular degeneration of both eyes 10/11/2019  . Operculated retinal tear of left eye 10/11/2019  . Right posterior capsular opacification 10/11/2019  . Posterior vitreous detachment of left eye 10/11/2019  . Paving stone retinal degeneration of right eye 10/11/2019  . Calculus of kidney 03/12/2018  .  Bleeding internal hemorrhoids 10/09/2017  . Sleep apnea 09/22/2017  . History of adenomatous polyp of colon 09/18/2017  . Prostate cancer ([place]) 09/18/2017  . Essential hypertension 08/26/2017  . Hyperlipidemia 08/26/2017  . Cerebral venous thrombosis 07/13/2017  . Intracerebral hemorrhage 07/10/2017  . ICH (intracerebral hemorrhage) ([place]) 07/10/2017  . Unstable angina [facility]) 03/12/2013    Silvestre Mesi 11/04/2019, 9:47 AM  [facility] Physical Therapy [address] [place], [place], [address] Phone: [phone]   Fax:  [phone]  Name: Justin Adams MRN: [id number] Date of Birth: [birth year]

## 2019-11-15 ENCOUNTER — Other Ambulatory Visit: Payer: Self-pay | Admitting: Neurology

## 2019-11-15 ENCOUNTER — Other Ambulatory Visit: Payer: Self-pay

## 2019-11-15 MED ORDER — APIXABAN 5 MG PO TABS: 5.0000 mg | ORAL_TABLET | Freq: Two times a day (BID) | ORAL | 0 refills | Status: DC

## 2019-11-15 MED ORDER — APIXABAN 5 MG PO TABS: 5.0000 mg | ORAL_TABLET | Freq: Two times a day (BID) | ORAL | 0 refills | Status: AC

## 2019-11-15 NOTE — Telephone Encounter (Signed)
90 day supply sent to pharmacy pt has an appt 12/11/19

## 2019-11-15 NOTE — Telephone Encounter (Signed)
Patient called and left a message requesting a new prescription for Eliquis 5 MG to be sent to [facility] in 3 month increments.

## 2019-11-15 NOTE — Telephone Encounter (Signed)
Patient is requesting a 3 month supply of Eliquis sent to [facility].

## 2019-11-15 NOTE — Telephone Encounter (Signed)
Script sent with 90 day supply pt appt 12/10/19

## 2019-11-30 ENCOUNTER — Ambulatory Visit (INDEPENDENT_AMBULATORY_CARE_PROVIDER_SITE_OTHER): Payer: Medicare Other | Admitting: Ophthalmology

## 2019-11-30 ENCOUNTER — Encounter (INDEPENDENT_AMBULATORY_CARE_PROVIDER_SITE_OTHER): Payer: Self-pay | Admitting: Ophthalmology

## 2019-11-30 ENCOUNTER — Other Ambulatory Visit: Payer: Self-pay

## 2019-11-30 DIAGNOSIS — H353221 Exudative age-related macular degeneration, left eye, with active choroidal neovascularization: Secondary | ICD-10-CM | POA: Diagnosis not present

## 2019-11-30 DIAGNOSIS — H353132 Nonexudative age-related macular degeneration, bilateral, intermediate dry stage: Secondary | ICD-10-CM | POA: Diagnosis not present

## 2019-11-30 MED ORDER — BEVACIZUMAB CHEMO INJECTION 1.25MG/0.05ML SYRINGE FOR KALEIDOSCOPE
1.2500 mg | INTRAVITREAL | Status: AC | PRN
  Administered 2019-11-30: 1.25 mg via INTRAVITREAL

## 2019-11-30 NOTE — Assessment & Plan Note (Signed)
The nature of wet macular degeneration was discussed with the patient.  Forms of therapy reviewed include the use of Anti-VEGF medications injected painlessly into the eye, as well as other possible treatment modalities, including thermal laser therapy. Fellow eye involvement and risks were discussed with the patient. Upon the finding of wet age related macular degeneration, treatment will be offered. The treatment regimen is on a treat as needed basis with the intent to treat if necessary and extend interval of exams when possible. On average 1 out of 6 patients do not need lifetime therapy. However, the risk of recurrent disease is high for a lifetime.  Initially monthly, then periodic, examinations and evaluations will determine whether the next treatment is required on the day of the examination.  For macular scar centrally yet with continued signs of active edges OS with subretinal hemorrhage clinically and thickening on the OCT The goal OS is to prevent lesion growth and minimize scotoma size OS

## 2019-11-30 NOTE — Progress Notes (Signed)
11/30/2019     CHIEF COMPLAINT Patient presents for Retina Follow Up   HISTORY OF PRESENT ILLNESS: Justin Adams is a 74 y.o. male who presents to the clinic today for:   HPI    Retina Follow Up    Patient presents with  Wet AMD.  In left eye.  Severity is moderate.  Duration of 7 weeks.  Since onset it is stable.  I, the attending physician,  performed the HPI with the patient and updated documentation appropriately.          Comments    7 Week AMD f\u OS. Possible Avastin OS. OCT  Pt states vision is stable. Denies any complaints.       Last edited by Tilda Franco on 11/30/2019  8:19 AM. (History)      Referring physician: [name], MD [address] [place],  [place] [address]  HISTORICAL INFORMATION:   Selected notes from the MEDICAL RECORD NUMBER    Lab Results  Component Value Date   HGBA1C 5.3 07/11/2017     CURRENT MEDICATIONS: No current outpatient medications on file. (Ophthalmic Drugs)   No current facility-administered medications for this visit. (Ophthalmic Drugs)   Current Outpatient Medications (Other)  Medication Sig  . acetaminophen (TYLENOL) 500 MG tablet Take 500-1,000 mg by mouth daily as needed for moderate pain or headache.  Marland Kitchen amLODipine (NORVASC) 2.5 MG tablet Take 1 tablet (2.5 mg total) by mouth daily as needed (if systolic bp is over 662).  Marland Kitchen apixaban (ELIQUIS) 5 MG TABS tablet Take 1 tablet (5 mg total) by mouth 2 (two) times daily.  Marland Kitchen atorvastatin (LIPITOR) 20 MG tablet Take 1 tablet (20 mg total) by mouth daily.  Marland Kitchen lisinopril (PRINIVIL,ZESTRIL) 20 MG tablet Take 1 tablet (20 mg total) by mouth daily.  . Melatonin 3 MG TABS Take 3 mg by mouth at bedtime as needed (sleep).  . polyethylene glycol (MIRALAX / GLYCOLAX) packet Take 17 g by mouth at bedtime.  . Wheat Dextrin (BENEFIBER) POWD Take 1 Dose by mouth daily.   No current facility-administered medications for this visit. (Other)      REVIEW OF  SYSTEMS:    ALLERGIES No Known Allergies  PAST MEDICAL HISTORY Past Medical History:  Diagnosis Date  . Adenomatous polyp   . Bleeding internal hemorrhoids 10/2017  . Cerebral venous thrombosis   . Grade I diastolic dysfunction 94/76/5465   noted on ECHO   . Hemorrhoids   . History of kidney stones   . Hypercholesteremia   . Hypertension   . Intracranial hemorrhage (HCC)    Right temporal lobe  . LVH (left ventricular hypertrophy) 07/11/2017   Mild, noted on ECHO   . Muscular degeneration   . Nephrolithiasis   . Obesity   . OSA (obstructive sleep apnea)   . Polycythemia   . Prostate cancer ([place]) 2003  . Shingles   . Stroke ([place]) 06/2017   No residual  . Unsteady gait   . Ureteral calculi 02/12/2008   Left   Past Surgical History:  Procedure Laterality Date  . APPENDECTOMY     childhood  . CHOLECYSTECTOMY    . COLONOSCOPY    . HEMORRHOID BANDING  2015  . HOLMIUM LASER APPLICATION Left 08/13/4654   Procedure: HOLMIUM LASER APPLICATION;  Surgeon: Franchot Gallo, MD;  Location: [facility];  Service: Urology;  Laterality: Left;  . IR ANGIO EXTERNAL CAROTID SEL EXT CAROTID UNI R MOD SED  07/10/2017  .  IR ANGIO INTRA EXTRACRAN SEL INTERNAL CAROTID BILAT MOD SED  07/10/2017  . IR ANGIO VERTEBRAL SEL VERTEBRAL BILAT MOD SED  07/10/2017  . IR URETERAL STENT LEFT NEW ACCESS W/O SEP NEPHROSTOMY CATH  03/12/2018  . LEFT HEART CATHETERIZATION WITH CORONARY ANGIOGRAM N/A 03/15/2013   Procedure: LEFT HEART CATHETERIZATION WITH CORONARY ANGIOGRAM;  Surgeon: Minus Breeding, MD;  Location: [facility] CATH LAB;  Service: Cardiovascular;  Laterality: N/A;  . NEPHROLITHOTOMY Left 03/12/2018   Procedure: LEFT NEPHROLITHOTOMY PERCUTANEOUS, STENT PLACEMENT;  Surgeon: Franchot Gallo, MD;  Location: [facility];  Service: Urology;  Laterality: Left;  . PROSTATECTOMY    . STONE EXTRACTION WITH BASKET      FAMILY HISTORY Family History  Problem Relation Age of Onset  . Hypertension Mother   .  Coronary artery disease Mother 43  . Breast cancer Mother   . Coronary artery disease Father 55       Died age 7  . CAD Brother 29    SOCIAL HISTORY Social History   Tobacco Use  . Smoking status: Never Smoker  . Smokeless tobacco: Never Used  Vaping Use  . Vaping Use: Never used  Substance Use Topics  . Alcohol use: No  . Drug use: No         OPHTHALMIC EXAM: Base Eye Exam    Visual Acuity (Snellen - Linear)      Right Left   Dist cc 20/25 E Card @ 4'   Dist ph cc  NI       Tonometry (Tonopen, 8:23 AM)      Right Left   Pressure 13 13       Pupils      Dark Light Shape React APD   Right 4 3 Round Brisk None   Left 4 3 Round Brisk None       Visual Fields (Counting fingers)      Left Right    Full Full       Neuro/Psych    Oriented x3: Yes   Mood/Affect: Normal       Dilation    Left eye: 1.0% Mydriacyl, 2.5% Phenylephrine @ 8:23 AM        Slit Lamp and Fundus Exam    External Exam      Right Left   External Normal Normal       Slit Lamp Exam      Right Left   Lids/Lashes Normal Normal   Conjunctiva/Sclera White and quiet White and quiet   Cornea Clear Clear   Anterior Chamber Deep and quiet Deep and quiet   Iris Round and reactive Round and reactive   Lens Posterior chamber intraocular lens Posterior chamber intraocular lens   Anterior Vitreous Normal Normal       Fundus Exam      Right Left   C/D Ratio  0.45   Macula  Macular thickening, Subretinal neovascular membrane, Retinal pigment epithelial mottling, Subretinal fibrosis, Hard drusen, Disciform scar, centrally with no subretinal hemorrhage peripheral scarring          IMAGING AND PROCEDURES  Imaging and Procedures for 11/30/19           ASSESSMENT/PLAN:  No problem-specific Assessment & Plan notes found for this encounter.      ICD-10-CM   1. Exudative age-related macular degeneration of left eye with active choroidal neovascularization (HCC)  H35.3221 OCT,  Retina - OU - Both Eyes  2. Intermediate stage nonexudative age-related macular degeneration of both eyes  H35.3132 OCT, Retina - OU - Both Eyes    1.  2.  3.  Ophthalmic Meds Ordered this visit:  No orders of the defined types were placed in this encounter.      No follow-ups on file.  There are no Patient Instructions on file for this visit.   Explained the diagnoses, plan, and follow up with the patient and they expressed understanding.  Patient expressed understanding of the importance of proper follow up care.   Clent Demark Leomia Blake M.D. Diseases & Surgery of the Retina and Vitreous [facility] [place] 11/30/19     Abbreviations: M myopia (nearsighted); A astigmatism; H hyperopia (farsighted); P presbyopia; Mrx spectacle prescription;  CTL contact lenses; OD right eye; OS left eye; OU both eyes  XT exotropia; ET esotropia; PEK punctate epithelial keratitis; PEE punctate epithelial erosions; DES dry eye syndrome; MGD meibomian gland dysfunction; ATs artificial tears; PFAT's preservative free artificial tears; JAARS nuclear sclerotic cataract; PSC posterior subcapsular cataract; ERM epi-retinal membrane; PVD posterior vitreous detachment; RD retinal detachment; DM diabetes mellitus; DR diabetic retinopathy; NPDR non-proliferative diabetic retinopathy; PDR proliferative diabetic retinopathy; CSME clinically significant macular edema; DME diabetic macular edema; dbh dot blot hemorrhages; CWS cotton wool spot; POAG primary open angle glaucoma; C/D cup-to-disc ratio; HVF humphrey visual field; GVF goldmann visual field; OCT optical coherence tomography; IOP intraocular pressure; BRVO Branch retinal vein occlusion; CRVO central retinal vein occlusion; CRAO central retinal artery occlusion; BRAO branch retinal artery occlusion; RT retinal tear; SB scleral buckle; PPV pars plana vitrectomy; VH Vitreous hemorrhage; PRP panretinal laser photocoagulation; IVK intravitreal kenalog; VMT  vitreomacular traction; MH Macular hole;  NVD neovascularization of the disc; NVE neovascularization elsewhere; AREDS age related eye disease study; ARMD age related macular degeneration; POAG primary open angle glaucoma; EBMD epithelial/anterior basement membrane dystrophy; ACIOL anterior chamber intraocular lens; IOL intraocular lens; PCIOL posterior chamber intraocular lens; Phaco/IOL phacoemulsification with intraocular lens placement; [place] photorefractive keratectomy; LASIK laser assisted in situ keratomileusis; HTN hypertension; DM diabetes mellitus; COPD chronic obstructive pulmonary disease

## 2019-12-09 NOTE — Progress Notes (Signed)
NEUROLOGY FOLLOW UP OFFICE NOTE  ULIS KAPS [id number]  HISTORY OF PRESENT ILLNESS: Justin Adams is a 21 year oldright-handed male withhypertension, hypercholesterolemia, and OSAand history of prostate cancerwho follows up for cerebral venous thrombosis.  UPDATE: Medications include: Eliquis, amlodipine 2.5mg , lisinopril 20mg , atorvastatin 20mg   He is doing well.  No headaches.  HISTORY: He was admitted to [facility] on 07/09/17 for right frontal headache and nausea. MRI and MRV of head showed right temporal intracerebral hemorrhage with thromboses of the right transversesinus, sigmoid sinus, and rightjugular vein, which was confirmed on diagnostic angiogram. MRA of the head showed no intracranial arterial abnormality such as aneurysm. CT of the chest abdomen and pelvis revealed no malignancy. 2D echocardiogram revealed ejection fraction of 55%. DRVVT borderline elevated at 49.7 but otherwise hypercoagulable panel was negative. LDL was 103 and hemoglobin A1c was 5.3. He has history of polycythemia with Hgb level 17.3-17.5 going back several years. Hgb on day of admission was 17.5. Subsequent Hgb levels have been around 16. He was started on IV heparin and was subsequently transitioned to Eliquis. He was also discharged on Lipitor 20 mg daily.  He later had a CTV on 09/05/17 which demonstrated interval resolution of the right temporal lobe intraparenchymal hemorrhage with residual encephalomalacia and interval improvement/parital recanalization of right-sided dural venous thrombosis with persistent nonocclusive thrombus within the right transverse and sigmoid sinuses and proximal right internal jugular vein.  PAST MEDICAL HISTORY: Past Medical History:  Diagnosis Date   Adenomatous polyp    Bleeding internal hemorrhoids 10/2017   Cerebral venous thrombosis    Grade I diastolic dysfunction 97/41/6384   noted on ECHO    Hemorrhoids    History  of kidney stones    Hypercholesteremia    Hypertension    Intracranial hemorrhage (HCC)    Right temporal lobe   LVH (left ventricular hypertrophy) 07/11/2017   Mild, noted on ECHO    Muscular degeneration    Nephrolithiasis    Obesity    OSA (obstructive sleep apnea)    Polycythemia    Prostate cancer ([place]) 2003   Shingles    Stroke ([place]) 06/2017   No residual   Unsteady gait    Ureteral calculi 02/12/2008   Left    MEDICATIONS: Current Outpatient Medications on File Prior to Visit  Medication Sig Dispense Refill   acetaminophen (TYLENOL) 500 MG tablet Take 500-1,000 mg by mouth daily as needed for moderate pain or headache.     amLODipine (NORVASC) 2.5 MG tablet Take 1 tablet (2.5 mg total) by mouth daily as needed (if systolic bp is over 536). 90 tablet 0   apixaban (ELIQUIS) 5 MG TABS tablet Take 1 tablet (5 mg total) by mouth 2 (two) times daily. 180 tablet 0   atorvastatin (LIPITOR) 20 MG tablet Take 1 tablet (20 mg total) by mouth daily. 90 tablet 0   lisinopril (PRINIVIL,ZESTRIL) 20 MG tablet Take 1 tablet (20 mg total) by mouth daily. 30 tablet 1   Melatonin 3 MG TABS Take 3 mg by mouth at bedtime as needed (sleep).     polyethylene glycol (MIRALAX / GLYCOLAX) packet Take 17 g by mouth at bedtime.     Wheat Dextrin (BENEFIBER) POWD Take 1 Dose by mouth daily.     No current facility-administered medications on file prior to visit.    ALLERGIES: No Known Allergies  FAMILY HISTORY: Family History  Problem Relation Age of Onset   Hypertension Mother    Coronary artery  disease Mother 66   Breast cancer Mother    Coronary artery disease Father 74       Died age 2   CAD Brother 49    SOCIAL HISTORY: Social History   Socioeconomic History   Marital status: Married    Spouse name: Marcie Bal   Number of children: 2   Years of education: Not on file   Highest education level: Bachelor's degree (e.g., BA, AB, BS)  Occupational  History   Occupation: retired  Tobacco Use   Smoking status: Never Smoker   Smokeless tobacco: Never Used  Scientific laboratory technician Use: Never used  Substance and Sexual Activity   Alcohol use: No   Drug use: No   Sexual activity: Not on file  Other Topics Concern   Not on file  Social History Narrative   Patient is right-handed. He lives with his wife in a 2 level home, though rarely goes to the 2nd floor. He avoids caffeine. He goes to the gym 5 x a week.   Social Determinants of Health   Financial Resource Strain:    Difficulty of Paying Living Expenses:   Food Insecurity:    Worried About Charity fundraiser in the Last Year:    Arboriculturist in the Last Year:   Transportation Needs:    Film/video editor (Medical):    Lack of Transportation (Non-Medical):   Physical Activity:    Days of Exercise per Week:    Minutes of Exercise per Session:   Stress:    Feeling of Stress :   Social Connections:    Frequency of Communication with Friends and Family:    Frequency of Social Gatherings with Friends and Family:    Attends Religious Services:    Active Member of Clubs or Organizations:    Attends Archivist Meetings:    Marital Status:   Intimate Partner Violence:    Fear of Current or Ex-Partner:    Emotionally Abused:    Physically Abused:    Sexually Abused:     PHYSICAL EXAM: Blood pressure 138/73, pulse 75, height 6' 2.5" (1.892 m), weight 254 lb 3.2 oz (115.3 kg), SpO2 96 %. General: No acute distress.  Patient appears well-groomed.   Head:  Normocephalic/atraumatic Eyes:  Fundi examined but not visualized Neck: supple, no paraspinal tenderness, full range of motion Heart:  Regular rate and rhythm Lungs:  Clear to auscultation bilaterally Back: No paraspinal tenderness Neurological Exam: alert and oriented to person, place, and time. Attention span and concentration intact, recent and remote memory intact, fund of  knowledge intact.  Speech fluent and not dysarthric, language intact.  CN II-XII intact. Bulk and tone normal, muscle strength 5/5 throughout.  Sensation to pinprick intact; vibratory sensation reduced in feet.  Deep tendon reflexes 1+ throughout, toes downgoing.  Finger to nose and heel to shin testing intact.  Gait normal, Romberg negative.  IMPRESSION: 1.  Cerebral venous thrombosis with right temporal intracerebral hemorrhage, unclear etiology 2.  Polycythemia 3.  Hypertension 4.  Hyperlipidemia  5.  OSA  PLAN: 1.  Long-term anticoagulation 2.  Statin therapy 3.  Blood pressure control 4.  CPAP 5.  Follow up as needed  [name], DO  CC:  [name], MD

## 2019-12-10 ENCOUNTER — Encounter: Payer: Self-pay | Admitting: Neurology

## 2019-12-10 ENCOUNTER — Other Ambulatory Visit: Payer: Self-pay

## 2019-12-10 ENCOUNTER — Ambulatory Visit (INDEPENDENT_AMBULATORY_CARE_PROVIDER_SITE_OTHER): Payer: Medicare Other | Admitting: Neurology

## 2019-12-10 VITALS — BP 138/73 | HR 75 | Ht 74.5 in | Wt 254.2 lb

## 2019-12-10 DIAGNOSIS — G4733 Obstructive sleep apnea (adult) (pediatric): Secondary | ICD-10-CM

## 2019-12-10 DIAGNOSIS — I10 Essential (primary) hypertension: Secondary | ICD-10-CM | POA: Diagnosis not present

## 2019-12-10 DIAGNOSIS — Z9989 Dependence on other enabling machines and devices: Secondary | ICD-10-CM

## 2019-12-10 DIAGNOSIS — E785 Hyperlipidemia, unspecified: Secondary | ICD-10-CM

## 2019-12-10 DIAGNOSIS — G08 Intracranial and intraspinal phlebitis and thrombophlebitis: Secondary | ICD-10-CM

## 2019-12-10 NOTE — Patient Instructions (Signed)
Continue current management.  Follow up as needed. 

## 2019-12-15 DIAGNOSIS — I10 Essential (primary) hypertension: Secondary | ICD-10-CM | POA: Diagnosis not present

## 2019-12-15 DIAGNOSIS — E7800 Pure hypercholesterolemia, unspecified: Secondary | ICD-10-CM | POA: Diagnosis not present

## 2019-12-20 ENCOUNTER — Encounter: Payer: Self-pay | Admitting: Podiatry

## 2019-12-20 ENCOUNTER — Ambulatory Visit (INDEPENDENT_AMBULATORY_CARE_PROVIDER_SITE_OTHER): Payer: Medicare Other | Admitting: Podiatry

## 2019-12-20 ENCOUNTER — Other Ambulatory Visit: Payer: Self-pay

## 2019-12-20 DIAGNOSIS — M79674 Pain in right toe(s): Secondary | ICD-10-CM | POA: Diagnosis not present

## 2019-12-20 DIAGNOSIS — B351 Tinea unguium: Secondary | ICD-10-CM

## 2019-12-20 DIAGNOSIS — M79675 Pain in left toe(s): Secondary | ICD-10-CM | POA: Diagnosis not present

## 2019-12-20 NOTE — Progress Notes (Signed)
   SUBJECTIVE Patient presents to office today complaining of elongated, thickened nails that cause pain while ambulating in shoes.  He is unable to trim his own nails. Patient is here for further evaluation and treatment.  Past Medical History:  Diagnosis Date  . Adenomatous polyp   . Bleeding internal hemorrhoids 10/2017  . Cerebral venous thrombosis   . Grade I diastolic dysfunction 07/11/2017   noted on ECHO   . Hemorrhoids   . History of kidney stones   . Hypercholesteremia   . Hypertension   . Intracranial hemorrhage (HCC)    Right temporal lobe  . LVH (left ventricular hypertrophy) 07/11/2017   Mild, noted on ECHO   . Muscular degeneration   . Nephrolithiasis   . Obesity   . OSA (obstructive sleep apnea)   . Polycythemia   . Prostate cancer (HCC) 2003  . Shingles   . Stroke (HCC) 06/2017   No residual  . Unsteady gait   . Ureteral calculi 02/12/2008   Left    OBJECTIVE General Patient is awake, alert, and oriented x 3 and in no acute distress. Derm Skin is dry and supple bilateral. Negative open lesions or macerations. Remaining integument unremarkable. Nails are tender, long, thickened and dystrophic with subungual debris, consistent with onychomycosis, 1-5 bilateral. No signs of infection noted. Vasc  DP and PT pedal pulses palpable bilaterally. Temperature gradient within normal limits.  Neuro Epicritic and protective threshold sensation grossly intact bilaterally.  Musculoskeletal Exam No symptomatic pedal deformities noted bilateral. Muscular strength within normal limits.  ASSESSMENT 1. Onychodystrophic nails 1-5 bilateral with hyperkeratosis of nails.  2. Onychomycosis of nail due to dermatophyte bilateral 3. Pain in foot bilateral  PLAN OF CARE 1. Patient evaluated today.  2. Instructed to maintain good pedal hygiene and foot care.  3. Mechanical debridement of nails 1-5 bilaterally performed using a nail nipper. Filed with dremel without incident.  4.  Return to clinic in 3 mos.    Jullie Arps M. Galia Rahm, DPM [facility]  Dr. Cleora Karnik M. Shemaiah Round, DPM    [address]                                        [place], [place] [address]                Office [phone]  Fax [phone]     

## 2019-12-29 ENCOUNTER — Telehealth: Payer: Self-pay | Admitting: Neurology

## 2019-12-29 NOTE — Telephone Encounter (Signed)
Left VM to inform pt that his atorvastatin and amlodipine refills need to be managed by his PCP.

## 2019-12-29 NOTE — Telephone Encounter (Signed)
Patient called in needing a 90 day refill of his amlodipine and atorvastatin sent to [facility].

## 2019-12-29 NOTE — Telephone Encounter (Signed)
Yes, this should be refilled by his PCP

## 2019-12-30 ENCOUNTER — Telehealth: Payer: Self-pay

## 2019-12-30 NOTE — Telephone Encounter (Signed)
Pt called questioning why atorvastatin and amlodipine refills not being sent in by Dr Tomi Likens and explained that these are not medications that are managed by neurology, he verbalized understanding and will contact his PCP.

## 2020-01-18 ENCOUNTER — Encounter (INDEPENDENT_AMBULATORY_CARE_PROVIDER_SITE_OTHER): Payer: Medicare Other | Admitting: Ophthalmology

## 2020-01-19 ENCOUNTER — Ambulatory Visit (INDEPENDENT_AMBULATORY_CARE_PROVIDER_SITE_OTHER): Payer: Medicare Other | Admitting: Ophthalmology

## 2020-01-19 ENCOUNTER — Encounter (INDEPENDENT_AMBULATORY_CARE_PROVIDER_SITE_OTHER): Payer: Self-pay | Admitting: Ophthalmology

## 2020-01-19 ENCOUNTER — Other Ambulatory Visit: Payer: Self-pay

## 2020-01-19 DIAGNOSIS — H353221 Exudative age-related macular degeneration, left eye, with active choroidal neovascularization: Secondary | ICD-10-CM | POA: Diagnosis not present

## 2020-01-19 DIAGNOSIS — H353132 Nonexudative age-related macular degeneration, bilateral, intermediate dry stage: Secondary | ICD-10-CM

## 2020-01-19 MED ORDER — BEVACIZUMAB CHEMO INJECTION 1.25MG/0.05ML SYRINGE FOR KALEIDOSCOPE
1.2500 mg | INTRAVITREAL | Status: AC | PRN
  Administered 2020-01-19: 1.25 mg via INTRAVITREAL

## 2020-01-19 NOTE — Assessment & Plan Note (Signed)
Chronic active CNVM subfoveal left eye with active edges, stabilized on q. 75-month examination and intravitreal Avastin. Pete injection OS today

## 2020-01-19 NOTE — Progress Notes (Signed)
01/19/2020     CHIEF COMPLAINT Patient presents for Retina Follow Up   HISTORY OF PRESENT ILLNESS: Justin Adams is a 74 y.o. male who presents to the clinic today for:   HPI    Retina Follow Up    Patient presents with  Wet AMD.  In left eye.  This started 7 weeks ago.  Severity is mild.  Duration of 7 weeks.  Since onset it is stable.          Comments    7 Week AMD F/U OS, poss Avastin OS  Pt denies noticeable changes to [place] OU since last visit. Pt denies ocular pain, flashes of light, or floaters OU.         Last edited by Rockie Neighbours, [place] on 01/19/2020  8:02 AM. (History)      Referring physician: [name], MD [address] [place],  [place] [address]  HISTORICAL INFORMATION:   Selected notes from the MEDICAL RECORD NUMBER    Lab Results  Component Value Date   HGBA1C 5.3 07/11/2017     CURRENT MEDICATIONS: No current outpatient medications on file. (Ophthalmic Drugs)   No current facility-administered medications for this visit. (Ophthalmic Drugs)   Current Outpatient Medications (Other)  Medication Sig  . acetaminophen (TYLENOL) 500 MG tablet Take 500-1,000 mg by mouth daily as needed for moderate pain or headache.  Marland Kitchen amLODipine (NORVASC) 2.5 MG tablet Take 1 tablet (2.5 mg total) by mouth daily as needed (if systolic bp is over 784).  Marland Kitchen apixaban (ELIQUIS) 5 MG TABS tablet Take 1 tablet (5 mg total) by mouth 2 (two) times daily.  Marland Kitchen atorvastatin (LIPITOR) 20 MG tablet Take 1 tablet (20 mg total) by mouth daily.  Marland Kitchen lisinopril (PRINIVIL,ZESTRIL) 20 MG tablet Take 1 tablet (20 mg total) by mouth daily.  . Melatonin 3 MG TABS Take 3 mg by mouth at bedtime as needed (sleep).  . polyethylene glycol (MIRALAX / GLYCOLAX) packet Take 17 g by mouth at bedtime.  . Wheat Dextrin (BENEFIBER) POWD Take 1 Dose by mouth daily. (Patient not taking: Reported on 12/10/2019)   No current facility-administered medications for this visit. (Other)       REVIEW OF SYSTEMS:    ALLERGIES No Known Allergies  PAST MEDICAL HISTORY Past Medical History:  Diagnosis Date  . Adenomatous polyp   . Bleeding internal hemorrhoids 10/2017  . Cerebral venous thrombosis   . Grade I diastolic dysfunction 69/62/9528   noted on ECHO   . Hemorrhoids   . History of kidney stones   . Hypercholesteremia   . Hypertension   . Intracranial hemorrhage (HCC)    Right temporal lobe  . LVH (left ventricular hypertrophy) 07/11/2017   Mild, noted on ECHO   . Muscular degeneration   . Nephrolithiasis   . Obesity   . OSA (obstructive sleep apnea)   . Polycythemia   . Prostate cancer ([place]) 2003  . Shingles   . Stroke ([place]) 06/2017   No residual  . Unsteady gait   . Ureteral calculi 02/12/2008   Left   Past Surgical History:  Procedure Laterality Date  . APPENDECTOMY     childhood  . CHOLECYSTECTOMY    . COLONOSCOPY    . HEMORRHOID BANDING  2015  . HOLMIUM LASER APPLICATION Left 41/08/2438   Procedure: HOLMIUM LASER APPLICATION;  Surgeon: Franchot Gallo, MD;  Location: [facility];  Service: Urology;  Laterality: Left;  . IR ANGIO EXTERNAL CAROTID SEL  EXT CAROTID UNI R MOD SED  07/10/2017  . IR ANGIO INTRA EXTRACRAN SEL INTERNAL CAROTID BILAT MOD SED  07/10/2017  . IR ANGIO VERTEBRAL SEL VERTEBRAL BILAT MOD SED  07/10/2017  . IR URETERAL STENT LEFT NEW ACCESS W/O SEP NEPHROSTOMY CATH  03/12/2018  . LEFT HEART CATHETERIZATION WITH CORONARY ANGIOGRAM N/A 03/15/2013   Procedure: LEFT HEART CATHETERIZATION WITH CORONARY ANGIOGRAM;  Surgeon: Minus Breeding, MD;  Location: [facility] CATH LAB;  Service: Cardiovascular;  Laterality: N/A;  . NEPHROLITHOTOMY Left 03/12/2018   Procedure: LEFT NEPHROLITHOTOMY PERCUTANEOUS, STENT PLACEMENT;  Surgeon: Franchot Gallo, MD;  Location: [facility];  Service: Urology;  Laterality: Left;  . PROSTATECTOMY    . STONE EXTRACTION WITH BASKET      FAMILY HISTORY Family History  Problem Relation Age of Onset  . Hypertension  Mother   . Coronary artery disease Mother 79  . Breast cancer Mother   . Coronary artery disease Father 5       Died age 30  . CAD Brother 4    SOCIAL HISTORY Social History   Tobacco Use  . Smoking status: Never Smoker  . Smokeless tobacco: Never Used  Vaping Use  . Vaping Use: Never used  Substance Use Topics  . Alcohol use: No  . Drug use: No         OPHTHALMIC EXAM:  Base Eye Exam    Visual Acuity (ETDRS)      Right Left   Dist cc 20/20 -2 E card @ 4'   Dist ph cc  NI   Correction: Glasses       Tonometry (Tonopen, 8:06 AM)      Right Left   Pressure 11 11       Pupils      Pupils Dark Light Shape React APD   Right PERRL 4 3 Round Brisk None   Left PERRL 4 3 Round Brisk None       Visual Fields (Counting fingers)      Left Right    Full Full       Extraocular Movement      Right Left    Full Full       Neuro/Psych    Oriented x3: Yes   Mood/Affect: Normal       Dilation    Left eye: 1.0% Mydriacyl, 2.5% Phenylephrine @ 8:06 AM        Slit Lamp and Fundus Exam    External Exam      Right Left   External Normal Normal       Slit Lamp Exam      Right Left   Lids/Lashes Normal Normal   Conjunctiva/Sclera White and quiet White and quiet   Cornea Clear Clear   Anterior Chamber Deep and quiet Deep and quiet   Iris Round and reactive Round and reactive   Lens Posterior chamber intraocular lens Posterior chamber intraocular lens   Anterior Vitreous Normal Normal       Fundus Exam      Right Left   Posterior Vitreous  Posterior vitreous detachment, Central vitreous floaters   Disc  Normal   C/D Ratio  0.45   Macula  Macular thickening, Subretinal neovascular membrane, Retinal pigment epithelial mottling, Subretinal fibrosis, Hard drusen, Disciform scar, centrally with subretinal hemorrhage peripheral scarring   Vessels  Normal   Periphery  Normal          IMAGING AND PROCEDURES  Imaging and Procedures for 01/19/20  OCT,  Retina - OU - Both Eyes       Right Eye Quality was good. Scan locations included subfoveal. Central Foveal Thickness: 291. Progression has been stable. Findings include no IRF, retinal drusen , normal observations.   Left Eye Quality was good. Scan locations included subfoveal. Central Foveal Thickness: 286. Progression has been stable. Findings include subretinal hyper-reflective material, intraretinal hyper-reflective material, disciform scar, intraretinal fluid.   Notes Subretinal scarring and subretinal fluid secondary to subretinal hemorrhage left eye       Intravitreal Injection, Pharmacologic Agent - OS - Left Eye       Time Out 01/19/2020. 8:44 AM. Confirmed correct patient, procedure, site, and patient consented.   Anesthesia Topical anesthesia was used. Anesthetic medications included Akten 3.5%.   Procedure Preparation included Tobramycin 0.3%, 10% betadine to eyelids, 5% betadine to ocular surface. A 30 gauge needle was used.   Injection:  1.25 mg Bevacizumab (AVASTIN) SOLN   NDC: [address]-0, Lot: [id number]   Route: Intravitreal, Site: Left Eye, Waste: 0 mg  Post-op Post injection exam found visual acuity of at least counting fingers. The patient tolerated the procedure well. There were no complications. The patient received written and verbal post procedure care education. Post injection medications were not given.                 ASSESSMENT/PLAN:  Exudative age-related macular degeneration of left eye with active choroidal neovascularization (HCC) Chronic active CNVM subfoveal left eye with active edges, stabilized on q. 28-month examination and intravitreal Avastin. Pete injection OS today      ICD-10-CM   1. Exudative age-related macular degeneration of left eye with active choroidal neovascularization (HCC)  H35.3221 OCT, Retina - OU - Both Eyes    Intravitreal Injection, Pharmacologic Agent - OS - Left Eye    Bevacizumab (AVASTIN) SOLN 1.25 mg  2.  Intermediate stage nonexudative age-related macular degeneration of both eyes  H35.3132     1. Repeat injection intravitreal Avastin OS today.  2.  3.  Ophthalmic Meds Ordered this visit:  Meds ordered this encounter  Medications  . Bevacizumab (AVASTIN) SOLN 1.25 mg       Return in about 7 weeks (around 03/08/2020) for DILATE OU, AVASTIN OCT, OS.  Patient Instructions  Patient to notify the office promptly should new onset visual decline or distortion develop in either eye    Explained the diagnoses, plan, and follow up with the patient and they expressed understanding.  Patient expressed understanding of the importance of proper follow up care.   Clent Demark Tajha Sammarco M.D. Diseases & Surgery of the Retina and Vitreous [facility] 01/19/20     Abbreviations: M myopia (nearsighted); A astigmatism; H hyperopia (farsighted); P presbyopia; Mrx spectacle prescription;  CTL contact lenses; OD right eye; OS left eye; OU both eyes  XT exotropia; ET esotropia; PEK punctate epithelial keratitis; PEE punctate epithelial erosions; DES dry eye syndrome; MGD meibomian gland dysfunction; ATs artificial tears; PFAT's preservative free artificial tears; [place] nuclear sclerotic cataract; PSC posterior subcapsular cataract; ERM epi-retinal membrane; PVD posterior vitreous detachment; RD retinal detachment; DM diabetes mellitus; DR diabetic retinopathy; NPDR non-proliferative diabetic retinopathy; PDR proliferative diabetic retinopathy; CSME clinically significant macular edema; DME diabetic macular edema; dbh dot blot hemorrhages; CWS cotton wool spot; POAG primary open angle glaucoma; C/D cup-to-disc ratio; HVF humphrey visual field; GVF goldmann visual field; OCT optical coherence tomography; IOP intraocular pressure; BRVO Branch retinal vein occlusion; CRVO central retinal vein occlusion; CRAO central retinal artery  occlusion; BRAO branch retinal artery occlusion; RT retinal tear; SB  scleral buckle; PPV pars plana vitrectomy; VH Vitreous hemorrhage; PRP panretinal laser photocoagulation; IVK intravitreal kenalog; VMT vitreomacular traction; MH Macular hole;  NVD neovascularization of the disc; NVE neovascularization elsewhere; AREDS age related eye disease study; ARMD age related macular degeneration; POAG primary open angle glaucoma; EBMD epithelial/anterior basement membrane dystrophy; ACIOL anterior chamber intraocular lens; IOL intraocular lens; PCIOL posterior chamber intraocular lens; Phaco/IOL phacoemulsification with intraocular lens placement; [place] photorefractive keratectomy; LASIK laser assisted in situ keratomileusis; HTN hypertension; DM diabetes mellitus; COPD chronic obstructive pulmonary disease

## 2020-01-19 NOTE — Patient Instructions (Signed)
Patient to notify the office promptly should new onset visual decline or distortion develop in either eye

## 2020-02-07 DIAGNOSIS — I10 Essential (primary) hypertension: Secondary | ICD-10-CM | POA: Diagnosis not present

## 2020-02-07 DIAGNOSIS — E7800 Pure hypercholesterolemia, unspecified: Secondary | ICD-10-CM | POA: Diagnosis not present

## 2020-02-11 DIAGNOSIS — E7800 Pure hypercholesterolemia, unspecified: Secondary | ICD-10-CM | POA: Diagnosis not present

## 2020-02-11 DIAGNOSIS — Z0000 Encounter for general adult medical examination without abnormal findings: Secondary | ICD-10-CM | POA: Diagnosis not present

## 2020-02-11 DIAGNOSIS — Z79899 Other long term (current) drug therapy: Secondary | ICD-10-CM | POA: Diagnosis not present

## 2020-02-11 DIAGNOSIS — I10 Essential (primary) hypertension: Secondary | ICD-10-CM | POA: Diagnosis not present

## 2020-02-11 DIAGNOSIS — Z1389 Encounter for screening for other disorder: Secondary | ICD-10-CM | POA: Diagnosis not present

## 2020-02-11 DIAGNOSIS — R269 Unspecified abnormalities of gait and mobility: Secondary | ICD-10-CM | POA: Diagnosis not present

## 2020-02-11 DIAGNOSIS — G4733 Obstructive sleep apnea (adult) (pediatric): Secondary | ICD-10-CM | POA: Diagnosis not present

## 2020-02-29 ENCOUNTER — Other Ambulatory Visit: Payer: Self-pay

## 2020-02-29 ENCOUNTER — Encounter (HOSPITAL_COMMUNITY): Payer: Self-pay

## 2020-02-29 ENCOUNTER — Ambulatory Visit (HOSPITAL_COMMUNITY)
Admission: EM | Admit: 2020-02-29 | Discharge: 2020-02-29 | Disposition: A | Discharge status: Home / Self Care | Payer: Medicare Other

## 2020-02-29 ENCOUNTER — Emergency Department (HOSPITAL_COMMUNITY): Payer: Medicare Other

## 2020-02-29 ENCOUNTER — Emergency Department (HOSPITAL_COMMUNITY)
Admission: EM | Admit: 2020-02-29 | Discharge: 2020-03-01 | Disposition: A | Discharge status: Home / Self Care | Payer: Medicare Other | Attending: Emergency Medicine | Admitting: Emergency Medicine

## 2020-02-29 DIAGNOSIS — Z8546 Personal history of malignant neoplasm of prostate: Secondary | ICD-10-CM | POA: Insufficient documentation

## 2020-02-29 DIAGNOSIS — N3289 Other specified disorders of bladder: Secondary | ICD-10-CM | POA: Diagnosis not present

## 2020-02-29 DIAGNOSIS — R110 Nausea: Secondary | ICD-10-CM | POA: Diagnosis not present

## 2020-02-29 DIAGNOSIS — R1084 Generalized abdominal pain: Secondary | ICD-10-CM | POA: Insufficient documentation

## 2020-02-29 DIAGNOSIS — I10 Essential (primary) hypertension: Secondary | ICD-10-CM | POA: Insufficient documentation

## 2020-02-29 DIAGNOSIS — Z79899 Other long term (current) drug therapy: Secondary | ICD-10-CM | POA: Diagnosis not present

## 2020-02-29 DIAGNOSIS — K5900 Constipation, unspecified: Secondary | ICD-10-CM | POA: Insufficient documentation

## 2020-02-29 DIAGNOSIS — Z20822 Contact with and (suspected) exposure to covid-19: Secondary | ICD-10-CM | POA: Insufficient documentation

## 2020-02-29 DIAGNOSIS — R6883 Chills (without fever): Secondary | ICD-10-CM | POA: Insufficient documentation

## 2020-02-29 DIAGNOSIS — N200 Calculus of kidney: Secondary | ICD-10-CM | POA: Diagnosis not present

## 2020-02-29 LAB — URINALYSIS, ROUTINE W REFLEX MICROSCOPIC
Bilirubin Urine: NEGATIVE
Glucose, UA: NEGATIVE mg/dL
Hgb urine dipstick: NEGATIVE
Ketones, ur: 20 mg/dL — AB
Nitrite: NEGATIVE
Protein, ur: NEGATIVE mg/dL
Specific Gravity, Urine: 1.019 (ref 1.005–1.030)
pH: 5 (ref 5.0–8.0)

## 2020-02-29 LAB — COMPREHENSIVE METABOLIC PANEL
ALT: 35 U/L (ref 0–44)
AST: 52 U/L — ABNORMAL HIGH (ref 15–41)
Albumin: 3.7 g/dL (ref 3.5–5.0)
Alkaline Phosphatase: 91 U/L (ref 38–126)
Anion gap: 10 (ref 5–15)
BUN: 14 mg/dL (ref 8–23)
CO2: 25 mmol/L (ref 22–32)
Calcium: 8.8 mg/dL — ABNORMAL LOW (ref 8.9–10.3)
Chloride: 106 mmol/L (ref 98–111)
Creatinine, Ser: 0.93 mg/dL (ref 0.61–1.24)
GFR calc Af Amer: >60 mL/min (ref >60)
GFR calc non Af Amer: >60 mL/min (ref >60)
Glucose, Bld: 130 mg/dL — ABNORMAL HIGH (ref 70–99)
Potassium: 3.6 mmol/L (ref 3.5–5.1)
Sodium: 141 mmol/L (ref 135–145)
Total Bilirubin: 1.8 mg/dL — ABNORMAL HIGH (ref 0.3–1.2)
Total Protein: 6.4 g/dL — ABNORMAL LOW (ref 6.5–8.1)

## 2020-02-29 LAB — CBC
HCT: 49.5 % (ref 39.0–52.0)
Hemoglobin: 15.8 g/dL (ref 13.0–17.0)
MCH: 28.8 pg (ref 26.0–34.0)
MCHC: 31.9 g/dL (ref 30.0–36.0)
MCV: 90.2 fL (ref 80.0–100.0)
Platelets: 179 10*3/uL (ref 150–400)
RBC: 5.49 MIL/uL (ref 4.22–5.81)
RDW: 13.9 % (ref 11.5–15.5)
WBC: 9.7 10*3/uL (ref 4.0–10.5)
nRBC: 0 % (ref 0.0–0.2)

## 2020-02-29 LAB — SARS CORONAVIRUS 2 BY RT PCR (HOSPITAL ORDER, PERFORMED IN ~~LOC~~ HOSPITAL LAB): SARS Coronavirus 2: NEGATIVE

## 2020-02-29 LAB — LIPASE, BLOOD: Lipase: 78 U/L — ABNORMAL HIGH (ref 11–51)

## 2020-02-29 LAB — LACTIC ACID, PLASMA: Lactic Acid, Venous: 1.6 mmol/L (ref 0.5–1.9)

## 2020-02-29 MED ORDER — IOHEXOL 9 MG/ML PO SOLN
ORAL | Status: AC
  Filled 2020-02-29: qty 1000

## 2020-02-29 MED ORDER — IOHEXOL 300 MG/ML  SOLN
125.0000 mL | Freq: Once | INTRAMUSCULAR | Status: AC | PRN
  Administered 2020-02-29: 125 mL via INTRAVENOUS

## 2020-02-29 NOTE — ED Triage Notes (Signed)
Pt presents to ED from home with complaints of upper abdominal pain that began after taking tylenol 3. Endorses nausea, denies vomiting. Sent from [facility].

## 2020-02-29 NOTE — ED Notes (Signed)
Pt to CT scan via stretcher by transport. 

## 2020-02-29 NOTE — ED Triage Notes (Signed)
Patient presents to [facility] for assessment of upper abdominal pain starting after he took a Tylenol 3 for dental pain (patient had tooth pulled this morning).  Patient c/o nausea, denies actual emesis.

## 2020-02-29 NOTE — ED Provider Notes (Signed)
[facility] EMERGENCY DEPARTMENT Provider Note   CSN: [id number] Arrival date & time: 02/29/20  1658     History Chief Complaint  Patient presents with  . Abdominal Pain    Justin Adams is a 74 y.o. male.  The history is provided by the patient, medical records and the spouse. No language interpreter was used.  Abdominal Pain Pain location:  Generalized Pain quality: aching, cramping, gnawing and heavy   Pain radiates to:  Does not radiate Pain severity:  Severe Onset quality:  Sudden Duration:  6 hours Timing:  Intermittent Progression:  Waxing and waning Chronicity:  New Relieved by:  Nothing Worsened by:  Nothing Ineffective treatments:  None tried Associated symptoms: chills, constipation and nausea   Associated symptoms: no chest pain, no cough, no diarrhea, no dysuria, no fatigue, no fever, no flatus, no hematemesis, no hematochezia, no hematuria, no shortness of breath and no vomiting   Risk factors: multiple surgeries        Past Medical History:  Diagnosis Date  . Adenomatous polyp   . Bleeding internal hemorrhoids 10/2017  . Cerebral venous thrombosis   . Grade I diastolic dysfunction 89/38/1017   noted on ECHO   . Hemorrhoids   . History of kidney stones   . Hypercholesteremia   . Hypertension   . Intracranial hemorrhage (HCC)    Right temporal lobe  . LVH (left ventricular hypertrophy) 07/11/2017   Mild, noted on ECHO   . Muscular degeneration   . Nephrolithiasis   . Obesity   . OSA (obstructive sleep apnea)   . Polycythemia   . Prostate cancer ([place]) 2003  . Shingles   . Stroke ([place]) 06/2017   No residual  . Unsteady gait   . Ureteral calculi 02/12/2008   Left    Patient Active Problem List   Diagnosis Date Noted  . Exudative age-related macular degeneration of left eye with active choroidal neovascularization ([place]) 10/11/2019  . Intermediate stage nonexudative age-related macular degeneration of both eyes  10/11/2019  . Operculated retinal tear of left eye 10/11/2019  . Right posterior capsular opacification 10/11/2019  . Posterior vitreous detachment of left eye 10/11/2019  . Paving stone retinal degeneration of right eye 10/11/2019  . Calculus of kidney 03/12/2018  . Bleeding internal hemorrhoids 10/09/2017  . Sleep apnea 09/22/2017  . History of adenomatous polyp of colon 09/18/2017  . Prostate cancer ([place]) 09/18/2017  . Essential hypertension 08/26/2017  . Hyperlipidemia 08/26/2017  . Cerebral venous thrombosis 07/13/2017  . Intracerebral hemorrhage 07/10/2017  . ICH (intracerebral hemorrhage) ([place]) 07/10/2017  . Unstable angina ([place]) 03/12/2013    Past Surgical History:  Procedure Laterality Date  . APPENDECTOMY     childhood  . CHOLECYSTECTOMY    . COLONOSCOPY    . HEMORRHOID BANDING  2015  . HOLMIUM LASER APPLICATION Left 51/0/2585   Procedure: HOLMIUM LASER APPLICATION;  Surgeon: Franchot Gallo, MD;  Location: [facility];  Service: Urology;  Laterality: Left;  . IR ANGIO EXTERNAL CAROTID SEL EXT CAROTID UNI R MOD SED  07/10/2017  . IR ANGIO INTRA EXTRACRAN SEL INTERNAL CAROTID BILAT MOD SED  07/10/2017  . IR ANGIO VERTEBRAL SEL VERTEBRAL BILAT MOD SED  07/10/2017  . IR URETERAL STENT LEFT NEW ACCESS W/O SEP NEPHROSTOMY CATH  03/12/2018  . LEFT HEART CATHETERIZATION WITH CORONARY ANGIOGRAM N/A 03/15/2013   Procedure: LEFT HEART CATHETERIZATION WITH CORONARY ANGIOGRAM;  Surgeon: Minus Breeding, MD;  Location: [facility] CATH LAB;  Service: Cardiovascular;  Laterality:  N/A;  . NEPHROLITHOTOMY Left 03/12/2018   Procedure: LEFT NEPHROLITHOTOMY PERCUTANEOUS, STENT PLACEMENT;  Surgeon: Franchot Gallo, MD;  Location: [facility];  Service: Urology;  Laterality: Left;  . PROSTATECTOMY    . STONE EXTRACTION WITH BASKET         Family History  Problem Relation Age of Onset  . Hypertension Mother   . Coronary artery disease Mother 62  . Breast cancer Mother   . Coronary artery disease Father  10       Died age 63  . CAD Brother 69    Social History   Tobacco Use  . Smoking status: Never Smoker  . Smokeless tobacco: Never Used  Vaping Use  . Vaping Use: Never used  Substance Use Topics  . Alcohol use: No  . Drug use: No    Home Medications Prior to Admission medications   Medication Sig Start Date End Date Taking? Authorizing Provider  acetaminophen (TYLENOL) 500 MG tablet Take 500-1,000 mg by mouth daily as needed for moderate pain or headache.    [provider]  amLODipine (NORVASC) 2.5 MG tablet Take 1 tablet (2.5 mg total) by mouth daily as needed (if systolic bp is over 466). 09/23/19   Pieter Partridge, DO  apixaban (ELIQUIS) 5 MG TABS tablet Take 1 tablet (5 mg total) by mouth 2 (two) times daily. 11/15/19   Tomi Likens, Adam R, DO  atorvastatin (LIPITOR) 20 MG tablet Take 1 tablet (20 mg total) by mouth daily. 09/23/19   Pieter Partridge, DO  lisinopril (PRINIVIL,ZESTRIL) 20 MG tablet Take 1 tablet (20 mg total) by mouth daily. 07/14/17   Mary Sella, NP  Melatonin 3 MG TABS Take 3 mg by mouth at bedtime as needed (sleep).    [provider]  polyethylene glycol (MIRALAX / GLYCOLAX) packet Take 17 g by mouth at bedtime.    [provider]  Wheat Dextrin (BENEFIBER) POWD Take 1 Dose by mouth daily. Patient not taking: Reported on 12/10/2019    [provider]    Allergies    Patient has no known allergies.  Review of Systems   Review of Systems  Constitutional: Positive for chills and diaphoresis. Negative for fatigue and fever.  HENT: Negative for congestion.   Respiratory: Negative for cough, chest tightness, shortness of breath, wheezing and stridor.   Cardiovascular: Negative for chest pain, palpitations and leg swelling.  Gastrointestinal: Positive for abdominal pain, constipation and nausea. Negative for abdominal distention, diarrhea, flatus, hematemesis, hematochezia and vomiting.  Genitourinary: Negative for dysuria, flank  pain, frequency and hematuria.  Musculoskeletal: Negative for back pain, neck pain and neck stiffness.  Skin: Negative for rash and wound.  Neurological: Positive for light-headedness. Negative for dizziness, seizures, speech difficulty, weakness, numbness and headaches.  Psychiatric/Behavioral: Negative for agitation and confusion.  All other systems reviewed and are negative.   Physical Exam Updated Vital Signs BP (!) 145/73 (BP Location: Right Arm)   Pulse 81   Temp 97.6 F (36.4 C) (Axillary)   Resp 20   Ht 6\' 2"  (1.88 m)   Wt 103.4 kg   SpO2 99%   BMI 29.27 kg/m   Physical Exam Vitals and nursing note reviewed.  Constitutional:      General: He is not in acute distress.    Appearance: He is well-developed. He is not ill-appearing, toxic-appearing or diaphoretic.  HENT:     Head: Normocephalic and atraumatic.     Mouth/Throat:     Mouth: Mucous membranes  are moist.  Eyes:     Conjunctiva/sclera: Conjunctivae normal.  Cardiovascular:     Rate and Rhythm: Normal rate and regular rhythm.     Heart sounds: No murmur heard.   Pulmonary:     Effort: Pulmonary effort is normal. No respiratory distress.     Breath sounds: Normal breath sounds.  Abdominal:     General: Abdomen is flat. Bowel sounds are decreased. There is no distension.     Palpations: Abdomen is soft.     Tenderness: There is no abdominal tenderness. There is no right CVA tenderness, left CVA tenderness, guarding or rebound.  Musculoskeletal:     Cervical back: Neck supple.  Skin:    General: Skin is warm and dry.  Neurological:     Mental Status: He is alert.     ED Results / Procedures / Treatments   Labs (all labs ordered are listed, but only abnormal results are displayed) Labs Reviewed  LIPASE, BLOOD - Abnormal; Notable for the following components:      Result Value   Lipase 78 (*)    All other components within normal limits  COMPREHENSIVE METABOLIC PANEL - Abnormal; Notable for the  following components:   Glucose, Bld 130 (*)    Calcium 8.8 (*)    Total Protein 6.4 (*)    AST 52 (*)    Total Bilirubin 1.8 (*)    All other components within normal limits  URINALYSIS, ROUTINE W REFLEX MICROSCOPIC - Abnormal; Notable for the following components:   Color, Urine AMBER (*)    APPearance HAZY (*)    Ketones, ur 20 (*)    Leukocytes,Ua TRACE (*)    Bacteria, UA RARE (*)    All other components within normal limits  SARS CORONAVIRUS 2 BY RT PCR (HOSPITAL ORDER, [facility])  URINE CULTURE  CBC  LACTIC ACID, PLASMA  LACTIC ACID, PLASMA    EKG None  Radiology CT ABDOMEN PELVIS W CONTRAST  Result Date: 02/29/2020 CLINICAL DATA:  Laxity and waning abdominal pain, hypotension EXAM: CT ABDOMEN AND PELVIS WITH CONTRAST TECHNIQUE: Multidetector CT imaging of the abdomen and pelvis was performed using the standard protocol following bolus administration of intravenous contrast. CONTRAST:  176mL OMNIPAQUE IOHEXOL 300 MG/ML  SOLN COMPARISON:  07/11/2017 FINDINGS: Lower chest: No acute pleural or parenchymal lung disease. Hepatobiliary: No focal liver abnormality is seen. Status post cholecystectomy. No biliary dilatation. Pancreas: Unremarkable. No pancreatic ductal dilatation or surrounding inflammatory changes. Spleen: Normal in size without focal abnormality. Adrenals/Urinary Tract: There is chronic left renal cortical atrophy. Multiple nonobstructing left renal calculi are seen, largest measuring 7 mm. Distension of the left renal pelvis unchanged, consistent with known UPJ stenosis. There is compensatory hypertrophy of the right kidney. No right-sided calculi or obstruction. The adrenals are normal. Bladder is moderately distended with no focal abnormality. Stomach/Bowel: No bowel obstruction or ileus. No wall thickening or inflammatory change. Vascular/Lymphatic: Aortic atherosclerosis. No enlarged abdominal or pelvic lymph nodes. Reproductive: The  prostate is surgically absent. Other: No free fluid or free gas. No abdominal wall hernia. Musculoskeletal: No acute or destructive bony lesions. Reconstructed images demonstrate no additional findings. IMPRESSION: 1. Nonobstructing left renal calculi. 2. Chronic distension of the left renal pelvis consistent with chronic UPJ stenosis. 3. No evidence of bowel obstruction or ileus. 4. Aortic Atherosclerosis (ICD10-I70.0). Electronically Signed   By: Randa Ngo M.D.   On: 02/29/2020 23:52    Procedures Procedures (including critical care time)  Medications Ordered in ED Medications  iohexol (OMNIPAQUE) 9 MG/ML oral solution (  Contrast Given 02/29/20 2125)  iohexol (OMNIPAQUE) 300 MG/ML solution 125 mL (125 mLs Intravenous Contrast Given 02/29/20 2333)    ED Course  I have reviewed the triage vital signs and the nursing notes.  Pertinent labs & imaging results that were available during my care of the patient were reviewed by me and considered in my medical decision making (see chart for details).    MDM Rules/Calculators/A&P                          Justin Adams is a 74 y.o. male with a past medical history significant for prostate cancer, kidney stones, prior stroke, intracerebral hemorrhage and central venous thrombosis, polycythemia, prior cholecystectomy and prior appendectomy, hypertension, and dental extraction today who presents with abdominal pain and nausea.  According to patient, he had dental extraction today and did well.  He was given Tylenol 3 with codeine and after taking a dose this afternoon, started having severe abdominal discomfort.  He reports that he got pale and lightheaded and started having severe abdominal pain.  Describes that 10 out of 10 in severity at onset.  It has been waxing and waning ever since and he is currently having very minimal pain.  He reports no vomiting but had nausea.  He reports that he has some inconsistency with his bowel movements and  chronically goes several days without bowel movements.  He says that he normally passes lots of gas but since taking the medication in his procedure today, he has not passed any gas.  He is also not had any bowel movements.  He reports no fevers but did have some chills.  Denies any cough congestion, chest pain, shortness of breath.  No history of DVT or PE otherwise.  On exam, lungs are clear and chest is nontender.  Abdomen is nontender on my exam but I did not hear loud bowel sounds.  No flank tenderness or back tenderness.  No rashes.  Good pulses in extremities with normal sensation and strength in extremities.  Clinically I suspect that patient is taking the codeine may have caused some GI slowdown or even the symptoms similar to a bowel obstruction.  However, he has had 3 reported abdominal surgeries in the past including appendectomy and 2 surgeries on his gallbladder, he may have adhesions that could lead to a bowel obstruction.  Family is concerned about a bowel problem as they have had to family emergency has had obstructions needing emergent surgery.  He went to urgent care initially and was found have a blood pressure in the 80s.  He decided to come the emergency department instead for his abdominal pain with soft blood pressures.  We will get a CT scan with oral contrast to make sure there is no obstruction.  At this time, he does not want pain medicine or nausea medicine.  Patient had some labs in triage which showed slight elevation in AST and T bili.  Also slight elevation in lipase up to 78.  We will add on lactic acid and Covid test in case he needs a surgery.  We will get the CT scan.  We will check urinalysis.  Anticipate discharge if work-up is reassuring.  In this facility, patient's blood pressures have been normotensive without any hypotension.  He is feeling much better.  12:05 AM CT scan returned and did not show any evidence  of ileus or obstruction.  CT scan did show evidence  of stones in his left kidney as well as some stenosis which he knows about.  He will follow-up with his urologist for this.  He was feeling better and was able to eat and drink without difficulty.  Suspect his narcotic use today caused a transient ileus or slowdown of his bowels which are already regular.  Patient was observed for over 7 hours with constantly improving symptoms and are now nearly resolved.  Patient understood return precautions and follow-up instructions.  No other questions or concerns.  Patient discharged in good condition with improved symptoms.   Final Clinical Impression(s) / ED Diagnoses Final diagnoses:  Generalized abdominal pain  Nausea    Rx / DC Orders ED Discharge Orders    None     Clinical Impression: 1. Generalized abdominal pain   2. Nausea     Disposition: Discharge  Condition: Good  I have discussed the results, Dx and Tx plan with the pt(& family if present). He/she/they expressed understanding and agree(s) with the plan. Discharge instructions discussed at great length. Strict return precautions discussed and pt &/or family have verbalized understanding of the instructions. No further questions at time of discharge.    New Prescriptions   No medications on file    Follow Up: [name], MD [address] [place] [address] [place] [address] [id number] mc [place]       Patina Spanier, Gwenyth Allegra, MD 03/01/20 [phone]

## 2020-02-29 NOTE — ED Notes (Signed)
After reviewing patient's chief complaint and noting blood pressure went to speak to Dr. Mannie Stabile about patient.  Upon returning, Dr. Mannie Stabile had stated patient could wait to see a provider, no ER yet.  Patient decided with his wife they would like to go to ER after noting the blood pressure.  Patient able to ambulate independently from [facility].  Will d/c.

## 2020-03-01 LAB — URINE CULTURE

## 2020-03-01 NOTE — Discharge Instructions (Signed)
Your work-up today did not show any evidence of obstruction or ileus.  I suspect your symptoms were related to the narcotic you took caused a mild slowdown of your already irregular bowels.  Your work-up here was otherwise reassuring and you did not have any further low blood pressures.  I suspect her low blood pressure was related to you having a vagal episode in the setting of the pain ways.  Your labs were otherwise reassuring and similar to prior.  Please push hydration and follow-up with your urologist to the kidney stones we discussed.  Please avoid the narcotics as this may cause similar abdominal symptoms.  If any symptoms change or worsen, please return to the nearest emergency department.

## 2020-03-06 ENCOUNTER — Ambulatory Visit: Payer: Medicare Other | Admitting: Physical Therapy

## 2020-03-08 ENCOUNTER — Encounter (INDEPENDENT_AMBULATORY_CARE_PROVIDER_SITE_OTHER): Payer: Self-pay | Admitting: Ophthalmology

## 2020-03-08 ENCOUNTER — Other Ambulatory Visit: Payer: Self-pay

## 2020-03-08 ENCOUNTER — Ambulatory Visit (INDEPENDENT_AMBULATORY_CARE_PROVIDER_SITE_OTHER): Payer: Medicare Other | Admitting: Ophthalmology

## 2020-03-08 DIAGNOSIS — H353132 Nonexudative age-related macular degeneration, bilateral, intermediate dry stage: Secondary | ICD-10-CM

## 2020-03-08 DIAGNOSIS — H353221 Exudative age-related macular degeneration, left eye, with active choroidal neovascularization: Secondary | ICD-10-CM

## 2020-03-08 MED ORDER — BEVACIZUMAB CHEMO INJECTION 1.25MG/0.05ML SYRINGE FOR KALEIDOSCOPE
1.2500 mg | INTRAVITREAL | Status: AC | PRN
  Administered 2020-03-08: 1.25 mg via INTRAVITREAL

## 2020-03-08 NOTE — Progress Notes (Signed)
03/08/2020     CHIEF COMPLAINT Patient presents for Retina Follow Up   HISTORY OF PRESENT ILLNESS: Justin Adams is a 74 y.o. male who presents to the clinic today for:   HPI    Retina Follow Up    Patient presents with  Wet AMD.  In left eye.  This started 7 weeks ago.  Severity is mild.  Duration of 7 weeks.  Since onset it is stable.          Comments    7 Week AMD F/U OU, poss Avastin OS  Pt denies noticeable changes to [place] OU since last visit. Pt denies ocular pain, flashes of light, or floaters OU.         Last edited by Rockie Neighbours, [place] on 03/08/2020  8:24 AM. (History)      Referring physician: [name], MD [address] [place],  [place] [address]  HISTORICAL INFORMATION:   Selected notes from the MEDICAL RECORD NUMBER    Lab Results  Component Value Date   HGBA1C 5.3 07/11/2017     CURRENT MEDICATIONS: No current outpatient medications on file. (Ophthalmic Drugs)   No current facility-administered medications for this visit. (Ophthalmic Drugs)   Current Outpatient Medications (Other)  Medication Sig  . acetaminophen (TYLENOL) 500 MG tablet Take 500-1,000 mg by mouth daily as needed for moderate pain or headache.  Marland Kitchen amLODipine (NORVASC) 2.5 MG tablet Take 1 tablet (2.5 mg total) by mouth daily as needed (if systolic bp is over 694).  Marland Kitchen apixaban (ELIQUIS) 5 MG TABS tablet Take 1 tablet (5 mg total) by mouth 2 (two) times daily.  Marland Kitchen atorvastatin (LIPITOR) 20 MG tablet Take 1 tablet (20 mg total) by mouth daily.  Marland Kitchen lisinopril (PRINIVIL,ZESTRIL) 20 MG tablet Take 1 tablet (20 mg total) by mouth daily.  . Melatonin 3 MG TABS Take 3 mg by mouth at bedtime as needed (sleep).  . polyethylene glycol (MIRALAX / GLYCOLAX) packet Take 17 g by mouth at bedtime.  . Wheat Dextrin (BENEFIBER) POWD Take 1 Dose by mouth daily. (Patient not taking: Reported on 12/10/2019)   No current facility-administered medications for this visit. (Other)       REVIEW OF SYSTEMS:    ALLERGIES No Known Allergies  PAST MEDICAL HISTORY Past Medical History:  Diagnosis Date  . Adenomatous polyp   . Bleeding internal hemorrhoids 10/2017  . Cerebral venous thrombosis   . Grade I diastolic dysfunction 50/38/8828   noted on ECHO   . Hemorrhoids   . History of kidney stones   . Hypercholesteremia   . Hypertension   . Intracranial hemorrhage (HCC)    Right temporal lobe  . LVH (left ventricular hypertrophy) 07/11/2017   Mild, noted on ECHO   . Muscular degeneration   . Nephrolithiasis   . Obesity   . OSA (obstructive sleep apnea)   . Polycythemia   . Prostate cancer ([place]) 2003  . Shingles   . Stroke ([place]) 06/2017   No residual  . Unsteady gait   . Ureteral calculi 02/12/2008   Left   Past Surgical History:  Procedure Laterality Date  . APPENDECTOMY     childhood  . CHOLECYSTECTOMY    . COLONOSCOPY    . HEMORRHOID BANDING  2015  . HOLMIUM LASER APPLICATION Left 00/08/4915   Procedure: HOLMIUM LASER APPLICATION;  Surgeon: Franchot Gallo, MD;  Location: [facility];  Service: Urology;  Laterality: Left;  . IR ANGIO EXTERNAL CAROTID SEL  EXT CAROTID UNI R MOD SED  07/10/2017  . IR ANGIO INTRA EXTRACRAN SEL INTERNAL CAROTID BILAT MOD SED  07/10/2017  . IR ANGIO VERTEBRAL SEL VERTEBRAL BILAT MOD SED  07/10/2017  . IR URETERAL STENT LEFT NEW ACCESS W/O SEP NEPHROSTOMY CATH  03/12/2018  . LEFT HEART CATHETERIZATION WITH CORONARY ANGIOGRAM N/A 03/15/2013   Procedure: LEFT HEART CATHETERIZATION WITH CORONARY ANGIOGRAM;  Surgeon: Minus Breeding, MD;  Location: [facility] CATH LAB;  Service: Cardiovascular;  Laterality: N/A;  . NEPHROLITHOTOMY Left 03/12/2018   Procedure: LEFT NEPHROLITHOTOMY PERCUTANEOUS, STENT PLACEMENT;  Surgeon: Franchot Gallo, MD;  Location: [facility];  Service: Urology;  Laterality: Left;  . PROSTATECTOMY    . STONE EXTRACTION WITH BASKET      FAMILY HISTORY Family History  Problem Relation Age of Onset  . Hypertension  Mother   . Coronary artery disease Mother 39  . Breast cancer Mother   . Coronary artery disease Father 32       Died age 3  . CAD Brother 53    SOCIAL HISTORY Social History   Tobacco Use  . Smoking status: Never Smoker  . Smokeless tobacco: Never Used  Vaping Use  . Vaping Use: Never used  Substance Use Topics  . Alcohol use: No  . Drug use: No         OPHTHALMIC EXAM:  Base Eye Exam    Visual Acuity (ETDRS)      Right Left   Dist cc 20/25 +1 E card @ 4'   Dist ph cc  NI   Correction: Glasses       Tonometry (Tonopen, 8:25 AM)      Right Left   Pressure 13 14       Pupils      Pupils Dark Light Shape React APD   Right PERRL 4 3 Round Brisk None   Left PERRL 4 3 Round Brisk None       Visual Fields (Counting fingers)      Left Right    Full Full       Extraocular Movement      Right Left    Full Full       Neuro/Psych    Oriented x3: Yes   Mood/Affect: Normal       Dilation    Both eyes: 1.0% Mydriacyl, 2.5% Phenylephrine @ 8:27 AM        Slit Lamp and Fundus Exam    External Exam      Right Left   External Normal Normal       Slit Lamp Exam      Right Left   Lids/Lashes Normal Normal   Conjunctiva/Sclera White and quiet White and quiet   Cornea Clear Clear   Anterior Chamber Deep and quiet Deep and quiet   Iris Round and reactive Round and reactive   Lens Posterior chamber intraocular lens Posterior chamber intraocular lens   Anterior Vitreous Normal Normal       Fundus Exam      Right Left   Posterior Vitreous  Posterior vitreous detachment, Central vitreous floaters   Disc  Normal   C/D Ratio  0.45   Macula  Macular thickening, Subretinal neovascular membrane, Retinal pigment epithelial mottling, Subretinal fibrosis, Hard drusen, Disciform scar, centrally with subretinal hemorrhage peripheral scarring   Vessels  Normal   Periphery  Normal          IMAGING AND PROCEDURES  Imaging and Procedures for 03/08/20  OCT,  Retina - OU - Both Eyes       Right Eye Quality was good. Scan locations included subfoveal. Central Foveal Thickness: 292. Progression has been stable.   Left Eye Quality was good. Scan locations included subfoveal. Central Foveal Thickness: 276. Progression has improved.   Notes Less perimacular thickening and intraretinal fluid left eye, as the goal of treatment is to prevent scotoma enlargement currently at 7-week follow-up interval left eye post Avastin  Active maculopathy OD       Intravitreal Injection, Pharmacologic Agent - OS - Left Eye       Time Out 03/08/2020. 9:04 AM. Confirmed correct patient, procedure, site, and patient consented.   Anesthesia Topical anesthesia was used. Anesthetic medications included Akten 3.5%.   Procedure Preparation included Tobramycin 0.3%, 10% betadine to eyelids, 5% betadine to ocular surface. A supplied needle was used.   Injection:  1.25 mg Bevacizumab (AVASTIN) SOLN   NDC: [address]-4   Route: Intravitreal, Site: Left Eye, Waste: 0 mg  Post-op Post injection exam found visual acuity of at least counting fingers. The patient tolerated the procedure well. There were no complications. The patient received written and verbal post procedure care education. Post injection medications were not given.                 ASSESSMENT/PLAN:  Exudative age-related macular degeneration of left eye with active choroidal neovascularization (HCC) The nature of wet macular degeneration was discussed with the patient.  Forms of therapy reviewed include the use of Anti-VEGF medications injected painlessly into the eye, as well as other possible treatment modalities, including thermal laser therapy. Fellow eye involvement and risks were discussed with the patient. Upon the finding of wet age related macular degeneration, treatment will be offered. The treatment regimen is on a treat as needed basis with the intent to treat if necessary and extend  interval of exams when possible. On average 1 out of 6 patients do not need lifetime therapy. However, the risk of recurrent disease is high for a lifetime.  Initially monthly, then periodic, examinations and evaluations will determine whether the next treatment is required on the day of the examination. We will disciform scar but with active lesion clinically and on OCT yet still improving at 7-week interval  Intermediate stage nonexudative age-related macular degeneration of both eyes No active disease right eye      ICD-10-CM   1. Exudative age-related macular degeneration of left eye with active choroidal neovascularization (HCC)  H35.3221 OCT, Retina - OU - Both Eyes    Intravitreal Injection, Pharmacologic Agent - OS - Left Eye    Bevacizumab (AVASTIN) SOLN 1.25 mg  2. Intermediate stage nonexudative age-related macular degeneration of both eyes  H35.3132     1.  Repeat injection intravitreal Avastin OS today at 7-week to prevent enlargement of the central scotoma left eye from disciform scar 2.  Signs of progression of disease right eye  3.  Ophthalmic Meds Ordered this visit:  Meds ordered this encounter  Medications  . Bevacizumab (AVASTIN) SOLN 1.25 mg       Return in about 8 weeks (around 05/03/2020) for dilate, OS, AVASTIN OCT.  Patient Instructions  Patient instructed to notify the office promptly if new visual acuity changes or difficulties arise in either eye    Explained the diagnoses, plan, and follow up with the patient and they expressed understanding.  Patient expressed understanding of the importance of proper follow up care.   Clent Demark Yui Mulvaney M.D. Diseases &  Surgery of the Retina and Vitreous [facility] [place] 03/08/20     Abbreviations: M myopia (nearsighted); A astigmatism; H hyperopia (farsighted); P presbyopia; Mrx spectacle prescription;  CTL contact lenses; OD right eye; OS left eye; OU both eyes  XT exotropia; ET esotropia; PEK  punctate epithelial keratitis; PEE punctate epithelial erosions; DES dry eye syndrome; MGD meibomian gland dysfunction; ATs artificial tears; PFAT's preservative free artificial tears; [place] nuclear sclerotic cataract; PSC posterior subcapsular cataract; ERM epi-retinal membrane; PVD posterior vitreous detachment; RD retinal detachment; DM diabetes mellitus; DR diabetic retinopathy; NPDR non-proliferative diabetic retinopathy; PDR proliferative diabetic retinopathy; CSME clinically significant macular edema; DME diabetic macular edema; dbh dot blot hemorrhages; CWS cotton wool spot; POAG primary open angle glaucoma; C/D cup-to-disc ratio; HVF humphrey visual field; GVF goldmann visual field; OCT optical coherence tomography; IOP intraocular pressure; BRVO Branch retinal vein occlusion; CRVO central retinal vein occlusion; CRAO central retinal artery occlusion; BRAO branch retinal artery occlusion; RT retinal tear; SB scleral buckle; PPV pars plana vitrectomy; VH Vitreous hemorrhage; PRP panretinal laser photocoagulation; IVK intravitreal kenalog; VMT vitreomacular traction; MH Macular hole;  NVD neovascularization of the disc; NVE neovascularization elsewhere; AREDS age related eye disease study; ARMD age related macular degeneration; POAG primary open angle glaucoma; EBMD epithelial/anterior basement membrane dystrophy; ACIOL anterior chamber intraocular lens; IOL intraocular lens; PCIOL posterior chamber intraocular lens; Phaco/IOL phacoemulsification with intraocular lens placement; [place] photorefractive keratectomy; LASIK laser assisted in situ keratomileusis; HTN hypertension; DM diabetes mellitus; COPD chronic obstructive pulmonary disease

## 2020-03-08 NOTE — Patient Instructions (Signed)
Patient instructed to notify the office promptly if new visual acuity changes or difficulties arise in either eye

## 2020-03-08 NOTE — Assessment & Plan Note (Signed)
The nature of wet macular degeneration was discussed with the patient.  Forms of therapy reviewed include the use of Anti-VEGF medications injected painlessly into the eye, as well as other possible treatment modalities, including thermal laser therapy. Fellow eye involvement and risks were discussed with the patient. Upon the finding of wet age related macular degeneration, treatment will be offered. The treatment regimen is on a treat as needed basis with the intent to treat if necessary and extend interval of exams when possible. On average 1 out of 6 patients do not need lifetime therapy. However, the risk of recurrent disease is high for a lifetime.  Initially monthly, then periodic, examinations and evaluations will determine whether the next treatment is required on the day of the examination. We will disciform scar but with active lesion clinically and on OCT yet still improving at 7-week interval

## 2020-03-08 NOTE — Assessment & Plan Note (Signed)
No active disease right eye

## 2020-03-21 DIAGNOSIS — Z23 Encounter for immunization: Secondary | ICD-10-CM | POA: Diagnosis not present

## 2020-03-22 DIAGNOSIS — Z23 Encounter for immunization: Secondary | ICD-10-CM | POA: Diagnosis not present

## 2020-03-23 ENCOUNTER — Encounter: Payer: Self-pay | Admitting: Physical Therapy

## 2020-03-23 ENCOUNTER — Ambulatory Visit (INDEPENDENT_AMBULATORY_CARE_PROVIDER_SITE_OTHER): Payer: Medicare Other | Admitting: Physical Therapy

## 2020-03-23 ENCOUNTER — Other Ambulatory Visit: Payer: Self-pay

## 2020-03-23 DIAGNOSIS — M545 Low back pain, unspecified: Secondary | ICD-10-CM | POA: Diagnosis not present

## 2020-03-23 DIAGNOSIS — M6281 Muscle weakness (generalized): Secondary | ICD-10-CM

## 2020-03-23 DIAGNOSIS — G8929 Other chronic pain: Secondary | ICD-10-CM | POA: Diagnosis not present

## 2020-03-23 DIAGNOSIS — R2681 Unsteadiness on feet: Secondary | ICD-10-CM

## 2020-03-23 NOTE — Therapy (Signed)
[place], [place], [address] Phone: [phone]   Fax:  [phone]  Physical Therapy Evaluation  Patient Details  Name: Justin Adams MRN: [id number] Date of Birth: 10/31/1945 Referring Provider (PT): [place] MD   Encounter Date: 03/23/2020   PT End of Session - 03/23/20 1120    Visit Number 1    Number of Visits 18    Date for PT Re-Evaluation 06/15/20    Authorization Type MCR, needs KX    PT Start Time 1020    PT Stop Time 1105    PT Time Calculation (min) 45 min    Activity Tolerance Patient tolerated treatment well    Behavior During Therapy [facility] for tasks assessed/performed           Past Medical History:  Diagnosis Date  . Adenomatous polyp   . Bleeding internal hemorrhoids 10/2017  . Cerebral venous thrombosis   . Grade I diastolic dysfunction 14/97/0263   noted on ECHO   . Hemorrhoids   . History of kidney stones   . Hypercholesteremia   . Hypertension   . Intracranial hemorrhage (HCC)    Right temporal lobe  . LVH (left ventricular hypertrophy) 07/11/2017   Mild, noted on ECHO   . Muscular degeneration   . Nephrolithiasis   . Obesity   . OSA (obstructive sleep apnea)   . Polycythemia   . Prostate cancer ([place]) 2003  . Shingles   . Stroke ([place]) 06/2017   No residual  . Unsteady gait   . Ureteral calculi 02/12/2008   Left    Past Surgical History:  Procedure Laterality Date  . APPENDECTOMY     childhood  . CHOLECYSTECTOMY    . COLONOSCOPY    . HEMORRHOID BANDING  2015  . HOLMIUM LASER APPLICATION Left 78/10/8848   Procedure: HOLMIUM LASER APPLICATION;  Surgeon: Franchot Gallo, MD;  Location: [facility];  Service: Urology;  Laterality: Left;  . IR ANGIO EXTERNAL CAROTID SEL EXT CAROTID UNI R MOD SED  07/10/2017  . IR ANGIO INTRA EXTRACRAN SEL INTERNAL CAROTID BILAT MOD SED  07/10/2017  . IR ANGIO VERTEBRAL SEL VERTEBRAL BILAT MOD SED  07/10/2017  . IR URETERAL STENT LEFT NEW  ACCESS W/O SEP NEPHROSTOMY CATH  03/12/2018  . LEFT HEART CATHETERIZATION WITH CORONARY ANGIOGRAM N/A 03/15/2013   Procedure: LEFT HEART CATHETERIZATION WITH CORONARY ANGIOGRAM;  Surgeon: Minus Breeding, MD;  Location[facility] CATH LAB;  Service: Cardiovascular;  Laterality: N/A;  . NEPHROLITHOTOMY Left 03/12/2018   Procedure: LEFT NEPHROLITHOTOMY PERCUTANEOUS, STENT PLACEMENT;  Surgeon: Franchot Gallo, MD;  Location: [facility];  Service: Urology;  Laterality: Left;  . PROSTATECTOMY    . STONE EXTRACTION WITH BASKET      There were no vitals filed for this visit.    Subjective Assessment - 03/23/20 1023    Subjective He relays he had 2 falls since stopping PT, one was 2 months ago, one was 3 weeks ago. He mistepped once and the other time he was getting something out of his closet and lost his balance anterioroly. He had follow up with MD who wants him to start PT again. He is still having back pain paticularly first thing in the morning or with walking more than .75 miles or standing in one place    Limitations Lifting;House hold activities    Patient Stated Goals reduce pain, improve balance    Currently in Pain? Yes    Pain Score 8     Pain  Location Back    Pain Orientation Lower    Pain Descriptors / Indicators Aching    Pain Type Chronic pain    Pain Radiating Towards denies    Pain Onset More than a month ago    Pain Frequency Intermittent    Aggravating Factors  first thing in the morning or with walking more than .75 miles or standing in one place    Pain Relieving Factors moving around helps it    Multiple Pain Sites No              [facility] PT Assessment - 03/23/20 0001      Assessment   Medical Diagnosis LBP, balance, general weakness    Referring Provider (PT) [place] MD    Onset Date/Surgical Date --   Chronic back pain for years   Next MD Visit nothing scheduled    Prior Therapy PT earlier this year at this clinic      Precautions   Precautions Fall       Restrictions   Weight Bearing Restrictions No      Balance Screen   Has the patient fallen in the past 6 months Yes    How many times? 2    Has the patient had a decrease in activity level because of a fear of falling?  Yes    Is the patient reluctant to leave their home because of a fear of falling?  Yes      [place] residence      Prior Function   Level of Independence Independent    Vocation Retired    Leisure walk, grandkids      Cognition   Overall Cognitive Status Within Functional Limits for tasks assessed      Observation/Other Assessments   Focus on Therapeutic Outcomes (FOTO)  75% functional score, predicted is 74%      Functional Tests   Functional tests Single leg stance      Single Leg Stance   Comments 3 sec avg on Rt, 7 sec avg on Lt      Posture/Postural Control   Posture Comments slumped posture, kyphosis      ROM / Strength   AROM / PROM / Strength Strength;AROM      AROM   AROM Assessment Site Lumbar    Lumbar Flexion 75%    Lumbar Extension 10%    Lumbar - Right Side Bend 50%    Lumbar - Left Side Bend 50%    Lumbar - Right Rotation 50%    Lumbar - Left Rotation 50%      Strength   Strength Assessment Site Hip;Knee;Ankle    Right/Left Hip Right;Left    Right Hip Flexion 4/5    Right Hip ABduction 4+/5    Left Hip Flexion 4+/5    Left Hip ABduction 4+/5    Right/Left Knee Right;Left    Right Knee Flexion 4+/5    Right Knee Extension 4+/5    Left Knee Flexion 4+/5    Left Knee Extension 4+/5    Right/Left Ankle Right;Left    Right Ankle Dorsiflexion 4+/5    Left Ankle Dorsiflexion 4+/5      6 Minute Walk- Baseline   6 Minute Walk- Baseline --      6 minute walk test results    Aerobic Endurance Distance Walked 1152      Standardized Balance Assessment   Standardized Balance Assessment Five Times Sit to Stand;Timed Up and  Go Test    Five times sit to stand comments  19.3 sec      Timed Up and  Go Test   Normal TUG (seconds) 10.5                      Objective measurements completed on examination: See above findings.               PT Education - 03/23/20 1120    Education Details HEP    Person(s) Educated Patient    Methods Explanation    Comprehension Verbalized understanding            PT Short Term Goals - 03/23/20 1127      PT SHORT TERM GOAL #1   Title Pt will report performing walking program, HEP and [employer] 2-3 days/week    Time 4    Period Weeks    Status New    Target Date 04/20/20      PT SHORT TERM GOAL #2   Title Pt will improve functional LE strength decreasing five time sit to stand to </= 15 seconds    Baseline 19    Time 4    Period Weeks    Status New             PT Long Term Goals - 03/23/20 1127      PT LONG TERM GOAL #1   Title Pt will improve 6 minute walk test by 160 ft(one extra lap) to show improved endurance and gait speed.    Time 12    Period Weeks    Status New    Target Date 06/15/20      PT LONG TERM GOAL #2   Title Pt will increaese overall leg strength to at least 4+ to 5- overall to improve function    Baseline 4 to 4+    Time 12    Period Weeks    Status New      PT LONG TERM GOAL #3   Title Pt will improve 5 times sit to stand test to less than 13 seconds    Baseline 19    Time 12    Period Weeks    Status New                  Plan - 03/23/20 1121    Clinical Impression Statement Pt returns back to PT with new referral from MD for gait and balance as he has had 2 falls since stopping PT. He has overall decreased balance, decreased overall strength, decreased endurance, decreased lumbar ROM, increased back pain and is at an increased risk of falling. He will benefit from skilled PT to address his defciits and PT is medically necessary.    Personal Factors and Comorbidities Comorbidity 3+    Comorbidities CVA, sleep apnea, chronic LBP, history of falls     Examination-Activity Limitations Carry;Bend;Bed Mobility;Dressing;Lift;Stand;Stairs;Squat;Sleep;Locomotion Level    Examination-Participation Restrictions Cleaning;Community Activity;Driving;Laundry;Yard Work;Volunteer    Stability/Clinical Decision Making Evolving/Moderate complexity    Clinical Decision Making Moderate    Rehab Potential Good    PT Frequency 2x / week   1-2   PT Duration 12 weeks    PT Treatment/Interventions ADLs/Self Care Home Management;Cryotherapy;Electrical Stimulation;Moist Heat;Traction;Gait Scientist, forensic;Therapeutic activities;Therapeutic exercise;Balance training;Neuromuscular re-education;Manual techniques;Passive range of motion;Dry needling;Joint Manipulations;Spinal Manipulations;Taping    PT Next Visit Plan balance, leg strength, endurance    PT Home Exercise Plan tandem balance, SLS, squats, sit to stands  Consulted and Agree with Plan of Care Patient           Patient will benefit from skilled therapeutic intervention in order to improve the following deficits and impairments:  Decreased activity tolerance, Decreased balance, Decreased endurance, Decreased mobility, Decreased range of motion, Decreased strength, Difficulty walking, Increased muscle spasms, Impaired flexibility, Postural dysfunction, Improper body mechanics, Pain  Visit Diagnosis: Unsteadiness on feet  Muscle weakness (generalized)  Chronic bilateral low back pain without sciatica     Problem List Patient Active Problem List   Diagnosis Date Noted  . Exudative age-related macular degeneration of left eye with active choroidal neovascularization ([place]) 10/11/2019  . Intermediate stage nonexudative age-related macular degeneration of both eyes 10/11/2019  . Operculated retinal tear of left eye 10/11/2019  . Right posterior capsular opacification 10/11/2019  . Posterior vitreous detachment of left eye 10/11/2019  . [place] retinal degeneration of right eye 10/11/2019    . Calculus of kidney 03/12/2018  . Bleeding internal hemorrhoids 10/09/2017  . Sleep apnea 09/22/2017  . History of adenomatous polyp of colon 09/18/2017  . Prostate cancer ([place]) 09/18/2017  . Essential hypertension 08/26/2017  . Hyperlipidemia 08/26/2017  . Cerebral venous thrombosis 07/13/2017  . Intracerebral hemorrhage 07/10/2017  . ICH (intracerebral hemorrhage) ([place]) 07/10/2017  . Unstable angina [facility]) 03/12/2013    Silvestre Mesi 03/23/2020, 11:31 AM  [facility] [address] [place], [place], [address] Phone: [phone]   Fax:  [phone]  Name: QUENTION MCNEILL MRN: [id number] Date of Birth: [birth year]

## 2020-03-29 ENCOUNTER — Encounter: Payer: Medicare Other | Admitting: Physical Therapy

## 2020-04-11 ENCOUNTER — Other Ambulatory Visit: Payer: Self-pay

## 2020-04-11 ENCOUNTER — Ambulatory Visit: Payer: Medicare Other | Admitting: Physical Therapy

## 2020-04-11 ENCOUNTER — Ambulatory Visit (INDEPENDENT_AMBULATORY_CARE_PROVIDER_SITE_OTHER): Payer: Medicare Other | Admitting: Physical Therapy

## 2020-04-11 DIAGNOSIS — M545 Low back pain, unspecified: Secondary | ICD-10-CM

## 2020-04-11 DIAGNOSIS — R29818 Other symptoms and signs involving the nervous system: Secondary | ICD-10-CM | POA: Diagnosis not present

## 2020-04-11 DIAGNOSIS — G8929 Other chronic pain: Secondary | ICD-10-CM

## 2020-04-11 DIAGNOSIS — M6281 Muscle weakness (generalized): Secondary | ICD-10-CM

## 2020-04-11 DIAGNOSIS — I69154 Hemiplegia and hemiparesis following nontraumatic intracerebral hemorrhage affecting left non-dominant side: Secondary | ICD-10-CM

## 2020-04-11 DIAGNOSIS — R2681 Unsteadiness on feet: Secondary | ICD-10-CM

## 2020-04-11 NOTE — Therapy (Signed)
[place], [place], [address] Phone: [phone]   Fax:  [phone]  Physical Therapy Treatment  Patient Details  Name: Justin Adams MRN: [id number] Date of Birth: 1945-12-09 Referring Provider (PT): Stoneking MD   Encounter Date: 04/11/2020   PT End of Session - 04/11/20 0855    Visit Number 2    Number of Visits 18    Date for PT Re-Evaluation 06/15/20    Authorization Type MCR, needs KX    PT Start Time 0801    PT Stop Time 0849    PT Time Calculation (min) 48 min    Activity Tolerance Patient tolerated treatment well    Behavior During Therapy [facility] for tasks assessed/performed           Past Medical History:  Diagnosis Date  . Adenomatous polyp   . Bleeding internal hemorrhoids 10/2017  . Cerebral venous thrombosis   . Grade I diastolic dysfunction 62/56/3893   noted on ECHO   . Hemorrhoids   . History of kidney stones   . Hypercholesteremia   . Hypertension   . Intracranial hemorrhage (HCC)    Right temporal lobe  . LVH (left ventricular hypertrophy) 07/11/2017   Mild, noted on ECHO   . Muscular degeneration   . Nephrolithiasis   . Obesity   . OSA (obstructive sleep apnea)   . Polycythemia   . Prostate cancer ([place]) 2003  . Shingles   . Stroke ([place]) 06/2017   No residual  . Unsteady gait   . Ureteral calculi 02/12/2008   Left    Past Surgical History:  Procedure Laterality Date  . APPENDECTOMY     childhood  . CHOLECYSTECTOMY    . COLONOSCOPY    . HEMORRHOID BANDING  2015  . HOLMIUM LASER APPLICATION Left 73/09/2874   Procedure: HOLMIUM LASER APPLICATION;  Surgeon: Franchot Gallo, MD;  Location: [facility];  Service: Urology;  Laterality: Left;  . IR ANGIO EXTERNAL CAROTID SEL EXT CAROTID UNI R MOD SED  07/10/2017  . IR ANGIO INTRA EXTRACRAN SEL INTERNAL CAROTID BILAT MOD SED  07/10/2017  . IR ANGIO VERTEBRAL SEL VERTEBRAL BILAT MOD SED  07/10/2017  . IR URETERAL STENT LEFT NEW  ACCESS W/O SEP NEPHROSTOMY CATH  03/12/2018  . LEFT HEART CATHETERIZATION WITH CORONARY ANGIOGRAM N/A 03/15/2013   Procedure: LEFT HEART CATHETERIZATION WITH CORONARY ANGIOGRAM;  Surgeon: Minus Breeding, MD;  Location: [facility] CATH LAB;  Service: Cardiovascular;  Laterality: N/A;  . NEPHROLITHOTOMY Left 03/12/2018   Procedure: LEFT NEPHROLITHOTOMY PERCUTANEOUS, STENT PLACEMENT;  Surgeon: Franchot Gallo, MD;  Location: [facility];  Service: Urology;  Laterality: Left;  . PROSTATECTOMY    . STONE EXTRACTION WITH BASKET      There were no vitals filed for this visit.   Subjective Assessment - 04/11/20 0819    Subjective He has not been able to return to PT since eval due to being out of town and limited PT availability when he returned. He relays his back pain is worse in the mornings but then when he gets moving it gets better.    Limitations Lifting;House hold activities    Patient Stated Goals reduce pain, improve balance    Pain Onset More than a month ago            [facility] Adult PT Treatment/Exercise - 04/11/20 0001      Neuro Re-ed    Neuro Re-ed Details  on foam beam: sidestepping, tandem walk up/down 5 times, SLS  10 sec  X3 ea      Exercises   Exercises Lumbar      Lumbar Exercises: Stretches   Double Knee to Chest Stretch Limitations 10 reps 10 seconds with feet on pball    Lower Trunk Rotation 5 reps;10 seconds    Other Lumbar Stretch Exercise seated pball roll outs 10 sec X 10 for lumbar flexion       Lumbar Exercises: Aerobic   Tread Mill 2.6 MPH 6 min, .25 miles      Lumbar Exercises: Machines for Strengthening   Other Lumbar Machine Exercise row machine 35 lbs 3X15 reps    Other Lumbar Machine Exercise lat pull 35 lbs 2X15 reps      Lumbar Exercises: Seated   Sit to Stand Limitations 2X10 reps from 23.5 inch table      Lumbar Exercises: Supine   Bent Knee Raise 10 reps    Bent Knee Raise Limitations up/up, down/down    Dead Bug 10 reps    Bridge 5 seconds    Bridge  Limitations 2 sets of 10                    PT Short Term Goals - 03/23/20 1127      PT SHORT TERM GOAL #1   Title Pt will report performing walking program, HEP and [employer] 2-3 days/week    Time 4    Period Weeks    Status New    Target Date 04/20/20      PT SHORT TERM GOAL #2   Title Pt will improve functional LE strength decreasing five time sit to stand to </= 15 seconds    Baseline 19    Time 4    Period Weeks    Status New             PT Long Term Goals - 03/23/20 1127      PT LONG TERM GOAL #1   Title Pt will improve 6 minute walk test by 160 ft(one extra lap) to show improved endurance and gait speed.    Time 12    Period Weeks    Status New    Target Date 06/15/20      PT LONG TERM GOAL #2   Title Pt will increaese overall leg strength to at least 4+ to 5- overall to improve function    Baseline 4 to 4+    Time 12    Period Weeks    Status New      PT LONG TERM GOAL #3   Title Pt will improve 5 times sit to stand test to less than 13 seconds    Baseline 19    Time 12    Period Weeks    Status New                 Plan - 04/11/20 0856    Clinical Impression Statement Overall his balance appeared to be better since evaluation. Sesson focused on exercises to address his impairments in leg strength, lumbar tightness, balance, and posture. Continue POC    Personal Factors and Comorbidities Comorbidity 3+    Comorbidities CVA, sleep apnea, chronic LBP, history of falls    Examination-Activity Limitations Carry;Bend;Bed Mobility;Dressing;Lift;Stand;Stairs;Squat;Sleep;Locomotion Level    Examination-Participation Restrictions Cleaning;Community Activity;Driving;Laundry;Yard Work;Volunteer    Stability/Clinical Decision Making Evolving/Moderate complexity    Rehab Potential Good    PT Frequency 2x / week   1-2   PT Duration 12 weeks  PT Treatment/Interventions ADLs/Self Care Home Management;Cryotherapy;Electrical Stimulation;Moist  Heat;Traction;Gait Scientist, forensic;Therapeutic activities;Therapeutic exercise;Balance training;Neuromuscular re-education;Manual techniques;Passive range of motion;Dry needling;Joint Manipulations;Spinal Manipulations;Taping    PT Next Visit Plan balance, leg strength, endurance    PT Home Exercise Plan tandem balance, SLS, squats, sit to stands    Consulted and Agree with Plan of Care Patient           Patient will benefit from skilled therapeutic intervention in order to improve the following deficits and impairments:  Decreased activity tolerance, Decreased balance, Decreased endurance, Decreased mobility, Decreased range of motion, Decreased strength, Difficulty walking, Increased muscle spasms, Impaired flexibility, Postural dysfunction, Improper body mechanics, Pain  Visit Diagnosis: Unsteadiness on feet  Muscle weakness (generalized)  Chronic bilateral low back pain without sciatica  Hemiplegia and hemiparesis following nontraumatic intracerebral hemorrhage affecting left non-dominant side (HCC)  Other symptoms and signs involving the nervous system     Problem List Patient Active Problem List   Diagnosis Date Noted  . Exudative age-related macular degeneration of left eye with active choroidal neovascularization ([place]) 10/11/2019  . Intermediate stage nonexudative age-related macular degeneration of both eyes 10/11/2019  . Operculated retinal tear of left eye 10/11/2019  . Right posterior capsular opacification 10/11/2019  . Posterior vitreous detachment of left eye 10/11/2019  . Paving stone retinal degeneration of right eye 10/11/2019  . Calculus of kidney 03/12/2018  . Bleeding internal hemorrhoids 10/09/2017  . Sleep apnea 09/22/2017  . History of adenomatous polyp of colon 09/18/2017  . Prostate cancer ([place]) 09/18/2017  . Essential hypertension 08/26/2017  . Hyperlipidemia 08/26/2017  . Cerebral venous thrombosis 07/13/2017  . Intracerebral hemorrhage  07/10/2017  . ICH (intracerebral hemorrhage) ([place]) 07/10/2017  . Unstable angina ([place]) 03/12/2013    Silvestre Mesi 04/11/2020, 9:04 AM  [facility] Physical Therapy [address] [place], [place], [address] Phone: [phone]   Fax:  [phone]  Name: Justin Adams MRN: [id number] Date of Birth: [birth year]

## 2020-04-18 ENCOUNTER — Encounter: Payer: Medicare Other | Admitting: Physical Therapy

## 2020-04-20 ENCOUNTER — Ambulatory Visit (INDEPENDENT_AMBULATORY_CARE_PROVIDER_SITE_OTHER): Payer: Medicare Other | Admitting: Physical Therapy

## 2020-04-20 ENCOUNTER — Other Ambulatory Visit: Payer: Self-pay

## 2020-04-20 DIAGNOSIS — R2681 Unsteadiness on feet: Secondary | ICD-10-CM | POA: Diagnosis not present

## 2020-04-20 DIAGNOSIS — M6281 Muscle weakness (generalized): Secondary | ICD-10-CM

## 2020-04-20 DIAGNOSIS — R29818 Other symptoms and signs involving the nervous system: Secondary | ICD-10-CM | POA: Diagnosis not present

## 2020-04-20 DIAGNOSIS — G8929 Other chronic pain: Secondary | ICD-10-CM

## 2020-04-20 DIAGNOSIS — M545 Low back pain, unspecified: Secondary | ICD-10-CM

## 2020-04-20 DIAGNOSIS — I69154 Hemiplegia and hemiparesis following nontraumatic intracerebral hemorrhage affecting left non-dominant side: Secondary | ICD-10-CM | POA: Diagnosis not present

## 2020-04-20 NOTE — Therapy (Signed)
[facility] Physical Therapy [address] [place], [place], [address] Phone: [phone]   Fax:  [phone]  Physical Therapy Treatment  Patient Details  Name: Justin Adams MRN: [id number] Date of Birth: 1945-11-07 Referring Provider (PT): Stoneking MD   Encounter Date: 04/20/2020   PT End of Session - 04/20/20 0941    Visit Number 3    Number of Visits 18    Date for PT Re-Evaluation 06/15/20    Authorization Type MCR, needs KX    PT Start Time 0803    PT Stop Time 0847    PT Time Calculation (min) 44 min    Activity Tolerance Patient tolerated treatment well    Behavior During Therapy [facility] for tasks assessed/performed           Past Medical History:  Diagnosis Date  . Adenomatous polyp   . Bleeding internal hemorrhoids 10/2017  . Cerebral venous thrombosis   . Grade I diastolic dysfunction 95/02/3266   noted on ECHO   . Hemorrhoids   . History of kidney stones   . Hypercholesteremia   . Hypertension   . Intracranial hemorrhage (HCC)    Right temporal lobe  . LVH (left ventricular hypertrophy) 07/11/2017   Mild, noted on ECHO   . Muscular degeneration   . Nephrolithiasis   . Obesity   . OSA (obstructive sleep apnea)   . Polycythemia   . Prostate cancer ([place]) 2003  . Shingles   . Stroke ([place]) 06/2017   No residual  . Unsteady gait   . Ureteral calculi 02/12/2008   Left    Past Surgical History:  Procedure Laterality Date  . APPENDECTOMY     childhood  . CHOLECYSTECTOMY    . COLONOSCOPY    . HEMORRHOID BANDING  2015  . HOLMIUM LASER APPLICATION Left 05/13/5808   Procedure: HOLMIUM LASER APPLICATION;  Surgeon: Franchot Gallo, MD;  Location: [facility];  Service: Urology;  Laterality: Left;  . IR ANGIO EXTERNAL CAROTID SEL EXT CAROTID UNI R MOD SED  07/10/2017  . IR ANGIO INTRA EXTRACRAN SEL INTERNAL CAROTID BILAT MOD SED  07/10/2017  . IR ANGIO VERTEBRAL SEL VERTEBRAL BILAT MOD SED  07/10/2017  . IR URETERAL STENT LEFT NEW  ACCESS W/O SEP NEPHROSTOMY CATH  03/12/2018  . LEFT HEART CATHETERIZATION WITH CORONARY ANGIOGRAM N/A 03/15/2013   Procedure: LEFT HEART CATHETERIZATION WITH CORONARY ANGIOGRAM;  Surgeon: Minus Breeding, MD;  Location: [facility] CATH LAB;  Service: Cardiovascular;  Laterality: N/A;  . NEPHROLITHOTOMY Left 03/12/2018   Procedure: LEFT NEPHROLITHOTOMY PERCUTANEOUS, STENT PLACEMENT;  Surgeon: Franchot Gallo, MD;  Location: [facility];  Service: Urology;  Laterality: Left;  . PROSTATECTOMY    . STONE EXTRACTION WITH BASKET      There were no vitals filed for this visit.   Subjective Assessment - 04/20/20 0937    Subjective He relays overall his back pain feels better. He brought in Pball that he purchaesed and would like more core exercises for this             [facility] Adult PT Treatment/Exercise - 04/20/20 0001      Neuro Re-ed    Neuro Re-ed Details  SLS with UE inttermitt UE support, tandem walk      Lumbar Exercises: Stretches   Gastroc Stretch 3 reps;30 seconds   slantboard   Other Lumbar Stretch Exercise seated pball roll outs 10 sec X 10 for lumbar flexion       Lumbar Exercises: Aerobic   Recumbent Bike  L5 X 10 min      Lumbar Exercises: Machines for Strengthening   Leg Press 100 lbs 3 sets of 10      Lumbar Exercises: Standing   Heel Raises Limitations Heel toe raises 3 X10 reps             PT Short Term Goals - 03/23/20 1127      PT SHORT TERM GOAL #1   Title Pt will report performing walking program, HEP and [employer] 2-3 days/week    Time 4    Period Weeks    Status New    Target Date 04/20/20      PT SHORT TERM GOAL #2   Title Pt will improve functional LE strength decreasing five time sit to stand to </= 15 seconds    Baseline 19    Time 4    Period Weeks    Status New             PT Long Term Goals - 03/23/20 1127      PT LONG TERM GOAL #1   Title Pt will improve 6 minute walk test by 160 ft(one extra lap) to show improved endurance and gait speed.     Time 12    Period Weeks    Status New    Target Date 06/15/20      PT LONG TERM GOAL #2   Title Pt will increaese overall leg strength to at least 4+ to 5- overall to improve function    Baseline 4 to 4+    Time 12    Period Weeks    Status New      PT LONG TERM GOAL #3   Title Pt will improve 5 times sit to stand test to less than 13 seconds    Baseline 19    Time 12    Period Weeks    Status New                 Plan - 04/20/20 0942    Clinical Impression Statement Contineud with stretching, strenghtening and balance today as tolerated. PT inflated his pball for him and will go over exercises for this next session when more time allows.    Personal Factors and Comorbidities Comorbidity 3+    Comorbidities CVA, sleep apnea, chronic LBP, history of falls    Examination-Activity Limitations Carry;Bend;Bed Mobility;Dressing;Lift;Stand;Stairs;Squat;Sleep;Locomotion Level    Examination-Participation Restrictions Cleaning;Community Activity;Driving;Laundry;Yard Work;Volunteer    Stability/Clinical Decision Making Evolving/Moderate complexity    Rehab Potential Good    PT Frequency 2x / week   1-2   PT Duration 12 weeks    PT Treatment/Interventions ADLs/Self Care Home Management;Cryotherapy;Electrical Stimulation;Moist Heat;Traction;Gait Scientist, forensic;Therapeutic activities;Therapeutic exercise;Balance training;Neuromuscular re-education;Manual techniques;Passive range of motion;Dry needling;Joint Manipulations;Spinal Manipulations;Taping    PT Next Visit Plan balance, leg strength, endurance, pball core    PT Home Exercise Plan tandem balance, SLS, squats, sit to stands    Consulted and Agree with Plan of Care Patient           Patient will benefit from skilled therapeutic intervention in order to improve the following deficits and impairments:  Decreased activity tolerance, Decreased balance, Decreased endurance, Decreased mobility, Decreased range of motion,  Decreased strength, Difficulty walking, Increased muscle spasms, Impaired flexibility, Postural dysfunction, Improper body mechanics, Pain  Visit Diagnosis: Unsteadiness on feet  Muscle weakness (generalized)  Chronic bilateral low back pain without sciatica  Hemiplegia and hemiparesis following nontraumatic intracerebral hemorrhage affecting left non-dominant side (HCC)  Other symptoms and signs involving the nervous system     Problem List Patient Active Problem List   Diagnosis Date Noted  . Exudative age-related macular degeneration of left eye with active choroidal neovascularization ([place]) 10/11/2019  . Intermediate stage nonexudative age-related macular degeneration of both eyes 10/11/2019  . Operculated retinal tear of left eye 10/11/2019  . Right posterior capsular opacification 10/11/2019  . Posterior vitreous detachment of left eye 10/11/2019  . Paving stone retinal degeneration of right eye 10/11/2019  . Calculus of kidney 03/12/2018  . Bleeding internal hemorrhoids 10/09/2017  . Sleep apnea 09/22/2017  . History of adenomatous polyp of colon 09/18/2017  . Prostate cancer ([place]) 09/18/2017  . Essential hypertension 08/26/2017  . Hyperlipidemia 08/26/2017  . Cerebral venous thrombosis 07/13/2017  . Intracerebral hemorrhage 07/10/2017  . ICH (intracerebral hemorrhage) ([place]) 07/10/2017  . Unstable angina ([place]) 03/12/2013    Debbe Odea, PT,DPT 04/20/2020, 9:47 AM  [facility] Physical Therapy [address] [place], [place], [address] Phone: [phone]   Fax:  [phone]  Name: Justin Adams MRN: [id number] Date of Birth: March 27, 1946

## 2020-04-21 ENCOUNTER — Ambulatory Visit (INDEPENDENT_AMBULATORY_CARE_PROVIDER_SITE_OTHER): Payer: Medicare Other | Admitting: Physical Therapy

## 2020-04-21 ENCOUNTER — Encounter: Payer: Self-pay | Admitting: Physical Therapy

## 2020-04-21 DIAGNOSIS — G8929 Other chronic pain: Secondary | ICD-10-CM

## 2020-04-21 DIAGNOSIS — M545 Low back pain, unspecified: Secondary | ICD-10-CM | POA: Diagnosis not present

## 2020-04-21 DIAGNOSIS — I69154 Hemiplegia and hemiparesis following nontraumatic intracerebral hemorrhage affecting left non-dominant side: Secondary | ICD-10-CM

## 2020-04-21 DIAGNOSIS — R29818 Other symptoms and signs involving the nervous system: Secondary | ICD-10-CM | POA: Diagnosis not present

## 2020-04-21 DIAGNOSIS — M6281 Muscle weakness (generalized): Secondary | ICD-10-CM

## 2020-04-21 DIAGNOSIS — R2681 Unsteadiness on feet: Secondary | ICD-10-CM

## 2020-04-21 NOTE — Patient Instructions (Signed)
Access Code: [email or web address] URL: [email or web address] Date: 04/21/2020 Prepared by: Elsie Ra  Exercises Bridge with Heels on [employer] - 1 x daily - 2-3 x weekly - 1-2 sets - 10-15 reps Supine Lower Trunk Rotation with Swiss Ball - 1 x daily - 2-3 x weekly - 1 sets - 5-10 reps Supine Knees to Chest with Swiss Ball - 1 x daily - 2-3 x weekly - 1 sets - 10 reps - 10 sec hold Abdominal Press into [place] and Roll - 1 x daily - 2-3 x weekly - 1 sets - 15 reps - 5 sec hold

## 2020-04-21 NOTE — Therapy (Signed)
Justin Adams, [place], [address] Phone: [phone]   Fax:  [phone]  Physical Therapy Treatment  Patient Details  Name: Justin Adams MRN: [id number] Date of Birth: 1945/10/05 Referring Provider (PT): Stoneking MD   Encounter Date: 04/21/2020   PT End of Session - 04/21/20 1110    Visit Number 4    Number of Visits 18    Date for PT Re-Evaluation 06/15/20    Authorization Type MCR, needs KX    PT Start Time 1015    PT Stop Time 1100    PT Time Calculation (min) 45 min    Activity Tolerance Patient tolerated treatment well    Behavior During Therapy [facility] for tasks assessed/performed           Past Medical History:  Diagnosis Date   Adenomatous polyp    Bleeding internal hemorrhoids 10/2017   Cerebral venous thrombosis    Grade I diastolic dysfunction 32/99/2426   noted on ECHO    Hemorrhoids    History of kidney stones    Hypercholesteremia    Hypertension    Intracranial hemorrhage ([place])    Right temporal lobe   LVH (left ventricular hypertrophy) 07/11/2017   Mild, noted on ECHO    Muscular degeneration    Nephrolithiasis    Obesity    OSA (obstructive sleep apnea)    Polycythemia    Prostate cancer ([place]) 2003   Shingles    Stroke ([place]) 06/2017   No residual   Unsteady gait    Ureteral calculi 02/12/2008   Left    Past Surgical History:  Procedure Laterality Date   APPENDECTOMY     childhood   CHOLECYSTECTOMY     COLONOSCOPY     HEMORRHOID BANDING  2015   HOLMIUM LASER APPLICATION Left 83/09/1960   Procedure: HOLMIUM LASER APPLICATION;  Surgeon: Franchot Gallo, MD;  Location: [facility];  Service: Urology;  Laterality: Left;   IR ANGIO EXTERNAL CAROTID SEL EXT CAROTID UNI R MOD SED  07/10/2017   IR ANGIO INTRA EXTRACRAN SEL INTERNAL CAROTID BILAT MOD SED  07/10/2017   IR ANGIO VERTEBRAL SEL VERTEBRAL BILAT MOD SED  07/10/2017   IR URETERAL STENT LEFT NEW  ACCESS W/O SEP NEPHROSTOMY CATH  03/12/2018   LEFT HEART CATHETERIZATION WITH CORONARY ANGIOGRAM N/A 03/15/2013   Procedure: LEFT HEART CATHETERIZATION WITH CORONARY ANGIOGRAM;  Surgeon: Minus Breeding, MD;  Location: [facility] CATH LAB;  Service: Cardiovascular;  Laterality: N/A;   NEPHROLITHOTOMY Left 03/12/2018   Procedure: LEFT NEPHROLITHOTOMY PERCUTANEOUS, STENT PLACEMENT;  Surgeon: Franchot Gallo, MD;  Location: [facility];  Service: Urology;  Laterality: Left;   PROSTATECTOMY     STONE EXTRACTION WITH BASKET      There were no vitals filed for this visit.   Subjective Assessment - 04/21/20 1053    Subjective He relays he would like to know more core exercises to do with his pball he purchased. Pain is doing okay today    Limitations Lifting;House hold activities    Patient Stated Goals reduce pain, improve balance    Pain Onset More than a month ago              [facility] PT Assessment - 04/21/20 0001      AROM   Lumbar Flexion 75%    Lumbar Extension 20%    Lumbar - Right Side Bend 60%    Lumbar - Left Side Bend 60%    Lumbar - Right Rotation 60%  Lumbar - Left Rotation 60%                         [facility] Adult PT Treatment/Exercise - 04/21/20 0001      Neuro Re-ed    Neuro Re-ed Details  tandem walking      Lumbar Exercises: Stretches   Double Knee to Chest Stretch Limitations 10 reps 10 seconds with feet on pball    Lower Trunk Rotation Limitations 10 reps 5 sec with feet on pball    Other Lumbar Stretch Exercise seated pball roll outs 10 sec X 10 for lumbar flexion       Lumbar Exercises: Aerobic   Recumbent Bike L5 X 8 min      Lumbar Exercises: Seated   Sit to Stand Limitations 2X10 reps from chair with airex pad, no UE support      Lumbar Exercises: Supine   AB Set Limitations with pball pushing arms down into ball 5 sec X 15 reps    Bent Knee Raise 5 seconds;10 reps    Bent Knee Raise Limitations pushing into pball    Bridge 2 seconds     Bridge Limitations 2 sets of 10 with feet on pball                  PT Education - 04/21/20 1109    Education Details HEP addition to include pball exercises    Person(s) Educated Patient    Methods Explanation;Demonstration;Verbal cues;Handout    Comprehension Verbalized understanding;Returned demonstration            PT Short Term Goals - 04/21/20 1115      PT SHORT TERM GOAL #1   Title Pt will report performing walking program, HEP and [employer] 2-3 days/week    Time 4    Period Weeks    Status On-going    Target Date 04/20/20      PT SHORT TERM GOAL #2   Title Pt will improve functional LE strength decreasing five time sit to stand to </= 15 seconds    Baseline 19    Time 4    Period Weeks    Status On-going             PT Long Term Goals - 03/23/20 1127      PT LONG TERM GOAL #1   Title Pt will improve 6 minute walk test by 160 ft(one extra lap) to show improved endurance and gait speed.    Time 12    Period Weeks    Status New    Target Date 06/15/20      PT LONG TERM GOAL #2   Title Pt will increaese overall leg strength to at least 4+ to 5- overall to improve function    Baseline 4 to 4+    Time 12    Period Weeks    Status New      PT LONG TERM GOAL #3   Title Pt will improve 5 times sit to stand test to less than 13 seconds    Baseline 19    Time 12    Period Weeks    Status New                 Plan - 04/21/20 1112    Clinical Impression Statement Added more core strengthening and stretching today with pball and print out of this was given to him. He showed good understanding and return demo  with this and had no complaints of pain with these. Lumbar ROM measurments improved today. He will continue to benefit from skilled PT to address his deficits.    Personal Factors and Comorbidities Comorbidity 3+    Comorbidities CVA, sleep apnea, chronic LBP, history of falls    Examination-Activity Limitations Carry;Bend;Bed  Mobility;Dressing;Lift;Stand;Stairs;Squat;Sleep;Locomotion Level    Examination-Participation Restrictions Cleaning;Community Activity;Driving;Laundry;Yard Work;Volunteer    Stability/Clinical Decision Making Evolving/Moderate complexity    Rehab Potential Good    PT Frequency 2x / week   1-2   PT Duration 12 weeks    PT Treatment/Interventions ADLs/Self Care Home Management;Cryotherapy;Electrical Stimulation;Moist Heat;Traction;Gait Scientist, forensic;Therapeutic activities;Therapeutic exercise;Balance training;Neuromuscular re-education;Manual techniques;Passive range of motion;Dry needling;Joint Manipulations;Spinal Manipulations;Taping    PT Next Visit Plan balance, leg strength, endurance, pball core    PT Home Exercise Plan tandem balance, SLS, squats, sit to stands    Consulted and Agree with Plan of Care Patient           Patient will benefit from skilled therapeutic intervention in order to improve the following deficits and impairments:  Decreased activity tolerance, Decreased balance, Decreased endurance, Decreased mobility, Decreased range of motion, Decreased strength, Difficulty walking, Increased muscle spasms, Impaired flexibility, Postural dysfunction, Improper body mechanics, Pain  Visit Diagnosis: Unsteadiness on feet  Muscle weakness (generalized)  Chronic bilateral low back pain without sciatica  Hemiplegia and hemiparesis following nontraumatic intracerebral hemorrhage affecting left non-dominant side (HCC)  Other symptoms and signs involving the nervous system     Problem List Patient Active Problem List   Diagnosis Date Noted   Exudative age-related macular degeneration of left eye with active choroidal neovascularization ([place]) 10/11/2019   Intermediate stage nonexudative age-related macular degeneration of both eyes 10/11/2019   Operculated retinal tear of left eye 10/11/2019   Right posterior capsular opacification 10/11/2019   Posterior  vitreous detachment of left eye 10/11/2019   Paving stone retinal degeneration of right eye 10/11/2019   Calculus of kidney 03/12/2018   Bleeding internal hemorrhoids 10/09/2017   Sleep apnea 09/22/2017   History of adenomatous polyp of colon 09/18/2017   Prostate cancer ([place]) 09/18/2017   Essential hypertension 08/26/2017   Hyperlipidemia 08/26/2017   Cerebral venous thrombosis 07/13/2017   Intracerebral hemorrhage 07/10/2017   ICH (intracerebral hemorrhage) ([place]) 07/10/2017   Unstable angina ([place]) 03/12/2013    Silvestre Mesi 04/21/2020, 11:15 AM  [facility] Physical Therapy [address] [place], [place], [address] Phone: [phone]   Fax:  [phone]  Name: Justin Adams MRN: [id number] Date of Birth: 1945-11-11

## 2020-04-27 ENCOUNTER — Other Ambulatory Visit: Payer: Self-pay

## 2020-04-27 ENCOUNTER — Ambulatory Visit (INDEPENDENT_AMBULATORY_CARE_PROVIDER_SITE_OTHER): Payer: Medicare Other | Admitting: Physical Therapy

## 2020-04-27 DIAGNOSIS — I69154 Hemiplegia and hemiparesis following nontraumatic intracerebral hemorrhage affecting left non-dominant side: Secondary | ICD-10-CM | POA: Diagnosis not present

## 2020-04-27 DIAGNOSIS — G8929 Other chronic pain: Secondary | ICD-10-CM | POA: Diagnosis not present

## 2020-04-27 DIAGNOSIS — M545 Low back pain, unspecified: Secondary | ICD-10-CM | POA: Diagnosis not present

## 2020-04-27 DIAGNOSIS — M6281 Muscle weakness (generalized): Secondary | ICD-10-CM | POA: Diagnosis not present

## 2020-04-27 DIAGNOSIS — R2681 Unsteadiness on feet: Secondary | ICD-10-CM

## 2020-04-27 NOTE — Therapy (Signed)
[facility] Physical Therapy [address] [place], [place], [address] Phone: [phone]   Fax:  [phone]  Physical Therapy Treatment  Patient Details  Name: Justin Adams MRN: [id number] Date of Birth: 02/06/1946 Referring Provider (PT): Stoneking MD   Encounter Date: 04/27/2020   PT End of Session - 04/27/20 0844    Visit Number 5    Number of Visits 18    Date for PT Re-Evaluation 06/15/20    Authorization Type MCR, needs KX    PT Start Time 0802    PT Stop Time 0846    PT Time Calculation (min) 44 min    Activity Tolerance Patient tolerated treatment well    Behavior During Therapy [facility] for tasks assessed/performed           Past Medical History:  Diagnosis Date  . Adenomatous polyp   . Bleeding internal hemorrhoids 10/2017  . Cerebral venous thrombosis   . Grade I diastolic dysfunction 93/79/0240   noted on ECHO   . Hemorrhoids   . History of kidney stones   . Hypercholesteremia   . Hypertension   . Intracranial hemorrhage (HCC)    Right temporal lobe  . LVH (left ventricular hypertrophy) 07/11/2017   Mild, noted on ECHO   . Muscular degeneration   . Nephrolithiasis   . Obesity   . OSA (obstructive sleep apnea)   . Polycythemia   . Prostate cancer ([place]) 2003  . Shingles   . Stroke ([place]) 06/2017   No residual  . Unsteady gait   . Ureteral calculi 02/12/2008   Left    Past Surgical History:  Procedure Laterality Date  . APPENDECTOMY     childhood  . CHOLECYSTECTOMY    . COLONOSCOPY    . HEMORRHOID BANDING  2015  . HOLMIUM LASER APPLICATION Left 97/08/5327   Procedure: HOLMIUM LASER APPLICATION;  Surgeon: Franchot Gallo, MD;  [facility];  Service: Urology;  Laterality: Left;  . IR ANGIO EXTERNAL CAROTID SEL EXT CAROTID UNI R MOD SED  07/10/2017  . IR ANGIO INTRA EXTRACRAN SEL INTERNAL CAROTID BILAT MOD SED  07/10/2017  . IR ANGIO VERTEBRAL SEL VERTEBRAL BILAT MOD SED  07/10/2017  . IR URETERAL STENT LEFT NEW  ACCESS W/O SEP NEPHROSTOMY CATH  03/12/2018  . LEFT HEART CATHETERIZATION WITH CORONARY ANGIOGRAM N/A 03/15/2013   Procedure: LEFT HEART CATHETERIZATION WITH CORONARY ANGIOGRAM;  Surgeon: Minus Breeding, MD;  Location: [facility] CATH LAB;  Service: Cardiovascular;  Laterality: N/A;  . NEPHROLITHOTOMY Left 03/12/2018   Procedure: LEFT NEPHROLITHOTOMY PERCUTANEOUS, STENT PLACEMENT;  Surgeon: Franchot Gallo, MD;  Location: [facility];  Service: Urology;  Laterality: Left;  . PROSTATECTOMY    . STONE EXTRACTION WITH BASKET      There were no vitals filed for this visit.   Subjective Assessment - 04/27/20 0835    Subjective he relays overall his pain levels are mild, he did start going back to the gym this week.    Limitations Lifting;House hold activities    Patient Stated Goals reduce pain, improve balance    Pain Onset More than a month ago             [facility] Adult PT Treatment/Exercise - 04/27/20 0001      Neuro Re-ed    Neuro Re-ed Details  tandem walk, march walk, SLS      Lumbar Exercises: Stretches   Double Knee to Chest Stretch Limitations 10 reps 10 seconds with feet on pball    Lower Trunk  Rotation Limitations 10 reps 5 sec with feet on pball    Other Lumbar Stretch Exercise seated pball roll outs 10 sec X 10 for lumbar flexion       Lumbar Exercises: Aerobic   Recumbent Bike L3X10 min      Lumbar Exercises: Standing   Other Standing Lumbar Exercises TRX rows and squats 2 sets of 15 ea      Lumbar Exercises: Seated   Sit to Stand Limitations 2 sets of 5, no UE support, from chair with airex pad       Lumbar Exercises: Supine   AB Set Limitations with pball pushing arms down into ball 5 sec X 15 reps    Bridge 5 seconds    Bridge Limitations 2 sets of 10 with feet on pball                    PT Short Term Goals - 04/21/20 1115      PT SHORT TERM GOAL #1   Title Pt will report performing walking program, HEP and [employer] 2-3 days/week    Time 4    Period  Weeks    Status On-going    Target Date 04/20/20      PT SHORT TERM GOAL #2   Title Pt will improve functional LE strength decreasing five time sit to stand to </= 15 seconds    Baseline 19    Time 4    Period Weeks    Status On-going             PT Long Term Goals - 03/23/20 1127      PT LONG TERM GOAL #1   Title Pt will improve 6 minute walk test by 160 ft(one extra lap) to show improved endurance and gait speed.    Time 12    Period Weeks    Status New    Target Date 06/15/20      PT LONG TERM GOAL #2   Title Pt will increaese overall leg strength to at least 4+ to 5- overall to improve function    Baseline 4 to 4+    Time 12    Period Weeks    Status New      PT LONG TERM GOAL #3   Title Pt will improve 5 times sit to stand test to less than 13 seconds    Baseline 19    Time 12    Period Weeks    Status New                 Plan - 04/27/20 0844    Clinical Impression Statement Session focused on stretching, core strength, general strength, and overall balance as tolerated, he continues to have deficits but overall slowly improving since returning back to PT.    Personal Factors and Comorbidities Comorbidity 3+    Comorbidities CVA, sleep apnea, chronic LBP, history of falls    Examination-Activity Limitations Carry;Bend;Bed Mobility;Dressing;Lift;Stand;Stairs;Squat;Sleep;Locomotion Level    Examination-Participation Restrictions Cleaning;Community Activity;Driving;Laundry;Yard Work;Volunteer    Stability/Clinical Decision Making Evolving/Moderate complexity    Rehab Potential Good    PT Frequency 2x / week   1-2   PT Duration 12 weeks    PT Treatment/Interventions ADLs/Self Care Home Management;Cryotherapy;Electrical Stimulation;Moist Heat;Traction;Gait Scientist, forensic;Therapeutic activities;Therapeutic exercise;Balance training;Neuromuscular re-education;Manual techniques;Passive range of motion;Dry needling;Joint Manipulations;Spinal  Manipulations;Taping    PT Next Visit Plan balance, leg strength, endurance, pball core    PT Home Exercise Plan tandem balance, SLS, squats, sit  to stands    Consulted and Agree with Plan of Care Patient           Patient will benefit from skilled therapeutic intervention in order to improve the following deficits and impairments:  Decreased activity tolerance, Decreased balance, Decreased endurance, Decreased mobility, Decreased range of motion, Decreased strength, Difficulty walking, Increased muscle spasms, Impaired flexibility, Postural dysfunction, Improper body mechanics, Pain  Visit Diagnosis: Unsteadiness on feet  Muscle weakness (generalized)  Chronic bilateral low back pain without sciatica  Hemiplegia and hemiparesis following nontraumatic intracerebral hemorrhage affecting left non-dominant side [facility])     Problem List Patient Active Problem List   Diagnosis Date Noted  . Exudative age-related macular degeneration of left eye with active choroidal neovascularization ([place]) 10/11/2019  . Intermediate stage nonexudative age-related macular degeneration of both eyes 10/11/2019  . Operculated retinal tear of left eye 10/11/2019  . Right posterior capsular opacification 10/11/2019  . Posterior vitreous detachment of left eye 10/11/2019  . Paving stone retinal degeneration of right eye 10/11/2019  . Calculus of kidney 03/12/2018  . Bleeding internal hemorrhoids 10/09/2017  . Sleep apnea 09/22/2017  . History of adenomatous polyp of colon 09/18/2017  . Prostate cancer ([place]) 09/18/2017  . Essential hypertension 08/26/2017  . Hyperlipidemia 08/26/2017  . Cerebral venous thrombosis 07/13/2017  . Intracerebral hemorrhage 07/10/2017  . ICH (intracerebral hemorrhage) ([place]) 07/10/2017  . Unstable angina [facility]) 03/12/2013    Silvestre Mesi 04/27/2020, 8:47 AM  [facility] Physical Therapy [address] [place], [place], [address] Phone:  [phone]   Fax:  [phone]  Name: Justin Adams MRN: [id number] Date of Birth: 06-17-1945

## 2020-04-29 DIAGNOSIS — E7800 Pure hypercholesterolemia, unspecified: Secondary | ICD-10-CM | POA: Diagnosis not present

## 2020-04-29 DIAGNOSIS — I10 Essential (primary) hypertension: Secondary | ICD-10-CM | POA: Diagnosis not present

## 2020-05-08 ENCOUNTER — Encounter (INDEPENDENT_AMBULATORY_CARE_PROVIDER_SITE_OTHER): Payer: Self-pay | Admitting: Ophthalmology

## 2020-05-08 ENCOUNTER — Other Ambulatory Visit: Payer: Self-pay

## 2020-05-08 ENCOUNTER — Ambulatory Visit (INDEPENDENT_AMBULATORY_CARE_PROVIDER_SITE_OTHER): Payer: Medicare Other | Admitting: Ophthalmology

## 2020-05-08 DIAGNOSIS — H353112 Nonexudative age-related macular degeneration, right eye, intermediate dry stage: Secondary | ICD-10-CM

## 2020-05-08 DIAGNOSIS — H353221 Exudative age-related macular degeneration, left eye, with active choroidal neovascularization: Secondary | ICD-10-CM

## 2020-05-08 MED ORDER — BEVACIZUMAB CHEMO INJECTION 1.25MG/0.05ML SYRINGE FOR KALEIDOSCOPE
1.2500 mg | INTRAVITREAL | Status: AC | PRN
  Administered 2020-05-08: 1.25 mg via INTRAVITREAL

## 2020-05-08 NOTE — Assessment & Plan Note (Signed)

## 2020-05-08 NOTE — Assessment & Plan Note (Signed)
Repeat injection intravitreal Avastin OS today and examination in 7 to 8 weeks

## 2020-05-08 NOTE — Progress Notes (Signed)
05/08/2020     CHIEF COMPLAINT Patient presents for Retina Follow Up   HISTORY OF PRESENT ILLNESS: Justin Adams is a 74 y.o. male who presents to the clinic today for:   HPI    Retina Follow Up    Patient presents with  Wet AMD.  In left eye.  This started 9 weeks ago.  Severity is mild.  Duration of 9 weeks.  Since onset it is stable.          Comments    9 Week AMD F/U OS, poss Avastin OS  Pt denies noticeable changes to [place] OU since last visit. Pt denies ocular pain or floaters OU. Pt reports intermittent flash of light, especially when eyes closed OU.        Last edited by Rockie Neighbours, [place] on 05/08/2020  8:24 AM. (History)      Referring physician: [name], MD [address] [place],  [place] [address]  HISTORICAL INFORMATION:   Selected notes from the MEDICAL RECORD NUMBER    Lab Results  Component Value Date   HGBA1C 5.3 07/11/2017     CURRENT MEDICATIONS: No current outpatient medications on file. (Ophthalmic Drugs)   No current facility-administered medications for this visit. (Ophthalmic Drugs)   Current Outpatient Medications (Other)  Medication Sig  . acetaminophen (TYLENOL) 500 MG tablet Take 500-1,000 mg by mouth daily as needed for moderate pain or headache.  Marland Kitchen amLODipine (NORVASC) 2.5 MG tablet Take 1 tablet (2.5 mg total) by mouth daily as needed (if systolic bp is over 387).  Marland Kitchen apixaban (ELIQUIS) 5 MG TABS tablet Take 1 tablet (5 mg total) by mouth 2 (two) times daily.  Marland Kitchen atorvastatin (LIPITOR) 20 MG tablet Take 1 tablet (20 mg total) by mouth daily.  Marland Kitchen lisinopril (PRINIVIL,ZESTRIL) 20 MG tablet Take 1 tablet (20 mg total) by mouth daily.  . Melatonin 3 MG TABS Take 3 mg by mouth at bedtime as needed (sleep).  . polyethylene glycol (MIRALAX / GLYCOLAX) packet Take 17 g by mouth at bedtime.  . Wheat Dextrin (BENEFIBER) POWD Take 1 Dose by mouth daily. (Patient not taking: Reported on 12/10/2019)   No current  facility-administered medications for this visit. (Other)      REVIEW OF SYSTEMS:    ALLERGIES No Known Allergies  PAST MEDICAL HISTORY Past Medical History:  Diagnosis Date  . Adenomatous polyp   . Bleeding internal hemorrhoids 10/2017  . Cerebral venous thrombosis   . Grade I diastolic dysfunction 56/43/3295   noted on ECHO   . Hemorrhoids   . History of kidney stones   . Hypercholesteremia   . Hypertension   . Intracranial hemorrhage (HCC)    Right temporal lobe  . LVH (left ventricular hypertrophy) 07/11/2017   Mild, noted on ECHO   . Muscular degeneration   . Nephrolithiasis   . Obesity   . OSA (obstructive sleep apnea)   . Polycythemia   . Prostate cancer ([place]) 2003  . Shingles   . Stroke ([place]) 06/2017   No residual  . Unsteady gait   . Ureteral calculi 02/12/2008   Left   Past Surgical History:  Procedure Laterality Date  . APPENDECTOMY     childhood  . CHOLECYSTECTOMY    . COLONOSCOPY    . HEMORRHOID BANDING  2015  . HOLMIUM LASER APPLICATION Left 18/01/4165   Procedure: HOLMIUM LASER APPLICATION;  Surgeon: Franchot Gallo, MD;  Location: [facility];  Service: Urology;  Laterality: Left;  .  IR ANGIO EXTERNAL CAROTID SEL EXT CAROTID UNI R MOD SED  07/10/2017  . IR ANGIO INTRA EXTRACRAN SEL INTERNAL CAROTID BILAT MOD SED  07/10/2017  . IR ANGIO VERTEBRAL SEL VERTEBRAL BILAT MOD SED  07/10/2017  . IR URETERAL STENT LEFT NEW ACCESS W/O SEP NEPHROSTOMY CATH  03/12/2018  . LEFT HEART CATHETERIZATION WITH CORONARY ANGIOGRAM N/A 03/15/2013   Procedure: LEFT HEART CATHETERIZATION WITH CORONARY ANGIOGRAM;  Surgeon: Minus Breeding, MD;  Location: [facility] CATH LAB;  Service: Cardiovascular;  Laterality: N/A;  . NEPHROLITHOTOMY Left 03/12/2018   Procedure: LEFT NEPHROLITHOTOMY PERCUTANEOUS, STENT PLACEMENT;  Surgeon: Franchot Gallo, MD;  Location: [facility];  Service: Urology;  Laterality: Left;  . PROSTATECTOMY    . STONE EXTRACTION WITH BASKET      FAMILY  HISTORY Family History  Problem Relation Age of Onset  . Hypertension Mother   . Coronary artery disease Mother 64  . Breast cancer Mother   . Coronary artery disease Father 65       Died age 71  . CAD Brother 80    SOCIAL HISTORY Social History   Tobacco Use  . Smoking status: Never Smoker  . Smokeless tobacco: Never Used  Vaping Use  . Vaping Use: Never used  Substance Use Topics  . Alcohol use: No  . Drug use: No         OPHTHALMIC EXAM:  Base Eye Exam    Visual Acuity (ETDRS)      Right Left   Dist cc 20/25 -1 E card @ 4'   Dist ph cc  NI   Correction: Glasses       Tonometry (Tonopen, 8:24 AM)      Right Left   Pressure 10 14       Pupils      Pupils Dark Light Shape React APD   Right PERRL 4 3 Round Brisk None   Left PERRL 4 3 Round Brisk None       Visual Fields (Counting fingers)      Left Right    Full Full       Extraocular Movement      Right Left    Full Full       Neuro/Psych    Oriented x3: Yes   Mood/Affect: Normal       Dilation    Left eye: 1.0% Mydriacyl, 2.5% Phenylephrine @ 8:26 AM        Slit Lamp and Fundus Exam    External Exam      Right Left   External Normal Normal       Slit Lamp Exam      Right Left   Lids/Lashes Normal Normal   Conjunctiva/Sclera White and quiet White and quiet   Cornea Clear Clear   Anterior Chamber Deep and quiet Deep and quiet   Iris Round and reactive Round and reactive   Lens Posterior chamber intraocular lens Posterior chamber intraocular lens   Anterior Vitreous Normal Normal       Fundus Exam      Right Left   Posterior Vitreous  Posterior vitreous detachment, Central vitreous floaters   Disc  Normal   C/D Ratio  0.45   Macula  Macular thickening, Subretinal neovascular membrane nasal, Retinal pigment epithelial mottling, Subretinal fibrosis, Hard drusen, Disciform scar, centrally with subretinal hemorrhage peripheral scarring   Vessels  Normal   Periphery  Normal,  attached, 25 d, 90 d  IMAGING AND PROCEDURES  Imaging and Procedures for 05/08/20  OCT, Retina - OU - Both Eyes       Right Eye Quality was good. Scan locations included subfoveal. Central Foveal Thickness: 288. Progression has been stable. Findings include retinal drusen , normal foveal contour, no IRF, no SRF.   Left Eye Central Foveal Thickness: 285. Progression has worsened. Findings include abnormal foveal contour, intraretinal fluid.   Notes Chronic edge of CNVM, subfoveal disciform scar activity nasally OS, will repeat injection today at 9-week interval and follow-up again in 7 to 8 weeks       Intravitreal Injection, Pharmacologic Agent - OS - Left Eye       Time Out 05/08/2020. 8:56 AM. Confirmed correct patient, procedure, site, and patient consented.   Anesthesia Topical anesthesia was used. Anesthetic medications included Akten 3.5%.   Procedure Preparation included Ofloxacin , Tobramycin 0.3%, 5% betadine to ocular surface. A 30 gauge needle was used.   Injection:  1.25 mg Bevacizumab (AVASTIN) SOLN   NDC: 70360-001-02   Route: Intravitreal, Site: Left Eye, Waste: 0 mg  Post-op Post injection exam found visual acuity of at least counting fingers. The patient tolerated the procedure well. There were no complications. The patient received written and verbal post procedure care education. Post injection medications were not given.                 ASSESSMENT/PLAN:  Exudative age-related macular degeneration of left eye with active choroidal neovascularization (HCC) Repeat injection intravitreal Avastin OS today and examination in 7 to 8 weeks  Intermediate stage nonexudative age-related macular degeneration of right eye The nature of age--related macular degeneration was discussed with the patient as well as the distinction between dry and wet types. Checking an Amsler Grid daily with advice to return immediately should a distortion develop,  was given to the patient. The patient 's smoking status now and in the past was determined and advice based on the AREDS study was provided regarding the consumption of antioxidant supplements. AREDS 2 vitamin formulation was recommended. Consumption of dark leafy vegetables and fresh fruits of various colors was recommended. Treatment modalities for wet macular degeneration particularly the use of intravitreal injections of anti-blood vessel growth factors was discussed with the patient. Avastin, Lucentis, and Eylea are the available options. On occasion, therapy includes the use of photodynamic therapy and thermal laser. Stressed to the patient do not rub eyes.  Patient was advised to check Amsler Grid daily and return immediately if changes are noted. Instructions on using the grid were given to the patient. All patient questions were answered.      ICD-10-CM   1. Exudative age-related macular degeneration of left eye with active choroidal neovascularization (HCC)  H35.3221 OCT, Retina - OU - Both Eyes    Intravitreal Injection, Pharmacologic Agent - OS - Left Eye    Bevacizumab (AVASTIN) SOLN 1.25 mg  2. Intermediate stage nonexudative age-related macular degeneration of right eye  H35.3112     1.  No signs of active CNVM OD  2.  Active subfoveal disciform left eye, at 9-week interval today, edge activity nasally with subretinal hemorrhage clinically as well as intraretinal fluid on OCT, repeat Avastin today and follow-up repeat examination left eye in 8 weeks  3.  Ophthalmic Meds Ordered this visit:  Meds ordered this encounter  Medications  . Bevacizumab (AVASTIN) SOLN 1.25 mg       Return in about 8 weeks (around 07/03/2020) for dilate, OS, AVASTIN OCT.  There are no Patient Instructions on file for this visit.   Explained the diagnoses, plan, and follow up with the patient and they expressed understanding.  Patient expressed understanding of the importance of proper follow up  care.   Clent Demark Mattis Featherly M.D. Diseases & Surgery of the Retina and Vitreous [facility] [place] 05/08/20     Abbreviations: M myopia (nearsighted); A astigmatism; H hyperopia (farsighted); P presbyopia; Mrx spectacle prescription;  CTL contact lenses; OD right eye; OS left eye; OU both eyes  XT exotropia; ET esotropia; PEK punctate epithelial keratitis; PEE punctate epithelial erosions; DES dry eye syndrome; MGD meibomian gland dysfunction; ATs artificial tears; PFAT's preservative free artificial tears; [place] nuclear sclerotic cataract; PSC posterior subcapsular cataract; ERM epi-retinal membrane; PVD posterior vitreous detachment; RD retinal detachment; DM diabetes mellitus; DR diabetic retinopathy; NPDR non-proliferative diabetic retinopathy; PDR proliferative diabetic retinopathy; CSME clinically significant macular edema; DME diabetic macular edema; dbh dot blot hemorrhages; CWS cotton wool spot; POAG primary open angle glaucoma; C/D cup-to-disc ratio; HVF humphrey visual field; GVF goldmann visual field; OCT optical coherence tomography; IOP intraocular pressure; BRVO Branch retinal vein occlusion; CRVO central retinal vein occlusion; CRAO central retinal artery occlusion; BRAO branch retinal artery occlusion; RT retinal tear; SB scleral buckle; PPV pars plana vitrectomy; VH Vitreous hemorrhage; PRP panretinal laser photocoagulation; IVK intravitreal kenalog; VMT vitreomacular traction; MH Macular hole;  NVD neovascularization of the disc; NVE neovascularization elsewhere; AREDS age related eye disease study; ARMD age related macular degeneration; POAG primary open angle glaucoma; EBMD epithelial/anterior basement membrane dystrophy; ACIOL anterior chamber intraocular lens; IOL intraocular lens; PCIOL posterior chamber intraocular lens; Phaco/IOL phacoemulsification with intraocular lens placement; [place] photorefractive keratectomy; LASIK laser assisted in situ keratomileusis; HTN hypertension;  DM diabetes mellitus; COPD chronic obstructive pulmonary disease

## 2020-05-09 ENCOUNTER — Encounter: Payer: Self-pay | Admitting: Physical Therapy

## 2020-05-09 ENCOUNTER — Ambulatory Visit (INDEPENDENT_AMBULATORY_CARE_PROVIDER_SITE_OTHER): Payer: Medicare Other | Admitting: Physical Therapy

## 2020-05-09 DIAGNOSIS — M545 Low back pain, unspecified: Secondary | ICD-10-CM

## 2020-05-09 DIAGNOSIS — M6281 Muscle weakness (generalized): Secondary | ICD-10-CM | POA: Diagnosis not present

## 2020-05-09 DIAGNOSIS — G8929 Other chronic pain: Secondary | ICD-10-CM | POA: Diagnosis not present

## 2020-05-09 DIAGNOSIS — R2681 Unsteadiness on feet: Secondary | ICD-10-CM

## 2020-05-09 DIAGNOSIS — R29818 Other symptoms and signs involving the nervous system: Secondary | ICD-10-CM | POA: Diagnosis not present

## 2020-05-09 DIAGNOSIS — I69154 Hemiplegia and hemiparesis following nontraumatic intracerebral hemorrhage affecting left non-dominant side: Secondary | ICD-10-CM | POA: Diagnosis not present

## 2020-05-09 NOTE — Therapy (Signed)
[facility] [place], [place], [address] Phone: [phone]   Fax:  [phone]  Physical Therapy Treatment  Patient Details  Name: Justin Adams MRN: [id number] Date of Birth: 04-17-1946 Referring Provider (PT): [place] MD   Encounter Date: 05/09/2020   PT End of Session - 05/09/20 0817    Visit Number 6    Number of Visits 18    Date for PT Re-Evaluation 06/15/20    Authorization Type MCR, needs KX    PT Start Time 0800    PT Stop Time 0845    PT Time Calculation (min) 45 min    Activity Tolerance Patient tolerated treatment well    Behavior During Therapy [facility] for tasks assessed/performed           Past Medical History:  Diagnosis Date   Adenomatous polyp    Bleeding internal hemorrhoids 10/2017   Cerebral venous thrombosis    Grade I diastolic dysfunction 09/98/3382   noted on ECHO    Hemorrhoids    History of kidney stones    Hypercholesteremia    Hypertension    Intracranial hemorrhage ([place])    Right temporal lobe   LVH (left ventricular hypertrophy) 07/11/2017   Mild, noted on ECHO    Muscular degeneration    Nephrolithiasis    Obesity    OSA (obstructive sleep apnea)    Polycythemia    Prostate cancer ([place]) 2003   Shingles    Stroke ([place]) 06/2017   No residual   Unsteady gait    Ureteral calculi 02/12/2008   Left    Past Surgical History:  Procedure Laterality Date   APPENDECTOMY     childhood   CHOLECYSTECTOMY     COLONOSCOPY     HEMORRHOID BANDING  2015   HOLMIUM LASER APPLICATION Left 50/10/3974   Procedure: HOLMIUM LASER APPLICATION;  Surgeon: Franchot Gallo, MD;  Location: [facility];  Service: Urology;  Laterality: Left;   IR ANGIO EXTERNAL CAROTID SEL EXT CAROTID UNI R MOD SED  07/10/2017   IR ANGIO INTRA EXTRACRAN SEL INTERNAL CAROTID BILAT MOD SED  07/10/2017   IR ANGIO VERTEBRAL SEL VERTEBRAL BILAT MOD SED  07/10/2017   IR URETERAL STENT LEFT NEW  ACCESS W/O SEP NEPHROSTOMY CATH  03/12/2018   LEFT HEART CATHETERIZATION WITH CORONARY ANGIOGRAM N/A 03/15/2013   Procedure: LEFT HEART CATHETERIZATION WITH CORONARY ANGIOGRAM;  Surgeon: Minus Breeding, MD;  Location: [facility] CATH LAB;  Service: Cardiovascular;  Laterality: N/A;   NEPHROLITHOTOMY Left 03/12/2018   Procedure: LEFT NEPHROLITHOTOMY PERCUTANEOUS, STENT PLACEMENT;  Surgeon: Franchot Gallo, MD;  Location: [facility];  Service: Urology;  Laterality: Left;   PROSTATECTOMY     STONE EXTRACTION WITH BASKET      There were no vitals filed for this visit.   Subjective Assessment - 05/09/20 0816    Subjective no complaints of back pain today    Limitations Lifting;House hold activities    Patient Stated Goals reduce pain, improve balance    Pain Onset More than a month ago              [facility] PT Assessment - 05/09/20 0001      Strength   Right Hip Flexion 4+/5    Right Hip ABduction 4+/5    Left Hip Flexion 4+/5    Left Hip ABduction 4+/5    Right Knee Flexion 4+/5    Right Knee Extension 4+/5    Left Knee Flexion 4+/5    Left Knee Extension  4+/5            [facility] Adult PT Treatment/Exercise - 05/09/20 0001      Neuro Re-ed    Neuro Re-ed Details  tandem walk, march walk, SLS with cone taps      Lumbar Exercises: Stretches   Double Knee to Chest Stretch Limitations 10 reps 10 seconds with feet on pball    Other Lumbar Stretch Exercise seated pball roll outs 10 sec X 10 for lumbar flexion       Lumbar Exercises: Aerobic   Recumbent Bike L3X10 min      Lumbar Exercises: Standing   Other Standing Lumbar Exercises TRX rows and squats 2 sets of 15 ea      Lumbar Exercises: Seated   Sit to Stand Limitations 10 reps no UE support slightly raised mat surface     Lumbar Exercises: Supine   AB Set Limitations with pball pushing arms down into ball and knees up into ball 5 sec X 15 reps    Bridge 5 seconds    Bridge Limitations 2 sets of 10 with feet on pball                     PT Short Term Goals - 04/21/20 1115      PT SHORT TERM GOAL #1   Title Pt will report performing walking program, HEP and [employer] 2-3 days/week    Time 4    Period Weeks    Status On-going    Target Date 04/20/20      PT SHORT TERM GOAL #2   Title Pt will improve functional LE strength decreasing five time sit to stand to </= 15 seconds    Baseline 19    Time 4    Period Weeks    Status On-going             PT Long Term Goals - 05/09/20 7408      PT LONG TERM GOAL #1   Title Pt will improve 6 minute walk test by 160 ft(one extra lap) to show improved endurance and gait speed.    Time 12    Period Weeks    Status On-going      PT LONG TERM GOAL #2   Title Pt will increaese overall leg strength to at least 4+ to 5- overall to improve function    Baseline now 4+ on 11/30    Time 12    Period Weeks    Status On-going      PT LONG TERM GOAL #3   Title Pt will improve 5 times sit to stand test to less than 13 seconds    Baseline 19    Time 12    Period Weeks    Status On-going      PT LONG TERM GOAL #4   Title -      PT LONG TERM GOAL #5   Title -                 Plan - 05/09/20 0836    Clinical Impression Statement Continued to address his deficits in strength,and balance today with exercises and balance training. He did have improvments noted in overall leg strength today with MMT. Continue POC.    Personal Factors and Comorbidities Comorbidity 3+    Comorbidities CVA, sleep apnea, chronic LBP, history of falls    Examination-Activity Limitations Carry;Bend;Bed Mobility;Dressing;Lift;Stand;Stairs;Squat;Sleep;Locomotion Level    Examination-Participation Restrictions Cleaning;Community Activity;Driving;Laundry;Yard Work;Volunteer  Stability/Clinical Decision Making Evolving/Moderate complexity    Rehab Potential Good    PT Frequency 2x / week   1-2   PT Duration 12 weeks    PT Treatment/Interventions ADLs/Self Care Home  Management;Cryotherapy;Electrical Stimulation;Moist Heat;Traction;Gait Scientist, forensic;Therapeutic activities;Therapeutic exercise;Balance training;Neuromuscular re-education;Manual techniques;Passive range of motion;Dry needling;Joint Manipulations;Spinal Manipulations;Taping    PT Next Visit Plan balance, leg strength, endurance, pball core    PT Home Exercise Plan tandem balance, SLS, squats, sit to stands    Consulted and Agree with Plan of Care Patient           Patient will benefit from skilled therapeutic intervention in order to improve the following deficits and impairments:  Decreased activity tolerance, Decreased balance, Decreased endurance, Decreased mobility, Decreased range of motion, Decreased strength, Difficulty walking, Increased muscle spasms, Impaired flexibility, Postural dysfunction, Improper body mechanics, Pain  Visit Diagnosis: Unsteadiness on feet  Muscle weakness (generalized)  Chronic bilateral low back pain without sciatica  Hemiplegia and hemiparesis following nontraumatic intracerebral hemorrhage affecting left non-dominant side (HCC)  Other symptoms and signs involving the nervous system     Problem List Patient Active Problem List   Diagnosis Date Noted   Intermediate stage nonexudative age-related macular degeneration of right eye 05/08/2020   Exudative age-related macular degeneration of left eye with active choroidal neovascularization (HCC) 10/11/2019   Intermediate stage nonexudative age-related macular degeneration of both eyes 10/11/2019   Operculated retinal tear of left eye 10/11/2019   Right posterior capsular opacification 10/11/2019   Posterior vitreous detachment of left eye 10/11/2019   Paving stone retinal degeneration of right eye 10/11/2019   Calculus of kidney 03/12/2018   Bleeding internal hemorrhoids 10/09/2017   Sleep apnea 09/22/2017   History of adenomatous polyp of colon 09/18/2017   Prostate cancer  ([place]) 09/18/2017   Essential hypertension 08/26/2017   Hyperlipidemia 08/26/2017   Cerebral venous thrombosis 07/13/2017   Intracerebral hemorrhage 07/10/2017   ICH (intracerebral hemorrhage) ([place]) 07/10/2017   Unstable angina ([place]) 03/12/2013    Justin Adams 05/09/2020, 9:24 AM  [facility] [address] [place], [place], [address] Phone: [phone]   Fax:  [phone]  Name: Justin Adams MRN: [id number] Date of Birth: 1946/06/07

## 2020-05-11 ENCOUNTER — Ambulatory Visit (INDEPENDENT_AMBULATORY_CARE_PROVIDER_SITE_OTHER): Payer: Medicare Other | Admitting: Physical Therapy

## 2020-05-11 ENCOUNTER — Other Ambulatory Visit: Payer: Self-pay

## 2020-05-11 ENCOUNTER — Encounter: Payer: Self-pay | Admitting: Physical Therapy

## 2020-05-11 DIAGNOSIS — M6281 Muscle weakness (generalized): Secondary | ICD-10-CM

## 2020-05-11 DIAGNOSIS — I69154 Hemiplegia and hemiparesis following nontraumatic intracerebral hemorrhage affecting left non-dominant side: Secondary | ICD-10-CM | POA: Diagnosis not present

## 2020-05-11 DIAGNOSIS — R29818 Other symptoms and signs involving the nervous system: Secondary | ICD-10-CM | POA: Diagnosis not present

## 2020-05-11 DIAGNOSIS — G8929 Other chronic pain: Secondary | ICD-10-CM

## 2020-05-11 DIAGNOSIS — R2681 Unsteadiness on feet: Secondary | ICD-10-CM | POA: Diagnosis not present

## 2020-05-11 DIAGNOSIS — M545 Low back pain, unspecified: Secondary | ICD-10-CM | POA: Diagnosis not present

## 2020-05-11 NOTE — Therapy (Signed)
[facility] [place], [place], [address] Phone: [phone]   Fax:  [phone]  Physical Therapy Treatment  Patient Details  Name: Justin Adams MRN: [id number] Date of Birth: [birth year] Referring Provider (PT): Stoneking MD   Encounter Date: 05/11/2020   PT End of Session - 05/11/20 0842    Visit Number 7    Number of Visits 18    Date for PT Re-Evaluation 06/15/20    Authorization Type MCR, needs KX    PT Start Time 0800    PT Stop Time 0845    PT Time Calculation (min) 45 min    Activity Tolerance Patient tolerated treatment well    Behavior During Therapy [facility] for tasks assessed/performed           Past Medical History:  Diagnosis Date  . Adenomatous polyp   . Bleeding internal hemorrhoids 10/2017  . Cerebral venous thrombosis   . Grade I diastolic dysfunction 09/38/1829   noted on ECHO   . Hemorrhoids   . History of kidney stones   . Hypercholesteremia   . Hypertension   . Intracranial hemorrhage (HCC)    Right temporal lobe  . LVH (left ventricular hypertrophy) 07/11/2017   Mild, noted on ECHO   . Muscular degeneration   . Nephrolithiasis   . Obesity   . OSA (obstructive sleep apnea)   . Polycythemia   . Prostate cancer ([place]) 2003  . Shingles   . Stroke ([place]) 06/2017   No residual  . Unsteady gait   . Ureteral calculi 02/12/2008   Left    Past Surgical History:  Procedure Laterality Date  . APPENDECTOMY     childhood  . CHOLECYSTECTOMY    . COLONOSCOPY    . HEMORRHOID BANDING  2015  . HOLMIUM LASER APPLICATION Left 93/12/1694   Procedure: HOLMIUM LASER APPLICATION;  Surgeon: Franchot Gallo, MD;  Location: [facility];  Service: Urology;  Laterality: Left;  . IR ANGIO EXTERNAL CAROTID SEL EXT CAROTID UNI R MOD SED  07/10/2017  . IR ANGIO INTRA EXTRACRAN SEL INTERNAL CAROTID BILAT MOD SED  07/10/2017  . IR ANGIO VERTEBRAL SEL VERTEBRAL BILAT MOD SED  07/10/2017  . IR URETERAL STENT LEFT NEW  ACCESS W/O SEP NEPHROSTOMY CATH  03/12/2018  . LEFT HEART CATHETERIZATION WITH CORONARY ANGIOGRAM N/A 03/15/2013   Procedure: LEFT HEART CATHETERIZATION WITH CORONARY ANGIOGRAM;  Surgeon: Minus Breeding, MD;  Location: [facility] CATH LAB;  Service: Cardiovascular;  Laterality: N/A;  . NEPHROLITHOTOMY Left 03/12/2018   Procedure: LEFT NEPHROLITHOTOMY PERCUTANEOUS, STENT PLACEMENT;  Surgeon: Franchot Gallo, MD;  Location: [facility];  Service: Urology;  Laterality: Left;  . PROSTATECTOMY    . STONE EXTRACTION WITH BASKET      There were no vitals filed for this visit.   Subjective Assessment - 05/11/20 0821    Subjective relays his back bothers him first thing in the morning but then it feels better as he moves around and starts his day.    Limitations Lifting;House hold activities    Patient Stated Goals reduce pain, improve balance    Pain Onset More than a month ago              [facility] Adult PT Treatment/Exercise - 05/11/20 0001      Neuro Re-ed    Neuro Re-ed Details  tandem walk of foam, sidestepping on foam, march walk,       Lumbar Exercises: Stretches   Double Knee to Chest Stretch Limitations 10 reps  10 seconds with feet on pball    Lower Trunk Rotation Limitations 10 reps 5 sec with feet on pball    Other Lumbar Stretch Exercise seated pball roll outs 10 sec X 10 for lumbar flexion       Lumbar Exercises: Aerobic   Recumbent Bike L3X10 min      Lumbar Exercises: Machines for Strengthening   Other Lumbar Machine Exercise row machine 35 lbs 2X10 reps    Other Lumbar Machine Exercise lat pull 35 lbs 2X10 reps      Lumbar Exercises: Seated   Sit to Stand Limitations 2 sets of 10 reps on UE support, 24.5 inch      Lumbar Exercises: Supine   AB Set Limitations with pball pushing arms down into ball and knees up into ball 5 sec X 15 reps    Bridge 5 seconds    Bridge Limitations 2 sets of 10 with feet on pball                    PT Short Term Goals - 04/21/20 1115       PT SHORT TERM GOAL #1   Title Pt will report performing walking program, HEP and [employer] 2-3 days/week    Time 4    Period Weeks    Status On-going    Target Date 04/20/20      PT SHORT TERM GOAL #2   Title Pt will improve functional LE strength decreasing five time sit to stand to </= 15 seconds    Baseline 19    Time 4    Period Weeks    Status On-going             PT Long Term Goals - 05/09/20 9735      PT LONG TERM GOAL #1   Title Pt will improve 6 minute walk test by 160 ft(one extra lap) to show improved endurance and gait speed.    Time 12    Period Weeks    Status On-going      PT LONG TERM GOAL #2   Title Pt will increaese overall leg strength to at least 4+ to 5- overall to improve function    Baseline now 4+ on 11/30    Time 12    Period Weeks    Status On-going      PT LONG TERM GOAL #3   Title Pt will improve 5 times sit to stand test to less than 13 seconds    Baseline 19    Time 12    Period Weeks    Status On-going      PT LONG TERM GOAL #4   Title -      PT LONG TERM GOAL #5   Title -                 Plan - 05/11/20 0844    Clinical Impression Statement He had some soreness and pain in his shoulder that he feels may have been from too many reps on TRX so this was held today and instead used row machine/lat machine for upper body/posture strength and he showed good tolerance to this without complaints.    Personal Factors and Comorbidities Comorbidity 3+    Comorbidities CVA, sleep apnea, chronic LBP, history of falls    Examination-Activity Limitations Carry;Bend;Bed Mobility;Dressing;Lift;Stand;Stairs;Squat;Sleep;Locomotion Level    Examination-Participation Restrictions Cleaning;Community Activity;Driving;Laundry;Yard Work;Volunteer    Stability/Clinical Decision Making Evolving/Moderate complexity    Rehab Potential Good  PT Frequency 2x / week   1-2   PT Duration 12 weeks    PT Treatment/Interventions ADLs/Self Care  Home Management;Cryotherapy;Electrical Stimulation;Moist Heat;Traction;Gait Scientist, forensic;Therapeutic activities;Therapeutic exercise;Balance training;Neuromuscular re-education;Manual techniques;Passive range of motion;Dry needling;Joint Manipulations;Spinal Manipulations;Taping    PT Next Visit Plan balance, leg strength, endurance, pball core    PT Home Exercise Plan tandem balance, SLS, squats, sit to stands    Consulted and Agree with Plan of Care Patient           Patient will benefit from skilled therapeutic intervention in order to improve the following deficits and impairments:  Decreased activity tolerance, Decreased balance, Decreased endurance, Decreased mobility, Decreased range of motion, Decreased strength, Difficulty walking, Increased muscle spasms, Impaired flexibility, Postural dysfunction, Improper body mechanics, Pain  Visit Diagnosis: Unsteadiness on feet  Muscle weakness (generalized)  Chronic bilateral low back pain without sciatica  Hemiplegia and hemiparesis following nontraumatic intracerebral hemorrhage affecting left non-dominant side (HCC)  Other symptoms and signs involving the nervous system     Problem List Patient Active Problem List   Diagnosis Date Noted  . Intermediate stage nonexudative age-related macular degeneration of right eye 05/08/2020  . Exudative age-related macular degeneration of left eye with active choroidal neovascularization ([place]) 10/11/2019  . Intermediate stage nonexudative age-related macular degeneration of both eyes 10/11/2019  . Operculated retinal tear of left eye 10/11/2019  . Right posterior capsular opacification 10/11/2019  . Posterior vitreous detachment of left eye 10/11/2019  . Paving stone retinal degeneration of right eye 10/11/2019  . Calculus of kidney 03/12/2018  . Bleeding internal hemorrhoids 10/09/2017  . Sleep apnea 09/22/2017  . History of adenomatous polyp of colon 09/18/2017  . Prostate  cancer ([place]) 09/18/2017  . Essential hypertension 08/26/2017  . Hyperlipidemia 08/26/2017  . Cerebral venous thrombosis 07/13/2017  . Intracerebral hemorrhage 07/10/2017  . ICH (intracerebral hemorrhage) ([place]) 07/10/2017  . Unstable angina [facility]) 03/12/2013    Silvestre Mesi 05/11/2020, 8:46 AM  [facility] Physical Therapy [address] [place], [place], [address] Phone: [phone]   Fax:  [phone]  Name: Justin Adams MRN: [id number] Date of Birth: 09/17/1945

## 2020-05-16 ENCOUNTER — Ambulatory Visit (INDEPENDENT_AMBULATORY_CARE_PROVIDER_SITE_OTHER): Payer: Medicare Other | Admitting: Physical Therapy

## 2020-05-16 ENCOUNTER — Other Ambulatory Visit: Payer: Self-pay

## 2020-05-16 DIAGNOSIS — I69154 Hemiplegia and hemiparesis following nontraumatic intracerebral hemorrhage affecting left non-dominant side: Secondary | ICD-10-CM

## 2020-05-16 DIAGNOSIS — M545 Low back pain, unspecified: Secondary | ICD-10-CM | POA: Diagnosis not present

## 2020-05-16 DIAGNOSIS — R2681 Unsteadiness on feet: Secondary | ICD-10-CM | POA: Diagnosis not present

## 2020-05-16 DIAGNOSIS — G8929 Other chronic pain: Secondary | ICD-10-CM

## 2020-05-16 DIAGNOSIS — M6281 Muscle weakness (generalized): Secondary | ICD-10-CM

## 2020-05-16 DIAGNOSIS — R29818 Other symptoms and signs involving the nervous system: Secondary | ICD-10-CM

## 2020-05-16 NOTE — Therapy (Signed)
[place], [place], [address] Phone: [phone]   Fax:  [phone]  Physical Therapy Treatment  Patient Details  Name: Justin Adams MRN: [id number] Date of Birth: [birth year] Referring Provider (PT): Stoneking MD   Encounter Date: 05/16/2020   PT End of Session - 05/16/20 0849    Visit Number 8    Number of Visits 18    Date for PT Re-Evaluation 06/15/20    Authorization Type MCR, needs KX    PT Start Time 0800    PT Stop Time 0844    PT Time Calculation (min) 44 min    Activity Tolerance Patient tolerated treatment well    Behavior During Therapy [facility] for tasks assessed/performed           Past Medical History:  Diagnosis Date  . Adenomatous polyp   . Bleeding internal hemorrhoids 10/2017  . Cerebral venous thrombosis   . Grade I diastolic dysfunction 53/97/6734   noted on ECHO   . Hemorrhoids   . History of kidney stones   . Hypercholesteremia   . Hypertension   . Intracranial hemorrhage (HCC)    Right temporal lobe  . LVH (left ventricular hypertrophy) 07/11/2017   Mild, noted on ECHO   . Muscular degeneration   . Nephrolithiasis   . Obesity   . OSA (obstructive sleep apnea)   . Polycythemia   . Prostate cancer ([place]) 2003  . Shingles   . Stroke ([place]) 06/2017   No residual  . Unsteady gait   . Ureteral calculi 02/12/2008   Left    Past Surgical History:  Procedure Laterality Date  . APPENDECTOMY     childhood  . CHOLECYSTECTOMY    . COLONOSCOPY    . HEMORRHOID BANDING  2015  . HOLMIUM LASER APPLICATION Left 19/08/7900   Procedure: HOLMIUM LASER APPLICATION;  Surgeon: Franchot Gallo, MD;  Location: [facility];  Service: Urology;  Laterality: Left;  . IR ANGIO EXTERNAL CAROTID SEL EXT CAROTID UNI R MOD SED  07/10/2017  . IR ANGIO INTRA EXTRACRAN SEL INTERNAL CAROTID BILAT MOD SED  07/10/2017  . IR ANGIO VERTEBRAL SEL VERTEBRAL BILAT MOD SED  07/10/2017  . IR URETERAL STENT LEFT NEW  ACCESS W/O SEP NEPHROSTOMY CATH  03/12/2018  . LEFT HEART CATHETERIZATION WITH CORONARY ANGIOGRAM N/A 03/15/2013   Procedure: LEFT HEART CATHETERIZATION WITH CORONARY ANGIOGRAM;  Surgeon: Minus Breeding, MD;  Location: [facility] CATH LAB;  Service: Cardiovascular;  Laterality: N/A;  . NEPHROLITHOTOMY Left 03/12/2018   Procedure: LEFT NEPHROLITHOTOMY PERCUTANEOUS, STENT PLACEMENT;  Surgeon: Franchot Gallo, MD;  Location: [facility];  Service: Urology;  Laterality: Left;  . PROSTATECTOMY    . STONE EXTRACTION WITH BASKET      There were no vitals filed for this visit.   Subjective Assessment - 05/16/20 0815    Subjective relays the back pain is ok today.    Limitations Lifting;House hold activities    Patient Stated Goals reduce pain, improve balance    Pain Onset More than a month ago                             [facility] Adult PT Treatment/Exercise - 05/16/20 0001      Neuro Re-ed    Neuro Re-ed Details  tandem walk 5 trips, SLS  3 cone taps X 5 reps, airex pad balance feet together with head nods, head turns, and then feet slightly apart eyes closed  Lumbar Exercises: Stretches   Gastroc Stretch 3 reps;30 seconds    Gastroc Stretch Limitations slantboard    Other Lumbar Stretch Exercise seated pball roll outs 10 sec X 10 for lumbar flexion       Lumbar Exercises: Aerobic   Nustep seat 15, L6 X 8 min      Lumbar Exercises: Machines for Strengthening   Cybex Knee Extension 10 lbs 3X10    Cybex Knee Flexion 25 lbs 2X15    Leg Press 100 lbs 3 sets of 10    Other Lumbar Machine Exercise row machine 35 lbs 2X10 reps    Other Lumbar Machine Exercise lat pull 35 lbs 2X10 reps                    PT Short Term Goals - 04/21/20 1115      PT SHORT TERM GOAL #1   Title Pt will report performing walking program, HEP and [employer] 2-3 days/week    Time 4    Period Weeks    Status On-going    Target Date 04/20/20      PT SHORT TERM GOAL #2   Title Pt will  improve functional LE strength decreasing five time sit to stand to </= 15 seconds    Baseline 19    Time 4    Period Weeks    Status On-going             PT Long Term Goals - 05/09/20 1610      PT LONG TERM GOAL #1   Title Pt will improve 6 minute walk test by 160 ft(one extra lap) to show improved endurance and gait speed.    Time 12    Period Weeks    Status On-going      PT LONG TERM GOAL #2   Title Pt will increaese overall leg strength to at least 4+ to 5- overall to improve function    Baseline now 4+ on 11/30    Time 12    Period Weeks    Status On-going      PT LONG TERM GOAL #3   Title Pt will improve 5 times sit to stand test to less than 13 seconds    Baseline 19    Time 12    Period Weeks    Status On-going      PT LONG TERM GOAL #4   Title -      PT LONG TERM GOAL #5   Title -                 Plan - 05/16/20 0849    Clinical Impression Statement Session focused on reviewing weight machines that he can do at the gym to augment his progress with PT. He did show significant improvements in balance today and will continue to benefit from PT.    Personal Factors and Comorbidities Comorbidity 3+    Comorbidities CVA, sleep apnea, chronic LBP, history of falls    Examination-Activity Limitations Carry;Bend;Bed Mobility;Dressing;Lift;Stand;Stairs;Squat;Sleep;Locomotion Level    Examination-Participation Restrictions Cleaning;Community Activity;Driving;Laundry;Yard Work;Volunteer    Stability/Clinical Decision Making Evolving/Moderate complexity    Rehab Potential Good    PT Frequency 2x / week   1-2   PT Duration 12 weeks    PT Treatment/Interventions ADLs/Self Care Home Management;Cryotherapy;Electrical Stimulation;Moist Heat;Traction;Gait Scientist, forensic;Therapeutic activities;Therapeutic exercise;Balance training;Neuromuscular re-education;Manual techniques;Passive range of motion;Dry needling;Joint Manipulations;Spinal Manipulations;Taping     PT Next Visit Plan balance, leg strength, endurance, pball core  PT Home Exercise Plan tandem balance, SLS, squats, sit to stands    Consulted and Agree with Plan of Care Patient           Patient will benefit from skilled therapeutic intervention in order to improve the following deficits and impairments:  Decreased activity tolerance, Decreased balance, Decreased endurance, Decreased mobility, Decreased range of motion, Decreased strength, Difficulty walking, Increased muscle spasms, Impaired flexibility, Postural dysfunction, Improper body mechanics, Pain  Visit Diagnosis: Unsteadiness on feet  Muscle weakness (generalized)  Chronic bilateral low back pain without sciatica  Hemiplegia and hemiparesis following nontraumatic intracerebral hemorrhage affecting left non-dominant side (HCC)  Other symptoms and signs involving the nervous system     Problem List Patient Active Problem List   Diagnosis Date Noted  . Intermediate stage nonexudative age-related macular degeneration of right eye 05/08/2020  . Exudative age-related macular degeneration of left eye with active choroidal neovascularization ([place]) 10/11/2019  . Intermediate stage nonexudative age-related macular degeneration of both eyes 10/11/2019  . Operculated retinal tear of left eye 10/11/2019  . Right posterior capsular opacification 10/11/2019  . Posterior vitreous detachment of left eye 10/11/2019  . Paving stone retinal degeneration of right eye 10/11/2019  . Calculus of kidney 03/12/2018  . Bleeding internal hemorrhoids 10/09/2017  . Sleep apnea 09/22/2017  . History of adenomatous polyp of colon 09/18/2017  . Prostate cancer ([place]) 09/18/2017  . Essential hypertension 08/26/2017  . Hyperlipidemia 08/26/2017  . Cerebral venous thrombosis 07/13/2017  . Intracerebral hemorrhage 07/10/2017  . ICH (intracerebral hemorrhage) ([place]) 07/10/2017  . Unstable angina ([place]) 03/12/2013    Debbe Odea ,PT,DPT  05/16/2020, 8:51 AM  [facility] Physical Therapy [address] [place], [place], [address] Phone: [phone]   Fax:  [phone]  Name: Justin Adams MRN: [id number] Date of Birth: 1945/12/30

## 2020-05-18 ENCOUNTER — Encounter: Payer: Medicare Other | Admitting: Physical Therapy

## 2020-05-23 ENCOUNTER — Encounter: Payer: Medicare Other | Admitting: Physical Therapy

## 2020-05-25 ENCOUNTER — Encounter: Payer: Medicare Other | Admitting: Physical Therapy

## 2020-05-30 ENCOUNTER — Ambulatory Visit (INDEPENDENT_AMBULATORY_CARE_PROVIDER_SITE_OTHER): Payer: Medicare Other | Admitting: Physical Therapy

## 2020-05-30 ENCOUNTER — Other Ambulatory Visit: Payer: Self-pay

## 2020-05-30 DIAGNOSIS — R2681 Unsteadiness on feet: Secondary | ICD-10-CM | POA: Diagnosis not present

## 2020-05-30 DIAGNOSIS — G8929 Other chronic pain: Secondary | ICD-10-CM

## 2020-05-30 DIAGNOSIS — R29818 Other symptoms and signs involving the nervous system: Secondary | ICD-10-CM

## 2020-05-30 DIAGNOSIS — M545 Low back pain, unspecified: Secondary | ICD-10-CM | POA: Diagnosis not present

## 2020-05-30 DIAGNOSIS — I69154 Hemiplegia and hemiparesis following nontraumatic intracerebral hemorrhage affecting left non-dominant side: Secondary | ICD-10-CM | POA: Diagnosis not present

## 2020-05-30 DIAGNOSIS — M6281 Muscle weakness (generalized): Secondary | ICD-10-CM

## 2020-05-30 NOTE — Therapy (Signed)
[name], [place], [address] Phone: [phone]   Fax:  [phone]  Physical Therapy Treatment  Patient Details  Name: Justin Adams MRN: [id number] Date of Birth: 02/11/1946 Referring Provider (PT): Stoneking MD   Encounter Date: 05/30/2020   PT End of Session - 05/30/20 0810    Visit Number 9    Number of Visits 18    Date for PT Re-Evaluation 06/15/20    Authorization Type MCR, needs KX    PT Start Time 0800    PT Stop Time 0845    PT Time Calculation (min) 45 min    Activity Tolerance Patient tolerated treatment well    Behavior During Therapy [facility] for tasks assessed/performed           Past Medical History:  Diagnosis Date  . Adenomatous polyp   . Bleeding internal hemorrhoids 10/2017  . Cerebral venous thrombosis   . Grade I diastolic dysfunction 18/29/9371   noted on ECHO   . Hemorrhoids   . History of kidney stones   . Hypercholesteremia   . Hypertension   . Intracranial hemorrhage (HCC)    Right temporal lobe  . LVH (left ventricular hypertrophy) 07/11/2017   Mild, noted on ECHO   . Muscular degeneration   . Nephrolithiasis   . Obesity   . OSA (obstructive sleep apnea)   . Polycythemia   . Prostate cancer ([place]) 2003  . Shingles   . Stroke ([place]) 06/2017   No residual  . Unsteady gait   . Ureteral calculi 02/12/2008   Left    Past Surgical History:  Procedure Laterality Date  . APPENDECTOMY     childhood  . CHOLECYSTECTOMY    . COLONOSCOPY    . HEMORRHOID BANDING  2015  . HOLMIUM LASER APPLICATION Left 69/11/7891   Procedure: HOLMIUM LASER APPLICATION;  Surgeon: Franchot Gallo, MD;  Location: [facility];  Service: Urology;  Laterality: Left;  . IR ANGIO EXTERNAL CAROTID SEL EXT CAROTID UNI R MOD SED  07/10/2017  . IR ANGIO INTRA EXTRACRAN SEL INTERNAL CAROTID BILAT MOD SED  07/10/2017  . IR ANGIO VERTEBRAL SEL VERTEBRAL BILAT MOD SED  07/10/2017  . IR URETERAL STENT LEFT NEW  ACCESS W/O SEP NEPHROSTOMY CATH  03/12/2018  . LEFT HEART CATHETERIZATION WITH CORONARY ANGIOGRAM N/A 03/15/2013   Procedure: LEFT HEART CATHETERIZATION WITH CORONARY ANGIOGRAM;  Surgeon: Minus Breeding, MD;  Location: [facility] CATH LAB;  Service: Cardiovascular;  Laterality: N/A;  . NEPHROLITHOTOMY Left 03/12/2018   Procedure: LEFT NEPHROLITHOTOMY PERCUTANEOUS, STENT PLACEMENT;  Surgeon: Franchot Gallo, MD;  Location: [facility];  Service: Urology;  Laterality: Left;  . PROSTATECTOMY    . STONE EXTRACTION WITH BASKET      There were no vitals filed for this visit.   Subjective Assessment - 05/30/20 0823    Subjective he has been on vacation and went to the gym and has been performing his HEP while he was away, denies significant pain upon arrival    Limitations Lifting;House hold activities    Patient Stated Goals reduce pain, improve balance    Pain Onset More than a month ago                             [facility] Adult PT Treatment/Exercise - 05/30/20 0001      Neuro Re-ed    Neuro Re-ed Details  tandem walk and sidestepping on foam 5 trips, SLS reps bilat  for 10 sec      Lumbar Exercises: Stretches   Double Knee to Chest Stretch Limitations 10 reps 10 seconds with feet on pball    Other Lumbar Stretch Exercise seated pball roll outs 10 sec X 10 for lumbar flexion       Lumbar Exercises: Aerobic   Recumbent Bike L3 X 8 min      Lumbar Exercises: Machines for Strengthening   Cybex Knee Extension 10 lbs 3X10    Cybex Knee Flexion 25 lbs 2X15    Leg Press 100 lbs 3 sets of 10    Other Lumbar Machine Exercise row machine 35 lbs 3X10 reps    Other Lumbar Machine Exercise lat pull 35 lbs 3X10 reps      Lumbar Exercises: Seated   Sit to Stand Limitations 2 sets of 10 reps on UE support, 24 inch      Lumbar Exercises: Supine   AB Set Limitations with pball pushing arms down into ball and knees up into ball 5 sec X 15 reps                    PT Short Term Goals  - 04/21/20 1115      PT SHORT TERM GOAL #1   Title Pt will report performing walking program, HEP and [employer] 2-3 days/week    Time 4    Period Weeks    Status On-going    Target Date 04/20/20      PT SHORT TERM GOAL #2   Title Pt will improve functional LE strength decreasing five time sit to stand to </= 15 seconds    Baseline 19    Time 4    Period Weeks    Status On-going             PT Long Term Goals - 05/09/20 [id number]      PT LONG TERM GOAL #1   Title Pt will improve 6 minute walk test by 160 ft(one extra lap) to show improved endurance and gait speed.    Time 12    Period Weeks    Status On-going      PT LONG TERM GOAL #2   Title Pt will increaese overall leg strength to at least 4+ to 5- overall to improve function    Baseline now 4+ on 11/30    Time 12    Period Weeks    Status On-going      PT LONG TERM GOAL #3   Title Pt will improve 5 times sit to stand test to less than 13 seconds    Baseline 19    Time 12    Period Weeks    Status On-going      PT LONG TERM GOAL #4   Title -      PT LONG TERM GOAL #5   Title -                 Plan - 05/30/20 0855    Clinical Impression Statement Session focused on continueing to address his impairments in strenth, endurance, and balance. He had good overall tolerance without any back pain. He will need progress note next visit    Personal Factors and Comorbidities Comorbidity 3+    Comorbidities CVA, sleep apnea, chronic LBP, history of falls    Examination-Activity Limitations Carry;Bend;Bed Mobility;Dressing;Lift;Stand;Stairs;Squat;Sleep;Locomotion Level    Examination-Participation Restrictions Cleaning;Community Activity;Driving;Laundry;Yard Work;Volunteer    Stability/Clinical Decision Making Evolving/Moderate complexity    Rehab Potential  Good    PT Frequency 2x / week   1-2   PT Duration 12 weeks    PT Treatment/Interventions ADLs/Self Care Home Management;Cryotherapy;Electrical  Stimulation;Moist Heat;Traction;Gait Scientist, forensic;Therapeutic activities;Therapeutic exercise;Balance training;Neuromuscular re-education;Manual techniques;Passive range of motion;Dry needling;Joint Manipulations;Spinal Manipulations;Taping    PT Next Visit Plan balance, leg strength, endurance, pball core    PT Home Exercise Plan tandem balance, SLS, squats, sit to stands    Consulted and Agree with Plan of Care Patient           Patient will benefit from skilled therapeutic intervention in order to improve the following deficits and impairments:  Decreased activity tolerance,Decreased balance,Decreased endurance,Decreased mobility,Decreased range of motion,Decreased strength,Difficulty walking,Increased muscle spasms,Impaired flexibility,Postural dysfunction,Improper body mechanics,Pain  Visit Diagnosis: Unsteadiness on feet  Muscle weakness (generalized)  Chronic bilateral low back pain without sciatica  Hemiplegia and hemiparesis following nontraumatic intracerebral hemorrhage affecting left non-dominant side (HCC)  Other symptoms and signs involving the nervous system     Problem List Patient Active Problem List   Diagnosis Date Noted  . Intermediate stage nonexudative age-related macular degeneration of right eye 05/08/2020  . Exudative age-related macular degeneration of left eye with active choroidal neovascularization ([place]) 10/11/2019  . Intermediate stage nonexudative age-related macular degeneration of both eyes 10/11/2019  . Operculated retinal tear of left eye 10/11/2019  . Right posterior capsular opacification 10/11/2019  . Posterior vitreous detachment of left eye 10/11/2019  . Paving stone retinal degeneration of right eye 10/11/2019  . Calculus of kidney 03/12/2018  . Bleeding internal hemorrhoids 10/09/2017  . Sleep apnea 09/22/2017  . History of adenomatous polyp of colon 09/18/2017  . Prostate cancer ([place]) 09/18/2017  . Essential hypertension  08/26/2017  . Hyperlipidemia 08/26/2017  . Cerebral venous thrombosis 07/13/2017  . Intracerebral hemorrhage 07/10/2017  . ICH (intracerebral hemorrhage) ([place]) 07/10/2017  . Unstable angina [facility]) 03/12/2013    Silvestre Mesi 05/30/2020, 8:57 AM  [facility] Physical Therapy [address] [place], [place], [address] Phone: [phone]   Fax:  [phone]  Name: TAMAS ZAVALETA MRN: [id number] Date of Birth: 22-Nov-1945

## 2020-06-01 ENCOUNTER — Other Ambulatory Visit: Payer: Self-pay

## 2020-06-01 ENCOUNTER — Ambulatory Visit (INDEPENDENT_AMBULATORY_CARE_PROVIDER_SITE_OTHER): Payer: Medicare Other | Admitting: Physical Therapy

## 2020-06-01 ENCOUNTER — Encounter: Payer: Self-pay | Admitting: Physical Therapy

## 2020-06-01 DIAGNOSIS — M545 Low back pain, unspecified: Secondary | ICD-10-CM | POA: Diagnosis not present

## 2020-06-01 DIAGNOSIS — R29818 Other symptoms and signs involving the nervous system: Secondary | ICD-10-CM | POA: Diagnosis not present

## 2020-06-01 DIAGNOSIS — M6281 Muscle weakness (generalized): Secondary | ICD-10-CM

## 2020-06-01 DIAGNOSIS — I69154 Hemiplegia and hemiparesis following nontraumatic intracerebral hemorrhage affecting left non-dominant side: Secondary | ICD-10-CM

## 2020-06-01 DIAGNOSIS — R2681 Unsteadiness on feet: Secondary | ICD-10-CM | POA: Diagnosis not present

## 2020-06-01 DIAGNOSIS — G8929 Other chronic pain: Secondary | ICD-10-CM | POA: Diagnosis not present

## 2020-06-01 NOTE — Therapy (Signed)
[facility] Physical Therapy [address] [place], [place], [address] Phone: [phone]   Fax:  [phone]  Physical Therapy Treatment/Progress note Progress Note reporting period date 03/23/20 to 06/01/20  See below for objective and subjective measurements relating to patients progress with PT.   Patient Details  Name: Justin Adams MRN: [id number] Date of Birth: 12/22/1945 Referring Provider (PT): Stoneking MD   Encounter Date: 06/01/2020   PT End of Session - 06/01/20 0859    Visit Number 10    Number of Visits 18    Date for PT Re-Evaluation 06/15/20    Authorization Type MCR, needs KX    PT Start Time 0803    PT Stop Time 0845    PT Time Calculation (min) 42 min    Activity Tolerance Patient tolerated treatment well    Behavior During Therapy [facility] for tasks assessed/performed           Past Medical History:  Diagnosis Date   Adenomatous polyp    Bleeding internal hemorrhoids 10/2017   Cerebral venous thrombosis    Grade I diastolic dysfunction 41/93/7902   noted on ECHO    Hemorrhoids    History of kidney stones    Hypercholesteremia    Hypertension    Intracranial hemorrhage ([place])    Right temporal lobe   LVH (left ventricular hypertrophy) 07/11/2017   Mild, noted on ECHO    Muscular degeneration    Nephrolithiasis    Obesity    OSA (obstructive sleep apnea)    Polycythemia    Prostate cancer ([place]) 2003   Shingles    Stroke ([place]) 06/2017   No residual   Unsteady gait    Ureteral calculi 02/12/2008   Left    Past Surgical History:  Procedure Laterality Date   APPENDECTOMY     childhood   CHOLECYSTECTOMY     COLONOSCOPY     HEMORRHOID BANDING  2015   HOLMIUM LASER APPLICATION Left 40/02/7352   Procedure: HOLMIUM LASER APPLICATION;  Surgeon: Franchot Gallo, MD;  [facility];  Service: Urology;  Laterality: Left;   IR ANGIO EXTERNAL CAROTID SEL EXT CAROTID UNI R MOD SED  07/10/2017    IR ANGIO INTRA EXTRACRAN SEL INTERNAL CAROTID BILAT MOD SED  07/10/2017   IR ANGIO VERTEBRAL SEL VERTEBRAL BILAT MOD SED  07/10/2017   IR URETERAL STENT LEFT NEW ACCESS W/O SEP NEPHROSTOMY CATH  03/12/2018   LEFT HEART CATHETERIZATION WITH CORONARY ANGIOGRAM N/A 03/15/2013   Procedure: LEFT HEART CATHETERIZATION WITH CORONARY ANGIOGRAM;  Surgeon: Minus Breeding, MD;  Location: [facility] CATH LAB;  Service: Cardiovascular;  Laterality: N/A;   NEPHROLITHOTOMY Left 03/12/2018   Procedure: LEFT NEPHROLITHOTOMY PERCUTANEOUS, STENT PLACEMENT;  Surgeon: Franchot Gallo, MD;  [facility];  Service: Urology;  Laterality: Left;   PROSTATECTOMY     STONE EXTRACTION WITH BASKET      There were no vitals filed for this visit.   Subjective Assessment - 06/01/20 0851    Subjective denies pain today upon arrival, denies any falls since starting back with PT    Limitations Lifting;House hold activities    Patient Stated Goals reduce pain, improve balance    Pain Onset More than a month ago              [facility] PT Assessment - 06/01/20 0001      Assessment   Medical Diagnosis LBP, balance, general weakness    Referring Provider (PT) Stoneking MD    Next MD Visit end  of Jan      Precautions   Precautions Fall      Single Leg Stance   Comments 20 sec avg on Rt (30 sec at best), 15.3 sec avg on Rt (24 sec at best)      Strength   Right Hip Flexion 4+/5    Right Hip ABduction 4+/5    Left Hip Flexion 4+/5    Left Hip ABduction 4+/5    Right Knee Flexion 5/5    Right Knee Extension 5/5    Left Knee Flexion 5/5    Left Knee Extension 5/5      Transfers   Five time sit to stand comments  14.99 using UE to push up      6 minute walk test results    Aerobic Endurance Distance Walked 1320                         [facility] Adult PT Treatment/Exercise - 06/01/20 0001      Lumbar Exercises: Stretches   Double Knee to Chest Stretch Limitations 5 reps 10 seconds with feet on  pball    Other Lumbar Stretch Exercise seated pball roll outs 10 sec X 10 for lumbar flexion       Lumbar Exercises: Aerobic   Recumbent Bike L3 X 8 min      Lumbar Exercises: Supine   Bridge 5 seconds;15 reps    Bridge Limitations feet on pball                    PT Short Term Goals - 06/01/20 0904      PT SHORT TERM GOAL #1   Title Pt will report performing walking program, HEP and [employer] 2-3 days/week    Baseline relays he averages 2 times per week on 12/23    Time 4    Period Weeks    Status Achieved    Target Date 04/20/20      PT SHORT TERM GOAL #2   Title Pt will improve functional LE strength decreasing five time sit to stand to </= 15 seconds    Baseline 14.99 on 12/23    Time 4    Period Weeks    Status Achieved             PT Long Term Goals - 06/01/20 0906      PT LONG TERM GOAL #1   Title Pt will improve 6 minute walk test by 160 ft(one extra lap) to show improved endurance and gait speed.    Baseline improved 168 ft so 1320 ft total on 12/23    Time 12    Period Weeks    Status Achieved      PT LONG TERM GOAL #2   Title Pt will increaese overall leg strength to at least 4+ to 5- overall to improve function    Baseline now 4+    Time 12    Period Weeks    Status On-going      PT LONG TERM GOAL #3   Title Pt will improve 5 times sit to stand test to less than 13 seconds    Baseline 14.99 on 12/23    Time 12    Period Weeks    Status On-going      PT LONG TERM GOAL #4   Title -      PT LONG TERM GOAL #5   Title -  Plan - 06/01/20 0900    Clinical Impression Statement Progress note today shows improvments in overall balance, endurance, and leg strength. He has now met his STG and 1/3 of his long term goals. He will continue to benefit from skilled PT to improve overalll function and balance to reduce his risk of falling.    Personal Factors and Comorbidities Comorbidity 3+    Comorbidities CVA, sleep  apnea, chronic LBP, history of falls    Examination-Activity Limitations Carry;Bend;Bed Mobility;Dressing;Lift;Stand;Stairs;Squat;Sleep;Locomotion Level    Examination-Participation Restrictions Cleaning;Community Activity;Driving;Laundry;Yard Work;Volunteer    Stability/Clinical Decision Making Evolving/Moderate complexity    Rehab Potential Good    PT Frequency 2x / week   1-2   PT Duration 12 weeks    PT Treatment/Interventions ADLs/Self Care Home Management;Cryotherapy;Electrical Stimulation;Moist Heat;Traction;Gait Scientist, forensic;Therapeutic activities;Therapeutic exercise;Balance training;Neuromuscular re-education;Manual techniques;Passive range of motion;Dry needling;Joint Manipulations;Spinal Manipulations;Taping    PT Next Visit Plan balance, leg strength, endurance, pball core    PT Home Exercise Plan tandem balance, SLS, squats, sit to stands    Consulted and Agree with Plan of Care Patient           Patient will benefit from skilled therapeutic intervention in order to improve the following deficits and impairments:  Decreased activity tolerance,Decreased balance,Decreased endurance,Decreased mobility,Decreased range of motion,Decreased strength,Difficulty walking,Increased muscle spasms,Impaired flexibility,Postural dysfunction,Improper body mechanics,Pain  Visit Diagnosis: Unsteadiness on feet  Muscle weakness (generalized)  Chronic bilateral low back pain without sciatica  Hemiplegia and hemiparesis following nontraumatic intracerebral hemorrhage affecting left non-dominant side (HCC)  Other symptoms and signs involving the nervous system     Problem List Patient Active Problem List   Diagnosis Date Noted   Intermediate stage nonexudative age-related macular degeneration of right eye 05/08/2020   Exudative age-related macular degeneration of left eye with active choroidal neovascularization ([place]) 10/11/2019   Intermediate stage nonexudative age-related  macular degeneration of both eyes 10/11/2019   Operculated retinal tear of left eye 10/11/2019   Right posterior capsular opacification 10/11/2019   Posterior vitreous detachment of left eye 10/11/2019   Paving stone retinal degeneration of right eye 10/11/2019   Calculus of kidney 03/12/2018   Bleeding internal hemorrhoids 10/09/2017   Sleep apnea 09/22/2017   History of adenomatous polyp of colon 09/18/2017   Prostate cancer ([place]) 09/18/2017   Essential hypertension 08/26/2017   Hyperlipidemia 08/26/2017   Cerebral venous thrombosis 07/13/2017   Intracerebral hemorrhage 07/10/2017   ICH (intracerebral hemorrhage) ([place]) 07/10/2017   Unstable angina ([place]) 03/12/2013    Silvestre Mesi 06/01/2020, 9:08 AM  [facility] Physical Therapy [address] [place], [place], [address] Phone: [phone]   Fax:  [phone]  Name: RACHID PARHAM MRN: [id number] Date of Birth: December 18, 1945

## 2020-06-05 ENCOUNTER — Ambulatory Visit (INDEPENDENT_AMBULATORY_CARE_PROVIDER_SITE_OTHER): Payer: Medicare Other | Admitting: Physical Therapy

## 2020-06-05 ENCOUNTER — Other Ambulatory Visit: Payer: Self-pay

## 2020-06-05 DIAGNOSIS — R2681 Unsteadiness on feet: Secondary | ICD-10-CM | POA: Diagnosis not present

## 2020-06-05 DIAGNOSIS — M6281 Muscle weakness (generalized): Secondary | ICD-10-CM | POA: Diagnosis not present

## 2020-06-05 DIAGNOSIS — G8929 Other chronic pain: Secondary | ICD-10-CM | POA: Diagnosis not present

## 2020-06-05 DIAGNOSIS — I69154 Hemiplegia and hemiparesis following nontraumatic intracerebral hemorrhage affecting left non-dominant side: Secondary | ICD-10-CM | POA: Diagnosis not present

## 2020-06-05 DIAGNOSIS — R29818 Other symptoms and signs involving the nervous system: Secondary | ICD-10-CM

## 2020-06-05 DIAGNOSIS — M545 Low back pain, unspecified: Secondary | ICD-10-CM | POA: Diagnosis not present

## 2020-06-05 NOTE — Therapy (Signed)
[place], [place], [address] Phone: [phone]   Fax:  [phone]  Physical Therapy Treatment  Patient Details  Name: Justin Adams MRN: [id number] Date of Birth: 1945/07/07 Referring Provider (PT): Stoneking MD   Encounter Date: 06/05/2020   PT End of Session - 06/05/20 1036    Visit Number 11    Number of Visits 18    Date for PT Re-Evaluation 06/15/20    Authorization Type MCR, needs KX    PT Start Time 0930    PT Stop Time 1008    PT Time Calculation (min) 38 min    Activity Tolerance Patient tolerated treatment well    Behavior During Therapy [facility] for tasks assessed/performed           Past Medical History:  Diagnosis Date   Adenomatous polyp    Bleeding internal hemorrhoids 10/2017   Cerebral venous thrombosis    Grade I diastolic dysfunction 0000000   noted on ECHO    Hemorrhoids    History of kidney stones    Hypercholesteremia    Hypertension    Intracranial hemorrhage ([place])    Right temporal lobe   LVH (left ventricular hypertrophy) 07/11/2017   Mild, noted on ECHO    Muscular degeneration    Nephrolithiasis    Obesity    OSA (obstructive sleep apnea)    Polycythemia    Prostate cancer ([place]) 2003   Shingles    Stroke ([place]) 06/2017   No residual   Unsteady gait    Ureteral calculi 02/12/2008   Left    Past Surgical History:  Procedure Laterality Date   APPENDECTOMY     childhood   CHOLECYSTECTOMY     COLONOSCOPY     HEMORRHOID BANDING  2015   HOLMIUM LASER APPLICATION Left [id number]   Procedure: HOLMIUM LASER APPLICATION;  Surgeon: Franchot Gallo, MD;  Location: [facility];  Service: Urology;  Laterality: Left;   IR ANGIO EXTERNAL CAROTID SEL EXT CAROTID UNI R MOD SED  07/10/2017   IR ANGIO INTRA EXTRACRAN SEL INTERNAL CAROTID BILAT MOD SED  07/10/2017   IR ANGIO VERTEBRAL SEL VERTEBRAL BILAT MOD SED  07/10/2017   IR URETERAL STENT LEFT NEW  ACCESS W/O SEP NEPHROSTOMY CATH  03/12/2018   LEFT HEART CATHETERIZATION WITH CORONARY ANGIOGRAM N/A 03/15/2013   Procedure: LEFT HEART CATHETERIZATION WITH CORONARY ANGIOGRAM;  Surgeon: Minus Breeding, MD;  Location: [facility] CATH LAB;  Service: Cardiovascular;  Laterality: N/A;   NEPHROLITHOTOMY Left 03/12/2018   Procedure: LEFT NEPHROLITHOTOMY PERCUTANEOUS, STENT PLACEMENT;  Surgeon: Franchot Gallo, MD;  Location: [facility];  Service: Urology;  Laterality: Left;   PROSTATECTOMY     STONE EXTRACTION WITH BASKET      There were no vitals filed for this visit.   Subjective Assessment - 06/05/20 0956    Subjective relays some back pain when he woke up but he went to the gym this morning and he is feeling better now    Limitations Lifting;House hold activities    Patient Stated Goals reduce pain, improve balance    Pain Onset More than a month ago                             [facility] Adult PT Treatment/Exercise - 06/05/20 0001      Neuro Re-ed    Neuro Re-ed Details  tandem walk and sidestepping on foam 5 trips, retro walk and march walk, airex  balance feet together with head turns/nods, feet apart eyes closed      Lumbar Exercises: Stretches   Double Knee to Chest Stretch Limitations 5 reps 10 seconds with feet on pball    Lower Trunk Rotation 5 reps;10 seconds      Lumbar Exercises: Aerobic   Recumbent Bike L3 X 8 min      Lumbar Exercises: Standing   Other Standing Lumbar Exercises rows and extensions green  X15 ea      Lumbar Exercises: Seated   Sit to Stand Limitations 2 sets of 10 reps on UE support, 24 inch      Lumbar Exercises: Supine   AB Set Limitations with pball pushing arms down into ball and knees up into ball 5 sec X 15 reps    Bridge 5 seconds;15 reps    Bridge Limitations feet on pball                    PT Short Term Goals - 06/01/20 0904      PT SHORT TERM GOAL #1   Title Pt will report performing walking program, HEP and [employer] 2-3 days/week    Baseline relays he averages 2 times per week on 12/23    Time 4    Period Weeks    Status Achieved    Target Date 04/20/20      PT SHORT TERM GOAL #2   Title Pt will improve functional LE strength decreasing five time sit to stand to </= 15 seconds    Baseline 14.99 on 12/23    Time 4    Period Weeks    Status Achieved             PT Long Term Goals - 06/01/20 0906      PT LONG TERM GOAL #1   Title Pt will improve 6 minute walk test by 160 ft(one extra lap) to show improved endurance and gait speed.    Baseline improved 168 ft so 1320 ft total on 12/23    Time 12    Period Weeks    Status Achieved      PT LONG TERM GOAL #2   Title Pt will increaese overall leg strength to at least 4+ to 5- overall to improve function    Baseline now 4+    Time 12    Period Weeks    Status On-going      PT LONG TERM GOAL #3   Title Pt will improve 5 times sit to stand test to less than 13 seconds    Baseline 14.99 on 12/23    Time 12    Period Weeks    Status On-going      PT LONG TERM GOAL #4   Title -      PT LONG TERM GOAL #5   Title -                 Plan - 06/05/20 1037    Clinical Impression Statement Since he had already went to gym and worked on strengthening, todays sesison was more with balance, posture and overall stretching. He had good tolerance without complaints. Continue POC    Personal Factors and Comorbidities Comorbidity 3+    Comorbidities CVA, sleep apnea, chronic LBP, history of falls    Examination-Activity Limitations Carry;Bend;Bed Mobility;Dressing;Lift;Stand;Stairs;Squat;Sleep;Locomotion Level    Examination-Participation Restrictions Cleaning;Community Activity;Driving;Laundry;Yard Work;Volunteer    Stability/Clinical Decision Making Evolving/Moderate complexity    Rehab Potential Good  PT Frequency 2x / week   1-2   PT Duration 12 weeks    PT Treatment/Interventions ADLs/Self Care Home  Management;Cryotherapy;Electrical Stimulation;Moist Heat;Traction;Gait Network engineer;Therapeutic activities;Therapeutic exercise;Balance training;Neuromuscular re-education;Manual techniques;Passive range of motion;Dry needling;Joint Manipulations;Spinal Manipulations;Taping    PT Next Visit Plan balance, leg strength, endurance, pball core    PT Home Exercise Plan tandem balance, SLS, squats, sit to stands    Consulted and Agree with Plan of Care Patient           Patient will benefit from skilled therapeutic intervention in order to improve the following deficits and impairments:  Decreased activity tolerance,Decreased balance,Decreased endurance,Decreased mobility,Decreased range of motion,Decreased strength,Difficulty walking,Increased muscle spasms,Impaired flexibility,Postural dysfunction,Improper body mechanics,Pain  Visit Diagnosis: Unsteadiness on feet  Muscle weakness (generalized)  Chronic bilateral low back pain without sciatica  Hemiplegia and hemiparesis following nontraumatic intracerebral hemorrhage affecting left non-dominant side (HCC)  Other symptoms and signs involving the nervous system     Problem List Patient Active Problem List   Diagnosis Date Noted   Intermediate stage nonexudative age-related macular degeneration of right eye 05/08/2020   Exudative age-related macular degeneration of left eye with active choroidal neovascularization (HCC) 10/11/2019   Intermediate stage nonexudative age-related macular degeneration of both eyes 10/11/2019   Operculated retinal tear of left eye 10/11/2019   Right posterior capsular opacification 10/11/2019   Posterior vitreous detachment of left eye 10/11/2019   Paving stone retinal degeneration of right eye 10/11/2019   Calculus of kidney 03/12/2018   Bleeding internal hemorrhoids 10/09/2017   Sleep apnea 09/22/2017   History of adenomatous polyp of colon 09/18/2017   Prostate cancer (HCC)  09/18/2017   Essential hypertension 08/26/2017   Hyperlipidemia 08/26/2017   Cerebral venous thrombosis 07/13/2017   Intracerebral hemorrhage 07/10/2017   ICH (intracerebral hemorrhage) (HCC) 07/10/2017   Unstable angina (HCC) 03/12/2013    Birdie Riddle 06/05/2020, 10:40 AM  [facility] Physical Therapy [address] [place], [place], [address] Phone: [phone]   Fax:  [phone]  Name: Justin Adams MRN: [id number] Date of Birth: [birth year]

## 2020-06-07 ENCOUNTER — Other Ambulatory Visit: Payer: Self-pay

## 2020-06-07 ENCOUNTER — Ambulatory Visit (INDEPENDENT_AMBULATORY_CARE_PROVIDER_SITE_OTHER): Payer: Medicare Other | Admitting: Physical Therapy

## 2020-06-07 DIAGNOSIS — M6281 Muscle weakness (generalized): Secondary | ICD-10-CM

## 2020-06-07 DIAGNOSIS — R2681 Unsteadiness on feet: Secondary | ICD-10-CM | POA: Diagnosis not present

## 2020-06-07 DIAGNOSIS — M545 Low back pain, unspecified: Secondary | ICD-10-CM

## 2020-06-07 DIAGNOSIS — I69154 Hemiplegia and hemiparesis following nontraumatic intracerebral hemorrhage affecting left non-dominant side: Secondary | ICD-10-CM

## 2020-06-07 DIAGNOSIS — R29818 Other symptoms and signs involving the nervous system: Secondary | ICD-10-CM | POA: Diagnosis not present

## 2020-06-07 DIAGNOSIS — G8929 Other chronic pain: Secondary | ICD-10-CM

## 2020-06-07 NOTE — Therapy (Signed)
[place], [place], [address] Phone: [phone]   Fax:  [phone]  Physical Therapy Treatment  Patient Details  Name: Justin Adams MRN: [id number] Date of Birth: [birth year] Referring Provider (PT): Stoneking MD   Encounter Date: 06/07/2020   PT End of Session - 06/07/20 0928    Visit Number 12    Number of Visits 18    Date for PT Re-Evaluation 06/15/20    Authorization Type MCR, needs KX    PT Start Time 0845    PT Stop Time 0930    PT Time Calculation (min) 45 min    Activity Tolerance Patient tolerated treatment well    Behavior During Therapy [facility] for tasks assessed/performed           Past Medical History:  Diagnosis Date   Adenomatous polyp    Bleeding internal hemorrhoids 10/2017   Cerebral venous thrombosis    Grade I diastolic dysfunction 0000000   noted on ECHO    Hemorrhoids    History of kidney stones    Hypercholesteremia    Hypertension    Intracranial hemorrhage ([place])    Right temporal lobe   LVH (left ventricular hypertrophy) 07/11/2017   Mild, noted on ECHO    Muscular degeneration    Nephrolithiasis    Obesity    OSA (obstructive sleep apnea)    Polycythemia    Prostate cancer ([place]) 2003   Shingles    Stroke ([place]) 06/2017   No residual   Unsteady gait    Ureteral calculi 02/12/2008   Left    Past Surgical History:  Procedure Laterality Date   APPENDECTOMY     childhood   CHOLECYSTECTOMY     COLONOSCOPY     HEMORRHOID BANDING  2015   HOLMIUM LASER APPLICATION Left [id number]   Procedure: HOLMIUM LASER APPLICATION;  Surgeon: Franchot Gallo, MD;  Location: [facility];  Service: Urology;  Laterality: Left;   IR ANGIO EXTERNAL CAROTID SEL EXT CAROTID UNI R MOD SED  07/10/2017   IR ANGIO INTRA EXTRACRAN SEL INTERNAL CAROTID BILAT MOD SED  07/10/2017   IR ANGIO VERTEBRAL SEL VERTEBRAL BILAT MOD SED  07/10/2017   IR URETERAL STENT LEFT NEW  ACCESS W/O SEP NEPHROSTOMY CATH  03/12/2018   LEFT HEART CATHETERIZATION WITH CORONARY ANGIOGRAM N/A 03/15/2013   Procedure: LEFT HEART CATHETERIZATION WITH CORONARY ANGIOGRAM;  Surgeon: Minus Breeding, MD;  Location: [facility] CATH LAB;  Service: Cardiovascular;  Laterality: N/A;   NEPHROLITHOTOMY Left 03/12/2018   Procedure: LEFT NEPHROLITHOTOMY PERCUTANEOUS, STENT PLACEMENT;  Surgeon: Franchot Gallo, MD;  Location[facility];  Service: Urology;  Laterality: Left;   PROSTATECTOMY     STONE EXTRACTION WITH BASKET      There were no vitals filed for this visit.   Subjective Assessment - 06/07/20 0858    Subjective relays he is feeling overall pretty good today, has not had any falls since starting back with PT    Limitations Lifting;House hold activities    Patient Stated Goals reduce pain, improve balance    Pain Onset More than a month ago             [facility] Adult PT Treatment/Exercise - 06/07/20 0001      Neuro Re-ed    Neuro Re-ed Details  tandem walk and sidestepping on foam 5 trips, retro walk, SLS 20 sec X 3 bilat      Lumbar Exercises: Stretches   Double Knee to Chest Stretch Limitations 5 reps  10 seconds with feet on pball    Lower Trunk Rotation 5 reps;10 seconds    Other Lumbar Stretch Exercise seated pball roll outs 10 sec X 10 for lumbar flexion       Lumbar Exercises: Aerobic   Recumbent Bike L3 X 8 min      Lumbar Exercises: Standing   Other Standing Lumbar Exercises rows and extensions green  X20 ea      Lumbar Exercises: Seated   Sit to Stand Limitations 2 sets of 10 reps on UE support, 24 inch      Lumbar Exercises: Supine   AB Set Limitations with pball pushing arms down into ball and knees up into ball 5 sec X 15 reps    Bridge 5 seconds;15 reps    Bridge Limitations feet on pball    Straight Leg Raise 10 reps              PT Short Term Goals - 06/01/20 0904      PT SHORT TERM GOAL #1   Title Pt will report performing walking program, HEP and  [employer] 2-3 days/week    Baseline relays he averages 2 times per week on 12/23    Time 4    Period Weeks    Status Achieved    Target Date 04/20/20      PT SHORT TERM GOAL #2   Title Pt will improve functional LE strength decreasing five time sit to stand to </= 15 seconds    Baseline 14.99 on 12/23    Time 4    Period Weeks    Status Achieved             PT Long Term Goals - 06/01/20 0906      PT LONG TERM GOAL #1   Title Pt will improve 6 minute walk test by 160 ft(one extra lap) to show improved endurance and gait speed.    Baseline improved 168 ft so 1320 ft total on 12/23    Time 12    Period Weeks    Status Achieved      PT LONG TERM GOAL #2   Title Pt will increaese overall leg strength to at least 4+ to 5- overall to improve function    Baseline now 4+    Time 12    Period Weeks    Status On-going      PT LONG TERM GOAL #3   Title Pt will improve 5 times sit to stand test to less than 13 seconds    Baseline 14.99 on 12/23    Time 12    Period Weeks    Status On-going      PT LONG TERM GOAL #4   Title -      PT LONG TERM GOAL #5   Title -                 Plan - 06/07/20 0929    Clinical Impression Statement Continued to work on his deficits of posture, balance, and overall strength. He had good tolerance to this without complaints, balance looks better with retrowalking today. Continue to progress as able.    Personal Factors and Comorbidities Comorbidity 3+    Comorbidities CVA, sleep apnea, chronic LBP, history of falls    Examination-Activity Limitations Carry;Bend;Bed Mobility;Dressing;Lift;Stand;Stairs;Squat;Sleep;Locomotion Level    Examination-Participation Restrictions Cleaning;Community Activity;Driving;Laundry;Yard Work;Volunteer    Stability/Clinical Decision Making Evolving/Moderate complexity    Rehab Potential Good    PT Frequency 2x /  week   1-2   PT Duration 12 weeks    PT Treatment/Interventions ADLs/Self Care Home  Management;Cryotherapy;Electrical Stimulation;Moist Heat;Traction;Gait Scientist, forensic;Therapeutic activities;Therapeutic exercise;Balance training;Neuromuscular re-education;Manual techniques;Passive range of motion;Dry needling;Joint Manipulations;Spinal Manipulations;Taping    PT Next Visit Plan balance, leg strength, endurance, pball core    PT Home Exercise Plan tandem balance, SLS, squats, sit to stands    Consulted and Agree with Plan of Care Patient           Patient will benefit from skilled therapeutic intervention in order to improve the following deficits and impairments:  Decreased activity tolerance,Decreased balance,Decreased endurance,Decreased mobility,Decreased range of motion,Decreased strength,Difficulty walking,Increased muscle spasms,Impaired flexibility,Postural dysfunction,Improper body mechanics,Pain  Visit Diagnosis: Unsteadiness on feet  Muscle weakness (generalized)  Chronic bilateral low back pain without sciatica  Hemiplegia and hemiparesis following nontraumatic intracerebral hemorrhage affecting left non-dominant side (HCC)  Other symptoms and signs involving the nervous system     Problem List Patient Active Problem List   Diagnosis Date Noted   Intermediate stage nonexudative age-related macular degeneration of right eye 05/08/2020   Exudative age-related macular degeneration of left eye with active choroidal neovascularization ([place]) 10/11/2019   Intermediate stage nonexudative age-related macular degeneration of both eyes 10/11/2019   Operculated retinal tear of left eye 10/11/2019   Right posterior capsular opacification 10/11/2019   Posterior vitreous detachment of left eye 10/11/2019   Paving stone retinal degeneration of right eye 10/11/2019   Calculus of kidney 03/12/2018   Bleeding internal hemorrhoids 10/09/2017   Sleep apnea 09/22/2017   History of adenomatous polyp of colon 09/18/2017   Prostate cancer ([place])  09/18/2017   Essential hypertension 08/26/2017   Hyperlipidemia 08/26/2017   Cerebral venous thrombosis 07/13/2017   Intracerebral hemorrhage 07/10/2017   ICH (intracerebral hemorrhage) ([place]) 07/10/2017   Unstable angina ([place]) 03/12/2013    Silvestre Mesi 06/07/2020, 9:30 AM  [facility] Physical Therapy [address] [place], [place], [address] Phone: [phone]   Fax:  [phone]  Name: Justin Adams MRN: [id number] Date of Birth: June 25, 1945

## 2020-06-15 NOTE — Progress Notes (Signed)
NEUROLOGY FOLLOW UP OFFICE NOTE  Justin Adams [id number]   Subjective:  Justin Adams is a 75 year oldright-handed male withhypertension, hypercholesterolemia, and OSA, history of prostate cancer, and history of cerebral hemorrhage secondary to cerebral venous thrombosis whopresents today with new onset headCHE.    UPDATE: Medications include: Eliquis, amlodipine 2.5mg , lisinopril 20mg , atorvastatin 20mg   For the past 2 months, he has been experiencing new onset headaches described as mild to moderate paroxysmal jabs in the right frontal region, occurring 5 to 20 minutes daily.  No severe headache such as when he had the   He reports mild to moderate paroxysmal jabs in the right frontal region every 5 to 20 minutes.  Symptoms started 2 months ago.  No severe headache such as how he presented for the cerebral hemorrhage and thrombosis.  No dizziness, sensory deficits, speech deficits, or focal weakness.  He has macular degeneration but no new visual disturbance. He has tried Tylenol and Advil which have been ineffective.  He is concerned because he has no prior history of headaches and this headache is in the same area when he had the [place].  He has been taking melatonin 3mg  at bedtime for insomnia.  HISTORY: He was admitted to [facility] on 07/09/17 for right frontal headache and nausea. MRI and MRV of head showed right temporal intracerebral hemorrhage with thromboses of the right transversesinus, sigmoid sinus, and rightjugular vein, which was confirmed on diagnostic angiogram. MRA of the head showed no intracranial arterial abnormality such as aneurysm. CT of the chest abdomen and pelvis revealed no malignancy. 2D echocardiogram revealed ejection fraction of 55%. DRVVT borderline elevated at 49.7 but otherwise hypercoagulable panel was negative. LDL was 103 and hemoglobin A1c was 5.3. He has history of polycythemia with Hgb level 17.3-17.5 going back several  years. Hgb on day of admission was 17.5. Subsequent Hgb levels have been around 16. He was started on IV heparin and was subsequently transitioned to Eliquis. He was also discharged on Lipitor 20 mg daily.  He later had a CTV on 09/05/17 which demonstrated interval resolution of the right temporal lobe intraparenchymal hemorrhage with residual encephalomalacia and interval improvement/parital recanalization of right-sided dural venous thrombosis with persistent nonocclusive thrombus within the right transverse and sigmoid sinuses and proximal right internal jugular vein.  PAST MEDICAL HISTORY: Past Medical History:  Diagnosis Date  . Adenomatous polyp   . Bleeding internal hemorrhoids 10/2017  . Cerebral venous thrombosis   . Grade I diastolic dysfunction 0000000   noted on ECHO   . Hemorrhoids   . History of kidney stones   . Hypercholesteremia   . Hypertension   . Intracranial hemorrhage (HCC)    Right temporal lobe  . LVH (left ventricular hypertrophy) 07/11/2017   Mild, noted on ECHO   . Muscular degeneration   . Nephrolithiasis   . Obesity   . OSA (obstructive sleep apnea)   . Polycythemia   . Prostate cancer ([place]) 2003  . Shingles   . Stroke ([place]) 06/2017   No residual  . Unsteady gait   . Ureteral calculi 02/12/2008   Left    MEDICATIONS: Current Outpatient Medications on File Prior to Visit  Medication Sig Dispense Refill  . acetaminophen (TYLENOL) 500 MG tablet Take 500-1,000 mg by mouth daily as needed for moderate pain or headache.    Marland Kitchen amLODipine (NORVASC) 2.5 MG tablet Take 1 tablet (2.5 mg total) by mouth daily as needed (if systolic bp is over [id number]). [place]  tablet 0  . apixaban (ELIQUIS) 5 MG TABS tablet Take 1 tablet (5 mg total) by mouth 2 (two) times daily. 180 tablet 0  . atorvastatin (LIPITOR) 20 MG tablet Take 1 tablet (20 mg total) by mouth daily. 90 tablet 0  . lisinopril (PRINIVIL,ZESTRIL) 20 MG tablet Take 1 tablet (20 mg total) by mouth daily. 30  tablet 1  . Melatonin 3 MG TABS Take 3 mg by mouth at bedtime as needed (sleep).    . polyethylene glycol (MIRALAX / GLYCOLAX) packet Take 17 g by mouth at bedtime.    . Wheat Dextrin (BENEFIBER) POWD Take 1 Dose by mouth daily. (Patient not taking: Reported on 12/10/2019)     No current facility-administered medications on file prior to visit.    ALLERGIES: No Known Allergies  FAMILY HISTORY: Family History  Problem Relation Age of Onset  . Hypertension Mother   . Coronary artery disease Mother 25  . Breast cancer Mother   . Coronary artery disease Father 71       Died age 37  . CAD Brother 30    SOCIAL HISTORY: Social History   Socioeconomic History  . Marital status: Married    Spouse name: Marcie Bal  . Number of children: 2  . Years of education: Not on file  . Highest education level: Bachelor's degree (e.g., BA, AB, BS)  Occupational History  . Occupation: retired  Tobacco Use  . Smoking status: Never Smoker  . Smokeless tobacco: Never Used  Vaping Use  . Vaping Use: Never used  Substance and Sexual Activity  . Alcohol use: No  . Drug use: No  . Sexual activity: Not on file  Other Topics Concern  . Not on file  Social History Narrative   Patient is right-handed. He lives with his wife in a 2 level home, though rarely goes to the 2nd floor. He avoids caffeine. He goes to the gym 5 x a week.   Social Determinants of Health   Financial Resource Strain: Not on file  Food Insecurity: Not on file  Transportation Needs: Not on file  Physical Activity: Not on file  Stress: Not on file  Social Connections: Not on file  Intimate Partner Violence: Not on file     Objective:  Blood pressure 123/71, pulse 83, height 6\' 2"  (1.88 m), weight 243 lb 6.4 oz (110.4 kg), SpO2 98 %. General: No acute distress.  Patient appears well-groomed.   Head:  Normocephalic/atraumatic Eyes:  Fundi examined but not visualized Neck: supple, no paraspinal tenderness, full range of  motion Heart:  Regular rate and rhythm Lungs:  Clear to auscultation bilaterally Back: No paraspinal tenderness Neurological Exam: alert and oriented to person, place, and time. Attention span and concentration intact, recent and remote memory intact, fund of knowledge intact.  Speech fluent and not dysarthric, language intact.  CN II-XII intact. Bulk and tone normal, muscle strength 5/5 throughout.  Sensation to pinprick intact, sensation to vibration reduced in feet.  Deep tendon reflexes 1+ throughout, toes downgoing.  Finger to nose and heel to shin testing intact.  Gait mildly wide-based, Romberg negative.   Assessment/Plan:   1.  New onset right frontal headache - given prior history of intracerebral hemorrhage, we need to rule out a recurrent bleed.  Recurrent cerebral venous thrombosis less likely as he is already on anticoagulation.  However, anticoagulation would put him at risk for a bleed.  I would also like to evaluate for potential cerebral aneurysm as well.  If imaging unremarkable, then I would suspect primary stabbing headache. 2.  History of cerebral venous thrombosis with right temporal intracerebral hemorrhage   1.  CT/CTA of brain today 2.  If imaging negative for acute findings, then I would have him increase melatonin to 9mg  at bedtime to treat primary stabbing headache.  He may increase further to 12mg  at bedtime if needed.  If melatonin ineffective, would start gabapentin.  Marland Kitchen, DO  CC:  , MD

## 2020-06-16 ENCOUNTER — Encounter: Payer: Self-pay | Admitting: Neurology

## 2020-06-16 ENCOUNTER — Telehealth: Payer: Self-pay

## 2020-06-16 ENCOUNTER — Other Ambulatory Visit: Payer: Self-pay

## 2020-06-16 ENCOUNTER — Ambulatory Visit (INDEPENDENT_AMBULATORY_CARE_PROVIDER_SITE_OTHER): Payer: Medicare Other | Admitting: Neurology

## 2020-06-16 ENCOUNTER — Ambulatory Visit
Admission: RE | Admit: 2020-06-16 | Discharge: 2020-06-16 | Disposition: A | Discharge status: Home / Self Care | Payer: Medicare Other | Source: Ambulatory Visit | Attending: Neurology | Admitting: Neurology

## 2020-06-16 ENCOUNTER — Other Ambulatory Visit: Payer: Medicare Other

## 2020-06-16 VITALS — BP 123/71 | HR 83 | Ht 74.0 in | Wt 243.4 lb

## 2020-06-16 DIAGNOSIS — Z8679 Personal history of other diseases of the circulatory system: Secondary | ICD-10-CM | POA: Diagnosis not present

## 2020-06-16 DIAGNOSIS — G08 Intracranial and intraspinal phlebitis and thrombophlebitis: Secondary | ICD-10-CM | POA: Diagnosis not present

## 2020-06-16 DIAGNOSIS — R519 Headache, unspecified: Secondary | ICD-10-CM | POA: Diagnosis not present

## 2020-06-16 DIAGNOSIS — I619 Nontraumatic intracerebral hemorrhage, unspecified: Secondary | ICD-10-CM | POA: Diagnosis not present

## 2020-06-16 DIAGNOSIS — J3489 Other specified disorders of nose and nasal sinuses: Secondary | ICD-10-CM | POA: Diagnosis not present

## 2020-06-16 DIAGNOSIS — Z86718 Personal history of other venous thrombosis and embolism: Secondary | ICD-10-CM | POA: Diagnosis not present

## 2020-06-16 DIAGNOSIS — G9389 Other specified disorders of brain: Secondary | ICD-10-CM | POA: Diagnosis not present

## 2020-06-16 MED ORDER — IOPAMIDOL (ISOVUE-370) INJECTION 76%
75.0000 mL | Freq: Once | INTRAVENOUS | Status: AC | PRN
  Administered 2020-06-16: 75 mL via INTRAVENOUS

## 2020-06-16 NOTE — Patient Instructions (Signed)
1.  We will order a CT of the brain.  If imaging is negative, it may be primary stabbing headache, in which case I would increase melatonin to 9mg  at bedtime (you can increase further to 12mg  if needed).  If that is not effective, I would consider starting gabapentin.

## 2020-06-16 NOTE — Telephone Encounter (Signed)
-----   Message from Pieter Partridge, DO sent at 06/16/2020  3:35 PM EST ----- CT shows evidence of the prior blood clots but no new findings such as bleeding, stroke, aneurysm or tumor.

## 2020-06-16 NOTE — Telephone Encounter (Signed)
Called patient and informed him of results. Patient had no questions or concerns. 

## 2020-06-19 ENCOUNTER — Encounter: Payer: Medicare Other | Admitting: Physical Therapy

## 2020-06-20 ENCOUNTER — Ambulatory Visit (INDEPENDENT_AMBULATORY_CARE_PROVIDER_SITE_OTHER): Payer: Medicare Other | Admitting: Physical Therapy

## 2020-06-20 ENCOUNTER — Other Ambulatory Visit: Payer: Self-pay

## 2020-06-20 DIAGNOSIS — M6281 Muscle weakness (generalized): Secondary | ICD-10-CM

## 2020-06-20 DIAGNOSIS — G8929 Other chronic pain: Secondary | ICD-10-CM

## 2020-06-20 DIAGNOSIS — I69154 Hemiplegia and hemiparesis following nontraumatic intracerebral hemorrhage affecting left non-dominant side: Secondary | ICD-10-CM

## 2020-06-20 DIAGNOSIS — R29818 Other symptoms and signs involving the nervous system: Secondary | ICD-10-CM

## 2020-06-20 DIAGNOSIS — M545 Low back pain, unspecified: Secondary | ICD-10-CM | POA: Diagnosis not present

## 2020-06-20 DIAGNOSIS — R2681 Unsteadiness on feet: Secondary | ICD-10-CM | POA: Diagnosis not present

## 2020-06-20 NOTE — Therapy (Signed)
[place], [place], [address] Phone: [phone]   Fax:  [phone]  Physical Therapy Treatment/Recert  Patient Details  Name: Justin Adams MRN: [id number] Date of Birth: Mar 11, 1946 Referring Provider (PT): Stoneking MD   Encounter Date: 06/20/2020   PT End of Session - 06/20/20 0855    Visit Number 13    Number of Visits 20    Date for PT Re-Evaluation 08/15/20    Authorization Type MCR, needs KX    PT Start Time 0800    PT Stop Time 0843    PT Time Calculation (min) 43 min    Activity Tolerance Patient tolerated treatment well    Behavior During Therapy [facility] for tasks assessed/performed           Past Medical History:  Diagnosis Date  . Adenomatous polyp   . Bleeding internal hemorrhoids 10/2017  . Cerebral venous thrombosis   . Grade I diastolic dysfunction 82/42/3536   noted on ECHO   . Hemorrhoids   . History of kidney stones   . Hypercholesteremia   . Hypertension   . Intracranial hemorrhage (HCC)    Right temporal lobe  . LVH (left ventricular hypertrophy) 07/11/2017   Mild, noted on ECHO   . Muscular degeneration   . Nephrolithiasis   . Obesity   . OSA (obstructive sleep apnea)   . Polycythemia   . Prostate cancer ([place]) 2003  . Shingles   . Stroke ([place]) 06/2017   No residual  . Unsteady gait   . Ureteral calculi 02/12/2008   Left    Past Surgical History:  Procedure Laterality Date  . APPENDECTOMY     childhood  . CHOLECYSTECTOMY    . COLONOSCOPY    . HEMORRHOID BANDING  2015  . HOLMIUM LASER APPLICATION Left 14/09/3152   Procedure: HOLMIUM LASER APPLICATION;  Surgeon: Franchot Gallo, MD;  Location: [facility];  Service: Urology;  Laterality: Left;  . IR ANGIO EXTERNAL CAROTID SEL EXT CAROTID UNI R MOD SED  07/10/2017  . IR ANGIO INTRA EXTRACRAN SEL INTERNAL CAROTID BILAT MOD SED  07/10/2017  . IR ANGIO VERTEBRAL SEL VERTEBRAL BILAT MOD SED  07/10/2017  . IR URETERAL STENT LEFT  NEW ACCESS W/O SEP NEPHROSTOMY CATH  03/12/2018  . LEFT HEART CATHETERIZATION WITH CORONARY ANGIOGRAM N/A 03/15/2013   Procedure: LEFT HEART CATHETERIZATION WITH CORONARY ANGIOGRAM;  Surgeon: Minus Breeding, MD;  Location: [facility] CATH LAB;  Service: Cardiovascular;  Laterality: N/A;  . NEPHROLITHOTOMY Left 03/12/2018   Procedure: LEFT NEPHROLITHOTOMY PERCUTANEOUS, STENT PLACEMENT;  Surgeon: Franchot Gallo, MD;  Location: [facility];  Service: Urology;  Laterality: Left;  . PROSTATECTOMY    . STONE EXTRACTION WITH BASKET      There were no vitals filed for this visit.   Subjective Assessment - 06/20/20 0822    Subjective relays he is having jabbing headaches and went to see neurologist for this, he had CT scan which was negative for acute changes. He feels he should continue PT    Limitations Lifting;House hold activities    Patient Stated Goals reduce pain, improve balance    Pain Onset More than a month ago              [facility] PT Assessment - 06/20/20 0001      Assessment   Medical Diagnosis LBP, balance, general weakness    Referring Provider (PT) Stoneking MD    Next MD Visit end of Jan      Single  Leg Stance   Comments 11 sec average at best      AROM   Lumbar Flexion 75%    Lumbar Extension 25%    Lumbar - Right Side Bend 60%    Lumbar - Left Side Bend 60%    Lumbar - Right Rotation 60%    Lumbar - Left Rotation 60%      Strength   Right Hip Flexion 4+/5    Right Hip ABduction 4+/5    Left Hip Flexion 4+/5    Left Hip ABduction 4+/5    Right Knee Flexion 5/5    Right Knee Extension 5/5    Left Knee Flexion 5/5    Left Knee Extension 5/5      Transfers   Five time sit to stand comments  14.99 using UE to push up      6 minute walk test results    Aerobic Endurance Distance Walked 1320                         [facility] Adult PT Treatment/Exercise - 06/20/20 0001      Neuro Re-ed    Neuro Re-ed Details  tandem walk, walking with head turns and head  nods, retro walking, SLS      Lumbar Exercises: Stretches   Double Knee to Chest Stretch Limitations 5 reps 10 seconds with feet on pball    Lower Trunk Rotation 5 reps;10 seconds    Other Lumbar Stretch Exercise seated pball roll outs 10 sec X 10 for lumbar flexion       Lumbar Exercises: Aerobic   Recumbent Bike L3 X 8 min      Lumbar Exercises: Standing   Other Standing Lumbar Exercises rows and extensions green 2X15 ea      Lumbar Exercises: Seated   Sit to Stand Limitations 2 sets of 10 reps on UE support, 24 inch      Lumbar Exercises: Supine   Bridge 5 seconds;15 reps    Bridge Limitations feet on pball    Straight Leg Raise 10 reps                    PT Short Term Goals - 06/01/20 0904      PT SHORT TERM GOAL #1   Title Pt will report performing walking program, HEP and [employer] 2-3 days/week    Baseline relays he averages 2 times per week on 12/23    Time 4    Period Weeks    Status Achieved    Target Date 04/20/20      PT SHORT TERM GOAL #2   Title Pt will improve functional LE strength decreasing five time sit to stand to </= 15 seconds    Baseline 14.99 on 12/23    Time 4    Period Weeks    Status Achieved             PT Long Term Goals - 06/20/20 0857      PT LONG TERM GOAL #1   Title Pt will improve 6 minute walk test by 160 ft(one extra lap) to show improved endurance and gait speed.    Baseline improved 168 ft so 1320 ft total on 12/23    Time 12    Period Weeks    Status Achieved      PT LONG TERM GOAL #2   Title Pt will increaese overall leg strength to at least 4+ to  5- overall to improve function    Baseline now 4+    Time 12    Period Weeks    Status On-going      PT LONG TERM GOAL #3   Title Pt will improve 5 times sit to stand test to less than 13 seconds    Baseline 14.99 on 12/23    Time 12    Period Weeks    Status On-going      PT LONG TERM GOAL #4   Title -      PT LONG TERM GOAL #5   Title -                  Plan - 06/20/20 0856    Clinical Impression Statement Recert today as he is at the end of his POC. He has progressed well with PT but would continue to benefit from skilled PT for strength, balance, and endurance.    Personal Factors and Comorbidities Comorbidity 3+    Comorbidities CVA, sleep apnea, chronic LBP, history of falls    Examination-Activity Limitations Carry;Bend;Bed Mobility;Dressing;Lift;Stand;Stairs;Squat;Sleep;Locomotion Level    Examination-Participation Restrictions Cleaning;Community Activity;Driving;Laundry;Yard Work;Volunteer    Stability/Clinical Decision Making Evolving/Moderate complexity    Rehab Potential Good    PT Frequency 2x / week   1-2   PT Duration 12 weeks    PT Treatment/Interventions ADLs/Self Care Home Management;Cryotherapy;Electrical Stimulation;Moist Heat;Traction;Gait Scientist, forensic;Therapeutic activities;Therapeutic exercise;Balance training;Neuromuscular re-education;Manual techniques;Passive range of motion;Dry needling;Joint Manipulations;Spinal Manipulations;Taping    PT Next Visit Plan balance, leg strength, endurance, pball core    PT Home Exercise Plan tandem balance, SLS, squats, sit to stands    Consulted and Agree with Plan of Care Patient           Patient will benefit from skilled therapeutic intervention in order to improve the following deficits and impairments:  Decreased activity tolerance,Decreased balance,Decreased endurance,Decreased mobility,Decreased range of motion,Decreased strength,Difficulty walking,Increased muscle spasms,Impaired flexibility,Postural dysfunction,Improper body mechanics,Pain  Visit Diagnosis: Unsteadiness on feet - Plan: PT plan of care cert/re-cert  Muscle weakness (generalized) - Plan: PT plan of care cert/re-cert  Chronic bilateral low back pain without sciatica - Plan: PT plan of care cert/re-cert  Hemiplegia and hemiparesis following nontraumatic intracerebral hemorrhage  affecting left non-dominant side (HCC) - Plan: PT plan of care cert/re-cert  Other symptoms and signs involving the nervous system - Plan: PT plan of care cert/re-cert     Problem List Patient Active Problem List   Diagnosis Date Noted  . Intermediate stage nonexudative age-related macular degeneration of right eye 05/08/2020  . Exudative age-related macular degeneration of left eye with active choroidal neovascularization ([place]) 10/11/2019  . Intermediate stage nonexudative age-related macular degeneration of both eyes 10/11/2019  . Operculated retinal tear of left eye 10/11/2019  . Right posterior capsular opacification 10/11/2019  . Posterior vitreous detachment of left eye 10/11/2019  . Paving stone retinal degeneration of right eye 10/11/2019  . Calculus of kidney 03/12/2018  . Bleeding internal hemorrhoids 10/09/2017  . Sleep apnea 09/22/2017  . History of adenomatous polyp of colon 09/18/2017  . Prostate cancer ([place]) 09/18/2017  . Essential hypertension 08/26/2017  . Hyperlipidemia 08/26/2017  . Cerebral venous thrombosis 07/13/2017  . Intracerebral hemorrhage 07/10/2017  . ICH (intracerebral hemorrhage) ([place]) 07/10/2017  . Unstable angina ([place]) 03/12/2013    Silvestre Mesi 06/20/2020, 9:00 AM  [facility] Physical Therapy [address] [place], [place], [address] Phone: [phone]   Fax:  [phone]  Name: MELCHIZEDEK LEACHMAN MRN: [id number] Date  of Birth: [birth year]

## 2020-06-21 ENCOUNTER — Encounter: Payer: Medicare Other | Admitting: Physical Therapy

## 2020-06-21 ENCOUNTER — Ambulatory Visit (INDEPENDENT_AMBULATORY_CARE_PROVIDER_SITE_OTHER): Payer: Medicare Other | Admitting: Podiatry

## 2020-06-21 DIAGNOSIS — M79675 Pain in left toe(s): Secondary | ICD-10-CM

## 2020-06-21 DIAGNOSIS — B351 Tinea unguium: Secondary | ICD-10-CM | POA: Diagnosis not present

## 2020-06-21 DIAGNOSIS — M79674 Pain in right toe(s): Secondary | ICD-10-CM | POA: Diagnosis not present

## 2020-06-21 NOTE — Progress Notes (Signed)
   SUBJECTIVE Patient presents to office today complaining of elongated, thickened nails that cause pain while ambulating in shoes.  He is unable to trim his own nails. Patient is here for further evaluation and treatment.  Past Medical History:  Diagnosis Date  . Adenomatous polyp   . Bleeding internal hemorrhoids 10/2017  . Cerebral venous thrombosis   . Grade I diastolic dysfunction 07/11/2017   noted on ECHO   . Hemorrhoids   . History of kidney stones   . Hypercholesteremia   . Hypertension   . Intracranial hemorrhage (HCC)    Right temporal lobe  . LVH (left ventricular hypertrophy) 07/11/2017   Mild, noted on ECHO   . Muscular degeneration   . Nephrolithiasis   . Obesity   . OSA (obstructive sleep apnea)   . Polycythemia   . Prostate cancer (HCC) 2003  . Shingles   . Stroke (HCC) 06/2017   No residual  . Unsteady gait   . Ureteral calculi 02/12/2008   Left    OBJECTIVE General Patient is awake, alert, and oriented x 3 and in no acute distress. Derm Skin is dry and supple bilateral. Negative open lesions or macerations. Remaining integument unremarkable. Nails are tender, long, thickened and dystrophic with subungual debris, consistent with onychomycosis, 1-5 bilateral. No signs of infection noted. Vasc  DP and PT pedal pulses palpable bilaterally. Temperature gradient within normal limits.  Neuro Epicritic and protective threshold sensation grossly intact bilaterally.  Musculoskeletal Exam No symptomatic pedal deformities noted bilateral. Muscular strength within normal limits.  ASSESSMENT 1. Onychodystrophic nails 1-5 bilateral with hyperkeratosis of nails.  2. Onychomycosis of nail due to dermatophyte bilateral 3. Pain in foot bilateral  PLAN OF CARE 1. Patient evaluated today.  2. Instructed to maintain good pedal hygiene and foot care.  3. Mechanical debridement of nails 1-5 bilaterally performed using a nail nipper. Filed with dremel without incident.  4.  Return to clinic in 3 mos.    Justin Adams M. Dhilan Brauer, DPM [facility]  Dr. Islam Eichinger M. Nhyira Leano, DPM    [address]                                        [place], [place] [address]                Office [phone]  Fax [phone]     

## 2020-06-22 ENCOUNTER — Ambulatory Visit (INDEPENDENT_AMBULATORY_CARE_PROVIDER_SITE_OTHER): Payer: Medicare Other | Admitting: Physical Therapy

## 2020-06-22 ENCOUNTER — Other Ambulatory Visit: Payer: Self-pay

## 2020-06-22 DIAGNOSIS — M545 Low back pain, unspecified: Secondary | ICD-10-CM | POA: Diagnosis not present

## 2020-06-22 DIAGNOSIS — M6281 Muscle weakness (generalized): Secondary | ICD-10-CM

## 2020-06-22 DIAGNOSIS — R29818 Other symptoms and signs involving the nervous system: Secondary | ICD-10-CM

## 2020-06-22 DIAGNOSIS — I69154 Hemiplegia and hemiparesis following nontraumatic intracerebral hemorrhage affecting left non-dominant side: Secondary | ICD-10-CM

## 2020-06-22 DIAGNOSIS — R2681 Unsteadiness on feet: Secondary | ICD-10-CM | POA: Diagnosis not present

## 2020-06-22 DIAGNOSIS — G8929 Other chronic pain: Secondary | ICD-10-CM

## 2020-06-22 NOTE — Therapy (Signed)
[place], [place], [address] Phone: [phone]   Fax:  [phone]  Physical Therapy Treatment  Patient Details  Name: Justin Adams MRN: [id number] Date of Birth: Apr 24, 1946 Referring Provider (PT): Stoneking MD   Encounter Date: 06/22/2020   PT End of Session - 06/22/20 0844    Visit Number 14    Number of Visits 20    Date for PT Re-Evaluation 08/15/20    Authorization Type MCR, needs KX    PT Start Time 0804    PT Stop Time 0848    PT Time Calculation (min) 44 min    Activity Tolerance Patient tolerated treatment well    Behavior During Therapy [facility] for tasks assessed/performed           Past Medical History:  Diagnosis Date  . Adenomatous polyp   . Bleeding internal hemorrhoids 10/2017  . Cerebral venous thrombosis   . Grade I diastolic dysfunction 0000000   noted on ECHO   . Hemorrhoids   . History of kidney stones   . Hypercholesteremia   . Hypertension   . Intracranial hemorrhage (HCC)    Right temporal lobe  . LVH (left ventricular hypertrophy) 07/11/2017   Mild, noted on ECHO   . Muscular degeneration   . Nephrolithiasis   . Obesity   . OSA (obstructive sleep apnea)   . Polycythemia   . Prostate cancer ([place]) 2003  . Shingles   . Stroke ([place]) 06/2017   No residual  . Unsteady gait   . Ureteral calculi 02/12/2008   Left    Past Surgical History:  Procedure Laterality Date  . APPENDECTOMY     childhood  . CHOLECYSTECTOMY    . COLONOSCOPY    . HEMORRHOID BANDING  2015  . HOLMIUM LASER APPLICATION Left [id number]   Procedure: HOLMIUM LASER APPLICATION;  Surgeon: Franchot Gallo, MD;  Location: [facility];  Service: Urology;  Laterality: Left;  . IR ANGIO EXTERNAL CAROTID SEL EXT CAROTID UNI R MOD SED  07/10/2017  . IR ANGIO INTRA EXTRACRAN SEL INTERNAL CAROTID BILAT MOD SED  07/10/2017  . IR ANGIO VERTEBRAL SEL VERTEBRAL BILAT MOD SED  07/10/2017  . IR URETERAL STENT LEFT NEW  ACCESS W/O SEP NEPHROSTOMY CATH  03/12/2018  . LEFT HEART CATHETERIZATION WITH CORONARY ANGIOGRAM N/A 03/15/2013   Procedure: LEFT HEART CATHETERIZATION WITH CORONARY ANGIOGRAM;  Surgeon: Minus Breeding, MD;  Location: [facility] CATH LAB;  Service: Cardiovascular;  Laterality: N/A;  . NEPHROLITHOTOMY Left 03/12/2018   Procedure: LEFT NEPHROLITHOTOMY PERCUTANEOUS, STENT PLACEMENT;  Surgeon: Franchot Gallo, MD;  Location: [facility];  Service: Urology;  Laterality: Left;  . PROSTATECTOMY    . STONE EXTRACTION WITH BASKET      There were no vitals filed for this visit.   Subjective Assessment - 06/22/20 0819    Subjective relays he actually feels pretty good today, but he is concerned about snow and ice that is forcasted for this weekend as he has to walk his dog    Limitations Lifting;House hold activities    Patient Stated Goals reduce pain, improve balance    Pain Onset More than a month ago            [facility] Adult PT Treatment/Exercise - 06/22/20 0001      Neuro Re-ed    Neuro Re-ed Details  tandem walk and sidestepping on foam beam, SLS      Lumbar Exercises: Stretches   Double Knee to Chest Stretch Limitations 5  reps 10 seconds with feet on pball    Lower Trunk Rotation 5 reps;10 seconds    Other Lumbar Stretch Exercise seated pball roll outs 10 sec X 10 for lumbar flexion       Lumbar Exercises: Aerobic   Recumbent Bike L3 X 8 min      Lumbar Exercises: Machines for Strengthening   Leg Press 100 lbs 3 sets of 12      Lumbar Exercises: Standing   Other Standing Lumbar Exercises rows and extensions green 2X15 ea      Lumbar Exercises: Seated   Sit to Stand Limitations 2 sets of 10 reps on UE support, 24 inch      Lumbar Exercises: Supine   Bridge 5 seconds;15 reps    Bridge Limitations feet on pball    Straight Leg Raise 10 reps                    PT Short Term Goals - 06/01/20 0904      PT SHORT TERM GOAL #1   Title Pt will report performing walking program, HEP  and [employer] 2-3 days/week    Baseline relays he averages 2 times per week on 12/23    Time 4    Period Weeks    Status Achieved    Target Date 04/20/20      PT SHORT TERM GOAL #2   Title Pt will improve functional LE strength decreasing five time sit to stand to </= 15 seconds    Baseline 14.99 on 12/23    Time 4    Period Weeks    Status Achieved             PT Long Term Goals - 06/20/20 0857      PT LONG TERM GOAL #1   Title Pt will improve 6 minute walk test by 160 ft(one extra lap) to show improved endurance and gait speed.    Baseline improved 168 ft so 1320 ft total on 12/23    Time 12    Period Weeks    Status Achieved      PT LONG TERM GOAL #2   Title Pt will increaese overall leg strength to at least 4+ to 5- overall to improve function    Baseline now 4+    Time 12    Period Weeks    Status On-going      PT LONG TERM GOAL #3   Title Pt will improve 5 times sit to stand test to less than 13 seconds    Baseline 14.99 on 12/23    Time 12    Period Weeks    Status On-going      PT LONG TERM GOAL #4   Title -      PT LONG TERM GOAL #5   Title -                 Plan - 06/22/20 0845    Clinical Impression Statement He had good overall tolerance with no pain reported with his strengthening, endurance, and balance activiaties today. Overall is progressing well. Continue POC    Personal Factors and Comorbidities Comorbidity 3+    Comorbidities CVA, sleep apnea, chronic LBP, history of falls    Examination-Activity Limitations Carry;Bend;Bed Mobility;Dressing;Lift;Stand;Stairs;Squat;Sleep;Locomotion Level    Examination-Participation Restrictions Cleaning;Community Activity;Driving;Laundry;Yard Work;Volunteer    Stability/Clinical Decision Making Evolving/Moderate complexity    Rehab Potential Good    PT Frequency 2x / week   1-2  PT Duration 12 weeks    PT Treatment/Interventions ADLs/Self Care Home Management;Cryotherapy;Electrical  Stimulation;Moist Heat;Traction;Gait Scientist, forensic;Therapeutic activities;Therapeutic exercise;Balance training;Neuromuscular re-education;Manual techniques;Passive range of motion;Dry needling;Joint Manipulations;Spinal Manipulations;Taping    PT Next Visit Plan balance, leg strength, endurance, pball core    PT Home Exercise Plan tandem balance, SLS, squats, sit to stands    Consulted and Agree with Plan of Care Patient           Patient will benefit from skilled therapeutic intervention in order to improve the following deficits and impairments:  Decreased activity tolerance,Decreased balance,Decreased endurance,Decreased mobility,Decreased range of motion,Decreased strength,Difficulty walking,Increased muscle spasms,Impaired flexibility,Postural dysfunction,Improper body mechanics,Pain  Visit Diagnosis: Unsteadiness on feet  Muscle weakness (generalized)  Chronic bilateral low back pain without sciatica  Hemiplegia and hemiparesis following nontraumatic intracerebral hemorrhage affecting left non-dominant side (HCC)  Other symptoms and signs involving the nervous system     Problem List Patient Active Problem List   Diagnosis Date Noted  . Intermediate stage nonexudative age-related macular degeneration of right eye 05/08/2020  . Exudative age-related macular degeneration of left eye with active choroidal neovascularization ([place]) 10/11/2019  . Intermediate stage nonexudative age-related macular degeneration of both eyes 10/11/2019  . Operculated retinal tear of left eye 10/11/2019  . Right posterior capsular opacification 10/11/2019  . Posterior vitreous detachment of left eye 10/11/2019  . Paving stone retinal degeneration of right eye 10/11/2019  . Calculus of kidney 03/12/2018  . Bleeding internal hemorrhoids 10/09/2017  . Sleep apnea 09/22/2017  . History of adenomatous polyp of colon 09/18/2017  . Prostate cancer ([place]) 09/18/2017  . Essential hypertension  08/26/2017  . Hyperlipidemia 08/26/2017  . Cerebral venous thrombosis 07/13/2017  . Intracerebral hemorrhage 07/10/2017  . ICH (intracerebral hemorrhage) ([place]) 07/10/2017  . Unstable angina [facility]) 03/12/2013    Silvestre Mesi 06/22/2020, 8:54 AM  [facility] Physical Therapy [address] [place], [place], [address] Phone: [phone]   Fax:  [phone]  Name: Justin Adams MRN: [id number] Date of Birth: [birth year]

## 2020-06-26 ENCOUNTER — Encounter: Payer: Medicare Other | Admitting: Physical Therapy

## 2020-06-27 ENCOUNTER — Encounter: Payer: Medicare Other | Admitting: Physical Therapy

## 2020-06-28 ENCOUNTER — Encounter: Payer: Medicare Other | Admitting: Physical Therapy

## 2020-06-29 ENCOUNTER — Ambulatory Visit (INDEPENDENT_AMBULATORY_CARE_PROVIDER_SITE_OTHER): Payer: Medicare Other | Admitting: Physical Therapy

## 2020-06-29 ENCOUNTER — Other Ambulatory Visit: Payer: Self-pay

## 2020-06-29 ENCOUNTER — Encounter: Payer: Self-pay | Admitting: Physical Therapy

## 2020-06-29 DIAGNOSIS — R29818 Other symptoms and signs involving the nervous system: Secondary | ICD-10-CM

## 2020-06-29 DIAGNOSIS — M545 Low back pain, unspecified: Secondary | ICD-10-CM

## 2020-06-29 DIAGNOSIS — R2681 Unsteadiness on feet: Secondary | ICD-10-CM | POA: Diagnosis not present

## 2020-06-29 DIAGNOSIS — M6281 Muscle weakness (generalized): Secondary | ICD-10-CM | POA: Diagnosis not present

## 2020-06-29 DIAGNOSIS — I69154 Hemiplegia and hemiparesis following nontraumatic intracerebral hemorrhage affecting left non-dominant side: Secondary | ICD-10-CM | POA: Diagnosis not present

## 2020-06-29 DIAGNOSIS — G8929 Other chronic pain: Secondary | ICD-10-CM

## 2020-06-29 NOTE — Therapy (Signed)
[facility] Physical Therapy [address] [place], [place], [address] Phone: [phone]   Fax:  [phone]  Physical Therapy Treatment/Progress note Progress Note reporting period 06/01/21 to 06/29/20  See below for objective and subjective measurements relating to patients progress with PT.   Patient Details  Name: Justin Adams MRN: [id number] Date of Birth: [birth year] Referring Provider (PT): Stoneking MD   Encounter Date: 06/29/2020   PT End of Session - 06/29/20 0847    Visit Number 15    Number of Visits 20    Date for PT Re-Evaluation 08/15/20    Authorization Type MCR, needs KX    Progress Note Due on Visit 25    PT Start Time 0800    PT Stop Time 0844    PT Time Calculation (min) 44 min    Activity Tolerance Patient tolerated treatment well    Behavior During Therapy [facility] for tasks assessed/performed           Past Medical History:  Diagnosis Date  . Adenomatous polyp   . Bleeding internal hemorrhoids 10/2017  . Cerebral venous thrombosis   . Grade I diastolic dysfunction 67/05/4579   noted on ECHO   . Hemorrhoids   . History of kidney stones   . Hypercholesteremia   . Hypertension   . Intracranial hemorrhage (HCC)    Right temporal lobe  . LVH (left ventricular hypertrophy) 07/11/2017   Mild, noted on ECHO   . Muscular degeneration   . Nephrolithiasis   . Obesity   . OSA (obstructive sleep apnea)   . Polycythemia   . Prostate cancer ([place]) 2003  . Shingles   . Stroke ([place]) 06/2017   No residual  . Unsteady gait   . Ureteral calculi 02/12/2008   Left    Past Surgical History:  Procedure Laterality Date  . APPENDECTOMY     childhood  . CHOLECYSTECTOMY    . COLONOSCOPY    . HEMORRHOID BANDING  2015  . HOLMIUM LASER APPLICATION Left 99/01/3381   Procedure: HOLMIUM LASER APPLICATION;  Surgeon: Franchot Gallo, MD;  Location: [facility];  Service: Urology;  Laterality: Left;  . IR ANGIO EXTERNAL CAROTID SEL EXT CAROTID  UNI R MOD SED  07/10/2017  . IR ANGIO INTRA EXTRACRAN SEL INTERNAL CAROTID BILAT MOD SED  07/10/2017  . IR ANGIO VERTEBRAL SEL VERTEBRAL BILAT MOD SED  07/10/2017  . IR URETERAL STENT LEFT NEW ACCESS W/O SEP NEPHROSTOMY CATH  03/12/2018  . LEFT HEART CATHETERIZATION WITH CORONARY ANGIOGRAM N/A 03/15/2013   Procedure: LEFT HEART CATHETERIZATION WITH CORONARY ANGIOGRAM;  Surgeon: Minus Breeding, MD;  Location: [facility] CATH LAB;  Service: Cardiovascular;  Laterality: N/A;  . NEPHROLITHOTOMY Left 03/12/2018   Procedure: LEFT NEPHROLITHOTOMY PERCUTANEOUS, STENT PLACEMENT;  Surgeon: Franchot Gallo, MD;  Location: [facility];  Service: Urology;  Laterality: Left;  . PROSTATECTOMY    . STONE EXTRACTION WITH BASKET      There were no vitals filed for this visit.   Subjective Assessment - 06/29/20 0810    Subjective relays his biggest complaint is back pain after standing about 15 minutes    Limitations Lifting;House hold activities    Patient Stated Goals reduce pain, improve balance    Pain Onset More than a month ago              [facility] PT Assessment - 06/29/20 0001      Assessment   Medical Diagnosis LBP, balance, general weakness    Referring Provider (PT) Stoneking MD  Single Leg Stance   Comments 20 sec average at best      Transfers   Five time sit to stand comments  12.3 sec using UE to push up from chair                         [facility] Adult PT Treatment/Exercise - 06/29/20 0001      Neuro Re-ed    Neuro Re-ed Details  tandem walk and sidestepping on foam beam, SLS 20 sec X 3 bilat      Lumbar Exercises: Stretches   Double Knee to Chest Stretch Limitations 5 reps 10 seconds with feet on pball    Lower Trunk Rotation 5 reps;10 seconds    Other Lumbar Stretch Exercise seated pball roll outs 10 sec X 10 for lumbar flexion       Lumbar Exercises: Aerobic   Recumbent Bike L3 X 8 min      Lumbar Exercises: Standing   Other Standing Lumbar Exercises suitcase carry 10  lbs one lap ea side, anti rotation walkout green band  x10    Other Standing Lumbar Exercises TRX rows, squats, mini lunges 2X10 ea      Lumbar Exercises: Supine   Bridge 5 seconds;15 reps    Bridge Limitations feet on pball                    PT Short Term Goals - 06/01/20 0904      PT SHORT TERM GOAL #1   Title Pt will report performing walking program, HEP and [employer] 2-3 days/week    Baseline relays he averages 2 times per week on 12/23    Time 4    Period Weeks    Status Achieved    Target Date 04/20/20      PT SHORT TERM GOAL #2   Title Pt will improve functional LE strength decreasing five time sit to stand to </= 15 seconds    Baseline 14.99 on 12/23    Time 4    Period Weeks    Status Achieved             PT Long Term Goals - 06/29/20 2542      PT LONG TERM GOAL #1   Title Pt will improve 6 minute walk test by 160 ft(one extra lap) to show improved endurance and gait speed.    Baseline improved 168 ft so 1320 ft total on 12/23    Time 12    Period Weeks    Status Achieved      PT LONG TERM GOAL #2   Title Pt will increaese overall leg strength to at least 4+ to 5- overall to improve function    Baseline now 4+    Time 12    Period Weeks    Status On-going      PT LONG TERM GOAL #3   Title Pt will improve 5 times sit to stand test to less than 13 seconds    Baseline 14.99 on 12/23    Time 12    Period Weeks    Status Achieved      PT LONG TERM GOAL #4   Title Pt will be able SLS goal to 30 sec to show improved balance    Baseline 20 sec on 1/20    Status New      PT LONG TERM GOAL #5   Title -  Plan - 06/29/20 0848    Clinical Impression Statement MD progress note shows some improvements in overall balance, leg strength and endurance. He is still limited some with chronic back pain that is aggravated with prolonged standing >15 minutes and PT will continue to work to improve his overall standing tolerance,  balance, and core/lumbar strength.    Personal Factors and Comorbidities Comorbidity 3+    Comorbidities CVA, sleep apnea, chronic LBP, history of falls    Examination-Activity Limitations Carry;Bend;Bed Mobility;Dressing;Lift;Stand;Stairs;Squat;Sleep;Locomotion Level    Examination-Participation Restrictions Cleaning;Community Activity;Driving;Laundry;Yard Work;Volunteer    Stability/Clinical Decision Making Evolving/Moderate complexity    Rehab Potential Good    PT Frequency 2x / week   1-2   PT Duration 12 weeks    PT Treatment/Interventions ADLs/Self Care Home Management;Cryotherapy;Electrical Stimulation;Moist Heat;Traction;Gait Scientist, forensic;Therapeutic activities;Therapeutic exercise;Balance training;Neuromuscular re-education;Manual techniques;Passive range of motion;Dry needling;Joint Manipulations;Spinal Manipulations;Taping    PT Next Visit Plan balance, leg strength, endurance, pball core    PT Home Exercise Plan tandem balance, SLS, squats, sit to stands    Consulted and Agree with Plan of Care Patient           Patient will benefit from skilled therapeutic intervention in order to improve the following deficits and impairments:  Decreased activity tolerance,Decreased balance,Decreased endurance,Decreased mobility,Decreased range of motion,Decreased strength,Difficulty walking,Increased muscle spasms,Impaired flexibility,Postural dysfunction,Improper body mechanics,Pain  Visit Diagnosis: Unsteadiness on feet  Muscle weakness (generalized)  Chronic bilateral low back pain without sciatica  Hemiplegia and hemiparesis following nontraumatic intracerebral hemorrhage affecting left non-dominant side (HCC)  Other symptoms and signs involving the nervous system     Problem List Patient Active Problem List   Diagnosis Date Noted  . Intermediate stage nonexudative age-related macular degeneration of right eye 05/08/2020  . Exudative age-related macular degeneration  of left eye with active choroidal neovascularization ([place]) 10/11/2019  . Intermediate stage nonexudative age-related macular degeneration of both eyes 10/11/2019  . Operculated retinal tear of left eye 10/11/2019  . Right posterior capsular opacification 10/11/2019  . Posterior vitreous detachment of left eye 10/11/2019  . Paving stone retinal degeneration of right eye 10/11/2019  . Calculus of kidney 03/12/2018  . Bleeding internal hemorrhoids 10/09/2017  . Sleep apnea 09/22/2017  . History of adenomatous polyp of colon 09/18/2017  . Prostate cancer ([place]) 09/18/2017  . Essential hypertension 08/26/2017  . Hyperlipidemia 08/26/2017  . Cerebral venous thrombosis 07/13/2017  . Intracerebral hemorrhage 07/10/2017  . ICH (intracerebral hemorrhage) ([place]) 07/10/2017  . Unstable angina [facility]) 03/12/2013    Silvestre Mesi 06/29/2020, 8:56 AM  [facility] Physical Therapy [address] [place], [place], [address] Phone: [phone]   Fax:  [phone]  Name: Justin Adams MRN: [id number] Date of Birth: 1945-08-24

## 2020-07-03 ENCOUNTER — Encounter (INDEPENDENT_AMBULATORY_CARE_PROVIDER_SITE_OTHER): Payer: Medicare Other | Admitting: Ophthalmology

## 2020-07-04 ENCOUNTER — Ambulatory Visit (INDEPENDENT_AMBULATORY_CARE_PROVIDER_SITE_OTHER): Payer: Medicare Other | Admitting: Physical Therapy

## 2020-07-04 ENCOUNTER — Other Ambulatory Visit: Payer: Self-pay

## 2020-07-04 DIAGNOSIS — M545 Low back pain, unspecified: Secondary | ICD-10-CM

## 2020-07-04 DIAGNOSIS — I69154 Hemiplegia and hemiparesis following nontraumatic intracerebral hemorrhage affecting left non-dominant side: Secondary | ICD-10-CM | POA: Diagnosis not present

## 2020-07-04 DIAGNOSIS — G4452 New daily persistent headache (NDPH): Secondary | ICD-10-CM | POA: Diagnosis not present

## 2020-07-04 DIAGNOSIS — R2681 Unsteadiness on feet: Secondary | ICD-10-CM

## 2020-07-04 DIAGNOSIS — G8929 Other chronic pain: Secondary | ICD-10-CM | POA: Diagnosis not present

## 2020-07-04 DIAGNOSIS — I10 Essential (primary) hypertension: Secondary | ICD-10-CM | POA: Diagnosis not present

## 2020-07-04 DIAGNOSIS — R29818 Other symptoms and signs involving the nervous system: Secondary | ICD-10-CM | POA: Diagnosis not present

## 2020-07-04 DIAGNOSIS — M6281 Muscle weakness (generalized): Secondary | ICD-10-CM

## 2020-07-04 NOTE — Therapy (Signed)
Justin Adams, [place], [address] Phone: [phone]   Fax:  [phone]  Physical Therapy Treatment  Patient Details  Name: Justin Adams MRN: [id number] Date of Birth: 02/11/1946 Referring Provider (PT): Stoneking MD   Encounter Date: 07/04/2020   PT End of Session - 07/04/20 0859    Visit Number 16    Number of Visits 20    Date for PT Re-Evaluation 08/15/20    Authorization Type MCR, needs starting visit 12    Progress Note Due on Visit 25    PT Start Time 0800    PT Stop Time 0844    PT Time Calculation (min) 44 min    Activity Tolerance Patient tolerated treatment well    Behavior During Therapy [facility] for tasks assessed/performed           Past Medical History:  Diagnosis Date  . Adenomatous polyp   . Bleeding internal hemorrhoids 10/2017  . Cerebral venous thrombosis   . Grade I diastolic dysfunction 83/15/1761   noted on ECHO   . Hemorrhoids   . History of kidney stones   . Hypercholesteremia   . Hypertension   . Intracranial hemorrhage (HCC)    Right temporal lobe  . LVH (left ventricular hypertrophy) 07/11/2017   Mild, noted on ECHO   . Muscular degeneration   . Nephrolithiasis   . Obesity   . OSA (obstructive sleep apnea)   . Polycythemia   . Prostate cancer ([place]) 2003  . Shingles   . Stroke ([place]) 06/2017   No residual  . Unsteady gait   . Ureteral calculi 02/12/2008   Left    Past Surgical History:  Procedure Laterality Date  . APPENDECTOMY     childhood  . CHOLECYSTECTOMY    . COLONOSCOPY    . HEMORRHOID BANDING  2015  . HOLMIUM LASER APPLICATION Left 60/12/3708   Procedure: HOLMIUM LASER APPLICATION;  Surgeon: Franchot Gallo, MD;  Location: [facility];  Service: Urology;  Laterality: Left;  . IR ANGIO EXTERNAL CAROTID SEL EXT CAROTID UNI R MOD SED  07/10/2017  . IR ANGIO INTRA EXTRACRAN SEL INTERNAL CAROTID BILAT MOD SED  07/10/2017  . IR ANGIO VERTEBRAL SEL VERTEBRAL BILAT MOD  SED  07/10/2017  . IR URETERAL STENT LEFT NEW ACCESS W/O SEP NEPHROSTOMY CATH  03/12/2018  . LEFT HEART CATHETERIZATION WITH CORONARY ANGIOGRAM N/A 03/15/2013   Procedure: LEFT HEART CATHETERIZATION WITH CORONARY ANGIOGRAM;  Surgeon: Minus Breeding, MD;  Location: [facility] CATH LAB;  Service: Cardiovascular;  Laterality: N/A;  . NEPHROLITHOTOMY Left 03/12/2018   Procedure: LEFT NEPHROLITHOTOMY PERCUTANEOUS, STENT PLACEMENT;  Surgeon: Franchot Gallo, MD;  Location: [facility];  Service: Urology;  Laterality: Left;  . PROSTATECTOMY    . STONE EXTRACTION WITH BASKET      There were no vitals filed for this visit.   Subjective Assessment - 07/04/20 0851    Subjective relays some soreness after last time but not bad. He has not had any falls since beginning PT    Limitations Lifting;House hold activities    Patient Stated Goals reduce pain, improve balance    Pain Onset More than a month ago             [facility] Adult PT Treatment/Exercise - 07/04/20 0001      Neuro Re-ed    Neuro Re-ed Details  tandem walk, sidestepping over cones, SLS with cone taps      Lumbar Exercises: Stretches   Double Knee to Chest  Stretch Limitations 5 reps 10 seconds with feet on pball    Lower Trunk Rotation 5 reps;10 seconds    Other Lumbar Stretch Exercise seated pball roll outs 10 sec X 10 for lumbar flexion       Lumbar Exercises: Aerobic   Recumbent Bike L3 X 8 min      Lumbar Exercises: Standing   Other Standing Lumbar Exercises suitcase carry 10 lbs one lap ea side, anti rotation walkout green band  x10    Other Standing Lumbar Exercises TRX rows, squats, mini lunges 2X10 ea      Lumbar Exercises: Seated   Sit to Stand Limitations 2 sets of 10 reps on UE support, 24 inch      Lumbar Exercises: Supine   Bridge Limitations 2 sets of 10 5 sec holdsfeet on pball    Straight Leg Raise 15 reps                    PT Short Term Goals - 06/01/20 0904      PT SHORT TERM GOAL #1   Title Pt will  report performing walking program, HEP and [employer] 2-3 days/week    Baseline relays he averages 2 times per week on 12/23    Time 4    Period Weeks    Status Achieved    Target Date 04/20/20      PT SHORT TERM GOAL #2   Title Pt will improve functional LE strength decreasing five time sit to stand to </= 15 seconds    Baseline 14.99 on 12/23    Time 4    Period Weeks    Status Achieved             PT Long Term Goals - 06/29/20 8119      PT LONG TERM GOAL #1   Title Pt will improve 6 minute walk test by 160 ft(one extra lap) to show improved endurance and gait speed.    Baseline improved 168 ft so 1320 ft total on 12/23    Time 12    Period Weeks    Status Achieved      PT LONG TERM GOAL #2   Title Pt will increaese overall leg strength to at least 4+ to 5- overall to improve function    Baseline now 4+    Time 12    Period Weeks    Status On-going      PT LONG TERM GOAL #3   Title Pt will improve 5 times sit to stand test to less than 13 seconds    Baseline 14.99 on 12/23    Time 12    Period Weeks    Status Achieved      PT LONG TERM GOAL #4   Title Pt will be able SLS goal to 30 sec to show improved balance    Baseline 20 sec on 1/20    Status New      PT LONG TERM GOAL #5   Title -                 Plan - 07/04/20 0900    Clinical Impression Statement Continued to address his deficits in overall strength, balance, endurance and posture. Balance is overall improving and he is making significant progress with PT. Continue POC    Personal Factors and Comorbidities Comorbidity 3+    Comorbidities CVA, sleep apnea, chronic LBP, history of falls    Examination-Activity Limitations Carry;Bend;Bed Mobility;Dressing;Lift;Stand;Stairs;Squat;Sleep;Locomotion Level  Examination-Participation Restrictions Cleaning;Community Activity;Driving;Laundry;Yard Work;Volunteer    Stability/Clinical Decision Making Evolving/Moderate complexity    Rehab Potential  Good    PT Frequency 2x / week   1-2   PT Duration 12 weeks    PT Treatment/Interventions ADLs/Self Care Home Management;Cryotherapy;Electrical Stimulation;Moist Heat;Traction;Gait Scientist, forensic;Therapeutic activities;Therapeutic exercise;Balance training;Neuromuscular re-education;Manual techniques;Passive range of motion;Dry needling;Joint Manipulations;Spinal Manipulations;Taping    PT Next Visit Plan balance, leg strength, endurance, pball core    PT Home Exercise Plan tandem balance, SLS, squats, sit to stands    Consulted and Agree with Plan of Care Patient           Patient will benefit from skilled therapeutic intervention in order to improve the following deficits and impairments:  Decreased activity tolerance,Decreased balance,Decreased endurance,Decreased mobility,Decreased range of motion,Decreased strength,Difficulty walking,Increased muscle spasms,Impaired flexibility,Postural dysfunction,Improper body mechanics,Pain  Visit Diagnosis: Unsteadiness on feet  Muscle weakness (generalized)  Chronic bilateral low back pain without sciatica  Hemiplegia and hemiparesis following nontraumatic intracerebral hemorrhage affecting left non-dominant side (HCC)  Other symptoms and signs involving the nervous system     Problem List Patient Active Problem List   Diagnosis Date Noted  . Intermediate stage nonexudative age-related macular degeneration of right eye 05/08/2020  . Exudative age-related macular degeneration of left eye with active choroidal neovascularization ([place]) 10/11/2019  . Intermediate stage nonexudative age-related macular degeneration of both eyes 10/11/2019  . Operculated retinal tear of left eye 10/11/2019  . Right posterior capsular opacification 10/11/2019  . Posterior vitreous detachment of left eye 10/11/2019  . Paving stone retinal degeneration of right eye 10/11/2019  . Calculus of kidney 03/12/2018  . Bleeding internal hemorrhoids 10/09/2017   . Sleep apnea 09/22/2017  . History of adenomatous polyp of colon 09/18/2017  . Prostate cancer ([place]) 09/18/2017  . Essential hypertension 08/26/2017  . Hyperlipidemia 08/26/2017  . Cerebral venous thrombosis 07/13/2017  . Intracerebral hemorrhage 07/10/2017  . ICH (intracerebral hemorrhage) ([place]) 07/10/2017  . Unstable angina [facility]) 03/12/2013    Silvestre Mesi 07/04/2020, 9:02 AM  [facility] Physical Therapy [address] [place], [place], [address] Phone: [phone]   Fax:  [phone]  Name: DAYMIEN CUTCHIN MRN: [id number] Date of Birth: 03/05/1946

## 2020-07-05 ENCOUNTER — Encounter (INDEPENDENT_AMBULATORY_CARE_PROVIDER_SITE_OTHER): Payer: Self-pay | Admitting: Ophthalmology

## 2020-07-05 ENCOUNTER — Ambulatory Visit (INDEPENDENT_AMBULATORY_CARE_PROVIDER_SITE_OTHER): Payer: Medicare Other | Admitting: Ophthalmology

## 2020-07-05 DIAGNOSIS — H353132 Nonexudative age-related macular degeneration, bilateral, intermediate dry stage: Secondary | ICD-10-CM

## 2020-07-05 DIAGNOSIS — H353221 Exudative age-related macular degeneration, left eye, with active choroidal neovascularization: Secondary | ICD-10-CM | POA: Diagnosis not present

## 2020-07-05 MED ORDER — BEVACIZUMAB 2.5 MG/0.1ML IZ SOSY
2.5000 mg | PREFILLED_SYRINGE | INTRAVITREAL | Status: AC | PRN
  Administered 2020-07-05: 2.5 mg via INTRAVITREAL

## 2020-07-05 NOTE — Assessment & Plan Note (Signed)
OD stable, no active disease by OCT today

## 2020-07-05 NOTE — Progress Notes (Signed)
07/05/2020     CHIEF COMPLAINT Patient presents for Retina Follow Up (8 WK FU OS, POSS AVASTIN OS ////Pt reports stable vision OU, pt denies any new F/F, pain or pressure OU. )   HISTORY OF PRESENT ILLNESS: Justin Adams is a 75 y.o. male who presents to the clinic today for:   HPI    Retina Follow Up    Patient presents with  Wet AMD.  In left eye.  This started 8 weeks ago.  Duration of 8 weeks.  Since onset it is stable. Additional comments: 8 WK FU OS, POSS AVASTIN OS     Pt reports stable vision OU, pt denies any new F/F, pain or pressure OU.        Last edited by Nichola Sizer D on 07/05/2020  8:25 AM. (History)      Referring physician: [name], MD [address] [place],  [place] [address]  HISTORICAL INFORMATION:   Selected notes from the MEDICAL RECORD NUMBER    Lab Results  Component Value Date   HGBA1C 5.3 07/11/2017     CURRENT MEDICATIONS: No current outpatient medications on file. (Ophthalmic Drugs)   No current facility-administered medications for this visit. (Ophthalmic Drugs)   Current Outpatient Medications (Other)  Medication Sig  . acetaminophen (TYLENOL) 500 MG tablet Take 500-1,000 mg by mouth daily as needed for moderate pain or headache.  Marland Kitchen amLODipine (NORVASC) 2.5 MG tablet Take 1 tablet (2.5 mg total) by mouth daily as needed (if systolic bp is over 580).  Marland Kitchen apixaban (ELIQUIS) 5 MG TABS tablet Take 1 tablet (5 mg total) by mouth 2 (two) times daily.  Marland Kitchen atorvastatin (LIPITOR) 20 MG tablet Take 1 tablet (20 mg total) by mouth daily.  Marland Kitchen lisinopril (PRINIVIL,ZESTRIL) 20 MG tablet Take 1 tablet (20 mg total) by mouth daily.  . Melatonin 3 MG TABS Take 3 mg by mouth at bedtime as needed (sleep).  . polyethylene glycol (MIRALAX / GLYCOLAX) packet Take 17 g by mouth at bedtime.  . Wheat Dextrin (BENEFIBER) POWD Take 1 Dose by mouth daily.   No current facility-administered medications for this visit. (Other)       REVIEW OF SYSTEMS:    ALLERGIES No Known Allergies  PAST MEDICAL HISTORY Past Medical History:  Diagnosis Date  . Adenomatous polyp   . Bleeding internal hemorrhoids 10/2017  . Cerebral venous thrombosis   . Grade I diastolic dysfunction 99/83/3825   noted on ECHO   . Hemorrhoids   . History of kidney stones   . Hypercholesteremia   . Hypertension   . Intracranial hemorrhage (HCC)    Right temporal lobe  . LVH (left ventricular hypertrophy) 07/11/2017   Mild, noted on ECHO   . Muscular degeneration   . Nephrolithiasis   . Obesity   . OSA (obstructive sleep apnea)   . Polycythemia   . Prostate cancer ([place]) 2003  . Shingles   . Stroke ([place]) 06/2017   No residual  . Unsteady gait   . Ureteral calculi 02/12/2008   Left   Past Surgical History:  Procedure Laterality Date  . APPENDECTOMY     childhood  . CHOLECYSTECTOMY    . COLONOSCOPY    . HEMORRHOID BANDING  2015  . HOLMIUM LASER APPLICATION Left 10/10/9765   Procedure: HOLMIUM LASER APPLICATION;  Surgeon: Franchot Gallo, MD;  Location: [facility];  Service: Urology;  Laterality: Left;  . IR ANGIO EXTERNAL CAROTID SEL EXT CAROTID UNI R  MOD SED  07/10/2017  . IR ANGIO INTRA EXTRACRAN SEL INTERNAL CAROTID BILAT MOD SED  07/10/2017  . IR ANGIO VERTEBRAL SEL VERTEBRAL BILAT MOD SED  07/10/2017  . IR URETERAL STENT LEFT NEW ACCESS W/O SEP NEPHROSTOMY CATH  03/12/2018  . LEFT HEART CATHETERIZATION WITH CORONARY ANGIOGRAM N/A 03/15/2013   Procedure: LEFT HEART CATHETERIZATION WITH CORONARY ANGIOGRAM;  Surgeon: Minus Breeding, MD;  Location: [facility] C[facility] LAB;  Service: Cardiovascular;  Laterality: N/A;  . NEPHROLITHOTOMY Left 03/12/2018   Procedure: LEFT NEPHROLITHOTOMY PERCUTANEOUS, STENT PLACEMENT;  Surgeon: Franchot Gallo, MD;  Location: [facility];  Service: Urology;  Laterality: Left;  . PROSTATECTOMY    . STONE EXTRACTION WITH BASKET      FAMILY HISTORY Family History  Problem Relation Age of Onset  . Hypertension  Mother   . Coronary artery disease Mother 44  . Breast cancer Mother   . Coronary artery disease Father 15       Died age 5  . CAD Brother 79    SOCIAL HISTORY Social History   Tobacco Use  . Smoking status: Never Smoker  . Smokeless tobacco: Never Used  Vaping Use  . Vaping Use: Never used  Substance Use Topics  . Alcohol use: No  . Drug use: No         OPHTHALMIC EXAM: Base Eye Exam    Visual Acuity (ETDRS)      Right Left   Dist cc 20/25 -1 E card @ 3ft   Correction: Glasses       Tonometry (Tonopen, 8:29 AM)      Right Left   Pressure 14 14       Pupils      Pupils Dark Light Shape React APD   Right PERRL 4 3 Round Brisk None   Left PERRL 4 3 Round Brisk None       Neuro/Psych    Oriented x3: Yes   Mood/Affect: Normal       Dilation    Left eye: 1.0% Mydriacyl, 2.5% Phenylephrine @ 8:29 AM        Slit Lamp and Fundus Exam    External Exam      Right Left   External Normal Normal       Slit Lamp Exam      Right Left   Lids/Lashes Normal Normal   Conjunctiva/Sclera White and quiet White and quiet   Cornea Clear Clear   Anterior Chamber Deep and quiet Deep and quiet   Iris Round and reactive Round and reactive   Lens Posterior chamber intraocular lens Posterior chamber intraocular lens   Anterior Vitreous Normal Normal       Fundus Exam      Right Left   Posterior Vitreous  Posterior vitreous detachment, Central vitreous floaters   Disc  Normal   C/D Ratio  0.45   Macula  Macular thickening, Subretinal neovascular membrane nasal, Retinal pigment epithelial mottling, Subretinal fibrosis, Hard drusen, Disciform scar, centrally with subretinal hemorrhage peripheral scarring   Vessels  Normal   Periphery  Normal, attached, 25 d, 90 d          IMAGING AND PROCEDURES  Imaging and Procedures for 07/05/20  OCT, Retina - OU - Both Eyes       Right Eye Quality was good. Scan locations included subfoveal. Central Foveal Thickness: 291.  Progression has been stable. Findings include retinal drusen , normal foveal contour, no IRF, no SRF.   Left Eye Quality was good.  Central Foveal Thickness: 258. Progression has worsened. Findings include abnormal foveal contour, intraretinal fluid.   Notes Chronic edge of CNVM, subfoveal disciform scar activity nasally OS, will repeat injection today at 9-week interval and follow-up again in 7 to 8 weeks       Intravitreal Injection, Pharmacologic Agent - OS - Left Eye       Time Out 07/05/2020. 9:27 AM. Confirmed correct patient, procedure, site, and patient consented.   Anesthesia Topical anesthesia was used. Anesthetic medications included Akten 3.5%.   Procedure Preparation included Ofloxacin , Tobramycin 0.3%, 5% betadine to ocular surface. A 30 gauge needle was used.   Injection:  2.5 mg Bevacizumab (AVASTIN) 2.5mg /0.69mL SOSY   NDC: [id number], [id number]:[id number]   Route: Intravitreal, Site: Left Eye  Post-op Post injection exam found visual acuity of at least counting fingers. The patient tolerated the procedure well. There were no complications. The patient received written and verbal post procedure care education. Post injection medications were not given.                 ASSESSMENT/PLAN:  Intermediate stage nonexudative age-related macular degeneration of both eyes OD stable, no active disease by OCT today  Exudative age-related macular degeneration of left eye with active choroidal neovascularization (HCC) Subfoveal disciform scar and atrophy limits acuity, edges of the lesion however still have subretinal hemorrhage thus will need to continue to treat to prevent progression of scotoma enlargement      ICD-10-CM   1. Exudative age-related macular degeneration of left eye with active choroidal neovascularization (HCC)  H35.3221 OCT, Retina - OU - Both Eyes    Intravitreal Injection, Pharmacologic Agent - OS - Left Eye    bevacizumab (AVASTIN) SOSY 2.5 mg  2.  Intermediate stage nonexudative age-related macular degeneration of both eyes  H35.3132     1.  2.  3.  Ophthalmic Meds Ordered this visit:  Meds ordered this encounter  Medications  . bevacizumab (AVASTIN) SOSY 2.5 mg       Return in about 8 weeks (around 08/30/2020) for dilate, OS, AVASTIN OCT.  There are no Patient Instructions on file for this visit.   Explained the diagnoses, plan, and follow up with the patient and they expressed understanding.  Patient expressed understanding of the importance of proper follow up care.   Clent Demark Raiya Stainback M.D. Diseases & Surgery of the Retina and Vitreous [facility] [place] 07/05/20     Abbreviations: M myopia (nearsighted); A astigmatism; H hyperopia (farsighted); P presbyopia; Mrx spectacle prescription;  CTL contact lenses; OD right eye; OS left eye; OU both eyes  XT exotropia; ET esotropia; PEK punctate epithelial keratitis; PEE punctate epithelial erosions; DES dry eye syndrome; MGD meibomian gland dysfunction; ATs artificial tears; PFAT's preservative free artificial tears; [place] nuclear sclerotic cataract; PSC posterior subcapsular cataract; ERM epi-retinal membrane; PVD posterior vitreous detachment; RD retinal detachment; DM diabetes mellitus; DR diabetic retinopathy; NPDR non-proliferative diabetic retinopathy; PDR proliferative diabetic retinopathy; CSME clinically significant macular edema; DME diabetic macular edema; dbh dot blot hemorrhages; CWS cotton wool spot; POAG primary open angle glaucoma; C/D cup-to-disc ratio; HVF humphrey visual field; GVF goldmann visual field; OCT optical coherence tomography; IOP intraocular pressure; BRVO Branch retinal vein occlusion; CRVO central retinal vein occlusion; CRAO central retinal artery occlusion; BRAO branch retinal artery occlusion; RT retinal tear; SB scleral buckle; PPV pars plana vitrectomy; VH Vitreous hemorrhage; PRP panretinal laser photocoagulation; IVK intravitreal  kenalog; VMT vitreomacular traction; MH Macular hole;  NVD neovascularization of  the disc; NVE neovascularization elsewhere; AREDS age related eye disease study; ARMD age related macular degeneration; POAG primary open angle glaucoma; EBMD epithelial/anterior basement membrane dystrophy; ACIOL anterior chamber intraocular lens; IOL intraocular lens; PCIOL posterior chamber intraocular lens; Phaco/IOL phacoemulsification with intraocular lens placement; [place] photorefractive keratectomy; LASIK laser assisted in situ keratomileusis; HTN hypertension; DM diabetes mellitus; COPD chronic obstructive pulmonary disease

## 2020-07-05 NOTE — Assessment & Plan Note (Signed)
Subfoveal disciform scar and atrophy limits acuity, edges of the lesion however still have subretinal hemorrhage thus will need to continue to treat to prevent progression of scotoma enlargement

## 2020-07-06 ENCOUNTER — Encounter: Payer: Self-pay | Admitting: Physical Therapy

## 2020-07-06 ENCOUNTER — Other Ambulatory Visit: Payer: Self-pay

## 2020-07-06 ENCOUNTER — Ambulatory Visit (INDEPENDENT_AMBULATORY_CARE_PROVIDER_SITE_OTHER): Payer: Medicare Other | Admitting: Physical Therapy

## 2020-07-06 DIAGNOSIS — I69154 Hemiplegia and hemiparesis following nontraumatic intracerebral hemorrhage affecting left non-dominant side: Secondary | ICD-10-CM

## 2020-07-06 DIAGNOSIS — G8929 Other chronic pain: Secondary | ICD-10-CM | POA: Diagnosis not present

## 2020-07-06 DIAGNOSIS — R29818 Other symptoms and signs involving the nervous system: Secondary | ICD-10-CM | POA: Diagnosis not present

## 2020-07-06 DIAGNOSIS — M6281 Muscle weakness (generalized): Secondary | ICD-10-CM | POA: Diagnosis not present

## 2020-07-06 DIAGNOSIS — M545 Low back pain, unspecified: Secondary | ICD-10-CM | POA: Diagnosis not present

## 2020-07-06 DIAGNOSIS — R2681 Unsteadiness on feet: Secondary | ICD-10-CM | POA: Diagnosis not present

## 2020-07-06 NOTE — Therapy (Signed)
[facility] [place], [place], [address] Phone: [phone]   Fax:  [phone]  Physical Therapy Treatment  Patient Details  Name: Justin Adams MRN: [id number] Date of Birth: [birth year] Referring Provider (PT): [place] MD   Encounter Date: 07/06/2020   PT End of Session - 07/06/20 0849    Visit Number 17    Number of Visits 20    Date for PT Re-Evaluation 08/15/20    Authorization Type MCR, needs starting visit 12    Progress Note Due on Visit 25    PT Start Time 0800    PT Stop Time 0845    PT Time Calculation (min) 45 min    Activity Tolerance Patient tolerated treatment well    Behavior During Therapy [facility] for tasks assessed/performed           Past Medical History:  Diagnosis Date  . Adenomatous polyp   . Bleeding internal hemorrhoids 10/2017  . Cerebral venous thrombosis   . Grade I diastolic dysfunction 52/77/8242   noted on ECHO   . Hemorrhoids   . History of kidney stones   . Hypercholesteremia   . Hypertension   . Intracranial hemorrhage (HCC)    Right temporal lobe  . LVH (left ventricular hypertrophy) 07/11/2017   Mild, noted on ECHO   . Muscular degeneration   . Nephrolithiasis   . Obesity   . OSA (obstructive sleep apnea)   . Polycythemia   . Prostate cancer ([place]) 2003  . Shingles   . Stroke ([place]) 06/2017   No residual  . Unsteady gait   . Ureteral calculi 02/12/2008   Left    Past Surgical History:  Procedure Laterality Date  . APPENDECTOMY     childhood  . CHOLECYSTECTOMY    . COLONOSCOPY    . HEMORRHOID BANDING  2015  . HOLMIUM LASER APPLICATION Left 35/08/6142   Procedure: HOLMIUM LASER APPLICATION;  Surgeon: Franchot Gallo, MD;  Location: [facility];  Service: Urology;  Laterality: Left;  . IR ANGIO EXTERNAL CAROTID SEL EXT CAROTID UNI R MOD SED  07/10/2017  . IR ANGIO INTRA EXTRACRAN SEL INTERNAL CAROTID BILAT MOD SED  07/10/2017  . IR ANGIO VERTEBRAL SEL VERTEBRAL BILAT MOD  SED  07/10/2017  . IR URETERAL STENT LEFT NEW ACCESS W/O SEP NEPHROSTOMY CATH  03/12/2018  . LEFT HEART CATHETERIZATION WITH CORONARY ANGIOGRAM N/A 03/15/2013   Procedure: LEFT HEART CATHETERIZATION WITH CORONARY ANGIOGRAM;  Surgeon: Minus Breeding, MD;  Location: [facility] CATH LAB;  Service: Cardiovascular;  Laterality: N/A;  . NEPHROLITHOTOMY Left 03/12/2018   Procedure: LEFT NEPHROLITHOTOMY PERCUTANEOUS, STENT PLACEMENT;  Surgeon: Franchot Gallo, MD;  Location: [facility];  Service: Urology;  Laterality: Left;  . PROSTATECTOMY    . STONE EXTRACTION WITH BASKET      There were no vitals filed for this visit.   Subjective Assessment - 07/06/20 0821    Subjective relays he had follow up with PCP who wants him to change up one of his meds that may be causing headaches and wants him to try extra strength tylenol for his chronic LBP.    Limitations Lifting;House hold activities    Patient Stated Goals reduce pain, improve balance    Pain Onset More than a month ago            [facility] Adult PT Treatment/Exercise - 07/06/20 0001      Neuro Re-ed    Neuro Re-ed Details  tandem walk and sidestepping on foam beam X  5 roundtrips.  SLS with cone taps on foam beam      Lumbar Exercises: Stretches   Double Knee to Chest Stretch Limitations 5 reps 10 seconds with feet on pball    Lower Trunk Rotation 5 reps;10 seconds    Other Lumbar Stretch Exercise seated pball roll outs 10 sec X 10 for lumbar flexion       Lumbar Exercises: Aerobic   Recumbent Bike L3 X 8 min      Lumbar Exercises: Machines for Strengthening   Leg Press 100 lbs 3 sets of 12      Lumbar Exercises: Standing   Lifting Limitations deadlift with blue band X 10 reps    Other Standing Lumbar Exercises suitcase carry 10 lbs one lap ea side, anti rotation walkout green band  x10    Other Standing Lumbar Exercises Rows and extensions Green 2X10      Lumbar Exercises: Seated   Sit to Stand Limitations 2 sets of 10 reps without UE support,  24 inch      Lumbar Exercises: Supine   Bridge Limitations 2 sets of 10 5 sec holds feet on pball    Straight Leg Raise 15 reps                    PT Short Term Goals - 06/01/20 0904      PT SHORT TERM GOAL #1   Title Pt will report performing walking program, HEP and [employer] 2-3 days/week    Baseline relays he averages 2 times per week on 12/23    Time 4    Period Weeks    Status Achieved    Target Date 04/20/20      PT SHORT TERM GOAL #2   Title Pt will improve functional LE strength decreasing five time sit to stand to </= 15 seconds    Baseline 14.99 on 12/23    Time 4    Period Weeks    Status Achieved             PT Long Term Goals - 06/29/20 7510      PT LONG TERM GOAL #1   Title Pt will improve 6 minute walk test by 160 ft(one extra lap) to show improved endurance and gait speed.    Baseline improved 168 ft so 1320 ft total on 12/23    Time 12    Period Weeks    Status Achieved      PT LONG TERM GOAL #2   Title Pt will increaese overall leg strength to at least 4+ to 5- overall to improve function    Baseline now 4+    Time 12    Period Weeks    Status On-going      PT LONG TERM GOAL #3   Title Pt will improve 5 times sit to stand test to less than 13 seconds    Baseline 14.99 on 12/23    Time 12    Period Weeks    Status Achieved      PT LONG TERM GOAL #4   Title Pt will be able SLS goal to 30 sec to show improved balance    Baseline 20 sec on 1/20    Status New      PT LONG TERM GOAL #5   Title -                 Plan - 07/06/20 0850    Clinical Impression Statement He  had good tolerance to activity/strength/balance progression today without complaints. PT will continue to progress this as able to address his functional deficits.    Personal Factors and Comorbidities Comorbidity 3+    Comorbidities CVA, sleep apnea, chronic LBP, history of falls    Examination-Activity Limitations Carry;Bend;Bed  Mobility;Dressing;Lift;Stand;Stairs;Squat;Sleep;Locomotion Level    Examination-Participation Restrictions Cleaning;Community Activity;Driving;Laundry;Yard Work;Volunteer    Stability/Clinical Decision Making Evolving/Moderate complexity    Rehab Potential Good    PT Frequency 2x / week   1-2   PT Duration 12 weeks    PT Treatment/Interventions ADLs/Self Care Home Management;Cryotherapy;Electrical Stimulation;Moist Heat;Traction;Gait Scientist, forensic;Therapeutic activities;Therapeutic exercise;Balance training;Neuromuscular re-education;Manual techniques;Passive range of motion;Dry needling;Joint Manipulations;Spinal Manipulations;Taping    PT Next Visit Plan balance, leg strength, endurance, pball core    PT Home Exercise Plan tandem balance, SLS, squats, sit to stands    Consulted and Agree with Plan of Care Patient           Patient will benefit from skilled therapeutic intervention in order to improve the following deficits and impairments:  Decreased activity tolerance,Decreased balance,Decreased endurance,Decreased mobility,Decreased range of motion,Decreased strength,Difficulty walking,Increased muscle spasms,Impaired flexibility,Postural dysfunction,Improper body mechanics,Pain  Visit Diagnosis: Unsteadiness on feet  Muscle weakness (generalized)  Chronic bilateral low back pain without sciatica  Hemiplegia and hemiparesis following nontraumatic intracerebral hemorrhage affecting left non-dominant side (HCC)  Other symptoms and signs involving the nervous system     Problem List Patient Active Problem List   Diagnosis Date Noted  . Intermediate stage nonexudative age-related macular degeneration of right eye 05/08/2020  . Exudative age-related macular degeneration of left eye with active choroidal neovascularization ([place]) 10/11/2019  . Intermediate stage nonexudative age-related macular degeneration of both eyes 10/11/2019  . Operculated retinal tear of left eye  10/11/2019  . Right posterior capsular opacification 10/11/2019  . Posterior vitreous detachment of left eye 10/11/2019  . [place] retinal degeneration of right eye 10/11/2019  . Calculus of kidney 03/12/2018  . Bleeding internal hemorrhoids 10/09/2017  . Sleep apnea 09/22/2017  . History of adenomatous polyp of colon 09/18/2017  . Prostate cancer ([place]) 09/18/2017  . Essential hypertension 08/26/2017  . Hyperlipidemia 08/26/2017  . Cerebral venous thrombosis 07/13/2017  . Intracerebral hemorrhage 07/10/2017  . ICH (intracerebral hemorrhage) ([place]) 07/10/2017  . Unstable angina ([place]) 03/12/2013    Debbe Odea, PT,DPT 07/06/2020, 8:52 AM  [facility] [address] [place], [place], [address] Phone: [phone]   Fax:  [phone]  Name: Justin Adams MRN: [id number] Date of Birth: February 17, 1946

## 2020-07-10 DIAGNOSIS — E7800 Pure hypercholesterolemia, unspecified: Secondary | ICD-10-CM | POA: Diagnosis not present

## 2020-07-10 DIAGNOSIS — I10 Essential (primary) hypertension: Secondary | ICD-10-CM | POA: Diagnosis not present

## 2020-07-11 ENCOUNTER — Other Ambulatory Visit: Payer: Self-pay

## 2020-07-11 ENCOUNTER — Ambulatory Visit (INDEPENDENT_AMBULATORY_CARE_PROVIDER_SITE_OTHER): Payer: Medicare Other | Admitting: Physical Therapy

## 2020-07-11 DIAGNOSIS — M545 Low back pain, unspecified: Secondary | ICD-10-CM | POA: Diagnosis not present

## 2020-07-11 DIAGNOSIS — M6281 Muscle weakness (generalized): Secondary | ICD-10-CM | POA: Diagnosis not present

## 2020-07-11 DIAGNOSIS — R29818 Other symptoms and signs involving the nervous system: Secondary | ICD-10-CM | POA: Diagnosis not present

## 2020-07-11 DIAGNOSIS — R2681 Unsteadiness on feet: Secondary | ICD-10-CM | POA: Diagnosis not present

## 2020-07-11 DIAGNOSIS — G8929 Other chronic pain: Secondary | ICD-10-CM

## 2020-07-11 DIAGNOSIS — I69154 Hemiplegia and hemiparesis following nontraumatic intracerebral hemorrhage affecting left non-dominant side: Secondary | ICD-10-CM

## 2020-07-11 NOTE — Therapy (Signed)
[place], [place], [address] Phone: [phone]   Fax:  [phone]  Physical Therapy Treatment  Patient Details  Name: Justin Adams MRN: [id number] Date of Birth: [birth year] Referring Provider (PT): Stoneking MD   Encounter Date: 07/11/2020   PT End of Session - 07/11/20 0934    Visit Number 18    Number of Visits 20    Date for PT Re-Evaluation 08/15/20    Authorization Type MCR    Progress Note Due on Visit 25    PT Start Time 0847    PT Stop Time 0930    PT Time Calculation (min) 43 min    Activity Tolerance Patient tolerated treatment well    Behavior During Therapy [facility] for tasks assessed/performed           Past Medical History:  Diagnosis Date  . Adenomatous polyp   . Bleeding internal hemorrhoids 10/2017  . Cerebral venous thrombosis   . Grade I diastolic dysfunction 63/06/6008   noted on ECHO   . Hemorrhoids   . History of kidney stones   . Hypercholesteremia   . Hypertension   . Intracranial hemorrhage (HCC)    Right temporal lobe  . LVH (left ventricular hypertrophy) 07/11/2017   Mild, noted on ECHO   . Muscular degeneration   . Nephrolithiasis   . Obesity   . OSA (obstructive sleep apnea)   . Polycythemia   . Prostate cancer ([place]) 2003  . Shingles   . Stroke ([place]) 06/2017   No residual  . Unsteady gait   . Ureteral calculi 02/12/2008   Left    Past Surgical History:  Procedure Laterality Date  . APPENDECTOMY     childhood  . CHOLECYSTECTOMY    . COLONOSCOPY    . HEMORRHOID BANDING  2015  . HOLMIUM LASER APPLICATION Left 93/07/3555   Procedure: HOLMIUM LASER APPLICATION;  Surgeon: Franchot Gallo, MD;  Location: [facility];  Service: Urology;  Laterality: Left;  . IR ANGIO EXTERNAL CAROTID SEL EXT CAROTID UNI R MOD SED  07/10/2017  . IR ANGIO INTRA EXTRACRAN SEL INTERNAL CAROTID BILAT MOD SED  07/10/2017  . IR ANGIO VERTEBRAL SEL VERTEBRAL BILAT MOD SED  07/10/2017  . IR  URETERAL STENT LEFT NEW ACCESS W/O SEP NEPHROSTOMY CATH  03/12/2018  . LEFT HEART CATHETERIZATION WITH CORONARY ANGIOGRAM N/A 03/15/2013   Procedure: LEFT HEART CATHETERIZATION WITH CORONARY ANGIOGRAM;  Surgeon: Minus Breeding, MD;  Location: [facility] CATH LAB;  Service: Cardiovascular;  Laterality: N/A;  . NEPHROLITHOTOMY Left 03/12/2018   Procedure: LEFT NEPHROLITHOTOMY PERCUTANEOUS, STENT PLACEMENT;  Surgeon: Franchot Gallo, MD;  Location: [facility];  Service: Urology;  Laterality: Left;  . PROSTATECTOMY    . STONE EXTRACTION WITH BASKET      There were no vitals filed for this visit.   Subjective Assessment - 07/11/20 0856    Subjective relays he had a busy morning but overall no LBP today upon arrival.    Limitations Lifting;House hold activities    Patient Stated Goals reduce pain, improve balance    Pain Onset More than a month ago              [facility] PT Assessment - 07/11/20 0001      Timed Up and Go Test   Normal TUG (seconds) 9.3             [facility] Adult PT Treatment/Exercise - 07/11/20 0001      Transfers   Five time sit  to stand comments  13.7   with UE support     Neuro Re-ed    Neuro Re-ed Details  tandem walk and sidestepping on foam beam X 5 roundtrips.  SLS      Lumbar Exercises: Stretches   Double Knee to Chest Stretch Limitations 5 reps 10 seconds with feet on pball    Lower Trunk Rotation 5 reps;10 seconds    Other Lumbar Stretch Exercise seated pball roll outs 10 sec X 10 for lumbar flexion       Lumbar Exercises: Aerobic   Tread Mill 2.5 mph for 8 min      Lumbar Exercises: Machines for Strengthening   Leg Press 106 lbs 3 sets of 10      Lumbar Exercises: Standing   Lifting Limitations deadlift with blue band X 15 reps    Other Standing Lumbar Exercises suitcase carry 10 lbs one lap ea side    Other Standing Lumbar Exercises Rows and extensions Green 2X15      Lumbar Exercises: Supine   Bridge Limitations 2 sets of 10 5 sec holds feet on pball     Straight Leg Raise 15 reps                    PT Short Term Goals - 06/01/20 0904      PT SHORT TERM GOAL #1   Title Pt will report performing walking program, HEP and [employer] 2-3 days/week    Baseline relays he averages 2 times per week on 12/23    Time 4    Period Weeks    Status Achieved    Target Date 04/20/20      PT SHORT TERM GOAL #2   Title Pt will improve functional LE strength decreasing five time sit to stand to </= 15 seconds    Baseline 14.99 on 12/23    Time 4    Period Weeks    Status Achieved             PT Long Term Goals - 06/29/20 9509      PT LONG TERM GOAL #1   Title Pt will improve 6 minute walk test by 160 ft(one extra lap) to show improved endurance and gait speed.    Baseline improved 168 ft so 1320 ft total on 12/23    Time 12    Period Weeks    Status Achieved      PT LONG TERM GOAL #2   Title Pt will increaese overall leg strength to at least 4+ to 5- overall to improve function    Baseline now 4+    Time 12    Period Weeks    Status On-going      PT LONG TERM GOAL #3   Title Pt will improve 5 times sit to stand test to less than 13 seconds    Baseline 14.99 on 12/23    Time 12    Period Weeks    Status Achieved      PT LONG TERM GOAL #4   Title Pt will be able SLS goal to 30 sec to show improved balance    Baseline 20 sec on 1/20    Status New      PT LONG TERM GOAL #5   Title -                 Plan - 07/11/20 0935    Clinical Impression Statement He showed functional improvment in TUG  test today which indicates improved gait speed and balance, however his 5 times sit to stand test decreased slightly from last time but is still much better than we he began PT. PT will continue to work to improve his overall function and fitness.    Personal Factors and Comorbidities Comorbidity 3+    Comorbidities CVA, sleep apnea, chronic LBP, history of falls    Examination-Activity Limitations Carry;Bend;Bed  Mobility;Dressing;Lift;Stand;Stairs;Squat;Sleep;Locomotion Level    Examination-Participation Restrictions Cleaning;Community Activity;Driving;Laundry;Yard Work;Volunteer    Stability/Clinical Decision Making Evolving/Moderate complexity    Rehab Potential Good    PT Frequency 2x / week   1-2   PT Duration 12 weeks    PT Treatment/Interventions ADLs/Self Care Home Management;Cryotherapy;Electrical Stimulation;Moist Heat;Traction;Gait Scientist, forensic;Therapeutic activities;Therapeutic exercise;Balance training;Neuromuscular re-education;Manual techniques;Passive range of motion;Dry needling;Joint Manipulations;Spinal Manipulations;Taping    PT Next Visit Plan balance, leg strength, endurance, pball core    PT Home Exercise Plan tandem balance, SLS, squats, sit to stands    Consulted and Agree with Plan of Care Patient           Patient will benefit from skilled therapeutic intervention in order to improve the following deficits and impairments:  Decreased activity tolerance,Decreased balance,Decreased endurance,Decreased mobility,Decreased range of motion,Decreased strength,Difficulty walking,Increased muscle spasms,Impaired flexibility,Postural dysfunction,Improper body mechanics,Pain  Visit Diagnosis: Unsteadiness on feet  Muscle weakness (generalized)  Chronic bilateral low back pain without sciatica  Hemiplegia and hemiparesis following nontraumatic intracerebral hemorrhage affecting left non-dominant side (HCC)  Other symptoms and signs involving the nervous system     Problem List Patient Active Problem List   Diagnosis Date Noted  . Intermediate stage nonexudative age-related macular degeneration of right eye 05/08/2020  . Exudative age-related macular degeneration of left eye with active choroidal neovascularization ([place]) 10/11/2019  . Intermediate stage nonexudative age-related macular degeneration of both eyes 10/11/2019  . Operculated retinal tear of left eye  10/11/2019  . Right posterior capsular opacification 10/11/2019  . Posterior vitreous detachment of left eye 10/11/2019  . Paving stone retinal degeneration of right eye 10/11/2019  . Calculus of kidney 03/12/2018  . Bleeding internal hemorrhoids 10/09/2017  . Sleep apnea 09/22/2017  . History of adenomatous polyp of colon 09/18/2017  . Prostate cancer ([place]) 09/18/2017  . Essential hypertension 08/26/2017  . Hyperlipidemia 08/26/2017  . Cerebral venous thrombosis 07/13/2017  . Intracerebral hemorrhage 07/10/2017  . ICH (intracerebral hemorrhage) ([place]) 07/10/2017  . Unstable angina ([place]) 03/12/2013    Debbe Odea, PT,DPT 07/11/2020, 9:38 AM  [facility] Physical Therapy [address] [place], [place], [address] Phone: [phone]   Fax:  [phone]  Name: DELAND CLEMMONS MRN: [id number] Date of Birth: Sep 27, 1945

## 2020-07-13 ENCOUNTER — Ambulatory Visit (INDEPENDENT_AMBULATORY_CARE_PROVIDER_SITE_OTHER): Payer: Medicare Other | Admitting: Physical Therapy

## 2020-07-13 ENCOUNTER — Other Ambulatory Visit: Payer: Self-pay

## 2020-07-13 ENCOUNTER — Encounter: Payer: Self-pay | Admitting: Physical Therapy

## 2020-07-13 DIAGNOSIS — M6281 Muscle weakness (generalized): Secondary | ICD-10-CM | POA: Diagnosis not present

## 2020-07-13 DIAGNOSIS — R29818 Other symptoms and signs involving the nervous system: Secondary | ICD-10-CM | POA: Diagnosis not present

## 2020-07-13 DIAGNOSIS — R2681 Unsteadiness on feet: Secondary | ICD-10-CM

## 2020-07-13 DIAGNOSIS — I69154 Hemiplegia and hemiparesis following nontraumatic intracerebral hemorrhage affecting left non-dominant side: Secondary | ICD-10-CM

## 2020-07-13 DIAGNOSIS — M545 Low back pain, unspecified: Secondary | ICD-10-CM | POA: Diagnosis not present

## 2020-07-13 DIAGNOSIS — G8929 Other chronic pain: Secondary | ICD-10-CM | POA: Diagnosis not present

## 2020-07-13 NOTE — Therapy (Signed)
[place], [place], [address] Phone: [phone]   Fax:  [phone]  Physical Therapy Treatment  Patient Details  Name: Justin Adams MRN: [id number] Date of Birth: 03-02-1946 Referring Provider (PT): Stoneking MD   Encounter Date: 07/13/2020   PT End of Session - 07/13/20 1032    Visit Number 19    Number of Visits 20    Date for PT Re-Evaluation 08/15/20    Authorization Type MCR    Progress Note Due on Visit 25    PT Start Time [id number]    PT Stop Time 1058    PT Time Calculation (min) 43 min    Activity Tolerance Patient tolerated treatment well    Behavior During Therapy [facility] for tasks assessed/performed           Past Medical History:  Diagnosis Date  . Adenomatous polyp   . Bleeding internal hemorrhoids 10/2017  . Cerebral venous thrombosis   . Grade I diastolic dysfunction 0000000   noted on ECHO   . Hemorrhoids   . History of kidney stones   . Hypercholesteremia   . Hypertension   . Intracranial hemorrhage (HCC)    Right temporal lobe  . LVH (left ventricular hypertrophy) 07/11/2017   Mild, noted on ECHO   . Muscular degeneration   . Nephrolithiasis   . Obesity   . OSA (obstructive sleep apnea)   . Polycythemia   . Prostate cancer ([place]) 2003  . Shingles   . Stroke ([place]) 06/2017   No residual  . Unsteady gait   . Ureteral calculi 02/12/2008   Left    Past Surgical History:  Procedure Laterality Date  . APPENDECTOMY     childhood  . CHOLECYSTECTOMY    . COLONOSCOPY    . HEMORRHOID BANDING  2015  . HOLMIUM LASER APPLICATION Left [id number]   Procedure: HOLMIUM LASER APPLICATION;  Surgeon: Franchot Gallo, MD;  Location: [facility];  Service: Urology;  Laterality: Left;  . IR ANGIO EXTERNAL CAROTID SEL EXT CAROTID UNI R MOD SED  07/10/2017  . IR ANGIO INTRA EXTRACRAN SEL INTERNAL CAROTID BILAT MOD SED  07/10/2017  . IR ANGIO VERTEBRAL SEL VERTEBRAL BILAT MOD SED  07/10/2017  . IR  URETERAL STENT LEFT NEW ACCESS W/O SEP NEPHROSTOMY CATH  03/12/2018  . LEFT HEART CATHETERIZATION WITH CORONARY ANGIOGRAM N/A 03/15/2013   Procedure: LEFT HEART CATHETERIZATION WITH CORONARY ANGIOGRAM;  Surgeon: Minus Breeding, MD;  Location: [facility] CATH LAB;  Service: Cardiovascular;  Laterality: N/A;  . NEPHROLITHOTOMY Left 03/12/2018   Procedure: LEFT NEPHROLITHOTOMY PERCUTANEOUS, STENT PLACEMENT;  Surgeon: Franchot Gallo, MD;  Location: [facility];  Service: Urology;  Laterality: Left;  . PROSTATECTOMY    . STONE EXTRACTION WITH BASKET      There were no vitals filed for this visit.   Subjective Assessment - 07/13/20 1031    Subjective relays he doing overall ok with his back pain today    Limitations Lifting;House hold activities    Patient Stated Goals reduce pain, improve balance    Pain Onset More than a month ago            [facility] Adult PT Treatment/Exercise - 07/13/20 0001      Neuro Re-ed    Neuro Re-ed Details  tandem walk and sidestepping on foam beam X 5 roundtrips.  SLS      Lumbar Exercises: Stretches   Double Knee to Chest Stretch Limitations 5 reps 10 seconds with feet on pball  Lower Trunk Rotation 5 reps;10 seconds    Other Lumbar Stretch Exercise seated pball roll outs 10 sec X 10 for lumbar flexion       Lumbar Exercises: Aerobic   Recumbent Bike L3 X 8 min      Lumbar Exercises: Machines for Strengthening   Leg Press --      Lumbar Exercises: Standing   Lifting Limitations deadlift with blue band X 15 reps    Other Standing Lumbar Exercises suitcase carry 10 lbs one lap ea side    Other Standing Lumbar Exercises TRX rows and squats 2 sets of 10 ea, TRX lunges X 10 bilat      Lumbar Exercises: Supine   Bridge Limitations 2 sets of 10 5 sec holds feet on pball    Straight Leg Raises Limitations 2 sets of 10                    PT Short Term Goals - 06/01/20 0904      PT SHORT TERM GOAL #1   Title Pt will report performing walking program, HEP  and [employer] 2-3 days/week    Baseline relays he averages 2 times per week on 12/23    Time 4    Period Weeks    Status Achieved    Target Date 04/20/20      PT SHORT TERM GOAL #2   Title Pt will improve functional LE strength decreasing five time sit to stand to </= 15 seconds    Baseline 14.99 on 12/23    Time 4    Period Weeks    Status Achieved             PT Long Term Goals - 06/29/20 5277      PT LONG TERM GOAL #1   Title Pt will improve 6 minute walk test by 160 ft(one extra lap) to show improved endurance and gait speed.    Baseline improved 168 ft so 1320 ft total on 12/23    Time 12    Period Weeks    Status Achieved      PT LONG TERM GOAL #2   Title Pt will increaese overall leg strength to at least 4+ to 5- overall to improve function    Baseline now 4+    Time 12    Period Weeks    Status On-going      PT LONG TERM GOAL #3   Title Pt will improve 5 times sit to stand test to less than 13 seconds    Baseline 14.99 on 12/23    Time 12    Period Weeks    Status Achieved      PT LONG TERM GOAL #4   Title Pt will be able SLS goal to 30 sec to show improved balance    Baseline 20 sec on 1/20    Status New      PT LONG TERM GOAL #5   Title -                 Plan - 07/13/20 1154    Clinical Impression Statement Continued to work to improve to leg strength, posture, and overall balance today. He does still get fatigued by the end of session but this is improving some overall.    Personal Factors and Comorbidities Comorbidity 3+    Comorbidities CVA, sleep apnea, chronic LBP, history of falls    Examination-Activity Limitations Carry;Bend;Bed Mobility;Dressing;Lift;Stand;Stairs;Squat;Sleep;Locomotion Level    Examination-Participation  Restrictions Cleaning;Community Activity;Driving;Laundry;Yard Work;Volunteer    Stability/Clinical Decision Making Evolving/Moderate complexity    Rehab Potential Good    PT Frequency 2x / week   1-2   PT  Duration 12 weeks    PT Treatment/Interventions ADLs/Self Care Home Management;Cryotherapy;Electrical Stimulation;Moist Heat;Traction;Gait Scientist, forensic;Therapeutic activities;Therapeutic exercise;Balance training;Neuromuscular re-education;Manual techniques;Passive range of motion;Dry needling;Joint Manipulations;Spinal Manipulations;Taping    PT Next Visit Plan balance, leg strength, endurance, pball core    PT Home Exercise Plan tandem balance, SLS, squats, sit to stands    Consulted and Agree with Plan of Care Patient           Patient will benefit from skilled therapeutic intervention in order to improve the following deficits and impairments:  Decreased activity tolerance,Decreased balance,Decreased endurance,Decreased mobility,Decreased range of motion,Decreased strength,Difficulty walking,Increased muscle spasms,Impaired flexibility,Postural dysfunction,Improper body mechanics,Pain  Visit Diagnosis: Unsteadiness on feet  Muscle weakness (generalized)  Chronic bilateral low back pain without sciatica  Hemiplegia and hemiparesis following nontraumatic intracerebral hemorrhage affecting left non-dominant side (HCC)  Other symptoms and signs involving the nervous system     Problem List Patient Active Problem List   Diagnosis Date Noted  . Intermediate stage nonexudative age-related macular degeneration of right eye 05/08/2020  . Exudative age-related macular degeneration of left eye with active choroidal neovascularization ([place]) 10/11/2019  . Intermediate stage nonexudative age-related macular degeneration of both eyes 10/11/2019  . Operculated retinal tear of left eye 10/11/2019  . Right posterior capsular opacification 10/11/2019  . Posterior vitreous detachment of left eye 10/11/2019  . Paving stone retinal degeneration of right eye 10/11/2019  . Calculus of kidney 03/12/2018  . Bleeding internal hemorrhoids 10/09/2017  . Sleep apnea 09/22/2017  . History of  adenomatous polyp of colon 09/18/2017  . Prostate cancer ([place]) 09/18/2017  . Essential hypertension 08/26/2017  . Hyperlipidemia 08/26/2017  . Cerebral venous thrombosis 07/13/2017  . Intracerebral hemorrhage 07/10/2017  . ICH (intracerebral hemorrhage) ([place]) 07/10/2017  . Unstable angina ([place]) 03/12/2013    Debbe Odea, PT,DPT 07/13/2020, 12:03 PM  [facility] Physical Therapy [address] [place], [place], [address] Phone: [phone]   Fax:  [phone]  Name: DYLLON HENKEN MRN: [id number] Date of Birth: January 06, 1946

## 2020-07-18 ENCOUNTER — Encounter: Payer: Self-pay | Admitting: Physical Therapy

## 2020-07-18 ENCOUNTER — Ambulatory Visit (INDEPENDENT_AMBULATORY_CARE_PROVIDER_SITE_OTHER): Payer: Medicare Other | Admitting: Physical Therapy

## 2020-07-18 ENCOUNTER — Other Ambulatory Visit: Payer: Self-pay

## 2020-07-18 DIAGNOSIS — R2681 Unsteadiness on feet: Secondary | ICD-10-CM | POA: Diagnosis not present

## 2020-07-18 DIAGNOSIS — I69154 Hemiplegia and hemiparesis following nontraumatic intracerebral hemorrhage affecting left non-dominant side: Secondary | ICD-10-CM | POA: Diagnosis not present

## 2020-07-18 DIAGNOSIS — G8929 Other chronic pain: Secondary | ICD-10-CM

## 2020-07-18 DIAGNOSIS — M545 Low back pain, unspecified: Secondary | ICD-10-CM | POA: Diagnosis not present

## 2020-07-18 DIAGNOSIS — I10 Essential (primary) hypertension: Secondary | ICD-10-CM | POA: Diagnosis not present

## 2020-07-18 DIAGNOSIS — R29818 Other symptoms and signs involving the nervous system: Secondary | ICD-10-CM | POA: Diagnosis not present

## 2020-07-18 DIAGNOSIS — M6281 Muscle weakness (generalized): Secondary | ICD-10-CM | POA: Diagnosis not present

## 2020-07-18 NOTE — Therapy (Signed)
[facility] Physical Therapy [address] [place], [place], [address] Phone: [phone]   Fax:  [phone]  Physical Therapy Treatment/Recert/progress note Progress Note reporting period date 06/30/19 to 07/18/20  See below for objective and subjective measurements relating to patients progress with PT.  Patient Details  Name: Justin Adams MRN: [id number] Date of Birth: 1945/08/07 Referring Provider (PT): Stoneking MD   Encounter Date: 07/18/2020   PT End of Session - 07/18/20 0817    Visit Number 20    Number of Visits 30    Date for PT Re-Evaluation 09/07/20    Authorization Type MCR    Progress Note Due on Visit 30    PT Start Time 0800    PT Stop Time 0844    PT Time Calculation (min) 44 min    Activity Tolerance Patient tolerated treatment well    Behavior During Therapy [facility] for tasks assessed/performed           Past Medical History:  Diagnosis Date  . Adenomatous polyp   . Bleeding internal hemorrhoids 10/2017  . Cerebral venous thrombosis   . Grade I diastolic dysfunction 08/67/6195   noted on ECHO   . Hemorrhoids   . History of kidney stones   . Hypercholesteremia   . Hypertension   . Intracranial hemorrhage (HCC)    Right temporal lobe  . LVH (left ventricular hypertrophy) 07/11/2017   Mild, noted on ECHO   . Muscular degeneration   . Nephrolithiasis   . Obesity   . OSA (obstructive sleep apnea)   . Polycythemia   . Prostate cancer ([place]) 2003  . Shingles   . Stroke ([place]) 06/2017   No residual  . Unsteady gait   . Ureteral calculi 02/12/2008   Left    Past Surgical History:  Procedure Laterality Date  . APPENDECTOMY     childhood  . CHOLECYSTECTOMY    . COLONOSCOPY    . HEMORRHOID BANDING  2015  . HOLMIUM LASER APPLICATION Left 02/10/2670   Procedure: HOLMIUM LASER APPLICATION;  Surgeon: Franchot Gallo, MD;  Location: [facility];  Service: Urology;  Laterality: Left;  . IR ANGIO EXTERNAL CAROTID SEL EXT CAROTID UNI  R MOD SED  07/10/2017  . IR ANGIO INTRA EXTRACRAN SEL INTERNAL CAROTID BILAT MOD SED  07/10/2017  . IR ANGIO VERTEBRAL SEL VERTEBRAL BILAT MOD SED  07/10/2017  . IR URETERAL STENT LEFT NEW ACCESS W/O SEP NEPHROSTOMY CATH  03/12/2018  . LEFT HEART CATHETERIZATION WITH CORONARY ANGIOGRAM N/A 03/15/2013   Procedure: LEFT HEART CATHETERIZATION WITH CORONARY ANGIOGRAM;  Surgeon: Minus Breeding, MD;  Location[facility] LAB;  Service: Cardiovascular;  Laterality: N/A;  . NEPHROLITHOTOMY Left 03/12/2018   Procedure: LEFT NEPHROLITHOTOMY PERCUTANEOUS, STENT PLACEMENT;  Surgeon: Franchot Gallo, MD;  Location: [facility];  Service: Urology;  Laterality: Left;  . PROSTATECTOMY    . STONE EXTRACTION WITH BASKET      There were no vitals filed for this visit.   Subjective Assessment - 07/18/20 0812    Subjective he relays no pain upon arrival, but continues to have pain and fatigue if he stands more than 10 minutes.  he feels he needs about 4-6 more weeks of PT.    Limitations Lifting;House hold activities    Patient Stated Goals reduce pain, improve balance    Pain Onset More than a month ago              [facility] PT Assessment - 07/18/20 0001  Assessment   Medical Diagnosis LBP, balance, general weakness    Referring Provider (PT) Stoneking MD      Single Leg Stance   Comments 9 sec avg on Lt, 15 sec avg on Rt      AROM   Lumbar Flexion 75%    Lumbar Extension 25%    Lumbar - Right Side Bend 75%    Lumbar - Left Side Bend 75%    Lumbar - Right Rotation 75%    Lumbar - Left Rotation 75%      Strength   Right Hip Flexion 4+/5    Right Hip ABduction 4+/5    Left Hip Flexion 4+/5    Left Hip ABduction 4+/5    Right Knee Flexion 5/5    Right Knee Extension 5/5    Left Knee Flexion 5/5    Left Knee Extension 5/5      Transfers   Five time sit to stand comments  13.3                         [facility] Adult PT Treatment/Exercise - 07/18/20 0001      Lumbar Exercises:  Stretches   Double Knee to Chest Stretch Limitations 5 reps 10 seconds with feet on pball    Lower Trunk Rotation 5 reps;10 seconds    Other Lumbar Stretch Exercise seated pball roll outs 10 sec X 10 for lumbar flexion       Lumbar Exercises: Aerobic   Tread Mill 2.3 for 8 min      Lumbar Exercises: Standing   Lifting Limitations deadlift with blue band X 15 reps    Other Standing Lumbar Exercises suitcase carry 15 lbs one lap ea side    Other Standing Lumbar Exercises Rows and extensions with blue band 2X15 ea                    PT Short Term Goals - 06/01/20 0904      PT SHORT TERM GOAL #1   Title Pt will report performing walking program, HEP and [employer] 2-3 days/week    Baseline relays he averages 2 times per week on 12/23    Time 4    Period Weeks    Status Achieved    Target Date 04/20/20      PT SHORT TERM GOAL #2   Title Pt will improve functional LE strength decreasing five time sit to stand to </= 15 seconds    Baseline 14.99 on 12/23    Time 4    Period Weeks    Status Achieved             PT Long Term Goals - 07/18/20 8882      PT LONG TERM GOAL #1   Title Pt will improve 6 minute walk test by 160 ft(one extra lap) to show improved endurance and gait speed.    Baseline improved 168 ft so 1320 ft total on 12/23    Time 12    Period Weeks    Status Achieved      PT LONG TERM GOAL #2   Title Pt will increaese overall leg strength to at least 4+ to 5- overall to improve function    Baseline now 4+ to 5    Time 12    Period Weeks    Status Achieved      PT LONG TERM GOAL #3   Title Pt will improve 5 times  sit to stand test to less than 13 seconds    Baseline 13.3 on 2/8    Time 12    Period Weeks    Status On-going      PT LONG TERM GOAL #4   Title Pt will be able SLS goal to 30 sec to show improved balance    Baseline 20 sec at best but 9 sec avg on Lt and 15 sec avg on Rt on 2/8    Status On-going      PT LONG TERM GOAL #5    Title -                 Plan - 07/18/20 0844    Clinical Impression Statement Progress note and recert shows he has made overall progress with balance and leg strength but still has mild deficits in these areas as well as posture. He has met 3/5 long term PT goals and will continue to benefit from skilled PT for another 6 weeks.    Personal Factors and Comorbidities Comorbidity 3+    Comorbidities CVA, sleep apnea, chronic LBP, history of falls    Examination-Activity Limitations Carry;Bend;Bed Mobility;Dressing;Lift;Stand;Stairs;Squat;Sleep;Locomotion Level    Examination-Participation Restrictions Cleaning;Community Activity;Driving;Laundry;Yard Work;Volunteer    Stability/Clinical Decision Making Evolving/Moderate complexity    Rehab Potential Good    PT Frequency 2x / week   1-2   PT Duration 12 weeks    PT Treatment/Interventions ADLs/Self Care Home Management;Cryotherapy;Electrical Stimulation;Moist Heat;Traction;Gait Scientist, forensic;Therapeutic activities;Therapeutic exercise;Balance training;Neuromuscular re-education;Manual techniques;Passive range of motion;Dry needling;Joint Manipulations;Spinal Manipulations;Taping    PT Next Visit Plan balance, leg strength, endurance, pball core    PT Home Exercise Plan tandem balance, SLS, squats, sit to stands    Consulted and Agree with Plan of Care Patient           Patient will benefit from skilled therapeutic intervention in order to improve the following deficits and impairments:  Decreased activity tolerance,Decreased balance,Decreased endurance,Decreased mobility,Decreased range of motion,Decreased strength,Difficulty walking,Increased muscle spasms,Impaired flexibility,Postural dysfunction,Improper body mechanics,Pain  Visit Diagnosis: Unsteadiness on feet  Muscle weakness (generalized)  Chronic bilateral low back pain without sciatica  Hemiplegia and hemiparesis following nontraumatic intracerebral hemorrhage  affecting left non-dominant side (HCC)  Other symptoms and signs involving the nervous system     Problem List Patient Active Problem List   Diagnosis Date Noted  . Intermediate stage nonexudative age-related macular degeneration of right eye 05/08/2020  . Exudative age-related macular degeneration of left eye with active choroidal neovascularization ([place]) 10/11/2019  . Intermediate stage nonexudative age-related macular degeneration of both eyes 10/11/2019  . Operculated retinal tear of left eye 10/11/2019  . Right posterior capsular opacification 10/11/2019  . Posterior vitreous detachment of left eye 10/11/2019  . Paving stone retinal degeneration of right eye 10/11/2019  . Calculus of kidney 03/12/2018  . Bleeding internal hemorrhoids 10/09/2017  . Sleep apnea 09/22/2017  . History of adenomatous polyp of colon 09/18/2017  . Prostate cancer ([place]) 09/18/2017  . Essential hypertension 08/26/2017  . Hyperlipidemia 08/26/2017  . Cerebral venous thrombosis 07/13/2017  . Intracerebral hemorrhage 07/10/2017  . ICH (intracerebral hemorrhage) ([place]) 07/10/2017  . Unstable angina ([place]) 03/12/2013    Debbe Odea, PT,DPT 07/18/2020, 8:48 AM  [facility] Physical Therapy [address] [place], [place], [address] Phone: [phone]   Fax:  [phone]  Name: Justin Adams MRN: [id number] Date of Birth: June 20, 1945

## 2020-07-20 ENCOUNTER — Ambulatory Visit (INDEPENDENT_AMBULATORY_CARE_PROVIDER_SITE_OTHER): Payer: Medicare Other | Admitting: Physical Therapy

## 2020-07-20 ENCOUNTER — Other Ambulatory Visit: Payer: Self-pay

## 2020-07-20 DIAGNOSIS — G8929 Other chronic pain: Secondary | ICD-10-CM

## 2020-07-20 DIAGNOSIS — R2681 Unsteadiness on feet: Secondary | ICD-10-CM | POA: Diagnosis not present

## 2020-07-20 DIAGNOSIS — M6281 Muscle weakness (generalized): Secondary | ICD-10-CM | POA: Diagnosis not present

## 2020-07-20 DIAGNOSIS — R29818 Other symptoms and signs involving the nervous system: Secondary | ICD-10-CM

## 2020-07-20 DIAGNOSIS — I69154 Hemiplegia and hemiparesis following nontraumatic intracerebral hemorrhage affecting left non-dominant side: Secondary | ICD-10-CM

## 2020-07-20 DIAGNOSIS — M545 Low back pain, unspecified: Secondary | ICD-10-CM | POA: Diagnosis not present

## 2020-07-20 NOTE — Therapy (Signed)
[place], [place], [address] Phone: [phone]   Fax:  [phone]  Physical Therapy Treatment  Patient Details  Name: Justin Adams MRN: [id number] Date of Birth: Aug 23, 1945 Referring Provider (PT): Stoneking MD   Encounter Date: 07/20/2020   PT End of Session - 07/20/20 0926    Visit Number 21    Number of Visits 30    Date for PT Re-Evaluation 09/07/20    Authorization Type MCR    Progress Note Due on Visit 30    PT Start Time 0845    PT Stop Time 0930    PT Time Calculation (min) 45 min    Activity Tolerance Patient tolerated treatment well    Behavior During Therapy [facility] for tasks assessed/performed           Past Medical History:  Diagnosis Date  . Adenomatous polyp   . Bleeding internal hemorrhoids 10/2017  . Cerebral venous thrombosis   . Grade I diastolic dysfunction 03/50/0938   noted on ECHO   . Hemorrhoids   . History of kidney stones   . Hypercholesteremia   . Hypertension   . Intracranial hemorrhage (HCC)    Right temporal lobe  . LVH (left ventricular hypertrophy) 07/11/2017   Mild, noted on ECHO   . Muscular degeneration   . Nephrolithiasis   . Obesity   . OSA (obstructive sleep apnea)   . Polycythemia   . Prostate cancer ([place]) 2003  . Shingles   . Stroke ([place]) 06/2017   No residual  . Unsteady gait   . Ureteral calculi 02/12/2008   Left    Past Surgical History:  Procedure Laterality Date  . APPENDECTOMY     childhood  . CHOLECYSTECTOMY    . COLONOSCOPY    . HEMORRHOID BANDING  2015  . HOLMIUM LASER APPLICATION Left 18/07/9935   Procedure: HOLMIUM LASER APPLICATION;  Surgeon: Franchot Gallo, MD;  Location: [facility];  Service: Urology;  Laterality: Left;  . IR ANGIO EXTERNAL CAROTID SEL EXT CAROTID UNI R MOD SED  07/10/2017  . IR ANGIO INTRA EXTRACRAN SEL INTERNAL CAROTID BILAT MOD SED  07/10/2017  . IR ANGIO VERTEBRAL SEL VERTEBRAL BILAT MOD SED  07/10/2017  . IR  URETERAL STENT LEFT NEW ACCESS W/O SEP NEPHROSTOMY CATH  03/12/2018  . LEFT HEART CATHETERIZATION WITH CORONARY ANGIOGRAM N/A 03/15/2013   Procedure: LEFT HEART CATHETERIZATION WITH CORONARY ANGIOGRAM;  Surgeon: Minus Breeding, MD;  Location: [facility] CATH LAB;  Service: Cardiovascular;  Laterality: N/A;  . NEPHROLITHOTOMY Left 03/12/2018   Procedure: LEFT NEPHROLITHOTOMY PERCUTANEOUS, STENT PLACEMENT;  Surgeon: Franchot Gallo, MD;  Location: [facility];  Service: Urology;  Laterality: Left;  . PROSTATECTOMY    . STONE EXTRACTION WITH BASKET      There were no vitals filed for this visit.   Subjective Assessment - 07/20/20 0909    Subjective he relays he had follow up with MD about BP which has fine and stable now, it had been running too low. He says his back feels fine upon arrival and denies back pain unless he stands too long.    Limitations Lifting;House hold activities    Patient Stated Goals reduce pain, improve balance    Pain Onset More than a month ago                             [facility] Adult PT Treatment/Exercise - 07/20/20 0001  Neuro Re-ed    Neuro Re-ed Details  tandem walk, SLS, [place] walk      Lumbar Exercises: Stretches   Double Knee to Chest Stretch Limitations 5 reps 10 seconds with feet on pball    Lower Trunk Rotation 5 reps;10 seconds    Other Lumbar Stretch Exercise seated pball roll outs 10 sec X 10 for lumbar flexion       Lumbar Exercises: Aerobic   Recumbent Bike L3 X 8 min      Lumbar Exercises: Standing   Lifting Limitations deadlift with blue band X 20 reps    Shoulder Extension Both;15 reps    Theraband Level (Shoulder Extension) Level 4 (Blue)    Shoulder Extension Limitations 2 sets    Other Standing Lumbar Exercises suitcase carry 15 lbs one lap ea side    Other Standing Lumbar Exercises TRX rows and squats 2 sets of 10 ea      Lumbar Exercises: Supine   Bridge Limitations 2 sets of 10 5 sec holds feet on pball    Straight  Leg Raises Limitations 2 sets of 10                    PT Short Term Goals - 06/01/20 0904      PT SHORT TERM GOAL #1   Title Pt will report performing walking program, HEP and [employer] 2-3 days/week    Baseline relays he averages 2 times per week on 12/23    Time 4    Period Weeks    Status Achieved    Target Date 04/20/20      PT SHORT TERM GOAL #2   Title Pt will improve functional LE strength decreasing five time sit to stand to </= 15 seconds    Baseline 14.99 on 12/23    Time 4    Period Weeks    Status Achieved             PT Long Term Goals - 07/18/20 2423      PT LONG TERM GOAL #1   Title Pt will improve 6 minute walk test by 160 ft(one extra lap) to show improved endurance and gait speed.    Baseline improved 168 ft so 1320 ft total on 12/23    Time 12    Period Weeks    Status Achieved      PT LONG TERM GOAL #2   Title Pt will increaese overall leg strength to at least 4+ to 5- overall to improve function    Baseline now 4+ to 5    Time 12    Period Weeks    Status Achieved      PT LONG TERM GOAL #3   Title Pt will improve 5 times sit to stand test to less than 13 seconds    Baseline 13.3 on 2/8    Time 12    Period Weeks    Status On-going      PT LONG TERM GOAL #4   Title Pt will be able SLS goal to 30 sec to show improved balance    Baseline 20 sec at best but 9 sec avg on Lt and 15 sec avg on Rt on 2/8    Status On-going      PT LONG TERM GOAL #5   Title -                 Plan - 07/20/20 0927    Clinical Impression Statement  He did show some overall improvements in balance today. We will continue to progress his posture, strengthening, and standing actiivty tolerance as tolerated.    Personal Factors and Comorbidities Comorbidity 3+    Comorbidities CVA, sleep apnea, chronic LBP, history of falls    Examination-Activity Limitations Carry;Bend;Bed Mobility;Dressing;Lift;Stand;Stairs;Squat;Sleep;Locomotion Level     Examination-Participation Restrictions Cleaning;Community Activity;Driving;Laundry;Yard Work;Volunteer    Stability/Clinical Decision Making Evolving/Moderate complexity    Rehab Potential Good    PT Frequency 2x / week   1-2   PT Duration 12 weeks    PT Treatment/Interventions ADLs/Self Care Home Management;Cryotherapy;Electrical Stimulation;Moist Heat;Traction;Gait Scientist, forensic;Therapeutic activities;Therapeutic exercise;Balance training;Neuromuscular re-education;Manual techniques;Passive range of motion;Dry needling;Joint Manipulations;Spinal Manipulations;Taping    PT Next Visit Plan balance, leg strength, endurance, pball core    PT Home Exercise Plan tandem balance, SLS, squats, sit to stands    Consulted and Agree with Plan of Care Patient           Patient will benefit from skilled therapeutic intervention in order to improve the following deficits and impairments:  Decreased activity tolerance,Decreased balance,Decreased endurance,Decreased mobility,Decreased range of motion,Decreased strength,Difficulty walking,Increased muscle spasms,Impaired flexibility,Postural dysfunction,Improper body mechanics,Pain  Visit Diagnosis: Unsteadiness on feet  Muscle weakness (generalized)  Chronic bilateral low back pain without sciatica  Hemiplegia and hemiparesis following nontraumatic intracerebral hemorrhage affecting left non-dominant side (HCC)  Other symptoms and signs involving the nervous system     Problem List Patient Active Problem List   Diagnosis Date Noted  . Intermediate stage nonexudative age-related macular degeneration of right eye 05/08/2020  . Exudative age-related macular degeneration of left eye with active choroidal neovascularization ([place]) 10/11/2019  . Intermediate stage nonexudative age-related macular degeneration of both eyes 10/11/2019  . Operculated retinal tear of left eye 10/11/2019  . Right posterior capsular opacification 10/11/2019  .  Posterior vitreous detachment of left eye 10/11/2019  . Paving stone retinal degeneration of right eye 10/11/2019  . Calculus of kidney 03/12/2018  . Bleeding internal hemorrhoids 10/09/2017  . Sleep apnea 09/22/2017  . History of adenomatous polyp of colon 09/18/2017  . Prostate cancer ([place]) 09/18/2017  . Essential hypertension 08/26/2017  . Hyperlipidemia 08/26/2017  . Cerebral venous thrombosis 07/13/2017  . Intracerebral hemorrhage 07/10/2017  . ICH (intracerebral hemorrhage) ([place]) 07/10/2017  . Unstable angina ([place]) 03/12/2013    Debbe Odea , PT,DPT 07/20/2020, 9:31 AM  [facility] Physical Therapy [address] [place], [place], [address] Phone: [phone]   Fax:  [phone]  Name: Justin Adams MRN: [id number] Date of Birth: April 23, 1946

## 2020-07-25 ENCOUNTER — Other Ambulatory Visit: Payer: Self-pay

## 2020-07-25 ENCOUNTER — Encounter: Payer: Self-pay | Admitting: Physical Therapy

## 2020-07-25 ENCOUNTER — Ambulatory Visit (INDEPENDENT_AMBULATORY_CARE_PROVIDER_SITE_OTHER): Payer: Medicare Other | Admitting: Physical Therapy

## 2020-07-25 DIAGNOSIS — M6281 Muscle weakness (generalized): Secondary | ICD-10-CM

## 2020-07-25 DIAGNOSIS — R2681 Unsteadiness on feet: Secondary | ICD-10-CM

## 2020-07-25 DIAGNOSIS — M545 Low back pain, unspecified: Secondary | ICD-10-CM

## 2020-07-25 DIAGNOSIS — G8929 Other chronic pain: Secondary | ICD-10-CM | POA: Diagnosis not present

## 2020-07-25 DIAGNOSIS — R29818 Other symptoms and signs involving the nervous system: Secondary | ICD-10-CM | POA: Diagnosis not present

## 2020-07-25 DIAGNOSIS — I69154 Hemiplegia and hemiparesis following nontraumatic intracerebral hemorrhage affecting left non-dominant side: Secondary | ICD-10-CM | POA: Diagnosis not present

## 2020-07-25 NOTE — Therapy (Signed)
[place], [place], [address] Phone: [phone]   Fax:  [phone]  Physical Therapy Treatment  Patient Details  Name: Justin Adams MRN: [id number] Date of Birth: 10/20/1945 Referring Provider (PT): Stoneking MD   Encounter Date: 07/25/2020   PT End of Session - 07/25/20 0843    Visit Number 22    Number of Visits 30    Date for PT Re-Evaluation 09/07/20    Authorization Type MCR    Progress Note Due on Visit 30    PT Start Time 0800    PT Stop Time 0844    PT Time Calculation (min) 44 min    Activity Tolerance Patient tolerated treatment well    Behavior During Therapy [facility] for tasks assessed/performed           Past Medical History:  Diagnosis Date  . Adenomatous polyp   . Bleeding internal hemorrhoids 10/2017  . Cerebral venous thrombosis   . Grade I diastolic dysfunction 74/01/1447   noted on ECHO   . Hemorrhoids   . History of kidney stones   . Hypercholesteremia   . Hypertension   . Intracranial hemorrhage (HCC)    Right temporal lobe  . LVH (left ventricular hypertrophy) 07/11/2017   Mild, noted on ECHO   . Muscular degeneration   . Nephrolithiasis   . Obesity   . OSA (obstructive sleep apnea)   . Polycythemia   . Prostate cancer ([place]) 2003  . Shingles   . Stroke ([place]) 06/2017   No residual  . Unsteady gait   . Ureteral calculi 02/12/2008   Left    Past Surgical History:  Procedure Laterality Date  . APPENDECTOMY     childhood  . CHOLECYSTECTOMY    . COLONOSCOPY    . HEMORRHOID BANDING  2015  . HOLMIUM LASER APPLICATION Left 18/10/6312   Procedure: HOLMIUM LASER APPLICATION;  Surgeon: Franchot Gallo, MD;  Location: [facility];  Service: Urology;  Laterality: Left;  . IR ANGIO EXTERNAL CAROTID SEL EXT CAROTID UNI R MOD SED  07/10/2017  . IR ANGIO INTRA EXTRACRAN SEL INTERNAL CAROTID BILAT MOD SED  07/10/2017  . IR ANGIO VERTEBRAL SEL VERTEBRAL BILAT MOD SED  07/10/2017  . IR  URETERAL STENT LEFT NEW ACCESS W/O SEP NEPHROSTOMY CATH  03/12/2018  . LEFT HEART CATHETERIZATION WITH CORONARY ANGIOGRAM N/A 03/15/2013   Procedure: LEFT HEART CATHETERIZATION WITH CORONARY ANGIOGRAM;  Surgeon: Minus Breeding, MD;  [facility];  Service: Cardiovascular;  Laterality: N/A;  . NEPHROLITHOTOMY Left 03/12/2018   Procedure: LEFT NEPHROLITHOTOMY PERCUTANEOUS, STENT PLACEMENT;  Surgeon: Franchot Gallo, MD;  Location: [facility];  Service: Urology;  Laterality: Left;  . PROSTATECTOMY    . STONE EXTRACTION WITH BASKET      There were no vitals filed for this visit.   Subjective Assessment - 07/25/20 0819    Subjective he relays his back is feeling really good today and denies pain.    Limitations Lifting;House hold activities    Patient Stated Goals reduce pain, improve balance    Pain Onset More than a month ago                [facility] Adult PT Treatment/Exercise - 07/25/20 0001      Neuro Re-ed    Neuro Re-ed Details  tandem walking, balance on bosu with fingertip support      Lumbar Exercises: Stretches   Double Knee to Chest Stretch Limitations 5 reps 10 seconds with feet  on pball    Lower Trunk Rotation 5 reps;10 seconds    Other Lumbar Stretch Exercise seated pball roll outs 10 sec X 10 for lumbar flexion       Lumbar Exercises: Aerobic   Recumbent Bike L3 X 8 min      Lumbar Exercises: Standing   Other Standing Lumbar Exercises suitcase carry 15 lbs one lap ea side    Other Standing Lumbar Exercises TRX rows and squats 2 sets of 10 ea      Lumbar Exercises: Seated   Sit to Stand Limitations 2 sets of 5 with airex pad in chair UE pushup from knees      Lumbar Exercises: Supine   Bridge Limitations 2 sets of 10 5 sec holds feet on pball    Straight Leg Raises Limitations 2 sets of 10                    PT Short Term Goals - 06/01/20 0904      PT SHORT TERM GOAL #1   Title Pt will report performing walking program, HEP and [employer]  2-3 days/week    Baseline relays he averages 2 times per week on 12/23    Time 4    Period Weeks    Status Achieved    Target Date 04/20/20      PT SHORT TERM GOAL #2   Title Pt will improve functional LE strength decreasing five time sit to stand to </= 15 seconds    Baseline 14.99 on 12/23    Time 4    Period Weeks    Status Achieved             PT Long Term Goals - 07/18/20 2725      PT LONG TERM GOAL #1   Title Pt will improve 6 minute walk test by 160 ft(one extra lap) to show improved endurance and gait speed.    Baseline improved 168 ft so 1320 ft total on 12/23    Time 12    Period Weeks    Status Achieved      PT LONG TERM GOAL #2   Title Pt will increaese overall leg strength to at least 4+ to 5- overall to improve function    Baseline now 4+ to 5    Time 12    Period Weeks    Status Achieved      PT LONG TERM GOAL #3   Title Pt will improve 5 times sit to stand test to less than 13 seconds    Baseline 13.3 on 2/8    Time 12    Period Weeks    Status On-going      PT LONG TERM GOAL #4   Title Pt will be able SLS goal to 30 sec to show improved balance    Baseline 20 sec at best but 9 sec avg on Lt and 15 sec avg on Rt on 2/8    Status On-going      PT LONG TERM GOAL #5   Title -                 Plan - 07/25/20 0844    Clinical Impression Statement He did not have any pain with his functional strengthening program today. Overall is progressing as expected and will continue to benefit from PT.    Personal Factors and Comorbidities Comorbidity 3+    Comorbidities CVA, sleep apnea, chronic LBP, history of falls  Examination-Activity Limitations Carry;Bend;Bed Mobility;Dressing;Lift;Stand;Stairs;Squat;Sleep;Locomotion Level    Examination-Participation Restrictions Cleaning;Community Activity;Driving;Laundry;Yard Work;Volunteer    Stability/Clinical Decision Making Evolving/Moderate complexity    Rehab Potential Good    PT Frequency 2x / week    1-2   PT Duration 12 weeks    PT Treatment/Interventions ADLs/Self Care Home Management;Cryotherapy;Electrical Stimulation;Moist Heat;Traction;Gait Scientist, forensic;Therapeutic activities;Therapeutic exercise;Balance training;Neuromuscular re-education;Manual techniques;Passive range of motion;Dry needling;Joint Manipulations;Spinal Manipulations;Taping    PT Next Visit Plan balance, leg strength, endurance, pball core    PT Home Exercise Plan tandem balance, SLS, squats, sit to stands    Consulted and Agree with Plan of Care Patient           Patient will benefit from skilled therapeutic intervention in order to improve the following deficits and impairments:  Decreased activity tolerance,Decreased balance,Decreased endurance,Decreased mobility,Decreased range of motion,Decreased strength,Difficulty walking,Increased muscle spasms,Impaired flexibility,Postural dysfunction,Improper body mechanics,Pain  Visit Diagnosis: Unsteadiness on feet  Muscle weakness (generalized)  Chronic bilateral low back pain without sciatica  Hemiplegia and hemiparesis following nontraumatic intracerebral hemorrhage affecting left non-dominant side (HCC)  Other symptoms and signs involving the nervous system     Problem List Patient Active Problem List   Diagnosis Date Noted  . Intermediate stage nonexudative age-related macular degeneration of right eye 05/08/2020  . Exudative age-related macular degeneration of left eye with active choroidal neovascularization ([place]) 10/11/2019  . Intermediate stage nonexudative age-related macular degeneration of both eyes 10/11/2019  . Operculated retinal tear of left eye 10/11/2019  . Right posterior capsular opacification 10/11/2019  . Posterior vitreous detachment of left eye 10/11/2019  . Paving stone retinal degeneration of right eye 10/11/2019  . Calculus of kidney 03/12/2018  . Bleeding internal hemorrhoids 10/09/2017  . Sleep apnea 09/22/2017  .  History of adenomatous polyp of colon 09/18/2017  . Prostate cancer ([place]) 09/18/2017  . Essential hypertension 08/26/2017  . Hyperlipidemia 08/26/2017  . Cerebral venous thrombosis 07/13/2017  . Intracerebral hemorrhage 07/10/2017  . ICH (intracerebral hemorrhage) ([place]) 07/10/2017  . Unstable angina ([place]) 03/12/2013    Debbe Odea, PT,DPT 07/25/2020, 8:47 AM  [facility] [address] [place], [place], [address] Phone: [phone]   Fax:  [phone]  Name: Justin Adams MRN: [id number] Date of Birth: 1945/09/08

## 2020-07-26 DIAGNOSIS — D1801 Hemangioma of skin and subcutaneous tissue: Secondary | ICD-10-CM | POA: Diagnosis not present

## 2020-07-26 DIAGNOSIS — D2271 Melanocytic nevi of right lower limb, including hip: Secondary | ICD-10-CM | POA: Diagnosis not present

## 2020-07-26 DIAGNOSIS — D225 Melanocytic nevi of trunk: Secondary | ICD-10-CM | POA: Diagnosis not present

## 2020-07-26 DIAGNOSIS — L821 Other seborrheic keratosis: Secondary | ICD-10-CM | POA: Diagnosis not present

## 2020-07-26 DIAGNOSIS — D2272 Melanocytic nevi of left lower limb, including hip: Secondary | ICD-10-CM | POA: Diagnosis not present

## 2020-07-27 ENCOUNTER — Other Ambulatory Visit: Payer: Self-pay

## 2020-07-27 ENCOUNTER — Ambulatory Visit (INDEPENDENT_AMBULATORY_CARE_PROVIDER_SITE_OTHER): Payer: Medicare Other | Admitting: Physical Therapy

## 2020-07-27 DIAGNOSIS — G8929 Other chronic pain: Secondary | ICD-10-CM | POA: Diagnosis not present

## 2020-07-27 DIAGNOSIS — R29818 Other symptoms and signs involving the nervous system: Secondary | ICD-10-CM | POA: Diagnosis not present

## 2020-07-27 DIAGNOSIS — I69154 Hemiplegia and hemiparesis following nontraumatic intracerebral hemorrhage affecting left non-dominant side: Secondary | ICD-10-CM | POA: Diagnosis not present

## 2020-07-27 DIAGNOSIS — M6281 Muscle weakness (generalized): Secondary | ICD-10-CM | POA: Diagnosis not present

## 2020-07-27 DIAGNOSIS — R2681 Unsteadiness on feet: Secondary | ICD-10-CM

## 2020-07-27 DIAGNOSIS — M545 Low back pain, unspecified: Secondary | ICD-10-CM

## 2020-07-27 NOTE — Therapy (Signed)
[place], [place], [address] Phone: [phone]   Fax:  [phone]  Physical Therapy Treatment  Patient Details  Name: Justin Adams MRN: [id number] Date of Birth: 11/03/1945 Referring Provider (PT): Stoneking MD   Encounter Date: 07/27/2020   PT End of Session - 07/27/20 0936    Visit Number 23    Number of Visits 30    Date for PT Re-Evaluation 09/07/20    Authorization Type MCR    Progress Note Due on Visit 30    PT Start Time 0845    PT Stop Time 0928    PT Time Calculation (min) 43 min    Activity Tolerance Patient tolerated treatment well    Behavior During Therapy [facility] for tasks assessed/performed           Past Medical History:  Diagnosis Date  . Adenomatous polyp   . Bleeding internal hemorrhoids 10/2017  . Cerebral venous thrombosis   . Grade I diastolic dysfunction 75/03/2584   noted on ECHO   . Hemorrhoids   . History of kidney stones   . Hypercholesteremia   . Hypertension   . Intracranial hemorrhage (HCC)    Right temporal lobe  . LVH (left ventricular hypertrophy) 07/11/2017   Mild, noted on ECHO   . Muscular degeneration   . Nephrolithiasis   . Obesity   . OSA (obstructive sleep apnea)   . Polycythemia   . Prostate cancer ([place]) 2003  . Shingles   . Stroke ([place]) 06/2017   No residual  . Unsteady gait   . Ureteral calculi 02/12/2008   Left    Past Surgical History:  Procedure Laterality Date  . APPENDECTOMY     childhood  . CHOLECYSTECTOMY    . COLONOSCOPY    . HEMORRHOID BANDING  2015  . HOLMIUM LASER APPLICATION Left 27/12/8240   Procedure: HOLMIUM LASER APPLICATION;  Surgeon: Franchot Gallo, MD;  Location: [facility];  Service: Urology;  Laterality: Left;  . IR ANGIO EXTERNAL CAROTID SEL EXT CAROTID UNI R MOD SED  07/10/2017  . IR ANGIO INTRA EXTRACRAN SEL INTERNAL CAROTID BILAT MOD SED  07/10/2017  . IR ANGIO VERTEBRAL SEL VERTEBRAL BILAT MOD SED  07/10/2017  . IR  URETERAL STENT LEFT NEW ACCESS W/O SEP NEPHROSTOMY CATH  03/12/2018  . LEFT HEART CATHETERIZATION WITH CORONARY ANGIOGRAM N/A 03/15/2013   Procedure: LEFT HEART CATHETERIZATION WITH CORONARY ANGIOGRAM;  Surgeon: Minus Breeding, MD;  Location: [facility] CATH LAB;  Service: Cardiovascular;  Laterality: N/A;  . NEPHROLITHOTOMY Left 03/12/2018   Procedure: LEFT NEPHROLITHOTOMY PERCUTANEOUS, STENT PLACEMENT;  Surgeon: Franchot Gallo, MD;  Location: [facility];  Service: Urology;  Laterality: Left;  . PROSTATECTOMY    . STONE EXTRACTION WITH BASKET      There were no vitals filed for this visit.   Subjective Assessment - 07/27/20 0914    Subjective he relays his back is feeling better first thing in the morning and is not waking up with as much pain as he was, he does still have pain with prolonged standing or walking.    Limitations Lifting;House hold activities    Patient Stated Goals reduce pain, improve balance    Pain Onset More than a month ago            [facility] Adult PT Treatment/Exercise - 07/27/20 0001      Neuro Re-ed    Neuro Re-ed Details  tandem walking, [place] walking, walking with eyes closed, retrowalking eyes open  Lumbar Exercises: Stretches   Double Knee to Chest Stretch Limitations 5 reps 10 seconds with feet on pball    Lower Trunk Rotation 5 reps;10 seconds    Other Lumbar Stretch Exercise seated pball roll outs 10 sec X 10 for lumbar flexion       Lumbar Exercises: Aerobic   Recumbent Bike L3 X 8 min      Lumbar Exercises: Machines for Strengthening   Leg Press 106 lbs 3 sets of 10    Other Lumbar Machine Exercise row machine 35 lbs 3X10 reps    Other Lumbar Machine Exercise lat pull 35 lbs 2X15 reps      Lumbar Exercises: Standing   Other Standing Lumbar Exercises suitcase carry 15 lbs one lap ea side    Other Standing Lumbar Exercises Tband rows and extensions blue 2X15 ea      Lumbar Exercises: Seated   Sit to Stand Limitations 2 sets of 10 from raised mat  surface      Lumbar Exercises: Supine   Bridge Limitations 2 sets of 10 5 sec holds feet on pball    Straight Leg Raises Limitations 2 sets of 10                    PT Short Term Goals - 06/01/20 0904      PT SHORT TERM GOAL #1   Title Pt will report performing walking program, HEP and [employer] 2-3 days/week    Baseline relays he averages 2 times per week on 12/23    Time 4    Period Weeks    Status Achieved    Target Date 04/20/20      PT SHORT TERM GOAL #2   Title Pt will improve functional LE strength decreasing five time sit to stand to </= 15 seconds    Baseline 14.99 on 12/23    Time 4    Period Weeks    Status Achieved             PT Long Term Goals - 07/18/20 4034      PT LONG TERM GOAL #1   Title Pt will improve 6 minute walk test by 160 ft(one extra lap) to show improved endurance and gait speed.    Baseline improved 168 ft so 1320 ft total on 12/23    Time 12    Period Weeks    Status Achieved      PT LONG TERM GOAL #2   Title Pt will increaese overall leg strength to at least 4+ to 5- overall to improve function    Baseline now 4+ to 5    Time 12    Period Weeks    Status Achieved      PT LONG TERM GOAL #3   Title Pt will improve 5 times sit to stand test to less than 13 seconds    Baseline 13.3 on 2/8    Time 12    Period Weeks    Status On-going      PT LONG TERM GOAL #4   Title Pt will be able SLS goal to 30 sec to show improved balance    Baseline 20 sec at best but 9 sec avg on Lt and 15 sec avg on Rt on 2/8    Status On-going      PT LONG TERM GOAL #5   Title -  Plan - 07/27/20 0937    Clinical Impression Statement Continued to work on standing strengthening execises and posture and balance. He did show improvements in overall balance today with his dynamic balance challenges. He will continue to benefit from PT.    Personal Factors and Comorbidities Comorbidity 3+    Comorbidities CVA, sleep  apnea, chronic LBP, history of falls    Examination-Activity Limitations Carry;Bend;Bed Mobility;Dressing;Lift;Stand;Stairs;Squat;Sleep;Locomotion Level    Examination-Participation Restrictions Cleaning;Community Activity;Driving;Laundry;Yard Work;Volunteer    Stability/Clinical Decision Making Evolving/Moderate complexity    Rehab Potential Good    PT Frequency 2x / week   1-2   PT Duration 12 weeks    PT Treatment/Interventions ADLs/Self Care Home Management;Cryotherapy;Electrical Stimulation;Moist Heat;Traction;Gait Scientist, forensic;Therapeutic activities;Therapeutic exercise;Balance training;Neuromuscular re-education;Manual techniques;Passive range of motion;Dry needling;Joint Manipulations;Spinal Manipulations;Taping    PT Next Visit Plan balance, leg strength, endurance, pball core    PT Home Exercise Plan tandem balance, SLS, squats, sit to stands    Consulted and Agree with Plan of Care Patient           Patient will benefit from skilled therapeutic intervention in order to improve the following deficits and impairments:  Decreased activity tolerance,Decreased balance,Decreased endurance,Decreased mobility,Decreased range of motion,Decreased strength,Difficulty walking,Increased muscle spasms,Impaired flexibility,Postural dysfunction,Improper body mechanics,Pain  Visit Diagnosis: Unsteadiness on feet  Muscle weakness (generalized)  Hemiplegia and hemiparesis following nontraumatic intracerebral hemorrhage affecting left non-dominant side (HCC)  Other symptoms and signs involving the nervous system  Chronic bilateral low back pain without sciatica     Problem List Patient Active Problem List   Diagnosis Date Noted  . Intermediate stage nonexudative age-related macular degeneration of right eye 05/08/2020  . Exudative age-related macular degeneration of left eye with active choroidal neovascularization ([place]) 10/11/2019  . Intermediate stage nonexudative age-related  macular degeneration of both eyes 10/11/2019  . Operculated retinal tear of left eye 10/11/2019  . Right posterior capsular opacification 10/11/2019  . Posterior vitreous detachment of left eye 10/11/2019  . Paving stone retinal degeneration of right eye 10/11/2019  . Calculus of kidney 03/12/2018  . Bleeding internal hemorrhoids 10/09/2017  . Sleep apnea 09/22/2017  . History of adenomatous polyp of colon 09/18/2017  . Prostate cancer ([place]) 09/18/2017  . Essential hypertension 08/26/2017  . Hyperlipidemia 08/26/2017  . Cerebral venous thrombosis 07/13/2017  . Intracerebral hemorrhage 07/10/2017  . ICH (intracerebral hemorrhage) ([place]) 07/10/2017  . Unstable angina ([place]) 03/12/2013    Debbe Odea, PT,DPT 07/27/2020, 9:39 AM  [facility] [address] [place], [place], [address] Phone: [phone]   Fax:  [phone]  Name: Justin Adams MRN: [id number] Date of Birth: 1945/11/28

## 2020-08-01 ENCOUNTER — Other Ambulatory Visit: Payer: Self-pay

## 2020-08-01 ENCOUNTER — Ambulatory Visit (INDEPENDENT_AMBULATORY_CARE_PROVIDER_SITE_OTHER): Payer: Medicare Other | Admitting: Physical Therapy

## 2020-08-01 DIAGNOSIS — G8929 Other chronic pain: Secondary | ICD-10-CM | POA: Diagnosis not present

## 2020-08-01 DIAGNOSIS — I69154 Hemiplegia and hemiparesis following nontraumatic intracerebral hemorrhage affecting left non-dominant side: Secondary | ICD-10-CM

## 2020-08-01 DIAGNOSIS — M6281 Muscle weakness (generalized): Secondary | ICD-10-CM | POA: Diagnosis not present

## 2020-08-01 DIAGNOSIS — R29818 Other symptoms and signs involving the nervous system: Secondary | ICD-10-CM | POA: Diagnosis not present

## 2020-08-01 DIAGNOSIS — M545 Low back pain, unspecified: Secondary | ICD-10-CM

## 2020-08-01 DIAGNOSIS — R2681 Unsteadiness on feet: Secondary | ICD-10-CM | POA: Diagnosis not present

## 2020-08-01 NOTE — Therapy (Signed)
[place], [place], [address] Phone: [phone]   Fax:  [phone]  Physical Therapy Treatment  Patient Details  Name: BRACKEN MOFFA MRN: [id number] Date of Birth: 1946/01/31 Referring Provider (PT): Stoneking MD   Encounter Date: 08/01/2020   PT End of Session - 08/01/20 0842    Visit Number 24    Number of Visits 30    Date for PT Re-Evaluation 09/07/20    Authorization Type MCR    Progress Note Due on Visit 30    PT Start Time 0803    PT Stop Time 0845    PT Time Calculation (min) 42 min    Activity Tolerance Patient tolerated treatment well    Behavior During Therapy [facility] for tasks assessed/performed           Past Medical History:  Diagnosis Date  . Adenomatous polyp   . Bleeding internal hemorrhoids 10/2017  . Cerebral venous thrombosis   . Grade I diastolic dysfunction 02/77/4128   noted on ECHO   . Hemorrhoids   . History of kidney stones   . Hypercholesteremia   . Hypertension   . Intracranial hemorrhage (HCC)    Right temporal lobe  . LVH (left ventricular hypertrophy) 07/11/2017   Mild, noted on ECHO   . Muscular degeneration   . Nephrolithiasis   . Obesity   . OSA (obstructive sleep apnea)   . Polycythemia   . Prostate cancer ([place]) 2003  . Shingles   . Stroke ([place]) 06/2017   No residual  . Unsteady gait   . Ureteral calculi 02/12/2008   Left    Past Surgical History:  Procedure Laterality Date  . APPENDECTOMY     childhood  . CHOLECYSTECTOMY    . COLONOSCOPY    . HEMORRHOID BANDING  2015  . HOLMIUM LASER APPLICATION Left 78/11/7670   Procedure: HOLMIUM LASER APPLICATION;  Surgeon: Franchot Gallo, MD;  Location: [facility];  Service: Urology;  Laterality: Left;  . IR ANGIO EXTERNAL CAROTID SEL EXT CAROTID UNI R MOD SED  07/10/2017  . IR ANGIO INTRA EXTRACRAN SEL INTERNAL CAROTID BILAT MOD SED  07/10/2017  . IR ANGIO VERTEBRAL SEL VERTEBRAL BILAT MOD SED  07/10/2017  . IR  URETERAL STENT LEFT NEW ACCESS W/O SEP NEPHROSTOMY CATH  03/12/2018  . LEFT HEART CATHETERIZATION WITH CORONARY ANGIOGRAM N/A 03/15/2013   Procedure: LEFT HEART CATHETERIZATION WITH CORONARY ANGIOGRAM;  Surgeon: Minus Breeding, MD;  Location: [facility] CATH LAB;  Service: Cardiovascular;  Laterality: N/A;  . NEPHROLITHOTOMY Left 03/12/2018   Procedure: LEFT NEPHROLITHOTOMY PERCUTANEOUS, STENT PLACEMENT;  Surgeon: Franchot Gallo, MD;  Location: [facility];  Service: Urology;  Laterality: Left;  . PROSTATECTOMY    . STONE EXTRACTION WITH BASKET      There were no vitals filed for this visit.   Subjective Assessment - 08/01/20 0839    Subjective he relays his back is no longer having pain upon waking up in the morning.    Limitations Lifting;House hold activities    Patient Stated Goals reduce pain, improve balance    Pain Onset More than a month ago              [facility] PT Assessment - 08/01/20 0001      Assessment   Medical Diagnosis LBP, balance, general weakness    Referring Provider (PT) [place] MD      AROM   Lumbar Flexion 75%    Lumbar Extension 25%    Lumbar -  Right Side Bend 75%    Lumbar - Left Side Bend 75%    Lumbar - Right Rotation 75%    Lumbar - Left Rotation 75%      Strength   Right Hip Flexion 4+/5    Right Hip ABduction 4+/5    Left Hip Flexion 4+/5    Left Hip ABduction 4+/5    Right Knee Flexion 5/5    Right Knee Extension 5/5    Left Knee Flexion 5/5    Left Knee Extension 5/5            [facility] Adult PT Treatment/Exercise - 08/01/20 0001      Neuro Re-ed    Neuro Re-ed Details  tandem walking, SLS      Lumbar Exercises: Stretches   Double Knee to Chest Stretch Limitations 5 reps 10 seconds with feet on pball    Lower Trunk Rotation 5 reps;10 seconds    Other Lumbar Stretch Exercise seated pball roll outs 10 sec X 10 for lumbar flexion       Lumbar Exercises: Aerobic   Recumbent Bike L3 X 8 min      Lumbar Exercises: Standing   Lifting  Limitations deadlift with blue band X 20 reps    Other Standing Lumbar Exercises suitcase carry 15 lbs one lap ea side    Other Standing Lumbar Exercises Tband rows and extensions blue 2X15 ea      Lumbar Exercises: Seated   Sit to Stand Limitations 3 sets of 5 from raised mat surface      Lumbar Exercises: Supine   Bridge Limitations 2 sets of 10 5 sec holds feet on pball    Straight Leg Raises Limitations 2 sets of 10                    PT Short Term Goals - 06/01/20 0904      PT SHORT TERM GOAL #1   Title Pt will report performing walking program, HEP and [employer] 2-3 days/week    Baseline relays he averages 2 times per week on 12/23    Time 4    Period Weeks    Status Achieved    Target Date 04/20/20      PT SHORT TERM GOAL #2   Title Pt will improve functional LE strength decreasing five time sit to stand to </= 15 seconds    Baseline 14.99 on 12/23    Time 4    Period Weeks    Status Achieved             PT Long Term Goals - 07/18/20 1779      PT LONG TERM GOAL #1   Title Pt will improve 6 minute walk test by 160 ft(one extra lap) to show improved endurance and gait speed.    Baseline improved 168 ft so 1320 ft total on 12/23    Time 12    Period Weeks    Status Achieved      PT LONG TERM GOAL #2   Title Pt will increaese overall leg strength to at least 4+ to 5- overall to improve function    Baseline now 4+ to 5    Time 12    Period Weeks    Status Achieved      PT LONG TERM GOAL #3   Title Pt will improve 5 times sit to stand test to less than 13 seconds    Baseline 13.3 on 2/8  Time 12    Period Weeks    Status On-going      PT LONG TERM GOAL #4   Title Pt will be able SLS goal to 30 sec to show improved balance    Baseline 20 sec at best but 9 sec avg on Lt and 15 sec avg on Rt on 2/8    Status On-going      PT LONG TERM GOAL #5   Title -                 Plan - 08/01/20 0844    Clinical Impression Statement His  low back pain has improved and only has back pain if he stands or walks too long. Hopefully this will continue to improve with strength and conditioning. Continue POC.    Personal Factors and Comorbidities Comorbidity 3+    Comorbidities CVA, sleep apnea, chronic LBP, history of falls    Examination-Activity Limitations Carry;Bend;Bed Mobility;Dressing;Lift;Stand;Stairs;Squat;Sleep;Locomotion Level    Examination-Participation Restrictions Cleaning;Community Activity;Driving;Laundry;Yard Work;Volunteer    Stability/Clinical Decision Making Evolving/Moderate complexity    Rehab Potential Good    PT Frequency 2x / week   1-2   PT Duration 12 weeks    PT Treatment/Interventions ADLs/Self Care Home Management;Cryotherapy;Electrical Stimulation;Moist Heat;Traction;Gait Scientist, forensic;Therapeutic activities;Therapeutic exercise;Balance training;Neuromuscular re-education;Manual techniques;Passive range of motion;Dry needling;Joint Manipulations;Spinal Manipulations;Taping    PT Next Visit Plan balance, leg strength, endurance, pball core    PT Home Exercise Plan tandem balance, SLS, squats, sit to stands    Consulted and Agree with Plan of Care Patient           Patient will benefit from skilled therapeutic intervention in order to improve the following deficits and impairments:  Decreased activity tolerance,Decreased balance,Decreased endurance,Decreased mobility,Decreased range of motion,Decreased strength,Difficulty walking,Increased muscle spasms,Impaired flexibility,Postural dysfunction,Improper body mechanics,Pain  Visit Diagnosis: Unsteadiness on feet  Muscle weakness (generalized)  Hemiplegia and hemiparesis following nontraumatic intracerebral hemorrhage affecting left non-dominant side (HCC)  Other symptoms and signs involving the nervous system  Chronic bilateral low back pain without sciatica     Problem List Patient Active Problem List   Diagnosis Date Noted  .  Intermediate stage nonexudative age-related macular degeneration of right eye 05/08/2020  . Exudative age-related macular degeneration of left eye with active choroidal neovascularization ([place]) 10/11/2019  . Intermediate stage nonexudative age-related macular degeneration of both eyes 10/11/2019  . Operculated retinal tear of left eye 10/11/2019  . Right posterior capsular opacification 10/11/2019  . Posterior vitreous detachment of left eye 10/11/2019  . Paving stone retinal degeneration of right eye 10/11/2019  . Calculus of kidney 03/12/2018  . Bleeding internal hemorrhoids 10/09/2017  . Sleep apnea 09/22/2017  . History of adenomatous polyp of colon 09/18/2017  . Prostate cancer ([place]) 09/18/2017  . Essential hypertension 08/26/2017  . Hyperlipidemia 08/26/2017  . Cerebral venous thrombosis 07/13/2017  . Intracerebral hemorrhage 07/10/2017  . ICH (intracerebral hemorrhage) ([place]) 07/10/2017  . Unstable angina ([place]) 03/12/2013    Debbe Odea , PT,DPT 08/01/2020, 8:51 AM  [facility] Physical Therapy [address] [place], [place], [address] Phone: [phone]   Fax:  [phone]  Name: ARJUN HARD MRN: [id number] Date of Birth: 1945-11-18

## 2020-08-03 ENCOUNTER — Other Ambulatory Visit: Payer: Self-pay

## 2020-08-03 ENCOUNTER — Ambulatory Visit (INDEPENDENT_AMBULATORY_CARE_PROVIDER_SITE_OTHER): Payer: Medicare Other | Admitting: Physical Therapy

## 2020-08-03 DIAGNOSIS — G8929 Other chronic pain: Secondary | ICD-10-CM | POA: Diagnosis not present

## 2020-08-03 DIAGNOSIS — M545 Low back pain, unspecified: Secondary | ICD-10-CM | POA: Diagnosis not present

## 2020-08-03 DIAGNOSIS — I69154 Hemiplegia and hemiparesis following nontraumatic intracerebral hemorrhage affecting left non-dominant side: Secondary | ICD-10-CM

## 2020-08-03 DIAGNOSIS — R29818 Other symptoms and signs involving the nervous system: Secondary | ICD-10-CM | POA: Diagnosis not present

## 2020-08-03 DIAGNOSIS — R2681 Unsteadiness on feet: Secondary | ICD-10-CM

## 2020-08-03 DIAGNOSIS — M6281 Muscle weakness (generalized): Secondary | ICD-10-CM

## 2020-08-03 NOTE — Therapy (Signed)
[facility] Physical Therapy [address] [place], [place], [address] Phone: [phone]   Fax:  [phone]  Physical Therapy Treatment  Patient Details  Name: Justin Adams MRN: [id number] Date of Birth: 1945/09/02 Referring Provider (PT): Stoneking MD   Encounter Date: 08/03/2020   PT End of Session - 08/03/20 0849    Visit Number 25    Number of Visits 30    Date for PT Re-Evaluation 09/07/20    Authorization Type MCR    Progress Note Due on Visit 30    PT Start Time 0800    PT Stop Time 0845    PT Time Calculation (min) 45 min    Activity Tolerance Patient tolerated treatment well    Behavior During Therapy [facility] for tasks assessed/performed           Past Medical History:  Diagnosis Date  . Adenomatous polyp   . Bleeding internal hemorrhoids 10/2017  . Cerebral venous thrombosis   . Grade I diastolic dysfunction 86/76/7209   noted on ECHO   . Hemorrhoids   . History of kidney stones   . Hypercholesteremia   . Hypertension   . Intracranial hemorrhage (HCC)    Right temporal lobe  . LVH (left ventricular hypertrophy) 07/11/2017   Mild, noted on ECHO   . Muscular degeneration   . Nephrolithiasis   . Obesity   . OSA (obstructive sleep apnea)   . Polycythemia   . Prostate cancer ([place]) 2003  . Shingles   . Stroke ([place]) 06/2017   No residual  . Unsteady gait   . Ureteral calculi 02/12/2008   Left    Past Surgical History:  Procedure Laterality Date  . APPENDECTOMY     childhood  . CHOLECYSTECTOMY    . COLONOSCOPY    . HEMORRHOID BANDING  2015  . HOLMIUM LASER APPLICATION Left 47/0/9628   Procedure: HOLMIUM LASER APPLICATION;  Surgeon: Franchot Gallo, MD;  Location: [facility];  Service: Urology;  Laterality: Left;  . IR ANGIO EXTERNAL CAROTID SEL EXT CAROTID UNI R MOD SED  07/10/2017  . IR ANGIO INTRA EXTRACRAN SEL INTERNAL CAROTID BILAT MOD SED  07/10/2017  . IR ANGIO VERTEBRAL SEL VERTEBRAL BILAT MOD SED  07/10/2017  . IR  URETERAL STENT LEFT NEW ACCESS W/O SEP NEPHROSTOMY CATH  03/12/2018  . LEFT HEART CATHETERIZATION WITH CORONARY ANGIOGRAM N/A 03/15/2013   Procedure: LEFT HEART CATHETERIZATION WITH CORONARY ANGIOGRAM;  Surgeon: Minus Breeding, MD;  Location: [facility] CATH LAB;  Service: Cardiovascular;  Laterality: N/A;  . NEPHROLITHOTOMY Left 03/12/2018   Procedure: LEFT NEPHROLITHOTOMY PERCUTANEOUS, STENT PLACEMENT;  Surgeon: Franchot Gallo, MD;  Location: [facility];  Service: Urology;  Laterality: Left;  . PROSTATECTOMY    . STONE EXTRACTION WITH BASKET      There were no vitals filed for this visit.   Subjective Assessment - 08/03/20 0809    Subjective he has no pain to report upon arrival in his back, occasional pain in his knees, denies any falls    Limitations Lifting;House hold activities    Patient Stated Goals reduce pain, improve balance    Pain Onset More than a month ago           [facility] Adult PT Treatment/Exercise - 08/03/20 0001      Neuro Re-ed    Neuro Re-ed Details  SLS, marching on foam      Lumbar Exercises: Stretches   Double Knee to Chest Stretch Limitations 5 reps 10 seconds with feet on pball  Lower Trunk Rotation 5 reps;10 seconds    Other Lumbar Stretch Exercise seated pball roll outs 10 sec X 10 for lumbar flexion       Lumbar Exercises: Aerobic   Recumbent Bike L3 X 8 min      Lumbar Exercises: Machines for Strengthening   Leg Press 106 lbs 3 sets of 10    Other Lumbar Machine Exercise row machine 35 lbs 3X10 reps    Other Lumbar Machine Exercise lat pull 35 lbs 2X15 reps      Lumbar Exercises: Standing   Lifting Limitations deadlift with blue band X 20 reps    Other Standing Lumbar Exercises suitcase carry 15 lbs one lap ea side      Lumbar Exercises: Seated   Sit to Stand Limitations 3 sets of 5 from raised mat surface      Lumbar Exercises: Supine   Bridge Limitations 2 sets of 10 5 sec holds feet on pball    Straight Leg Raises Limitations 2 sets of 10                     PT Short Term Goals - 06/01/20 0904      PT SHORT TERM GOAL #1   Title Pt will report performing walking program, HEP and [employer] 2-3 days/week    Baseline relays he averages 2 times per week on 12/23    Time 4    Period Weeks    Status Achieved    Target Date 04/20/20      PT SHORT TERM GOAL #2   Title Pt will improve functional LE strength decreasing five time sit to stand to </= 15 seconds    Baseline 14.99 on 12/23    Time 4    Period Weeks    Status Achieved             PT Long Term Goals - 07/18/20 3329      PT LONG TERM GOAL #1   Title Pt will improve 6 minute walk test by 160 ft(one extra lap) to show improved endurance and gait speed.    Baseline improved 168 ft so 1320 ft total on 12/23    Time 12    Period Weeks    Status Achieved      PT LONG TERM GOAL #2   Title Pt will increaese overall leg strength to at least 4+ to 5- overall to improve function    Baseline now 4+ to 5    Time 12    Period Weeks    Status Achieved      PT LONG TERM GOAL #3   Title Pt will improve 5 times sit to stand test to less than 13 seconds    Baseline 13.3 on 2/8    Time 12    Period Weeks    Status On-going      PT LONG TERM GOAL #4   Title Pt will be able SLS goal to 30 sec to show improved balance    Baseline 20 sec at best but 9 sec avg on Lt and 15 sec avg on Rt on 2/8    Status On-going      PT LONG TERM GOAL #5   Title -                 Plan - 08/03/20 0850    Clinical Impression Statement Overall continues to progress with balance, pain, leg strenght and endurance. Still with mild  deficits in these areas and we will continue to work to improve these and decrease his overall risk of falling.    Personal Factors and Comorbidities Comorbidity 3+    Comorbidities CVA, sleep apnea, chronic LBP, history of falls    Examination-Activity Limitations Carry;Bend;Bed Mobility;Dressing;Lift;Stand;Stairs;Squat;Sleep;Locomotion Level     Examination-Participation Restrictions Cleaning;Community Activity;Driving;Laundry;Yard Work;Volunteer    Stability/Clinical Decision Making Evolving/Moderate complexity    Rehab Potential Good    PT Frequency 2x / week   1-2   PT Duration 12 weeks    PT Treatment/Interventions ADLs/Self Care Home Management;Cryotherapy;Electrical Stimulation;Moist Heat;Traction;Gait Scientist, forensic;Therapeutic activities;Therapeutic exercise;Balance training;Neuromuscular re-education;Manual techniques;Passive range of motion;Dry needling;Joint Manipulations;Spinal Manipulations;Taping    PT Next Visit Plan balance, leg strength, endurance, pball core    PT Home Exercise Plan tandem balance, SLS, squats, sit to stands    Consulted and Agree with Plan of Care Patient           Patient will benefit from skilled therapeutic intervention in order to improve the following deficits and impairments:  Decreased activity tolerance,Decreased balance,Decreased endurance,Decreased mobility,Decreased range of motion,Decreased strength,Difficulty walking,Increased muscle spasms,Impaired flexibility,Postural dysfunction,Improper body mechanics,Pain  Visit Diagnosis: Unsteadiness on feet  Muscle weakness (generalized)  Hemiplegia and hemiparesis following nontraumatic intracerebral hemorrhage affecting left non-dominant side (HCC)  Other symptoms and signs involving the nervous system  Chronic bilateral low back pain without sciatica     Problem List Patient Active Problem List   Diagnosis Date Noted  . Intermediate stage nonexudative age-related macular degeneration of right eye 05/08/2020  . Exudative age-related macular degeneration of left eye with active choroidal neovascularization ([place]) 10/11/2019  . Intermediate stage nonexudative age-related macular degeneration of both eyes 10/11/2019  . Operculated retinal tear of left eye 10/11/2019  . Right posterior capsular opacification 10/11/2019  .  Posterior vitreous detachment of left eye 10/11/2019  . Paving stone retinal degeneration of right eye 10/11/2019  . Calculus of kidney 03/12/2018  . Bleeding internal hemorrhoids 10/09/2017  . Sleep apnea 09/22/2017  . History of adenomatous polyp of colon 09/18/2017  . Prostate cancer ([place]) 09/18/2017  . Essential hypertension 08/26/2017  . Hyperlipidemia 08/26/2017  . Cerebral venous thrombosis 07/13/2017  . Intracerebral hemorrhage 07/10/2017  . ICH (intracerebral hemorrhage) ([place]) 07/10/2017  . Unstable angina ([place]) 03/12/2013    Debbe Odea, PT,DPT 08/03/2020, 8:51 AM  [facility] Physical Therapy [address] [place], [place], [address] Phone: [phone]   Fax:  [phone]  Name: JHONNIE ALIANO MRN: [id number] Date of Birth: 09-11-1945

## 2020-08-04 DIAGNOSIS — N200 Calculus of kidney: Secondary | ICD-10-CM | POA: Diagnosis not present

## 2020-08-08 ENCOUNTER — Other Ambulatory Visit: Payer: Self-pay

## 2020-08-08 ENCOUNTER — Ambulatory Visit (INDEPENDENT_AMBULATORY_CARE_PROVIDER_SITE_OTHER): Payer: Medicare Other | Admitting: Physical Therapy

## 2020-08-08 DIAGNOSIS — I69154 Hemiplegia and hemiparesis following nontraumatic intracerebral hemorrhage affecting left non-dominant side: Secondary | ICD-10-CM | POA: Diagnosis not present

## 2020-08-08 DIAGNOSIS — G8929 Other chronic pain: Secondary | ICD-10-CM

## 2020-08-08 DIAGNOSIS — M545 Low back pain, unspecified: Secondary | ICD-10-CM

## 2020-08-08 DIAGNOSIS — R29818 Other symptoms and signs involving the nervous system: Secondary | ICD-10-CM

## 2020-08-08 DIAGNOSIS — R2681 Unsteadiness on feet: Secondary | ICD-10-CM

## 2020-08-08 DIAGNOSIS — M6281 Muscle weakness (generalized): Secondary | ICD-10-CM | POA: Diagnosis not present

## 2020-08-08 NOTE — Therapy (Signed)
[place], [place], [address] Phone: [phone]   Fax:  [phone]  Physical Therapy Treatment  Patient Details  Name: Justin Adams MRN: [id number] Date of Birth: 1946-02-29 Referring Provider (PT): Stoneking MD   Encounter Date: 08/08/2020   PT End of Session - 08/08/20 0856    Visit Number 26    Number of Visits 30    Date for PT Re-Evaluation 09/07/20    Authorization Type MCR    Progress Note Due on Visit 30    PT Start Time 0803    PT Stop Time 0846    PT Time Calculation (min) 43 min    Activity Tolerance Patient tolerated treatment well    Behavior During Therapy [facility] for tasks assessed/performed           Past Medical History:  Diagnosis Date  . Adenomatous polyp   . Bleeding internal hemorrhoids 10/2017  . Cerebral venous thrombosis   . Grade I diastolic dysfunction 51/76/1607   noted on ECHO   . Hemorrhoids   . History of kidney stones   . Hypercholesteremia   . Hypertension   . Intracranial hemorrhage (HCC)    Right temporal lobe  . LVH (left ventricular hypertrophy) 07/11/2017   Mild, noted on ECHO   . Muscular degeneration   . Nephrolithiasis   . Obesity   . OSA (obstructive sleep apnea)   . Polycythemia   . Prostate cancer ([place]) 2003  . Shingles   . Stroke ([place]) 06/2017   No residual  . Unsteady gait   . Ureteral calculi 02/12/2008   Left    Past Surgical History:  Procedure Laterality Date  . APPENDECTOMY     childhood  . CHOLECYSTECTOMY    . COLONOSCOPY    . HEMORRHOID BANDING  2015  . HOLMIUM LASER APPLICATION Left 37/06/624   Procedure: HOLMIUM LASER APPLICATION;  Surgeon: Franchot Gallo, MD;  Location: [facility];  Service: Urology;  Laterality: Left;  . IR ANGIO EXTERNAL CAROTID SEL EXT CAROTID UNI R MOD SED  07/10/2017  . IR ANGIO INTRA EXTRACRAN SEL INTERNAL CAROTID BILAT MOD SED  07/10/2017  . IR ANGIO VERTEBRAL SEL VERTEBRAL BILAT MOD SED  07/10/2017  . IR  URETERAL STENT LEFT NEW ACCESS W/O SEP NEPHROSTOMY CATH  03/12/2018  . LEFT HEART CATHETERIZATION WITH CORONARY ANGIOGRAM N/A 03/15/2013   Procedure: LEFT HEART CATHETERIZATION WITH CORONARY ANGIOGRAM;  Surgeon: Minus Breeding, MD;  Location: [facility] CATH LAB;  Service: Cardiovascular;  Laterality: N/A;  . NEPHROLITHOTOMY Left 03/12/2018   Procedure: LEFT NEPHROLITHOTOMY PERCUTANEOUS, STENT PLACEMENT;  Surgeon: Franchot Gallo, MD;  Location: [facility];  Service: Urology;  Laterality: Left;  . PROSTATECTOMY    . STONE EXTRACTION WITH BASKET      There were no vitals filed for this visit.   Subjective Assessment - 08/08/20 0854    Subjective he relays more knee pain and sorness today about 4/10 due to attending a hockey game and had to walk up/down many steps/stairs    Limitations Lifting;House hold activities    Patient Stated Goals reduce pain, improve balance    Pain Onset More than a month ago            [facility] Adult PT Treatment/Exercise - 08/08/20 0001      Neuro Re-ed    Neuro Re-ed Details  SLS, tandem walk fwd and retro      Lumbar Exercises: Stretches   Double Knee to Chest Stretch Limitations 5  reps 10 seconds with feet on pball    Lower Trunk Rotation 5 reps;10 seconds    Other Lumbar Stretch Exercise seated pball roll outs 10 sec X 10 for lumbar flexion       Lumbar Exercises: Aerobic   Recumbent Bike L1 X 8 min      Lumbar Exercises: Machines for Strengthening   Leg Press 106 lbs 3 sets of 10    Other Lumbar Machine Exercise row machine 35 lbs 3X10 reps    Other Lumbar Machine Exercise lat pull 35 lbs 3X10 reps      Lumbar Exercises: Standing   Lifting Limitations deadlift with blue band X 20 reps    Other Standing Lumbar Exercises suitcase carry 15 lbs one lap ea side      Lumbar Exercises: Seated   Sit to Stand Limitations 3 sets of 5 from raised mat surface      Lumbar Exercises: Supine   Bridge Limitations 2 sets of 10 5 sec holds feet on pball    Straight Leg  Raises Limitations 2 sets of 10                    PT Short Term Goals - 06/01/20 0904      PT SHORT TERM GOAL #1   Title Pt will report performing walking program, HEP and [employer] 2-3 days/week    Baseline relays he averages 2 times per week on 12/23    Time 4    Period Weeks    Status Achieved    Target Date 04/20/20      PT SHORT TERM GOAL #2   Title Pt will improve functional LE strength decreasing five time sit to stand to </= 15 seconds    Baseline 14.99 on 12/23    Time 4    Period Weeks    Status Achieved             PT Long Term Goals - 07/18/20 5102      PT LONG TERM GOAL #1   Title Pt will improve 6 minute walk test by 160 ft(one extra lap) to show improved endurance and gait speed.    Baseline improved 168 ft so 1320 ft total on 12/23    Time 12    Period Weeks    Status Achieved      PT LONG TERM GOAL #2   Title Pt will increaese overall leg strength to at least 4+ to 5- overall to improve function    Baseline now 4+ to 5    Time 12    Period Weeks    Status Achieved      PT LONG TERM GOAL #3   Title Pt will improve 5 times sit to stand test to less than 13 seconds    Baseline 13.3 on 2/8    Time 12    Period Weeks    Status On-going      PT LONG TERM GOAL #4   Title Pt will be able SLS goal to 30 sec to show improved balance    Baseline 20 sec at best but 9 sec avg on Lt and 15 sec avg on Rt on 2/8    Status On-going      PT LONG TERM GOAL #5   Title -                 Plan - 08/08/20 0857    Clinical Impression Statement He was ale to  progress balance today with tandem walk to retro tandem walk with fair balance overall with this. He had more knee pain overall so backed down some on his leg strengthening today. PT will continue to progress as able to improve his overall functional abilities.    Personal Factors and Comorbidities Comorbidity 3+    Comorbidities CVA, sleep apnea, chronic LBP, history of falls     Examination-Activity Limitations Carry;Bend;Bed Mobility;Dressing;Lift;Stand;Stairs;Squat;Sleep;Locomotion Level    Examination-Participation Restrictions Cleaning;Community Activity;Driving;Laundry;Yard Work;Volunteer    Stability/Clinical Decision Making Evolving/Moderate complexity    Rehab Potential Good    PT Frequency 2x / week   1-2   PT Duration 12 weeks    PT Treatment/Interventions ADLs/Self Care Home Management;Cryotherapy;Electrical Stimulation;Moist Heat;Traction;Gait Scientist, forensic;Therapeutic activities;Therapeutic exercise;Balance training;Neuromuscular re-education;Manual techniques;Passive range of motion;Dry needling;Joint Manipulations;Spinal Manipulations;Taping    PT Next Visit Plan balance, leg strength, endurance, pball core    PT Home Exercise Plan tandem balance, SLS, squats, sit to stands    Consulted and Agree with Plan of Care Patient           Patient will benefit from skilled therapeutic intervention in order to improve the following deficits and impairments:  Decreased activity tolerance,Decreased balance,Decreased endurance,Decreased mobility,Decreased range of motion,Decreased strength,Difficulty walking,Increased muscle spasms,Impaired flexibility,Postural dysfunction,Improper body mechanics,Pain  Visit Diagnosis: Unsteadiness on feet  Muscle weakness (generalized)  Hemiplegia and hemiparesis following nontraumatic intracerebral hemorrhage affecting left non-dominant side (HCC)  Other symptoms and signs involving the nervous system  Chronic bilateral low back pain without sciatica     Problem List Patient Active Problem List   Diagnosis Date Noted  . Intermediate stage nonexudative age-related macular degeneration of right eye 05/08/2020  . Exudative age-related macular degeneration of left eye with active choroidal neovascularization ([place]) 10/11/2019  . Intermediate stage nonexudative age-related macular degeneration of both eyes  10/11/2019  . Operculated retinal tear of left eye 10/11/2019  . Right posterior capsular opacification 10/11/2019  . Posterior vitreous detachment of left eye 10/11/2019  . Paving stone retinal degeneration of right eye 10/11/2019  . Calculus of kidney 03/12/2018  . Bleeding internal hemorrhoids 10/09/2017  . Sleep apnea 09/22/2017  . History of adenomatous polyp of colon 09/18/2017  . Prostate cancer ([place]) 09/18/2017  . Essential hypertension 08/26/2017  . Hyperlipidemia 08/26/2017  . Cerebral venous thrombosis 07/13/2017  . Intracerebral hemorrhage 07/10/2017  . ICH (intracerebral hemorrhage) ([place]) 07/10/2017  . Unstable angina ([place]) 03/12/2013    Debbe Odea, PT,DPT 08/08/2020, 9:01 AM  [facility] Physical Therapy [address] [place], [place], [address] Phone: [phone]   Fax:  [phone]  Name: Justin Adams MRN: [id number] Date of Birth: [birth year]

## 2020-08-10 ENCOUNTER — Encounter: Payer: Medicare Other | Admitting: Physical Therapy

## 2020-08-15 ENCOUNTER — Ambulatory Visit (INDEPENDENT_AMBULATORY_CARE_PROVIDER_SITE_OTHER): Payer: Medicare Other | Admitting: Physical Therapy

## 2020-08-15 ENCOUNTER — Other Ambulatory Visit: Payer: Self-pay

## 2020-08-15 ENCOUNTER — Encounter: Payer: Self-pay | Admitting: Physical Therapy

## 2020-08-15 DIAGNOSIS — R29818 Other symptoms and signs involving the nervous system: Secondary | ICD-10-CM | POA: Diagnosis not present

## 2020-08-15 DIAGNOSIS — M6281 Muscle weakness (generalized): Secondary | ICD-10-CM | POA: Diagnosis not present

## 2020-08-15 DIAGNOSIS — G8929 Other chronic pain: Secondary | ICD-10-CM | POA: Diagnosis not present

## 2020-08-15 DIAGNOSIS — R2681 Unsteadiness on feet: Secondary | ICD-10-CM | POA: Diagnosis not present

## 2020-08-15 DIAGNOSIS — M545 Low back pain, unspecified: Secondary | ICD-10-CM

## 2020-08-15 DIAGNOSIS — I69154 Hemiplegia and hemiparesis following nontraumatic intracerebral hemorrhage affecting left non-dominant side: Secondary | ICD-10-CM

## 2020-08-15 NOTE — Therapy (Signed)
[place], [place], [address] Phone: [phone]   Fax:  [phone]  Physical Therapy Treatment  Patient Details  Name: Justin Adams MRN: [id number] Date of Birth: 1945/07/22 Referring Provider (PT): Stoneking MD   Encounter Date: 08/15/2020   PT End of Session - 08/15/20 0849    Visit Number 27    Number of Visits 30    Date for PT Re-Evaluation 09/07/20    Authorization Type MCR    Progress Note Due on Visit 30    PT Start Time 0801    PT Stop Time 0845    PT Time Calculation (min) 44 min    Activity Tolerance Patient tolerated treatment well    Behavior During Therapy [facility] for tasks assessed/performed           Past Medical History:  Diagnosis Date  . Adenomatous polyp   . Bleeding internal hemorrhoids 10/2017  . Cerebral venous thrombosis   . Grade I diastolic dysfunction 95/28/4132   noted on ECHO   . Hemorrhoids   . History of kidney stones   . Hypercholesteremia   . Hypertension   . Intracranial hemorrhage (HCC)    Right temporal lobe  . LVH (left ventricular hypertrophy) 07/11/2017   Mild, noted on ECHO   . Muscular degeneration   . Nephrolithiasis   . Obesity   . OSA (obstructive sleep apnea)   . Polycythemia   . Prostate cancer ([place]) 2003  . Shingles   . Stroke ([place]) 06/2017   No residual  . Unsteady gait   . Ureteral calculi 02/12/2008   Left    Past Surgical History:  Procedure Laterality Date  . APPENDECTOMY     childhood  . CHOLECYSTECTOMY    . COLONOSCOPY    . HEMORRHOID BANDING  2015  . HOLMIUM LASER APPLICATION Left 44/0/1027   Procedure: HOLMIUM LASER APPLICATION;  Surgeon: Franchot Gallo, MD;  [facility];  Service: Urology;  Laterality: Left;  . IR ANGIO EXTERNAL CAROTID SEL EXT CAROTID UNI R MOD SED  07/10/2017  . IR ANGIO INTRA EXTRACRAN SEL INTERNAL CAROTID BILAT MOD SED  07/10/2017  . IR ANGIO VERTEBRAL SEL VERTEBRAL BILAT MOD SED  07/10/2017  . IR  URETERAL STENT LEFT NEW ACCESS W/O SEP NEPHROSTOMY CATH  03/12/2018  . LEFT HEART CATHETERIZATION WITH CORONARY ANGIOGRAM N/A 03/15/2013   Procedure: LEFT HEART CATHETERIZATION WITH CORONARY ANGIOGRAM;  Surgeon: Minus Breeding, MD;  Location: [facility] CATH LAB;  Service: Cardiovascular;  Laterality: N/A;  . NEPHROLITHOTOMY Left 03/12/2018   Procedure: LEFT NEPHROLITHOTOMY PERCUTANEOUS, STENT PLACEMENT;  Surgeon: Franchot Gallo, MD;  [facility];  Service: Urology;  Laterality: Left;  . PROSTATECTOMY    . STONE EXTRACTION WITH BASKET      There were no vitals filed for this visit.   Subjective Assessment - 08/15/20 0807    Subjective he relays more knee pain overall today 5/10, his back is overall been doing well, denies any falls.    Limitations Lifting;House hold activities    Patient Stated Goals reduce pain, improve balance    Pain Onset More than a month ago              [facility] PT Assessment - 08/15/20 0001      Strength   Right Hip Flexion 4+/5    Right Hip ABduction 4+/5    Left Hip Flexion 4+/5    Left Hip ABduction 4+/5    Right Knee Flexion 5/5  Right Knee Extension 5/5    Left Knee Flexion 5/5    Left Knee Extension 5/5            [facility] Adult PT Treatment/Exercise - 08/15/20 0001      Neuro Re-ed    Neuro Re-ed Details  SLS, tandem walk fwd and retro      Lumbar Exercises: Stretches   Double Knee to Chest Stretch Limitations 5 reps 10 seconds with feet on pball    Lower Trunk Rotation 5 reps;10 seconds    Other Lumbar Stretch Exercise seated pball roll outs 10 sec X 10 for lumbar flexion       Lumbar Exercises: Aerobic   Recumbent Bike L2 X 8 min      Lumbar Exercises: Machines for Strengthening   Leg Press 106 lbs 3 sets of 10    Other Lumbar Machine Exercise row machine 35 lbs 3X10 reps    Other Lumbar Machine Exercise lat pull 35 lbs 3X10 reps      Lumbar Exercises: Standing   Lifting Limitations deadlift with blue band X 20 reps    Other  Standing Lumbar Exercises suitcase carry 15 lbs one lap ea side      Lumbar Exercises: Seated   Sit to Stand Limitations 3 sets of 5 from raised mat surface      Lumbar Exercises: Supine   Bridge Limitations 2 sets of 10 5 sec holds feet on pball    Straight Leg Raises Limitations 2 sets of 10                    PT Short Term Goals - 06/01/20 0904      PT SHORT TERM GOAL #1   Title Pt will report performing walking program, HEP and [employer] 2-3 days/week    Baseline relays he averages 2 times per week on 12/23    Time 4    Period Weeks    Status Achieved    Target Date 04/20/20      PT SHORT TERM GOAL #2   Title Pt will improve functional LE strength decreasing five time sit to stand to </= 15 seconds    Baseline 14.99 on 12/23    Time 4    Period Weeks    Status Achieved             PT Long Term Goals - 07/18/20 9767      PT LONG TERM GOAL #1   Title Pt will improve 6 minute walk test by 160 ft(one extra lap) to show improved endurance and gait speed.    Baseline improved 168 ft so 1320 ft total on 12/23    Time 12    Period Weeks    Status Achieved      PT LONG TERM GOAL #2   Title Pt will increaese overall leg strength to at least 4+ to 5- overall to improve function    Baseline now 4+ to 5    Time 12    Period Weeks    Status Achieved      PT LONG TERM GOAL #3   Title Pt will improve 5 times sit to stand test to less than 13 seconds    Baseline 13.3 on 2/8    Time 12    Period Weeks    Status On-going      PT LONG TERM GOAL #4   Title Pt will be able SLS goal to 30 sec to show  improved balance    Baseline 20 sec at best but 9 sec avg on Lt and 15 sec avg on Rt on 2/8    Status On-going      PT LONG TERM GOAL #5   Title -                 Plan - 08/15/20 0850    Clinical Impression Statement Continued to work to address his funcitonal impairments in hip strenght, endurance, and overall pain. He was somewhat limited by knee  pain today. Continue POC    Personal Factors and Comorbidities Comorbidity 3+    Comorbidities CVA, sleep apnea, chronic LBP, history of falls    Examination-Activity Limitations Carry;Bend;Bed Mobility;Dressing;Lift;Stand;Stairs;Squat;Sleep;Locomotion Level    Examination-Participation Restrictions Cleaning;Community Activity;Driving;Laundry;Yard Work;Volunteer    Stability/Clinical Decision Making Evolving/Moderate complexity    Rehab Potential Good    PT Frequency 2x / week   1-2   PT Duration 12 weeks    PT Treatment/Interventions ADLs/Self Care Home Management;Cryotherapy;Electrical Stimulation;Moist Heat;Traction;Gait Scientist, forensic;Therapeutic activities;Therapeutic exercise;Balance training;Neuromuscular re-education;Manual techniques;Passive range of motion;Dry needling;Joint Manipulations;Spinal Manipulations;Taping    PT Next Visit Plan balance, leg strength, endurance, pball core    PT Home Exercise Plan tandem balance, SLS, squats, sit to stands    Consulted and Agree with Plan of Care Patient           Patient will benefit from skilled therapeutic intervention in order to improve the following deficits and impairments:  Decreased activity tolerance,Decreased balance,Decreased endurance,Decreased mobility,Decreased range of motion,Decreased strength,Difficulty walking,Increased muscle spasms,Impaired flexibility,Postural dysfunction,Improper body mechanics,Pain  Visit Diagnosis: Unsteadiness on feet  Muscle weakness (generalized)  Hemiplegia and hemiparesis following nontraumatic intracerebral hemorrhage affecting left non-dominant side (HCC)  Other symptoms and signs involving the nervous system  Chronic bilateral low back pain without sciatica     Problem List Patient Active Problem List   Diagnosis Date Noted  . Intermediate stage nonexudative age-related macular degeneration of right eye 05/08/2020  . Exudative age-related macular degeneration of left  eye with active choroidal neovascularization ([place]) 10/11/2019  . Intermediate stage nonexudative age-related macular degeneration of both eyes 10/11/2019  . Operculated retinal tear of left eye 10/11/2019  . Right posterior capsular opacification 10/11/2019  . Posterior vitreous detachment of left eye 10/11/2019  . Paving stone retinal degeneration of right eye 10/11/2019  . Calculus of kidney 03/12/2018  . Bleeding internal hemorrhoids 10/09/2017  . Sleep apnea 09/22/2017  . History of adenomatous polyp of colon 09/18/2017  . Prostate cancer ([place]) 09/18/2017  . Essential hypertension 08/26/2017  . Hyperlipidemia 08/26/2017  . Cerebral venous thrombosis 07/13/2017  . Intracerebral hemorrhage 07/10/2017  . ICH (intracerebral hemorrhage) ([place]) 07/10/2017  . Unstable angina [facility]) 03/12/2013    Silvestre Mesi 08/15/2020, 8:53 AM  [facility] Physical Therapy [address] [place], [place], [address] Phone: [phone]   Fax:  [phone]  Name: Justin Adams MRN: [id number] Date of Birth: [birth year]

## 2020-08-17 ENCOUNTER — Ambulatory Visit (INDEPENDENT_AMBULATORY_CARE_PROVIDER_SITE_OTHER): Payer: Medicare Other | Admitting: Physical Therapy

## 2020-08-17 ENCOUNTER — Other Ambulatory Visit: Payer: Self-pay

## 2020-08-17 DIAGNOSIS — R29818 Other symptoms and signs involving the nervous system: Secondary | ICD-10-CM | POA: Diagnosis not present

## 2020-08-17 DIAGNOSIS — M545 Low back pain, unspecified: Secondary | ICD-10-CM | POA: Diagnosis not present

## 2020-08-17 DIAGNOSIS — I69154 Hemiplegia and hemiparesis following nontraumatic intracerebral hemorrhage affecting left non-dominant side: Secondary | ICD-10-CM

## 2020-08-17 DIAGNOSIS — G8929 Other chronic pain: Secondary | ICD-10-CM | POA: Diagnosis not present

## 2020-08-17 DIAGNOSIS — M6281 Muscle weakness (generalized): Secondary | ICD-10-CM

## 2020-08-17 DIAGNOSIS — R2681 Unsteadiness on feet: Secondary | ICD-10-CM | POA: Diagnosis not present

## 2020-08-17 NOTE — Therapy (Signed)
[place], [place], [address] Phone: [phone]   Fax:  [phone]  Physical Therapy Treatment  Patient Details  Name: Justin Adams MRN: [id number] Date of Birth: [birth year] Referring Provider (PT): Stoneking MD   Encounter Date: 08/17/2020   PT End of Session - 08/17/20 0849    Visit Number 28    Number of Visits 30    Date for PT Re-Evaluation 09/07/20    Authorization Type MCR    Progress Note Due on Visit 30    PT Start Time 0801    PT Stop Time 0846    PT Time Calculation (min) 45 min    Activity Tolerance Patient tolerated treatment well    Behavior During Therapy [facility] for tasks assessed/performed           Past Medical History:  Diagnosis Date  . Adenomatous polyp   . Bleeding internal hemorrhoids 10/2017  . Cerebral venous thrombosis   . Grade I diastolic dysfunction 35/32/9924   noted on ECHO   . Hemorrhoids   . History of kidney stones   . Hypercholesteremia   . Hypertension   . Intracranial hemorrhage (HCC)    Right temporal lobe  . LVH (left ventricular hypertrophy) 07/11/2017   Mild, noted on ECHO   . Muscular degeneration   . Nephrolithiasis   . Obesity   . OSA (obstructive sleep apnea)   . Polycythemia   . Prostate cancer ([place]) 2003  . Shingles   . Stroke ([place]) 06/2017   No residual  . Unsteady gait   . Ureteral calculi 02/12/2008   Left    Past Surgical History:  Procedure Laterality Date  . APPENDECTOMY     childhood  . CHOLECYSTECTOMY    . COLONOSCOPY    . HEMORRHOID BANDING  2015  . HOLMIUM LASER APPLICATION Left 26/01/3418   Procedure: HOLMIUM LASER APPLICATION;  Surgeon: Franchot Gallo, MD;  Location: [facility];  Service: Urology;  Laterality: Left;  . IR ANGIO EXTERNAL CAROTID SEL EXT CAROTID UNI R MOD SED  07/10/2017  . IR ANGIO INTRA EXTRACRAN SEL INTERNAL CAROTID BILAT MOD SED  07/10/2017  . IR ANGIO VERTEBRAL SEL VERTEBRAL BILAT MOD SED  07/10/2017  . IR  URETERAL STENT LEFT NEW ACCESS W/O SEP NEPHROSTOMY CATH  03/12/2018  . LEFT HEART CATHETERIZATION WITH CORONARY ANGIOGRAM N/A 03/15/2013   Procedure: LEFT HEART CATHETERIZATION WITH CORONARY ANGIOGRAM;  Surgeon: Minus Breeding, MD;  Location: [facility] CATH LAB;  Service: Cardiovascular;  Laterality: N/A;  . NEPHROLITHOTOMY Left 03/12/2018   Procedure: LEFT NEPHROLITHOTOMY PERCUTANEOUS, STENT PLACEMENT;  Surgeon: Franchot Gallo, MD;  Location: [facility];  Service: Urology;  Laterality: Left;  . PROSTATECTOMY    . STONE EXTRACTION WITH BASKET      There were no vitals filed for this visit.   Subjective Assessment - 08/17/20 0812    Subjective he denies back pain upon arrival and states his knee pain is now less than 2/10.    Limitations Lifting;House hold activities    Patient Stated Goals reduce pain, improve balance    Pain Onset More than a month ago                             [facility] Adult PT Treatment/Exercise - 08/17/20 0001      Neuro Re-ed    Neuro Re-ed Details  SLS, tandem walk fwd and retro      Lumbar Exercises:  Stretches   Double Knee to Chest Stretch Limitations 5 reps 10 seconds with feet on pball    Lower Trunk Rotation 5 reps;10 seconds    Other Lumbar Stretch Exercise seated pball roll outs 10 sec X 10 for lumbar flexion       Lumbar Exercises: Aerobic   Recumbent Bike L3 X 8 min      Lumbar Exercises: Machines for Strengthening   Leg Press --    Other Lumbar Machine Exercise --    Other Lumbar Machine Exercise --      Lumbar Exercises: Standing   Lifting Limitations deadlift with blue band X 20 reps    Other Standing Lumbar Exercises suitcase carry 15 lbs one lap ea side    Other Standing Lumbar Exercises Tband rows and extensions blue 2X15 ea      Lumbar Exercises: Seated   Sit to Stand Limitations 3 sets of 5 from raised mat surface      Lumbar Exercises: Supine   Bridge Limitations 2 sets of 10 5 sec holds feet on pball    Straight Leg  Raises Limitations 2 sets of 10                    PT Short Term Goals - 06/01/20 0904      PT SHORT TERM GOAL #1   Title Pt will report performing walking program, HEP and [employer] 2-3 days/week    Baseline relays he averages 2 times per week on 12/23    Time 4    Period Weeks    Status Achieved    Target Date 04/20/20      PT SHORT TERM GOAL #2   Title Pt will improve functional LE strength decreasing five time sit to stand to </= 15 seconds    Baseline 14.99 on 12/23    Time 4    Period Weeks    Status Achieved             PT Long Term Goals - 07/18/20 8768      PT LONG TERM GOAL #1   Title Pt will improve 6 minute walk test by 160 ft(one extra lap) to show improved endurance and gait speed.    Baseline improved 168 ft so 1320 ft total on 12/23    Time 12    Period Weeks    Status Achieved      PT LONG TERM GOAL #2   Title Pt will increaese overall leg strength to at least 4+ to 5- overall to improve function    Baseline now 4+ to 5    Time 12    Period Weeks    Status Achieved      PT LONG TERM GOAL #3   Title Pt will improve 5 times sit to stand test to less than 13 seconds    Baseline 13.3 on 2/8    Time 12    Period Weeks    Status On-going      PT LONG TERM GOAL #4   Title Pt will be able SLS goal to 30 sec to show improved balance    Baseline 20 sec at best but 9 sec avg on Lt and 15 sec avg on Rt on 2/8    Status On-going      PT LONG TERM GOAL #5   Title -                 Plan - 08/17/20 1157  Clinical Impression Statement He had less overall knee pain today and had better tolerance to leg strengthening today. He continues to slowly improve his functional endurance and balance. He will likely be ready to discharge at the end of march if he continues to progress and does not have any set backs    Personal Factors and Comorbidities Comorbidity 3+    Comorbidities CVA, sleep apnea, chronic LBP, history of falls     Examination-Activity Limitations Carry;Bend;Bed Mobility;Dressing;Lift;Stand;Stairs;Squat;Sleep;Locomotion Level    Examination-Participation Restrictions Cleaning;Community Activity;Driving;Laundry;Yard Work;Volunteer    Stability/Clinical Decision Making Evolving/Moderate complexity    Rehab Potential Good    PT Frequency 2x / week   1-2   PT Duration 12 weeks    PT Treatment/Interventions ADLs/Self Care Home Management;Cryotherapy;Electrical Stimulation;Moist Heat;Traction;Gait Scientist, forensic;Therapeutic activities;Therapeutic exercise;Balance training;Neuromuscular re-education;Manual techniques;Passive range of motion;Dry needling;Joint Manipulations;Spinal Manipulations;Taping    PT Next Visit Plan balance, leg strength, endurance, pball core    PT Home Exercise Plan tandem balance, SLS, squats, sit to stands    Consulted and Agree with Plan of Care Patient           Patient will benefit from skilled therapeutic intervention in order to improve the following deficits and impairments:  Decreased activity tolerance,Decreased balance,Decreased endurance,Decreased mobility,Decreased range of motion,Decreased strength,Difficulty walking,Increased muscle spasms,Impaired flexibility,Postural dysfunction,Improper body mechanics,Pain  Visit Diagnosis: Unsteadiness on feet  Muscle weakness (generalized)  Hemiplegia and hemiparesis following nontraumatic intracerebral hemorrhage affecting left non-dominant side (HCC)  Other symptoms and signs involving the nervous system  Chronic bilateral low back pain without sciatica     Problem List Patient Active Problem List   Diagnosis Date Noted  . Intermediate stage nonexudative age-related macular degeneration of right eye 05/08/2020  . Exudative age-related macular degeneration of left eye with active choroidal neovascularization ([place]) 10/11/2019  . Intermediate stage nonexudative age-related macular degeneration of both eyes  10/11/2019  . Operculated retinal tear of left eye 10/11/2019  . Right posterior capsular opacification 10/11/2019  . Posterior vitreous detachment of left eye 10/11/2019  . Paving stone retinal degeneration of right eye 10/11/2019  . Calculus of kidney 03/12/2018  . Bleeding internal hemorrhoids 10/09/2017  . Sleep apnea 09/22/2017  . History of adenomatous polyp of colon 09/18/2017  . Prostate cancer ([place]) 09/18/2017  . Essential hypertension 08/26/2017  . Hyperlipidemia 08/26/2017  . Cerebral venous thrombosis 07/13/2017  . Intracerebral hemorrhage 07/10/2017  . ICH (intracerebral hemorrhage) ([place]) 07/10/2017  . Unstable angina [facility]) 03/12/2013    Silvestre Mesi 08/17/2020, 8:57 AM  [facility] Physical Therapy [address] [place], [place], [address] Phone: [phone]   Fax:  [phone]  Name: Justin Adams MRN: [id number] Date of Birth: 1946/03/18

## 2020-08-22 ENCOUNTER — Encounter: Payer: Self-pay | Admitting: Physical Therapy

## 2020-08-22 ENCOUNTER — Ambulatory Visit (INDEPENDENT_AMBULATORY_CARE_PROVIDER_SITE_OTHER): Payer: Medicare Other | Admitting: Physical Therapy

## 2020-08-22 ENCOUNTER — Other Ambulatory Visit: Payer: Self-pay

## 2020-08-22 DIAGNOSIS — I69154 Hemiplegia and hemiparesis following nontraumatic intracerebral hemorrhage affecting left non-dominant side: Secondary | ICD-10-CM | POA: Diagnosis not present

## 2020-08-22 DIAGNOSIS — M6281 Muscle weakness (generalized): Secondary | ICD-10-CM | POA: Diagnosis not present

## 2020-08-22 DIAGNOSIS — R29818 Other symptoms and signs involving the nervous system: Secondary | ICD-10-CM

## 2020-08-22 DIAGNOSIS — R2681 Unsteadiness on feet: Secondary | ICD-10-CM | POA: Diagnosis not present

## 2020-08-22 DIAGNOSIS — G8929 Other chronic pain: Secondary | ICD-10-CM | POA: Diagnosis not present

## 2020-08-22 DIAGNOSIS — M545 Low back pain, unspecified: Secondary | ICD-10-CM

## 2020-08-22 NOTE — Therapy (Signed)
[place], [place], [address] Phone: [phone]   Fax:  [phone]  Physical Therapy Treatment  Patient Details  Name: Justin Adams MRN: [id number] Date of Birth: Nov 06, 1945 Referring Provider (PT): Stoneking MD   Encounter Date: 08/22/2020   PT End of Session - 08/22/20 0814    Visit Number 29    Number of Visits 30    Date for PT Re-Evaluation 09/07/20    Authorization Type MCR    Progress Note Due on Visit 30    PT Start Time 0801    PT Stop Time 0845    PT Time Calculation (min) 44 min    Activity Tolerance Patient tolerated treatment well    Behavior During Therapy [facility] for tasks assessed/performed           Past Medical History:  Diagnosis Date  . Adenomatous polyp   . Bleeding internal hemorrhoids 10/2017  . Cerebral venous thrombosis   . Grade I diastolic dysfunction 64/40/3474   noted on ECHO   . Hemorrhoids   . History of kidney stones   . Hypercholesteremia   . Hypertension   . Intracranial hemorrhage (HCC)    Right temporal lobe  . LVH (left ventricular hypertrophy) 07/11/2017   Mild, noted on ECHO   . Muscular degeneration   . Nephrolithiasis   . Obesity   . OSA (obstructive sleep apnea)   . Polycythemia   . Prostate cancer ([place]) 2003  . Shingles   . Stroke ([place]) 06/2017   No residual  . Unsteady gait   . Ureteral calculi 02/12/2008   Left    Past Surgical History:  Procedure Laterality Date  . APPENDECTOMY     childhood  . CHOLECYSTECTOMY    . COLONOSCOPY    . HEMORRHOID BANDING  2015  . HOLMIUM LASER APPLICATION Left 25/02/5637   Procedure: HOLMIUM LASER APPLICATION;  Surgeon: Franchot Gallo, MD;  Location: [facility];  Service: Urology;  Laterality: Left;  . IR ANGIO EXTERNAL CAROTID SEL EXT CAROTID UNI R MOD SED  07/10/2017  . IR ANGIO INTRA EXTRACRAN SEL INTERNAL CAROTID BILAT MOD SED  07/10/2017  . IR ANGIO VERTEBRAL SEL VERTEBRAL BILAT MOD SED  07/10/2017  . IR  URETERAL STENT LEFT NEW ACCESS W/O SEP NEPHROSTOMY CATH  03/12/2018  . LEFT HEART CATHETERIZATION WITH CORONARY ANGIOGRAM N/A 03/15/2013   Procedure: LEFT HEART CATHETERIZATION WITH CORONARY ANGIOGRAM;  Surgeon: Minus Breeding, MD;  Location: [facility] CATH LAB;  Service: Cardiovascular;  Laterality: N/A;  . NEPHROLITHOTOMY Left 03/12/2018   Procedure: LEFT NEPHROLITHOTOMY PERCUTANEOUS, STENT PLACEMENT;  Surgeon: Franchot Gallo, MD;  Location: [facility];  Service: Urology;  Laterality: Left;  . PROSTATECTOMY    . STONE EXTRACTION WITH BASKET      There were no vitals filed for this visit.   Subjective Assessment - 08/22/20 0811    Subjective he denies back pain upon arrival and states now both knees are bothering him. He thinks it may be due to him going to church 3 days per week now where he is kneeling up/down from the floor a lot.    Limitations Lifting;House hold activities    Patient Stated Goals reduce pain, improve balance    Pain Onset More than a month ago              [facility] PT Assessment - 08/22/20 0001      Strength   Right Hip Flexion 4+/5    Right Hip ABduction 4+/5  Left Hip Flexion 4+/5    Left Hip ABduction 4+/5    Right Knee Flexion 5/5    Right Knee Extension 5/5    Left Knee Flexion 5/5    Left Knee Extension 5/5            [facility] Adult PT Treatment/Exercise - 08/22/20 0001      Neuro Re-ed    Neuro Re-ed Details  SLS, tandem walk fwd on foam, lateral walk on foam      Lumbar Exercises: Stretches   Double Knee to Chest Stretch Limitations 5 reps 10 seconds with feet on pball    Lower Trunk Rotation 5 reps;10 seconds    Other Lumbar Stretch Exercise seated pball roll outs 10 sec X 10 for lumbar flexion       Lumbar Exercises: Aerobic   Recumbent Bike L3 X 8 min      Lumbar Exercises: Standing   Lifting Limitations deadlift with blue band X 20 reps    Other Standing Lumbar Exercises suitcase carry 15 lbs one lap ea side    Other Standing Lumbar Exercises  TRX rows and squats 2X10 ea      Lumbar Exercises: Seated   Sit to Stand Limitations 3 sets of 5 from raised mat surface      Lumbar Exercises: Supine   Bridge Limitations 2 sets of 10 5 sec holds feet on pball    Straight Leg Raises Limitations 2 sets of 10            PT Short Term Goals - 06/01/20 0904      PT SHORT TERM GOAL #1   Title Pt will report performing walking program, HEP and [employer] 2-3 days/week    Baseline relays he averages 2 times per week on 12/23    Time 4    Period Weeks    Status Achieved    Target Date 04/20/20      PT SHORT TERM GOAL #2   Title Pt will improve functional LE strength decreasing five time sit to stand to </= 15 seconds    Baseline 14.99 on 12/23    Time 4    Period Weeks    Status Achieved             PT Long Term Goals - 07/18/20 7782      PT LONG TERM GOAL #1   Title Pt will improve 6 minute walk test by 160 ft(one extra lap) to show improved endurance and gait speed.    Baseline improved 168 ft so 1320 ft total on 12/23    Time 12    Period Weeks    Status Achieved      PT LONG TERM GOAL #2   Title Pt will increaese overall leg strength to at least 4+ to 5- overall to improve function    Baseline now 4+ to 5    Time 12    Period Weeks    Status Achieved      PT LONG TERM GOAL #3   Title Pt will improve 5 times sit to stand test to less than 13 seconds    Baseline 13.3 on 2/8    Time 12    Period Weeks    Status On-going      PT LONG TERM GOAL #4   Title Pt will be able SLS goal to 30 sec to show improved balance    Baseline 20 sec at best but 9 sec avg on Lt  and 15 sec avg on Rt on 2/8    Status On-going      PT LONG TERM GOAL #5   Title -                 Plan - 08/22/20 0837    Clinical Impression Statement Limited some by knee pain today but overall his back has been doing well and has less overall pain. He still has not had any falls since starting back PT and his balance has overall  improved as well. Continue POC    Personal Factors and Comorbidities Comorbidity 3+    Comorbidities CVA, sleep apnea, chronic LBP, history of falls    Examination-Activity Limitations Carry;Bend;Bed Mobility;Dressing;Lift;Stand;Stairs;Squat;Sleep;Locomotion Level    Examination-Participation Restrictions Cleaning;Community Activity;Driving;Laundry;Yard Work;Volunteer    Stability/Clinical Decision Making Evolving/Moderate complexity    Rehab Potential Good    PT Frequency 2x / week   1-2   PT Duration 12 weeks    PT Treatment/Interventions ADLs/Self Care Home Management;Cryotherapy;Electrical Stimulation;Moist Heat;Traction;Gait Scientist, forensic;Therapeutic activities;Therapeutic exercise;Balance training;Neuromuscular re-education;Manual techniques;Passive range of motion;Dry needling;Joint Manipulations;Spinal Manipulations;Taping    PT Next Visit Plan balance, leg strength, endurance, pball core    PT Home Exercise Plan tandem balance, SLS, squats, sit to stands    Consulted and Agree with Plan of Care Patient           Patient will benefit from skilled therapeutic intervention in order to improve the following deficits and impairments:  Decreased activity tolerance,Decreased balance,Decreased endurance,Decreased mobility,Decreased range of motion,Decreased strength,Difficulty walking,Increased muscle spasms,Impaired flexibility,Postural dysfunction,Improper body mechanics,Pain  Visit Diagnosis: Unsteadiness on feet  Muscle weakness (generalized)  Hemiplegia and hemiparesis following nontraumatic intracerebral hemorrhage affecting left non-dominant side (HCC)  Other symptoms and signs involving the nervous system  Chronic bilateral low back pain without sciatica     Problem List Patient Active Problem List   Diagnosis Date Noted  . Intermediate stage nonexudative age-related macular degeneration of right eye 05/08/2020  . Exudative age-related macular degeneration of  left eye with active choroidal neovascularization ([place]) 10/11/2019  . Intermediate stage nonexudative age-related macular degeneration of both eyes 10/11/2019  . Operculated retinal tear of left eye 10/11/2019  . Right posterior capsular opacification 10/11/2019  . Posterior vitreous detachment of left eye 10/11/2019  . Paving stone retinal degeneration of right eye 10/11/2019  . Calculus of kidney 03/12/2018  . Bleeding internal hemorrhoids 10/09/2017  . Sleep apnea 09/22/2017  . History of adenomatous polyp of colon 09/18/2017  . Prostate cancer ([place]) 09/18/2017  . Essential hypertension 08/26/2017  . Hyperlipidemia 08/26/2017  . Cerebral venous thrombosis 07/13/2017  . Intracerebral hemorrhage 07/10/2017  . ICH (intracerebral hemorrhage) ([place]) 07/10/2017  . Unstable angina ([place]) 03/12/2013    Debbe Odea, PT,DPT 08/22/2020, 8:42 AM  [facility] Physical Therapy [address] [place], [place], [address] Phone: [phone]   Fax:  [phone]  Name: KAVEON BLATZ MRN: [id number] Date of Birth: May 29, 1946

## 2020-08-23 DIAGNOSIS — Z961 Presence of intraocular lens: Secondary | ICD-10-CM | POA: Diagnosis not present

## 2020-08-23 DIAGNOSIS — H353221 Exudative age-related macular degeneration, left eye, with active choroidal neovascularization: Secondary | ICD-10-CM | POA: Diagnosis not present

## 2020-08-23 DIAGNOSIS — H353112 Nonexudative age-related macular degeneration, right eye, intermediate dry stage: Secondary | ICD-10-CM | POA: Diagnosis not present

## 2020-08-23 DIAGNOSIS — H524 Presbyopia: Secondary | ICD-10-CM | POA: Diagnosis not present

## 2020-08-30 ENCOUNTER — Encounter (INDEPENDENT_AMBULATORY_CARE_PROVIDER_SITE_OTHER): Payer: Medicare Other | Admitting: Ophthalmology

## 2020-09-04 ENCOUNTER — Other Ambulatory Visit: Payer: Self-pay

## 2020-09-04 ENCOUNTER — Ambulatory Visit (INDEPENDENT_AMBULATORY_CARE_PROVIDER_SITE_OTHER): Payer: Medicare Other | Admitting: Physical Therapy

## 2020-09-04 DIAGNOSIS — R2681 Unsteadiness on feet: Secondary | ICD-10-CM | POA: Diagnosis not present

## 2020-09-04 DIAGNOSIS — I69154 Hemiplegia and hemiparesis following nontraumatic intracerebral hemorrhage affecting left non-dominant side: Secondary | ICD-10-CM

## 2020-09-04 DIAGNOSIS — M6281 Muscle weakness (generalized): Secondary | ICD-10-CM | POA: Diagnosis not present

## 2020-09-04 DIAGNOSIS — G8929 Other chronic pain: Secondary | ICD-10-CM | POA: Diagnosis not present

## 2020-09-04 DIAGNOSIS — R29818 Other symptoms and signs involving the nervous system: Secondary | ICD-10-CM

## 2020-09-04 DIAGNOSIS — M545 Low back pain, unspecified: Secondary | ICD-10-CM

## 2020-09-04 NOTE — Therapy (Signed)
[facility] Physical Therapy [address] [place], [place], [address] Phone: [phone]   Fax:  [phone]  Physical Therapy Treatment/Progress note Progress Note reporting period 07/18/20 to 09/04/20  See below for objective and subjective measurements relating to patients progress with PT.   Patient Details  Name: Justin Adams MRN: [id number] Date of Birth: 06-07-1946 Referring Provider (PT): Stoneking MD   Encounter Date: 09/04/2020   PT End of Session - 09/04/20 0842    Visit Number 30    Number of Visits 30    Date for PT Re-Evaluation 09/07/20    Authorization Type MCR    Progress Note Due on Visit 30    PT Start Time 0800    PT Stop Time 0842    PT Time Calculation (min) 42 min    Activity Tolerance Patient tolerated treatment well    Behavior During Therapy [facility] for tasks assessed/performed           Past Medical History:  Diagnosis Date  . Adenomatous polyp   . Bleeding internal hemorrhoids 10/2017  . Cerebral venous thrombosis   . Grade I diastolic dysfunction 97/28/2060   noted on ECHO   . Hemorrhoids   . History of kidney stones   . Hypercholesteremia   . Hypertension   . Intracranial hemorrhage (HCC)    Right temporal lobe  . LVH (left ventricular hypertrophy) 07/11/2017   Mild, noted on ECHO   . Muscular degeneration   . Nephrolithiasis   . Obesity   . OSA (obstructive sleep apnea)   . Polycythemia   . Prostate cancer ([place]) 2003  . Shingles   . Stroke ([place]) 06/2017   No residual  . Unsteady gait   . Ureteral calculi 02/12/2008   Left    Past Surgical History:  Procedure Laterality Date  . APPENDECTOMY     childhood  . CHOLECYSTECTOMY    . COLONOSCOPY    . HEMORRHOID BANDING  2015  . HOLMIUM LASER APPLICATION Left 15/11/1535   Procedure: HOLMIUM LASER APPLICATION;  Surgeon: Franchot Gallo, MD;  Location: [facility];  Service: Urology;  Laterality: Left;  . IR ANGIO EXTERNAL CAROTID SEL EXT CAROTID UNI R MOD  SED  07/10/2017  . IR ANGIO INTRA EXTRACRAN SEL INTERNAL CAROTID BILAT MOD SED  07/10/2017  . IR ANGIO VERTEBRAL SEL VERTEBRAL BILAT MOD SED  07/10/2017  . IR URETERAL STENT LEFT NEW ACCESS W/O SEP NEPHROSTOMY CATH  03/12/2018  . LEFT HEART CATHETERIZATION WITH CORONARY ANGIOGRAM N/A 03/15/2013   Procedure: LEFT HEART CATHETERIZATION WITH CORONARY ANGIOGRAM;  Surgeon: Minus Breeding, MD;  Location[facility] CATH LAB;  Service: Cardiovascular;  Laterality: N/A;  . NEPHROLITHOTOMY Left 03/12/2018   Procedure: LEFT NEPHROLITHOTOMY PERCUTANEOUS, STENT PLACEMENT;  Surgeon: Franchot Gallo, MD;  Location: [facility];  Service: Urology;  Laterality: Left;  . PROSTATECTOMY    . STONE EXTRACTION WITH BASKET      There were no vitals filed for this visit.   Subjective Assessment - 09/04/20 0829    Subjective he denies back pain upon arrival, just has some mild pain and stiffness first thing in the morning. His knee pain is his biggest complaint.    Limitations Lifting;House hold activities    Patient Stated Goals reduce pain, improve balance    Pain Onset More than a month ago              [facility] PT Assessment - 09/04/20 0001      Assessment   Medical Diagnosis LBP,  balance, general weakness    Referring Provider (PT) Stoneking MD      Observation/Other Assessments   Focus on Therapeutic Outcomes (FOTO)  75% functional score      Single Leg Stance   Comments 15 sec avg      AROM   Overall AROM Comments WNL except extension limited by 75%                         [facility] Adult PT Treatment/Exercise - 09/04/20 0001      Neuro Re-ed    Neuro Re-ed Details  SLS, tandem walk fwd and retro      Lumbar Exercises: Stretches   Double Knee to Chest Stretch Limitations 5 reps 10 seconds with feet on pball    Lower Trunk Rotation 5 reps;10 seconds    Other Lumbar Stretch Exercise seated pball roll outs 10 sec X 10 for lumbar flexion       Lumbar Exercises: Aerobic   Recumbent Bike L3 X 4  min (stopped early due to knee pain)      Lumbar Exercises: Machines for Strengthening   Other Lumbar Machine Exercise row machine 35 lbs 3X10 reps, lat pull 25 lbs 2X15    Other Lumbar Machine Exercise bilat cable chest press 5 lbs ea 3X10, bilat cable shoulder extensions 5 lbs ea 3x10      Lumbar Exercises: Standing   Other Standing Lumbar Exercises suitcase carry 15 lbs one lap ea side      Lumbar Exercises: Supine   Bridge Limitations 2 sets of 10 5 sec holds feet on pball    Straight Leg Raises Limitations 2 sets of 10                    PT Short Term Goals - 06/01/20 0904      PT SHORT TERM GOAL #1   Title Pt will report performing walking program, HEP and [employer] 2-3 days/week    Baseline relays he averages 2 times per week on 12/23    Time 4    Period Weeks    Status Achieved    Target Date 04/20/20      PT SHORT TERM GOAL #2   Title Pt will improve functional LE strength decreasing five time sit to stand to </= 15 seconds    Baseline 14.99 on 12/23    Time 4    Period Weeks    Status Achieved             PT Long Term Goals - 07/18/20 6301      PT LONG TERM GOAL #1   Title Pt will improve 6 minute walk test by 160 ft(one extra lap) to show improved endurance and gait speed.    Baseline improved 168 ft so 1320 ft total on 12/23    Time 12    Period Weeks    Status Achieved      PT LONG TERM GOAL #2   Title Pt will increaese overall leg strength to at least 4+ to 5- overall to improve function    Baseline now 4+ to 5    Time 12    Period Weeks    Status Achieved      PT LONG TERM GOAL #3   Title Pt will improve 5 times sit to stand test to less than 13 seconds    Baseline 13.3 on 2/8    Time 12  Period Weeks    Status On-going      PT LONG TERM GOAL #4   Title Pt will be able SLS goal to 30 sec to show improved balance    Baseline 20 sec at best but 9 sec avg on Lt and 15 sec avg on Rt on 2/8    Status On-going      PT LONG  TERM GOAL #5   Title -                 Plan - 09/04/20 0844    Clinical Impression Statement FOTO functional outcome score was performed today and although his functional score did not change (was predicted to drop one point due to co morbidities), it still met the predicted value and we have been able to maintain his function and decrease his risk of falls. He has one more visit scheduled and we will reassess final measurements and goals then and discharge to final HEP.    Personal Factors and Comorbidities Comorbidity 3+;Age    Comorbidities CVA, sleep apnea, chronic LBP, history of falls    Examination-Activity Limitations Carry;Bend;Bed Mobility;Dressing;Lift;Stand;Stairs;Squat;Sleep;Locomotion Level    Examination-Participation Restrictions Cleaning;Community Activity;Driving;Laundry;Yard Work;Volunteer    Stability/Clinical Decision Making Evolving/Moderate complexity    Rehab Potential Good    PT Frequency 2x / week   1-2   PT Duration 12 weeks    PT Treatment/Interventions ADLs/Self Care Home Management;Cryotherapy;Electrical Stimulation;Moist Heat;Traction;Gait Scientist, forensic;Therapeutic activities;Therapeutic exercise;Balance training;Neuromuscular re-education;Manual techniques;Passive range of motion;Dry needling;Joint Manipulations;Spinal Manipulations;Taping    PT Next Visit Plan DC    PT Home Exercise Plan tandem balance, SLS, squats, sit to stands    Consulted and Agree with Plan of Care Patient           Patient will benefit from skilled therapeutic intervention in order to improve the following deficits and impairments:  Decreased activity tolerance,Decreased balance,Decreased endurance,Decreased mobility,Decreased range of motion,Decreased strength,Difficulty walking,Increased muscle spasms,Impaired flexibility,Postural dysfunction,Improper body mechanics,Pain  Visit Diagnosis: Unsteadiness on feet  Muscle weakness (generalized)  Hemiplegia and  hemiparesis following nontraumatic intracerebral hemorrhage affecting left non-dominant side (HCC)  Other symptoms and signs involving the nervous system  Chronic bilateral low back pain without sciatica     Problem List Patient Active Problem List   Diagnosis Date Noted  . Intermediate stage nonexudative age-related macular degeneration of right eye 05/08/2020  . Exudative age-related macular degeneration of left eye with active choroidal neovascularization ([place]) 10/11/2019  . Intermediate stage nonexudative age-related macular degeneration of both eyes 10/11/2019  . Operculated retinal tear of left eye 10/11/2019  . Right posterior capsular opacification 10/11/2019  . Posterior vitreous detachment of left eye 10/11/2019  . Paving stone retinal degeneration of right eye 10/11/2019  . Calculus of kidney 03/12/2018  . Bleeding internal hemorrhoids 10/09/2017  . Sleep apnea 09/22/2017  . History of adenomatous polyp of colon 09/18/2017  . Prostate cancer ([place]) 09/18/2017  . Essential hypertension 08/26/2017  . Hyperlipidemia 08/26/2017  . Cerebral venous thrombosis 07/13/2017  . Intracerebral hemorrhage 07/10/2017  . ICH (intracerebral hemorrhage) ([place]) 07/10/2017  . Unstable angina ([place]) 03/12/2013    Debbe Odea ,PT,DPT 09/04/2020, 8:57 AM  [facility] Physical Therapy [address] [place], [place], [address] Phone: [phone]   Fax:  [phone]  Name: Justin Adams MRN: [id number] Date of Birth: 02/03/1946

## 2020-09-05 ENCOUNTER — Ambulatory Visit (INDEPENDENT_AMBULATORY_CARE_PROVIDER_SITE_OTHER): Payer: Medicare Other | Admitting: Ophthalmology

## 2020-09-05 ENCOUNTER — Encounter (INDEPENDENT_AMBULATORY_CARE_PROVIDER_SITE_OTHER): Payer: Self-pay | Admitting: Ophthalmology

## 2020-09-05 DIAGNOSIS — H353221 Exudative age-related macular degeneration, left eye, with active choroidal neovascularization: Secondary | ICD-10-CM | POA: Diagnosis not present

## 2020-09-05 MED ORDER — BEVACIZUMAB 2.5 MG/0.1ML IZ SOSY
2.5000 mg | PREFILLED_SYRINGE | INTRAVITREAL | Status: AC | PRN
  Administered 2020-09-05: 2.5 mg via INTRAVITREAL

## 2020-09-05 NOTE — Progress Notes (Signed)
09/05/2020     CHIEF COMPLAINT Patient presents for Retina Follow Up (8 Wk F/U OS, poss Avastin OS//Pt denies noticeable changes to [place] OU since last visit. Pt denies ocular pain, flashes of light, or floaters OU. //)   HISTORY OF PRESENT ILLNESS: Justin Adams is a 75 y.o. male who presents to the clinic today for:   HPI    Retina Follow Up    Patient presents with  Wet AMD.  In left eye.  This started 8 weeks ago.  Severity is mild.  Duration of 8 weeks.  Since onset it is stable. Additional comments: 8 Wk F/U OS, poss Avastin OS  Pt denies noticeable changes to [place] OU since last visit. Pt denies ocular pain, flashes of light, or floaters OU.          Last edited by Rockie Neighbours, [place] on 09/05/2020  8:32 AM. (History)      Referring physician: [name], MD [address] [place],   [address]  HISTORICAL INFORMATION:   Selected notes from the MEDICAL RECORD NUMBER    Lab Results  Component Value Date   HGBA1C 5.3 07/11/2017     CURRENT MEDICATIONS: No current outpatient medications on file. (Ophthalmic Drugs)   No current facility-administered medications for this visit. (Ophthalmic Drugs)   Current Outpatient Medications (Other)  Medication Sig  . acetaminophen (TYLENOL) 500 MG tablet Take 500-1,000 mg by mouth daily as needed for moderate pain or headache.  Marland Kitchen amLODipine (NORVASC) 2.5 MG tablet Take 1 tablet (2.5 mg total) by mouth daily as needed (if systolic bp is over 737).  Marland Kitchen apixaban (ELIQUIS) 5 MG TABS tablet Take 1 tablet (5 mg total) by mouth 2 (two) times daily.  Marland Kitchen atorvastatin (LIPITOR) 20 MG tablet Take 1 tablet (20 mg total) by mouth daily.  Marland Kitchen lisinopril (PRINIVIL,ZESTRIL) 20 MG tablet Take 1 tablet (20 mg total) by mouth daily.  . Melatonin 3 MG TABS Take 3 mg by mouth at bedtime as needed (sleep).  . polyethylene glycol (MIRALAX / GLYCOLAX) packet Take 17 g by mouth at bedtime.  . Wheat Dextrin (BENEFIBER) POWD Take 1  Dose by mouth daily.   No current facility-administered medications for this visit. (Other)      REVIEW OF SYSTEMS:    ALLERGIES No Known Allergies  PAST MEDICAL HISTORY Past Medical History:  Diagnosis Date  . Adenomatous polyp   . Bleeding internal hemorrhoids 10/2017  . Cerebral venous thrombosis   . Grade I diastolic dysfunction 10/62/6948   noted on ECHO   . Hemorrhoids   . History of kidney stones   . Hypercholesteremia   . Hypertension   . Intracranial hemorrhage (HCC)    Right temporal lobe  . LVH (left ventricular hypertrophy) 07/11/2017   Mild, noted on ECHO   . Muscular degeneration   . Nephrolithiasis   . Obesity   . OSA (obstructive sleep apnea)   . Polycythemia   . Prostate cancer ([place]) 2003  . Shingles   . Stroke ([place]) 06/2017   No residual  . Unsteady gait   . Ureteral calculi 02/12/2008   Left   Past Surgical History:  Procedure Laterality Date  . APPENDECTOMY     childhood  . CHOLECYSTECTOMY    . COLONOSCOPY    . HEMORRHOID BANDING  2015  . HOLMIUM LASER APPLICATION Left 54/11/2701   Procedure: HOLMIUM LASER APPLICATION;  Surgeon: Franchot Gallo, MD;  Location: [facility];  Service: Urology;  Laterality: Left;  . IR ANGIO EXTERNAL CAROTID SEL EXT CAROTID UNI R MOD SED  07/10/2017  . IR ANGIO INTRA EXTRACRAN SEL INTERNAL CAROTID BILAT MOD SED  07/10/2017  . IR ANGIO VERTEBRAL SEL VERTEBRAL BILAT MOD SED  07/10/2017  . IR URETERAL STENT LEFT NEW ACCESS W/O SEP NEPHROSTOMY CATH  03/12/2018  . LEFT HEART CATHETERIZATION WITH CORONARY ANGIOGRAM N/A 03/15/2013   Procedure: LEFT HEART CATHETERIZATION WITH CORONARY ANGIOGRAM;  Surgeon: Minus Breeding, MD;  Location: [facility] CATH LAB;  Service: Cardiovascular;  Laterality: N/A;  . NEPHROLITHOTOMY Left 03/12/2018   Procedure: LEFT NEPHROLITHOTOMY PERCUTANEOUS, STENT PLACEMENT;  Surgeon: Franchot Gallo, MD;  Location: [facility];  Service: Urology;  Laterality: Left;  . PROSTATECTOMY    . STONE EXTRACTION WITH  BASKET      FAMILY HISTORY Family History  Problem Relation Age of Onset  . Hypertension Mother   . Coronary artery disease Mother 48  . Breast cancer Mother   . Coronary artery disease Father 69       Died age 26  . CAD Brother 46    SOCIAL HISTORY Social History   Tobacco Use  . Smoking status: Never Smoker  . Smokeless tobacco: Never Used  Vaping Use  . Vaping Use: Never used  Substance Use Topics  . Alcohol use: No  . Drug use: No         OPHTHALMIC EXAM: Base Eye Exam    Visual Acuity (ETDRS)      Right Left   Dist cc 20/25 +1 E card @ 3'   Dist ph cc  NI   Correction: Glasses       Tonometry (Tonopen, 8:33 AM)      Right Left   Pressure 16 17       Pupils      Dark Light Shape React APD   Right 4.5 3.5 Round Brisk None   Left 4.5 3.5 Round Brisk None       Visual Fields (Counting fingers)      Left Right    Full Full       Extraocular Movement      Right Left    Full Full       Neuro/Psych    Oriented x3: Yes   Mood/Affect: Normal       Dilation    Left eye: 1.0% Mydriacyl, 2.5% Phenylephrine @ 8:35 AM        Slit Lamp and Fundus Exam    External Exam      Right Left   External Normal Normal       Slit Lamp Exam      Right Left   Lids/Lashes Normal Normal   Conjunctiva/Sclera White and quiet White and quiet   Cornea Clear Clear   Anterior Chamber Deep and quiet Deep and quiet   Iris Round and reactive Round and reactive   Lens Posterior chamber intraocular lens Posterior chamber intraocular lens   Anterior Vitreous Normal Normal       Fundus Exam      Right Left   Posterior Vitreous  Posterior vitreous detachment, Central vitreous floaters   Disc  Normal   C/D Ratio  0.45   Macula  Macular thickening, Subretinal neovascular membrane nasal, Retinal pigment epithelial mottling, Subretinal fibrosis, Hard drusen, Disciform scar, centrally with subretinal hemorrhage peripheral scarring    Vessels  Normal   Periphery   Normal, attached, 25 d, 90 d  IMAGING AND PROCEDURES  Imaging and Procedures for 09/05/20  OCT, Retina - OU - Both Eyes       Right Eye Quality was good. Scan locations included subfoveal. Central Foveal Thickness: 291. Progression has been stable. Findings include retinal drusen , abnormal foveal contour.   Left Eye Quality was good. Scan locations included subfoveal. Central Foveal Thickness: 255. Progression has improved. Findings include subretinal scarring, disciform scar, choroidal neovascular membrane, abnormal foveal contour, retinal drusen .   Notes Much less active disease left eye with disciform scar subfoveal left eye we will continue with intravitreal Avastin currently at 8-week follow-up today OS                ASSESSMENT/PLAN:  Exudative age-related macular degeneration of left eye with active choroidal neovascularization (HCC) The nature of wet macular degeneration was discussed with the patient.  Forms of therapy reviewed include the use of Anti-VEGF medications injected painlessly into the eye, as well as other possible treatment modalities, including thermal laser therapy. Fellow eye involvement and risks were discussed with the patient. Upon the finding of wet age related macular degeneration, treatment will be offered. The treatment regimen is on a treat as needed basis with the intent to treat if necessary and extend interval of exams when possible. On average 1 out of 6 patients do not need lifetime therapy. However, the risk of recurrent disease is high for a lifetime.  Initially monthly, then periodic, examinations and evaluations will determine whether the next treatment is required on the day of the examination.  Disciform scar with subretinal hemorrhage seen clinically.  Currently at 8-week follow-up.  We will repeat injection today      ICD-10-CM   1. Exudative age-related macular degeneration of left eye with active choroidal  neovascularization (HCC)  H35.3221 OCT, Retina - OU - Both Eyes    1.  OS, stable acuity, stable lesion size, yet still clinically with SR hemorrhage thus will need to continue to maintain 8-week interval to decrease treatment burden but also prevents growth of scotoma  2.  3.  Ophthalmic Meds Ordered this visit:  No orders of the defined types were placed in this encounter.      Return in about 8 weeks (around 10/31/2020) for dilate, OS, AVASTIN OCT.  There are no Patient Instructions on file for this visit.   Explained the diagnoses, plan, and follow up with the patient and they expressed understanding.  Patient expressed understanding of the importance of proper follow up care.   Justin Adams M.D. Diseases & Surgery of the Retina and Vitreous [facility] [place] 09/05/20     Abbreviations: M myopia (nearsighted); A astigmatism; H hyperopia (farsighted); P presbyopia; Mrx spectacle prescription;  CTL contact lenses; OD right eye; OS left eye; OU both eyes  XT exotropia; ET esotropia; PEK punctate epithelial keratitis; PEE punctate epithelial erosions; DES dry eye syndrome; MGD meibomian gland dysfunction; ATs artificial tears; PFAT's preservative free artificial tears; [place] nuclear sclerotic cataract; PSC posterior subcapsular cataract; ERM epi-retinal membrane; PVD posterior vitreous detachment; RD retinal detachment; DM diabetes mellitus; DR diabetic retinopathy; NPDR non-proliferative diabetic retinopathy; PDR proliferative diabetic retinopathy; CSME clinically significant macular edema; DME diabetic macular edema; dbh dot blot hemorrhages; CWS cotton wool spot; POAG primary open angle glaucoma; C/D cup-to-disc ratio; HVF humphrey visual field; GVF goldmann visual field; OCT optical coherence tomography; IOP intraocular pressure; BRVO Branch retinal vein occlusion; CRVO central retinal vein occlusion; CRAO central retinal artery occlusion; BRAO branch  retinal artery  occlusion; RT retinal tear; SB scleral buckle; PPV pars plana vitrectomy; VH Vitreous hemorrhage; PRP panretinal laser photocoagulation; IVK intravitreal kenalog; VMT vitreomacular traction; MH Macular hole;  NVD neovascularization of the disc; NVE neovascularization elsewhere; AREDS age related eye disease study; ARMD age related macular degeneration; POAG primary open angle glaucoma; EBMD epithelial/anterior basement membrane dystrophy; ACIOL anterior chamber intraocular lens; IOL intraocular lens; PCIOL posterior chamber intraocular lens; Phaco/IOL phacoemulsification with intraocular lens placement; [place] photorefractive keratectomy; LASIK laser assisted in situ keratomileusis; HTN hypertension; DM diabetes mellitus; COPD chronic obstructive pulmonary disease

## 2020-09-05 NOTE — Assessment & Plan Note (Signed)
The nature of wet macular degeneration was discussed with the patient.  Forms of therapy reviewed include the use of Anti-VEGF medications injected painlessly into the eye, as well as other possible treatment modalities, including thermal laser therapy. Fellow eye involvement and risks were discussed with the patient. Upon the finding of wet age related macular degeneration, treatment will be offered. The treatment regimen is on a treat as needed basis with the intent to treat if necessary and extend interval of exams when possible. On average 1 out of 6 patients do not need lifetime therapy. However, the risk of recurrent disease is high for a lifetime.  Initially monthly, then periodic, examinations and evaluations will determine whether the next treatment is required on the day of the examination.  Disciform scar with subretinal hemorrhage seen clinically.  Currently at 8-week follow-up.  We will repeat injection today

## 2020-09-07 ENCOUNTER — Other Ambulatory Visit: Payer: Self-pay

## 2020-09-07 ENCOUNTER — Ambulatory Visit (INDEPENDENT_AMBULATORY_CARE_PROVIDER_SITE_OTHER): Payer: Medicare Other | Admitting: Physical Therapy

## 2020-09-07 DIAGNOSIS — R29818 Other symptoms and signs involving the nervous system: Secondary | ICD-10-CM

## 2020-09-07 DIAGNOSIS — M6281 Muscle weakness (generalized): Secondary | ICD-10-CM

## 2020-09-07 DIAGNOSIS — G8929 Other chronic pain: Secondary | ICD-10-CM

## 2020-09-07 DIAGNOSIS — R2681 Unsteadiness on feet: Secondary | ICD-10-CM

## 2020-09-07 DIAGNOSIS — I69154 Hemiplegia and hemiparesis following nontraumatic intracerebral hemorrhage affecting left non-dominant side: Secondary | ICD-10-CM | POA: Diagnosis not present

## 2020-09-07 DIAGNOSIS — M545 Low back pain, unspecified: Secondary | ICD-10-CM | POA: Diagnosis not present

## 2020-09-07 NOTE — Therapy (Addendum)
[facility] [address] [place], [place], [address] Phone: [phone]   Fax:  [phone]  Physical Therapy Treatment/Discharge PHYSICAL THERAPY DISCHARGE SUMMARY  Visits from Start of Care: 31  Current functional level related to goals / functional outcomes: See below   Remaining deficits: See below   Education / Equipment: HEP Plan: Patient agrees to discharge.  Patient goals were met. Patient is being discharged due to meeting the stated rehab goals.  ?????       Patient Details  Name: Justin Adams MRN: [id number] Date of Birth: 04/03/1946 Referring Provider (PT): Stoneking MD   Encounter Date: 09/07/2020   PT End of Session - 09/07/20 0944    Visit Number 31    Number of Visits 31    Date for PT Re-Evaluation 09/07/20    Authorization Type MCR    Progress Note Due on Visit 30    PT Start Time 0800    PT Stop Time 0844    PT Time Calculation (min) 44 min    Activity Tolerance Patient tolerated treatment well    Behavior During Therapy WFL for tasks assessed/performed           Past Medical History:  Diagnosis Date  . Adenomatous polyp   . Bleeding internal hemorrhoids 10/2017  . Cerebral venous thrombosis   . Grade I diastolic dysfunction 07/11/2017   noted on ECHO   . Hemorrhoids   . History of kidney stones   . Hypercholesteremia   . Hypertension   . Intracranial hemorrhage (HCC)    Right temporal lobe  . LVH (left ventricular hypertrophy) 07/11/2017   Mild, noted on ECHO   . Muscular degeneration   . Nephrolithiasis   . Obesity   . OSA (obstructive sleep apnea)   . Polycythemia   . Prostate cancer (HCC) 2003  . Shingles   . Stroke (HCC) 06/2017   No residual  . Unsteady gait   . Ureteral calculi 02/12/2008   Left    Past Surgical History:  Procedure Laterality Date  . APPENDECTOMY     childhood  . CHOLECYSTECTOMY    . COLONOSCOPY    . HEMORRHOID BANDING  2015  . HOLMIUM LASER  APPLICATION Left 03/12/2018   Procedure: HOLMIUM LASER APPLICATION;  Surgeon: Dahlstedt, Stephen, MD;  Location: [facility];  Service: Urology;  Laterality: Left;  . IR ANGIO EXTERNAL CAROTID SEL EXT CAROTID UNI R MOD SED  07/10/2017  . IR ANGIO INTRA EXTRACRAN SEL INTERNAL CAROTID BILAT MOD SED  07/10/2017  . IR ANGIO VERTEBRAL SEL VERTEBRAL BILAT MOD SED  07/10/2017  . IR URETERAL STENT LEFT NEW ACCESS W/O SEP NEPHROSTOMY CATH  03/12/2018  . LEFT HEART CATHETERIZATION WITH CORONARY ANGIOGRAM N/A 03/15/2013   Procedure: LEFT HEART CATHETERIZATION WITH CORONARY ANGIOGRAM;  Surgeon: James Hochrein, MD;  Location: [facility];  Service: Cardiovascular;  Laterality: N/A;  . NEPHROLITHOTOMY Left 03/12/2018   Procedure: LEFT NEPHROLITHOTOMY PERCUTANEOUS, STENT PLACEMENT;  Surgeon: Dahlstedt, Stephen, MD;  Location: [facility];  Service: Urology;  Laterality: Left;  . PROSTATECTOMY    . STONE EXTRACTION WITH BASKET      There were no vitals filed for this visit.   Subjective Assessment - 09/07/20 0930    Subjective He feels good with plan to discharge, no questions about HEP.    Limitations Lifting;House hold activities    Patient Stated Goals reduce pain, improve balance    Pain Onset More than a month ago                [facility] PT Assessment - 09/07/20 0001      Assessment   Medical Diagnosis LBP, balance, general weakness    Referring Provider (PT) Stoneking MD      Observation/Other Assessments   Focus on Therapeutic Outcomes (FOTO)  75% functional score      Functional Tests   Functional tests Other      Single Leg Stance   Comments 30 sec best      Other:   Other/ Comments 5TSTS test 11.05 sec with UE support      AROM   Overall AROM Comments WNL except extension limited by 75%      Strength   Overall Strength Comments knee strength overall 5/5, hip strength grossly 4+      6 minute walk test results    Aerobic Endurance Distance Walked 1466                          [facility] Adult PT Treatment/Exercise - 09/07/20 0001      Lumbar Exercises: Stretches   Double Knee to Chest Stretch Limitations 5 reps 10 seconds with feet on pball    Lower Trunk Rotation 5 reps;10 seconds    Other Lumbar Stretch Exercise seated pball roll outs 10 sec X 10 for lumbar flexion                     PT Short Term Goals - 09/07/20 0826      PT SHORT TERM GOAL #1   Title Pt will report performing walking program, HEP and [employer] 2-3 days/week    Baseline relays he averages 2 times per week on 12/23    Time 4    Period Weeks    Status Achieved    Target Date 04/20/20      PT SHORT TERM GOAL #2   Title Pt will improve functional LE strength decreasing five time sit to stand to </= 15 seconds    Baseline 14.99 on 12/23    Time 4    Period Weeks    Status Achieved             PT Long Term Goals - 09/07/20 8182      PT LONG TERM GOAL #1   Title Pt will improve 6 minute walk test by 160 ft(one extra lap) to show improved endurance and gait speed.    Baseline improved >300 feet to 1460 on 09/07/20    Time 12    Period Weeks    Status Achieved      PT LONG TERM GOAL #2   Title Pt will increaese overall leg strength to at least 4+ to 5- overall to improve function    Baseline now 4+ to 5    Time 12    Period Weeks    Status Achieved      PT LONG TERM GOAL #3   Title Pt will improve 5 times sit to stand test to less than 13 seconds    Baseline 11.05 on 3/31    Time 12    Period Weeks    Status Achieved      PT LONG TERM GOAL #4   Title Pt will be able SLS goal to 30 sec to show improved balance    Baseline achieved but does not average this score    Status Achieved      PT LONG TERM GOAL #5   Title -  Plan - 09/07/20 0945    Clinical Impression Statement Session focused on updating measurments and goals and functional testing. He has now met all PT goals and made great progress with PT. He has not had any more falls  since starting PT again and was able so show improved ambulaiton endurance in 6 minute walk test.  He will be discharged today and he had no further questions or concerns.    Personal Factors and Comorbidities Comorbidity 3+;Age    Comorbidities CVA, sleep apnea, chronic LBP, history of falls    Examination-Activity Limitations Carry;Bend;Bed Mobility;Dressing;Lift;Stand;Stairs;Squat;Sleep;Locomotion Level    Examination-Participation Restrictions Cleaning;Community Activity;Driving;Laundry;Yard Work;Volunteer    Stability/Clinical Decision Making Evolving/Moderate complexity    Rehab Potential Good    PT Frequency 2x / week   1-2   PT Duration 12 weeks    PT Treatment/Interventions ADLs/Self Care Home Management;Cryotherapy;Electrical Stimulation;Moist Heat;Traction;Gait Scientist, forensic;Therapeutic activities;Therapeutic exercise;Balance training;Neuromuscular re-education;Manual techniques;Passive range of motion;Dry needling;Joint Manipulations;Spinal Manipulations;Taping    PT Next Visit Plan DC    PT Home Exercise Plan tandem balance, SLS, squats, sit to stands    Consulted and Agree with Plan of Care Patient           Patient will benefit from skilled therapeutic intervention in order to improve the following deficits and impairments:  Decreased activity tolerance,Decreased balance,Decreased endurance,Decreased mobility,Decreased range of motion,Decreased strength,Difficulty walking,Increased muscle spasms,Impaired flexibility,Postural dysfunction,Improper body mechanics,Pain  Visit Diagnosis: Unsteadiness on feet  Muscle weakness (generalized)  Hemiplegia and hemiparesis following nontraumatic intracerebral hemorrhage affecting left non-dominant side (HCC)  Other symptoms and signs involving the nervous system  Chronic bilateral low back pain without sciatica     Problem List Patient Active Problem List   Diagnosis Date Noted  . Intermediate stage nonexudative  age-related macular degeneration of right eye 05/08/2020  . Exudative age-related macular degeneration of left eye with active choroidal neovascularization ([place]) 10/11/2019  . Intermediate stage nonexudative age-related macular degeneration of both eyes 10/11/2019  . Operculated retinal tear of left eye 10/11/2019  . Right posterior capsular opacification 10/11/2019  . Posterior vitreous detachment of left eye 10/11/2019  . Paving stone retinal degeneration of right eye 10/11/2019  . Calculus of kidney 03/12/2018  . Bleeding internal hemorrhoids 10/09/2017  . Sleep apnea 09/22/2017  . History of adenomatous polyp of colon 09/18/2017  . Prostate cancer ([place]) 09/18/2017  . Essential hypertension 08/26/2017  . Hyperlipidemia 08/26/2017  . Cerebral venous thrombosis 07/13/2017  . Intracerebral hemorrhage 07/10/2017  . ICH (intracerebral hemorrhage) ([place]) 07/10/2017  . Unstable angina [facility]) 03/12/2013    Silvestre Mesi 09/07/2020, 9:52 AM  [facility] [address] [place], [place], [address] Phone: [phone]   Fax:  [phone]  Name: Justin Adams MRN: [id number] Date of Birth: 12-12-1945

## 2020-09-18 DIAGNOSIS — Z23 Encounter for immunization: Secondary | ICD-10-CM | POA: Diagnosis not present

## 2020-09-20 ENCOUNTER — Other Ambulatory Visit: Payer: Self-pay

## 2020-09-20 ENCOUNTER — Ambulatory Visit (INDEPENDENT_AMBULATORY_CARE_PROVIDER_SITE_OTHER): Payer: Medicare Other | Admitting: Podiatry

## 2020-09-20 DIAGNOSIS — B351 Tinea unguium: Secondary | ICD-10-CM | POA: Diagnosis not present

## 2020-09-20 DIAGNOSIS — M79674 Pain in right toe(s): Secondary | ICD-10-CM | POA: Diagnosis not present

## 2020-09-20 DIAGNOSIS — M79675 Pain in left toe(s): Secondary | ICD-10-CM

## 2020-09-20 NOTE — Progress Notes (Signed)
   SUBJECTIVE Patient presents to office today complaining of elongated, thickened nails that cause pain while ambulating in shoes.  He is unable to trim his own nails. Patient is here for further evaluation and treatment.  Past Medical History:  Diagnosis Date  . Adenomatous polyp   . Bleeding internal hemorrhoids 10/2017  . Cerebral venous thrombosis   . Grade I diastolic dysfunction 82/80/0349   noted on ECHO   . Hemorrhoids   . History of kidney stones   . Hypercholesteremia   . Hypertension   . Intracranial hemorrhage (HCC)    Right temporal lobe  . LVH (left ventricular hypertrophy) 07/11/2017   Mild, noted on ECHO   . Muscular degeneration   . Nephrolithiasis   . Obesity   . OSA (obstructive sleep apnea)   . Polycythemia   . Prostate cancer ([place]) 2003  . Shingles   . Stroke ([place]) 06/2017   No residual  . Unsteady gait   . Ureteral calculi 02/12/2008   Left    OBJECTIVE General Patient is awake, alert, and oriented x 3 and in no acute distress. Derm Skin is dry and supple bilateral. Negative open lesions or macerations. Remaining integument unremarkable. Nails are tender, long, thickened and dystrophic with subungual debris, consistent with onychomycosis, 1-5 bilateral. No signs of infection noted. Vasc  DP and PT pedal pulses palpable bilaterally. Temperature gradient within normal limits.  Neuro Epicritic and protective threshold sensation grossly intact bilaterally.  Musculoskeletal Exam No symptomatic pedal deformities noted bilateral. Muscular strength within normal limits.  ASSESSMENT 1. Onychodystrophic nails 1-5 bilateral with hyperkeratosis of nails.  2. Onychomycosis of nail due to dermatophyte bilateral 3. Pain in foot bilateral  PLAN OF CARE 1. Patient evaluated today.  2. Instructed to maintain good pedal hygiene and foot care.  3. Mechanical debridement of nails 1-5 bilaterally performed using a nail nipper. Filed with dremel without incident.  4.  Return to clinic in 3 mos.    Edrick Kins, DPM [facility]  Dr. Edrick Kins, [place]                                        [place], [place] [address]                Office [phone]  Fax [phone]

## 2020-10-26 DIAGNOSIS — Z20822 Contact with and (suspected) exposure to covid-19: Secondary | ICD-10-CM | POA: Diagnosis not present

## 2020-10-31 ENCOUNTER — Encounter (INDEPENDENT_AMBULATORY_CARE_PROVIDER_SITE_OTHER): Payer: Medicare Other | Admitting: Ophthalmology

## 2020-10-31 ENCOUNTER — Ambulatory Visit: Payer: Medicare Other | Attending: Internal Medicine

## 2020-10-31 DIAGNOSIS — Z20822 Contact with and (suspected) exposure to covid-19: Secondary | ICD-10-CM | POA: Diagnosis not present

## 2020-11-02 LAB — NOVEL CORONAVIRUS, NAA: SARS-CoV-2, NAA: DETECTED — AB

## 2020-11-02 LAB — SARS-COV-2, NAA 2 DAY TAT

## 2020-11-03 ENCOUNTER — Telehealth: Payer: Self-pay

## 2020-11-03 NOTE — Telephone Encounter (Signed)
Called to discuss with patient about COVID-19 symptoms and the use of one of the available treatments for those with mild to moderate Covid symptoms and at a high risk of hospitalization.  Pt appears to qualify for outpatient treatment due to co-morbid conditions and/or a member of an at-risk group in accordance with the FDA Emergency Use Authorization.    Symptom onset:  Vaccinated:  Booster?  Immunocompromised? No Qualifiers: HTN NIH Criteria: Tier 1  Declines further treatment.   Justin Adams

## 2020-11-29 ENCOUNTER — Other Ambulatory Visit: Payer: Self-pay

## 2020-11-29 ENCOUNTER — Encounter (INDEPENDENT_AMBULATORY_CARE_PROVIDER_SITE_OTHER): Payer: Self-pay | Admitting: Ophthalmology

## 2020-11-29 ENCOUNTER — Ambulatory Visit (INDEPENDENT_AMBULATORY_CARE_PROVIDER_SITE_OTHER): Payer: Medicare Other | Admitting: Ophthalmology

## 2020-11-29 DIAGNOSIS — H353221 Exudative age-related macular degeneration, left eye, with active choroidal neovascularization: Secondary | ICD-10-CM | POA: Diagnosis not present

## 2020-11-29 DIAGNOSIS — H353132 Nonexudative age-related macular degeneration, bilateral, intermediate dry stage: Secondary | ICD-10-CM | POA: Diagnosis not present

## 2020-11-29 MED ORDER — BEVACIZUMAB 2.5 MG/0.1ML IZ SOSY
2.5000 mg | PREFILLED_SYRINGE | INTRAVITREAL | Status: AC | PRN
  Administered 2020-11-29: 10:00:00 2.5 mg via INTRAVITREAL

## 2020-11-29 NOTE — Progress Notes (Signed)
11/29/2020     CHIEF COMPLAINT Patient presents for No chief complaint on file.   HISTORY OF PRESENT ILLNESS: Justin Adams is a 75 y.o. male who presents to the clinic today for:     Referring physician: [name], [place] [place],  [place] [address]  HISTORICAL INFORMATION:   Selected notes from the MEDICAL RECORD NUMBER    Lab Results  Component Value Date   HGBA1C 5.3 07/11/2017     CURRENT MEDICATIONS: No current outpatient medications on file. (Ophthalmic Drugs)   No current facility-administered medications for this visit. (Ophthalmic Drugs)   Current Outpatient Medications (Other)  Medication Sig   acetaminophen (TYLENOL) 500 MG tablet Take 500-1,000 mg by mouth daily as needed for moderate pain or headache.   amLODipine (NORVASC) 2.5 MG tablet Take 1 tablet (2.5 mg total) by mouth daily as needed (if systolic bp is over 277).   apixaban (ELIQUIS) 5 MG TABS tablet Take 1 tablet (5 mg total) by mouth 2 (two) times daily.   atorvastatin (LIPITOR) 20 MG tablet Take 1 tablet (20 mg total) by mouth daily.   lisinopril (PRINIVIL,ZESTRIL) 20 MG tablet Take 1 tablet (20 mg total) by mouth daily.   Melatonin 3 MG TABS Take 3 mg by mouth at bedtime as needed (sleep).   polyethylene glycol (MIRALAX / GLYCOLAX) packet Take 17 g by mouth at bedtime.   Wheat Dextrin (BENEFIBER) POWD Take 1 Dose by mouth daily.   No current facility-administered medications for this visit. (Other)      REVIEW OF SYSTEMS:    ALLERGIES Allergies  Allergen Reactions   Codeine Nausea And Vomiting    PAST MEDICAL HISTORY Past Medical History:  Diagnosis Date   Adenomatous polyp    Bleeding internal hemorrhoids 10/2017   Cerebral venous thrombosis    Grade I diastolic dysfunction 82/42/3536   noted on ECHO    Hemorrhoids    History of kidney stones    Hypercholesteremia    Hypertension    Intracranial hemorrhage (HCC)    Right temporal lobe    LVH (left ventricular hypertrophy) 07/11/2017   Mild, noted on ECHO    Muscular degeneration    Nephrolithiasis    Obesity    OSA (obstructive sleep apnea)    Polycythemia    Prostate cancer ([place]) 2003   Shingles    Stroke ([place]) 06/2017   No residual   Unsteady gait    Ureteral calculi 02/12/2008   Left   Past Surgical History:  Procedure Laterality Date   APPENDECTOMY     childhood   CHOLECYSTECTOMY     COLONOSCOPY     HEMORRHOID BANDING  2015   HOLMIUM LASER APPLICATION Left 14/09/3152   Procedure: HOLMIUM LASER APPLICATION;  Surgeon: Franchot Gallo, MD;  Location: [facility];  Service: Urology;  Laterality: Left;   IR ANGIO EXTERNAL CAROTID SEL EXT CAROTID UNI R MOD SED  07/10/2017   IR ANGIO INTRA EXTRACRAN SEL INTERNAL CAROTID BILAT MOD SED  07/10/2017   IR ANGIO VERTEBRAL SEL VERTEBRAL BILAT MOD SED  07/10/2017   IR URETERAL STENT LEFT NEW ACCESS W/O SEP NEPHROSTOMY CATH  03/12/2018   LEFT HEART CATHETERIZATION WITH CORONARY ANGIOGRAM N/A 03/15/2013   Procedure: LEFT HEART CATHETERIZATION WITH CORONARY ANGIOGRAM;  Surgeon: Minus Breeding, MD;  Location[facility] CATH LAB;  Service: Cardiovascular;  Laterality: N/A;   NEPHROLITHOTOMY Left 03/12/2018   Procedure: LEFT NEPHROLITHOTOMY PERCUTANEOUS, STENT PLACEMENT;  Surgeon: Franchot Gallo, MD;  Location: [facility];  Service: Urology;  Laterality: Left;   PROSTATECTOMY     STONE EXTRACTION WITH BASKET      FAMILY HISTORY Family History  Problem Relation Age of Onset   Hypertension Mother    Coronary artery disease Mother 19   Breast cancer Mother    Coronary artery disease Father 62       Died age 26   CAD Brother 44    SOCIAL HISTORY Social History   Tobacco Use   Smoking status: Never   Smokeless tobacco: Never  Vaping Use   Vaping Use: Never used  Substance Use Topics   Alcohol use: No   Drug use: No         OPHTHALMIC EXAM:  Base Eye Exam     Visual Acuity (ETDRS)       Right Left   Dist cc 20/30 CF at  5'   Dist ph cc 20/25          Visual Fields       Left Right    Full    Restrictions  Central scotoma         Extraocular Movement       Right Left    Full, Ortho Full, Ortho         Neuro/Psych     Oriented x3: Yes   Mood/Affect: Normal         Dilation     Left eye: 1.0% Mydriacyl, 2.5% Phenylephrine @ 9:47 AM           Slit Lamp and Fundus Exam     External Exam       Right Left   External Normal Normal         Slit Lamp Exam       Right Left   Lids/Lashes Normal Normal   Conjunctiva/Sclera White and quiet White and quiet   Cornea Clear Clear   Anterior Chamber Deep and quiet Deep and quiet   Iris Round and reactive Round and reactive   Lens Posterior chamber intraocular lens Posterior chamber intraocular lens   Anterior Vitreous Normal Normal         Fundus Exam       Right Left   Posterior Vitreous  Posterior vitreous detachment, Central vitreous floaters   Disc  Normal   C/D Ratio  0.45   Macula   Retinal pigment epithelial mottling, Subretinal fibrosis, Hard drusen, Disciform scar,peripheral scarring , no macular thickening   Vessels  Normal   Periphery  Normal, attached, 25 d, 90 d            IMAGING AND PROCEDURES  Imaging and Procedures for 11/29/20  OCT, Retina - OU - Both Eyes       Right Eye Quality was good. Scan locations included subfoveal. Central Foveal Thickness: 286. Progression has been stable. Findings include retinal drusen , abnormal foveal contour.   Left Eye Quality was good. Scan locations included subfoveal. Central Foveal Thickness: 220. Progression has improved. Findings include subretinal scarring, disciform scar, choroidal neovascular membrane, abnormal foveal contour, retinal drusen .   Notes Much less active disease left eye with disciform scar subfoveal left eye we will continue with intravitreal Avastin currently at  12-week follow-up today OS     Intravitreal Injection, Pharmacologic  Agent - OS - Left Eye       Time Out 11/29/2020. 10:31 AM. Confirmed correct patient, procedure, site, and patient consented.   Anesthesia Topical anesthesia  was used. Anesthetic medications included Akten 3.5%.   Procedure Preparation included Ofloxacin , Tobramycin 0.3%, 5% betadine to ocular surface. A 30 gauge needle was used.   Injection: 2.5 mg bevacizumab 2.5 MG/0.1ML   Route: Intravitreal   NDC: 63875-643-32, Lot: [id number]   Post-op Post injection exam found visual acuity of at least counting fingers. The patient tolerated the procedure well. There were no complications. The patient received written and verbal post procedure care education. Post injection medications were not given.              ASSESSMENT/PLAN:  Exudative age-related macular degeneration of left eye with active choroidal neovascularization (HCC) The nature of wet macular degeneration was discussed with the patient.  Forms of therapy reviewed include the use of Anti-VEGF medications injected painlessly into the eye, as well as other possible treatment modalities, including thermal laser therapy. Fellow eye involvement and risks were discussed with the patient. Upon the finding of wet age related macular degeneration, treatment will be offered. The treatment regimen is on a treat as needed basis with the intent to treat if necessary and extend interval of exams when possible. On average 1 out of 6 patients do not need lifetime therapy. However, the risk of recurrent disease is high for a lifetime.  Initially monthly, then periodic, examinations and evaluations will determine whether the next treatment is required on the day of the examination.   Intermediate stage nonexudative age-related macular degeneration of both eyes No signs of CNVM OD by OCT      ICD-10-CM   1. Exudative age-related macular degeneration of left eye with active choroidal neovascularization (HCC)  H35.3221 OCT, Retina - OU - Both Eyes     Intravitreal Injection, Pharmacologic Agent - OS - Left Eye    bevacizumab (AVASTIN) SOSY 2.5 mg    2. Intermediate stage nonexudative age-related macular degeneration of both eyes  H35.3132       1.  We will repeat injection intravitreal Avastin today at 72-month interval to maintain quiescent's of disease.  2.  Dilate OU next in 4 months no planned injection  3.  Ophthalmic Meds Ordered this visit:  Meds ordered this encounter  Medications   bevacizumab (AVASTIN) SOSY 2.5 mg       Return in about 4 months (around 03/31/2021) for DILATE OU, OCT, COLOR FP.  There are no Patient Instructions on file for this visit.   Explained the diagnoses, plan, and follow up with the patient and they expressed understanding.  Patient expressed understanding of the importance of proper follow up care.   Clent Demark Angelique Chevalier M.D. Diseases & Surgery of the Retina and Vitreous [facility] [place] 11/29/20     Abbreviations: M myopia (nearsighted); A astigmatism; H hyperopia (farsighted); P presbyopia; Mrx spectacle prescription;  CTL contact lenses; OD right eye; OS left eye; OU both eyes  XT exotropia; ET esotropia; PEK punctate epithelial keratitis; PEE punctate epithelial erosions; DES dry eye syndrome; MGD meibomian gland dysfunction; ATs artificial tears; PFAT's preservative free artificial tears; [place] nuclear sclerotic cataract; PSC posterior subcapsular cataract; ERM epi-retinal membrane; PVD posterior vitreous detachment; RD retinal detachment; DM diabetes mellitus; DR diabetic retinopathy; NPDR non-proliferative diabetic retinopathy; PDR proliferative diabetic retinopathy; CSME clinically significant macular edema; DME diabetic macular edema; dbh dot blot hemorrhages; CWS cotton wool spot; POAG primary open angle glaucoma; C/D cup-to-disc ratio; HVF humphrey visual field; GVF goldmann visual field; OCT optical coherence tomography; IOP intraocular pressure; BRVO Branch retinal vein  occlusion; CRVO  central retinal vein occlusion; CRAO central retinal artery occlusion; BRAO branch retinal artery occlusion; RT retinal tear; SB scleral buckle; PPV pars plana vitrectomy; VH Vitreous hemorrhage; PRP panretinal laser photocoagulation; IVK intravitreal kenalog; VMT vitreomacular traction; MH Macular hole;  NVD neovascularization of the disc; NVE neovascularization elsewhere; AREDS age related eye disease study; ARMD age related macular degeneration; POAG primary open angle glaucoma; EBMD epithelial/anterior basement membrane dystrophy; ACIOL anterior chamber intraocular lens; IOL intraocular lens; PCIOL posterior chamber intraocular lens; Phaco/IOL phacoemulsification with intraocular lens placement; [place] photorefractive keratectomy; LASIK laser assisted in situ keratomileusis; HTN hypertension; DM diabetes mellitus; COPD chronic obstructive pulmonary disease

## 2020-11-29 NOTE — Assessment & Plan Note (Signed)
No signs of CNVM OD by OCT

## 2020-11-29 NOTE — Assessment & Plan Note (Signed)

## 2020-12-04 ENCOUNTER — Ambulatory Visit: Payer: Medicare Other | Admitting: Neurology

## 2020-12-07 NOTE — Progress Notes (Signed)
NEUROLOGY FOLLOW UP OFFICE NOTE  OREL COOLER [id number]  Assessment/Plan:   New onset right frontal headache - probable primary stabbing headache resolved History of cerebral venous thrombosis with right temporal intracerebral hemorrhage.  1.Follow up as needed.  Subjective:  Justin Adams is a 75 year old right-handed male with hypertension, hypercholesterolemia, and OSA, history of prostate cancer, and history of cerebral hemorrhage secondary to cerebral venous thrombosis who presents today with new onset headCHE.     UPDATE: Medications include:  Eliquis, isinopril 20mg , atorvastatin 20mg    In November 2021, he began experiencing new onset paroxysmal headaches.  CT/CTA of head on 06/16/2020 personally reviewed showed sequelae of prior parenchymal hemorrhage and persistent partial thrombosis of right transverse and sigmoid sinuses and persistent multifocal nonocculsive thrombus within the superior sagittal sinus but no acute or new intracranial abnormalities.  Headaches resolved after a couple of months.  He has been doing well.  He notes that he did have a [place] the beach.  He was hurrying and stepped up from the concrete sidewalk onto a wood plank that was an inch off the ground and tripped.  He struck the back of his head on a pole but did not lose consciousness.  No headache, dizziness, nausea, vomiting or confusion.  He just wanted to let me know.  Last week he fell.  He was at the beach where he was hurrying and stepped up from wood to a concrete where he - No loss of consciousness.  Wood inch off ground - and hit a wood pole on butt and head on pole and scraped    HISTORY: He was admitted to [facility] on 07/09/17 for right frontal headache and nausea.  MRI and MRV of head showed right temporal intracerebral hemorrhage with thromboses of the right transverse sinus, sigmoid sinus, and right jugular vein, which was confirmed on diagnostic angiogram.  MRA  of the head showed no intracranial arterial abnormality such as aneurysm.  CT of the chest abdomen and pelvis revealed no malignancy.  2D echocardiogram revealed ejection fraction of 55%.  DRVVT borderline elevated at 49.7 but otherwise hypercoagulable panel was negative.  LDL was 103 and hemoglobin A1c was 5.3.  He has history of polycythemia with Hgb level 17.3-17.5 going back several years.  Hgb on day of admission was 17.5.  Subsequent Hgb levels have been around 16.  He was started on IV heparin and was subsequently transitioned to Eliquis.  He was also discharged on Lipitor 20 mg daily.   He later had a CTV on 09/05/17 which demonstrated interval resolution of the right temporal lobe intraparenchymal hemorrhage with residual encephalomalacia and interval improvement/parital recanalization of right-sided dural venous thrombosis with persistent nonocclusive thrombus within the right transverse and sigmoid sinuses and proximal right internal jugular vein.  In November 2021, he began experiencing new mild to moderate paroxysmal jabs in the right frontal region every 5 to 20 minutes.  Symptoms started 2 months ago.  No severe headache such as how he presented for the cerebral hemorrhage and thrombosis.  No dizziness, sensory deficits, speech deficits, or focal weakness.  He has macular degeneration but no new visual disturbance. He has tried Tylenol and Advil which have been ineffective.  He is concerned because he has no prior history of headaches and this headache is in the same area when he had the [place].  He has been taking melatonin 3mg  at bedtime for insomnia.  PAST MEDICAL HISTORY: Past Medical History:  Diagnosis Date   Adenomatous polyp    Bleeding internal hemorrhoids 10/2017   Cerebral venous thrombosis    Grade I diastolic dysfunction 29/56/2130   noted on ECHO    Hemorrhoids    History of kidney stones    Hypercholesteremia    Hypertension    Intracranial hemorrhage (HCC)    Right  temporal lobe   LVH (left ventricular hypertrophy) 07/11/2017   Mild, noted on ECHO    Muscular degeneration    Nephrolithiasis    Obesity    OSA (obstructive sleep apnea)    Polycythemia    Prostate cancer ([place]) 2003   Shingles    Stroke ([place]) 06/2017   No residual   Unsteady gait    Ureteral calculi 02/12/2008   Left    MEDICATIONS: Current Outpatient Medications on File Prior to Visit  Medication Sig Dispense Refill   acetaminophen (TYLENOL) 500 MG tablet Take 500-1,000 mg by mouth daily as needed for moderate pain or headache.     amLODipine (NORVASC) 2.5 MG tablet Take 1 tablet (2.5 mg total) by mouth daily as needed (if systolic bp is over 865). 90 tablet 0   apixaban (ELIQUIS) 5 MG TABS tablet Take 1 tablet (5 mg total) by mouth 2 (two) times daily. 180 tablet 0   atorvastatin (LIPITOR) 20 MG tablet Take 1 tablet (20 mg total) by mouth daily. 90 tablet 0   lisinopril (PRINIVIL,ZESTRIL) 20 MG tablet Take 1 tablet (20 mg total) by mouth daily. 30 tablet 1   Melatonin 3 MG TABS Take 3 mg by mouth at bedtime as needed (sleep).     polyethylene glycol (MIRALAX / GLYCOLAX) packet Take 17 g by mouth at bedtime.     Wheat Dextrin (BENEFIBER) POWD Take 1 Dose by mouth daily.     No current facility-administered medications on file prior to visit.    ALLERGIES: Allergies  Allergen Reactions   Codeine Nausea And Vomiting    FAMILY HISTORY: Family History  Problem Relation Age of Onset   Hypertension Mother    Coronary artery disease Mother 66   Breast cancer Mother    Coronary artery disease Father 29       Died age 63   CAD Brother 12      Objective:  Blood pressure 116/67, pulse 67, height 6' (1.829 m), weight 237 lb 12.8 oz (107.9 kg), SpO2 98 %. General: No acute distress.  Patient appears well-groomed.   Head:  Normocephalic/atraumatic Eyes:  Fundi examined but not visualized Neck: supple, no paraspinal tenderness, full range of motion Heart:  Regular rate and  rhythm Lungs:  Clear to auscultation bilaterally Back: No paraspinal tenderness Neurological Exam: alert and oriented to person, place, and time.  Speech fluent and not dysarthric, language intact.  CN II-XII intact. Bulk and tone normal, muscle strength 5/5 throughout.  Sensation to pinprick intact, vibratory sensation reduced in feet.  Deep tendon reflexes trace throughout.  Finger to nose testing intact.  Mildly wide-based gait. Romberg negative.   [name], DO  CC: [name], MD

## 2020-12-08 ENCOUNTER — Encounter: Payer: Self-pay | Admitting: Neurology

## 2020-12-08 ENCOUNTER — Ambulatory Visit (INDEPENDENT_AMBULATORY_CARE_PROVIDER_SITE_OTHER): Payer: Medicare Other | Admitting: Neurology

## 2020-12-08 ENCOUNTER — Other Ambulatory Visit: Payer: Self-pay

## 2020-12-08 VITALS — BP 116/67 | HR 67 | Ht 72.0 in | Wt 237.8 lb

## 2020-12-08 DIAGNOSIS — Z86718 Personal history of other venous thrombosis and embolism: Secondary | ICD-10-CM | POA: Diagnosis not present

## 2020-12-08 DIAGNOSIS — G4485 Primary stabbing headache: Secondary | ICD-10-CM | POA: Diagnosis not present

## 2020-12-20 ENCOUNTER — Ambulatory Visit (INDEPENDENT_AMBULATORY_CARE_PROVIDER_SITE_OTHER): Payer: Medicare Other | Admitting: Podiatry

## 2020-12-20 ENCOUNTER — Other Ambulatory Visit: Payer: Self-pay

## 2020-12-20 DIAGNOSIS — M79675 Pain in left toe(s): Secondary | ICD-10-CM | POA: Diagnosis not present

## 2020-12-20 DIAGNOSIS — M79674 Pain in right toe(s): Secondary | ICD-10-CM

## 2020-12-20 DIAGNOSIS — B351 Tinea unguium: Secondary | ICD-10-CM | POA: Diagnosis not present

## 2020-12-20 NOTE — Progress Notes (Signed)
   SUBJECTIVE Patient presents to office today complaining of elongated, thickened nails that cause pain while ambulating in shoes.  He is unable to trim his own nails. Patient is here for further evaluation and treatment.  Past Medical History:  Diagnosis Date   Adenomatous polyp    Bleeding internal hemorrhoids 10/2017   Cerebral venous thrombosis    Grade I diastolic dysfunction 31/05/1623   noted on ECHO    Hemorrhoids    History of kidney stones    Hypercholesteremia    Hypertension    Intracranial hemorrhage (HCC)    Right temporal lobe   LVH (left ventricular hypertrophy) 07/11/2017   Mild, noted on ECHO    Muscular degeneration    Nephrolithiasis    Obesity    OSA (obstructive sleep apnea)    Polycythemia    Prostate cancer ([place]) 2003   Shingles    Stroke ([place]) 06/2017   No residual   Unsteady gait    Ureteral calculi 02/12/2008   Left    OBJECTIVE General Patient is awake, alert, and oriented x 3 and in no acute distress. Derm Skin is dry and supple bilateral. Negative open lesions or macerations. Remaining integument unremarkable. Nails are tender, long, thickened and dystrophic with subungual debris, consistent with onychomycosis, 1-5 bilateral. No signs of infection noted. Vasc  DP and PT pedal pulses palpable bilaterally. Temperature gradient within normal limits.  Neuro Epicritic and protective threshold sensation grossly intact bilaterally.  Musculoskeletal Exam No symptomatic pedal deformities noted bilateral. Muscular strength within normal limits.  ASSESSMENT 1. Onychodystrophic nails 1-5 bilateral with hyperkeratosis of nails.  2. Onychomycosis of nail due to dermatophyte bilateral 3. Pain in foot bilateral  PLAN OF CARE 1. Patient evaluated today.  2. Instructed to maintain good pedal hygiene and foot care.  3. Mechanical debridement of nails 1-5 bilaterally performed using a nail nipper. Filed with dremel without incident.  4. Return to clinic in  3 mos.    Edrick Kins, DPM [facility]  Dr. Edrick Kins, [place]                                        [place], [place] [address]                Office [phone]  Fax [phone]

## 2021-01-14 IMAGING — CT CT ANGIO HEAD
1 of 3 series · 12 of 30 positions shown · IV contrast (iopamidol)
Comparison: [id number]

CLINICAL DATA: Sharp stabbing pain above right eye, history of
intracranial hemorrhage secondary to dural venous thrombosis

EXAM:
CT ANGIOGRAPHY HEAD
TECHNIQUE: Multidetector CT imaging of the head was performed using the
standard protocol during bolus administration of intravenous
contrast. Multiplanar CT image reconstructions and MIPs were
obtained to evaluate the vascular anatomy.
CONTRAST:  75mL 7XRMXL-MYW IOPAMIDOL (7XRMXL-MYW) INJECTION 76%

[Series 6: head angio · axial · 0.48mm/px · z∈[-162,-15]mm · 12 of 59 slices shown]
[im 5/59  brain]
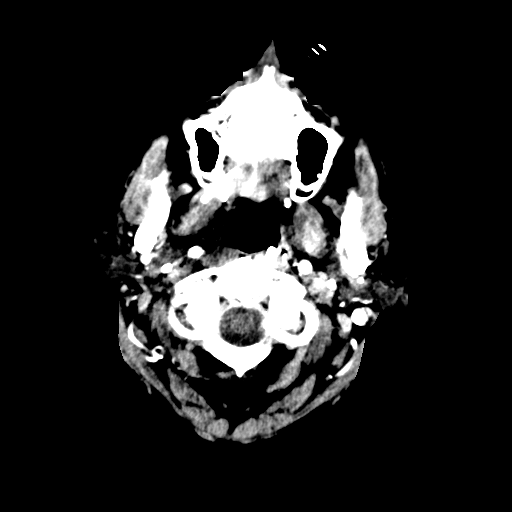
[im 9/59  bone]
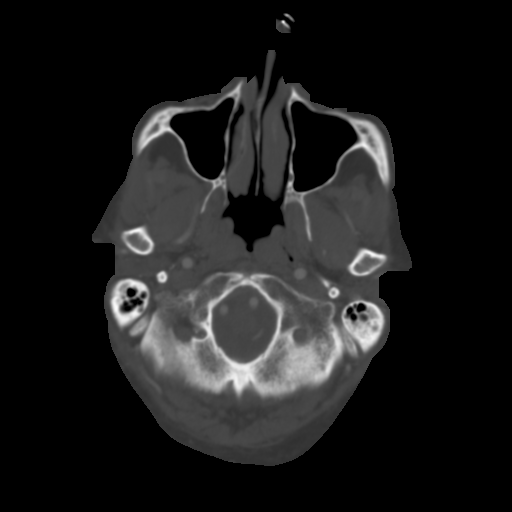
[im 14/59  brain]
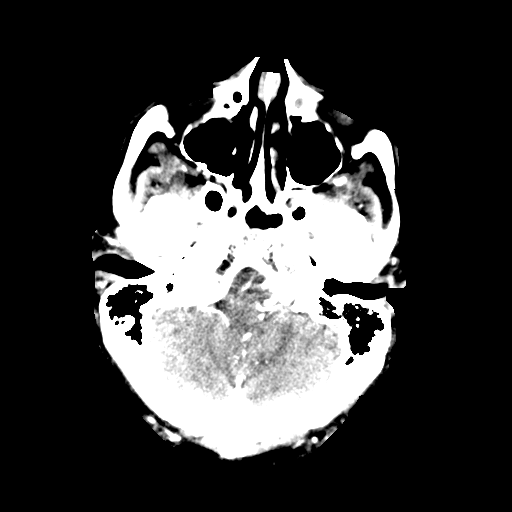
[im 18/59  bone]
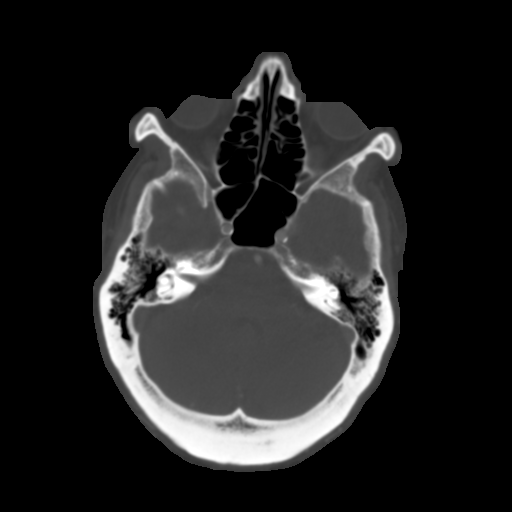
[im 23/59  brain]
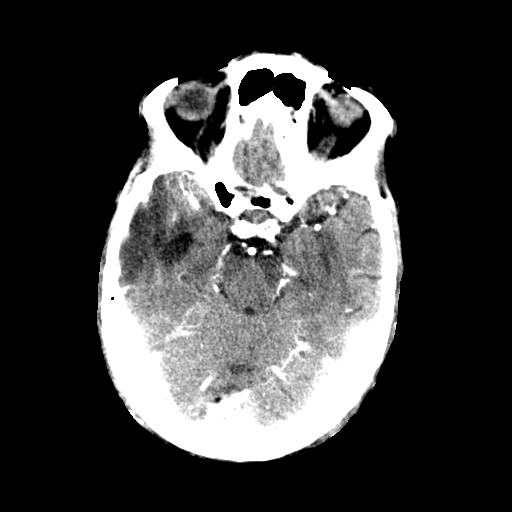
[im 27/59  bone]
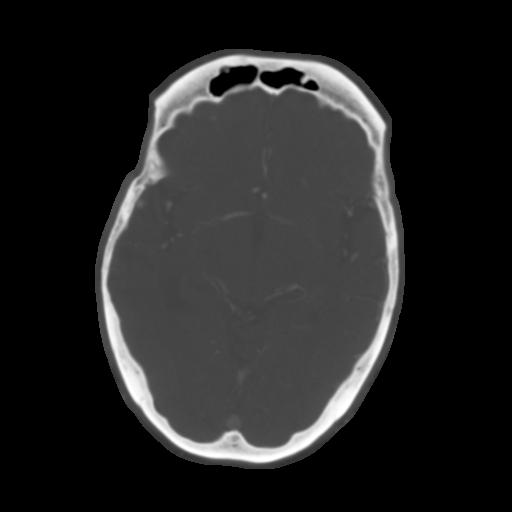
[im 32/59  brain]
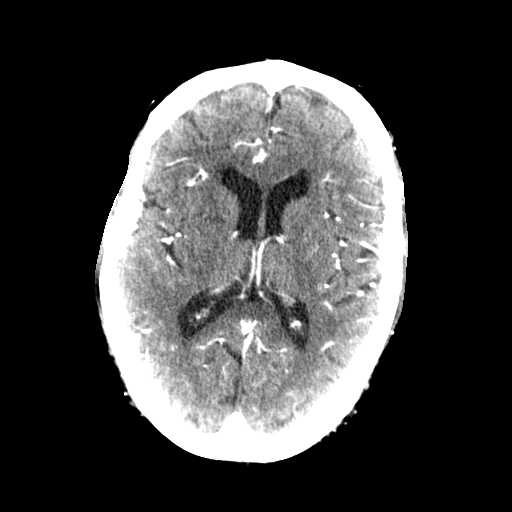
[im 36/59  bone]
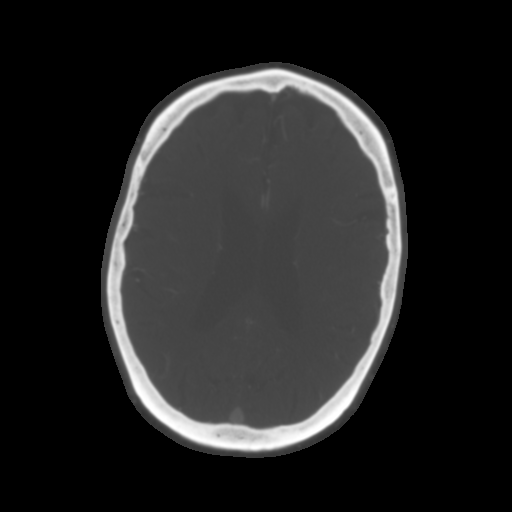
[im 41/59  brain]
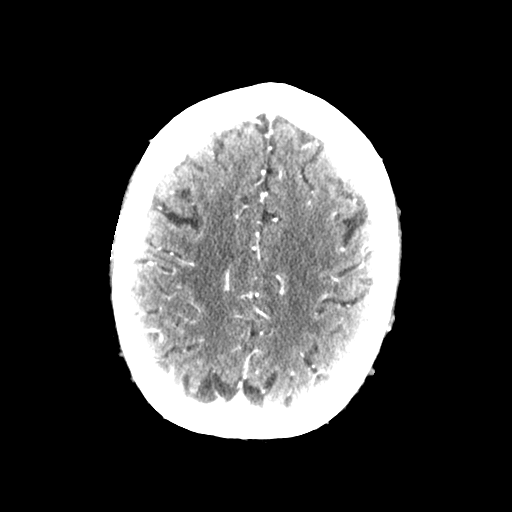
[im 45/59  bone]
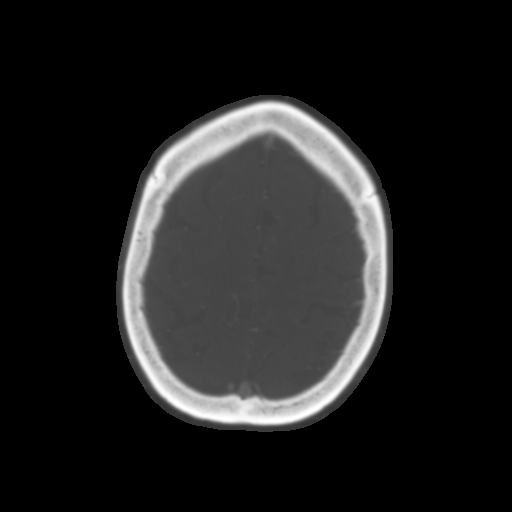
[im 50/59  brain]
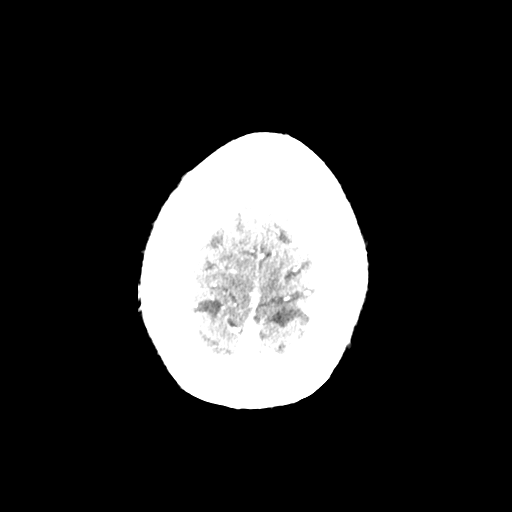
[im 54/59  bone]
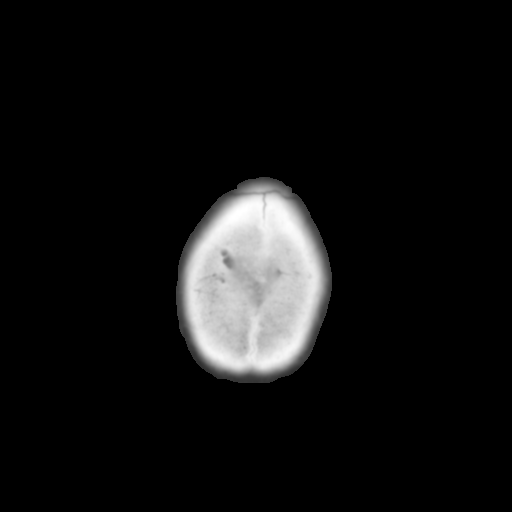

[12 of 30 positions shown; findings below may reference images not displayed]

FINDINGS: CT HEAD

Brain: There is no acute intracranial hemorrhage, mass effect, or
edema. No acute appearing loss of gray-white differentiation. There
is right temporal encephalomalacia at the site of prior parenchymal
hemorrhage. Additional patchy areas of hypoattenuation in the
supratentorial white matter are nonspecific but probably reflect
stable chronic microvascular ischemic changes. There is ex vacuo
dilatation of the right temporal horn. Ventricles are stable in
size. There is no extra-axial fluid collection.

Vascular: There is atherosclerotic calcification at the skull base.

Skull: Calvarium is unremarkable.

Sinuses/Orbits: Bilateral lens replacements. Paranasal sinuses are
aerated.

Other: None.

Review of the MIP images confirms the above findings

CTA HEAD

Anterior circulation: Intracranial internal carotid arteries are
patent with calcified plaque but no significant stenosis. Anterior
cerebral arteries are patent. Anterior communicating artery is
present. Middle cerebral arteries are patent.

Posterior circulation: Intracranial vertebral arteries, basilar
artery, and posterior cerebral arteries are patent. A left posterior
communicating artery is identified. There is focal mild stenosis of
the right P2 PCA.

Venous sinuses: As permitted by contrast timing, there is persistent
partial thrombosis of the right transverse and sigmoid sinuses.
There is also persistent multifocal nonocclusive thrombus within the
superior sagittal sinus.

Review of the MIP images confirms the above findings
IMPRESSION: No acute intracranial abnormality. Sequelae of prior parenchymal
hemorrhage. Chronic microvascular ischemic changes.

Study not targeted for evaluation of veins. Persistent partial
thrombosis of right transverse and sigmoid sinuses and persistent
multifocal nonocclusive thrombus within the superior sagittal sinus.

## 2021-02-20 DIAGNOSIS — Z1389 Encounter for screening for other disorder: Secondary | ICD-10-CM | POA: Diagnosis not present

## 2021-02-20 DIAGNOSIS — I700 Atherosclerosis of aorta: Secondary | ICD-10-CM | POA: Diagnosis not present

## 2021-02-20 DIAGNOSIS — G4733 Obstructive sleep apnea (adult) (pediatric): Secondary | ICD-10-CM | POA: Diagnosis not present

## 2021-02-20 DIAGNOSIS — H353221 Exudative age-related macular degeneration, left eye, with active choroidal neovascularization: Secondary | ICD-10-CM | POA: Diagnosis not present

## 2021-02-20 DIAGNOSIS — Z0000 Encounter for general adult medical examination without abnormal findings: Secondary | ICD-10-CM | POA: Diagnosis not present

## 2021-02-20 DIAGNOSIS — Z79899 Other long term (current) drug therapy: Secondary | ICD-10-CM | POA: Diagnosis not present

## 2021-02-20 DIAGNOSIS — E7800 Pure hypercholesterolemia, unspecified: Secondary | ICD-10-CM | POA: Diagnosis not present

## 2021-02-20 DIAGNOSIS — I10 Essential (primary) hypertension: Secondary | ICD-10-CM | POA: Diagnosis not present

## 2021-02-20 DIAGNOSIS — R1314 Dysphagia, pharyngoesophageal phase: Secondary | ICD-10-CM | POA: Diagnosis not present

## 2021-02-20 DIAGNOSIS — Z23 Encounter for immunization: Secondary | ICD-10-CM | POA: Diagnosis not present

## 2021-02-23 DIAGNOSIS — R17 Unspecified jaundice: Secondary | ICD-10-CM | POA: Diagnosis not present

## 2021-03-19 DIAGNOSIS — Z23 Encounter for immunization: Secondary | ICD-10-CM | POA: Diagnosis not present

## 2021-03-28 ENCOUNTER — Other Ambulatory Visit: Payer: Self-pay

## 2021-03-28 ENCOUNTER — Ambulatory Visit (INDEPENDENT_AMBULATORY_CARE_PROVIDER_SITE_OTHER): Payer: Medicare Other | Admitting: Podiatry

## 2021-03-28 DIAGNOSIS — M79675 Pain in left toe(s): Secondary | ICD-10-CM | POA: Diagnosis not present

## 2021-03-28 DIAGNOSIS — B351 Tinea unguium: Secondary | ICD-10-CM | POA: Diagnosis not present

## 2021-03-28 DIAGNOSIS — M79674 Pain in right toe(s): Secondary | ICD-10-CM

## 2021-03-28 NOTE — Progress Notes (Signed)
   SUBJECTIVE Patient presents to office today complaining of elongated, thickened nails that cause pain while ambulating in shoes.  He is unable to trim his own nails. Patient is here for further evaluation and treatment.  Past Medical History:  Diagnosis Date   Adenomatous polyp    Bleeding internal hemorrhoids 10/2017   Cerebral venous thrombosis    Grade I diastolic dysfunction 31/05/1623   noted on ECHO    Hemorrhoids    History of kidney stones    Hypercholesteremia    Hypertension    Intracranial hemorrhage (HCC)    Right temporal lobe   LVH (left ventricular hypertrophy) 07/11/2017   Mild, noted on ECHO    Muscular degeneration    Nephrolithiasis    Obesity    OSA (obstructive sleep apnea)    Polycythemia    Prostate cancer ([place]) 2003   Shingles    Stroke ([place]) 06/2017   No residual   Unsteady gait    Ureteral calculi 02/12/2008   Left    OBJECTIVE General Patient is awake, alert, and oriented x 3 and in no acute distress. Derm Skin is dry and supple bilateral. Negative open lesions or macerations. Remaining integument unremarkable. Nails are tender, long, thickened and dystrophic with subungual debris, consistent with onychomycosis, 1-5 bilateral. No signs of infection noted. Vasc  DP and PT pedal pulses palpable bilaterally. Temperature gradient within normal limits.  Neuro Epicritic and protective threshold sensation grossly intact bilaterally.  Musculoskeletal Exam No symptomatic pedal deformities noted bilateral. Muscular strength within normal limits.  ASSESSMENT 1. Onychodystrophic nails 1-5 bilateral with hyperkeratosis of nails.  2. Onychomycosis of nail due to dermatophyte bilateral 3. Pain in foot bilateral  PLAN OF CARE 1. Patient evaluated today.  2. Instructed to maintain good pedal hygiene and foot care.  3. Mechanical debridement of nails 1-5 bilaterally performed using a nail nipper. Filed with dremel without incident.  4. Return to clinic in  3 mos.    Edrick Kins, DPM [facility]  Dr. Edrick Kins, [place]                                        [place], [place] [address]                Office [phone]  Fax [phone]

## 2021-04-05 ENCOUNTER — Other Ambulatory Visit: Payer: Self-pay

## 2021-04-05 ENCOUNTER — Ambulatory Visit (INDEPENDENT_AMBULATORY_CARE_PROVIDER_SITE_OTHER): Payer: Medicare Other | Admitting: Ophthalmology

## 2021-04-05 ENCOUNTER — Encounter (INDEPENDENT_AMBULATORY_CARE_PROVIDER_SITE_OTHER): Payer: Self-pay | Admitting: Ophthalmology

## 2021-04-05 DIAGNOSIS — H353221 Exudative age-related macular degeneration, left eye, with active choroidal neovascularization: Secondary | ICD-10-CM | POA: Diagnosis not present

## 2021-04-05 DIAGNOSIS — H43811 Vitreous degeneration, right eye: Secondary | ICD-10-CM | POA: Diagnosis not present

## 2021-04-05 DIAGNOSIS — H353112 Nonexudative age-related macular degeneration, right eye, intermediate dry stage: Secondary | ICD-10-CM | POA: Diagnosis not present

## 2021-04-05 NOTE — Assessment & Plan Note (Signed)
Physiologic OD no therapy required

## 2021-04-05 NOTE — Assessment & Plan Note (Signed)
OS, and active disease today some 4 months post most recent injection Avastin thus some 2.5 months off of therapy.  We will repeat examination next in 4 months

## 2021-04-05 NOTE — Assessment & Plan Note (Signed)
No sign of CNVM OD 

## 2021-04-05 NOTE — Progress Notes (Signed)
04/05/2021     CHIEF COMPLAINT Patient presents for  Chief Complaint  Patient presents with   Retina Follow Up      HISTORY OF PRESENT ILLNESS: Justin Adams is a 75 y.o. male who presents to the clinic today for:   HPI     Retina Follow Up   Patient presents with  Wet AMD.  In both eyes.  This started 4 months ago.  Duration of 4 months.  Since onset it is stable.        Comments   4 month f/u OU with OCT and FP  Pt states he thought his left eye was improving but is not sure of it.      Last edited by Justin Adams, COA on 04/05/2021  8:00 AM.      Referring physician: Lajean Adams, [address] [place],  [place] [address]  HISTORICAL INFORMATION:   Selected notes from the MEDICAL RECORD NUMBER    Lab Results  Component Value Date   HGBA1C 5.3 07/11/2017     CURRENT MEDICATIONS: No current outpatient medications on file. (Ophthalmic Drugs)   No current facility-administered medications for this visit. (Ophthalmic Drugs)   Current Outpatient Medications (Other)  Medication Sig   acetaminophen (TYLENOL) 500 MG tablet Take 500-1,000 mg by mouth daily as needed for moderate pain or headache.   amLODipine (NORVASC) 2.5 MG tablet Take 1 tablet (2.5 mg total) by mouth daily as needed (if systolic bp is over 179). (Patient not taking: Reported on 12/08/2020)   apixaban (ELIQUIS) 5 MG TABS tablet Take 1 tablet (5 mg total) by mouth 2 (two) times daily.   atorvastatin (LIPITOR) 20 MG tablet Take 1 tablet (20 mg total) by mouth daily.   lisinopril (PRINIVIL,ZESTRIL) 20 MG tablet Take 1 tablet (20 mg total) by mouth daily.   Melatonin 3 MG TABS Take 3 mg by mouth at bedtime as needed (sleep).   polyethylene glycol (MIRALAX / GLYCOLAX) packet Take 17 g by mouth at bedtime.   Wheat Dextrin (BENEFIBER) POWD Take 1 Dose by mouth daily.   No current facility-administered medications for this visit. (Other)      REVIEW OF  SYSTEMS:    ALLERGIES Allergies  Allergen Reactions   Codeine Nausea And Vomiting    PAST MEDICAL HISTORY Past Medical History:  Diagnosis Date   Adenomatous polyp    Bleeding internal hemorrhoids 10/2017   Cerebral venous thrombosis    Grade I diastolic dysfunction 15/10/6977   noted on ECHO    Hemorrhoids    History of kidney stones    Hypercholesteremia    Hypertension    Intracranial hemorrhage (HCC)    Right temporal lobe   LVH (left ventricular hypertrophy) 07/11/2017   Mild, noted on ECHO    Muscular degeneration    Nephrolithiasis    Obesity    OSA (obstructive sleep apnea)    Polycythemia    Prostate cancer ([place]) 2003   Shingles    Stroke ([place]) 06/2017   No residual   Unsteady gait    Ureteral calculi 02/12/2008   Left   Past Surgical History:  Procedure Laterality Date   APPENDECTOMY     childhood   CHOLECYSTECTOMY     COLONOSCOPY     HEMORRHOID BANDING  2015   HOLMIUM LASER APPLICATION Left 48/0/1655   Procedure: HOLMIUM LASER APPLICATION;  Surgeon: Justin Gallo, MD;  Location: [facility];  Service: Urology;  Laterality: Left;  IR ANGIO EXTERNAL CAROTID SEL EXT CAROTID UNI R MOD SED  07/10/2017   IR ANGIO INTRA EXTRACRAN SEL INTERNAL CAROTID BILAT MOD SED  07/10/2017   IR ANGIO VERTEBRAL SEL VERTEBRAL BILAT MOD SED  07/10/2017   IR URETERAL STENT LEFT NEW ACCESS W/O SEP NEPHROSTOMY CATH  03/12/2018   LEFT HEART CATHETERIZATION WITH CORONARY ANGIOGRAM N/A 03/15/2013   Procedure: LEFT HEART CATHETERIZATION WITH CORONARY ANGIOGRAM;  Surgeon: Justin Breeding, MD;  Location: [facility] CATH LAB;  Service: Cardiovascular;  Laterality: N/A;   NEPHROLITHOTOMY Left 03/12/2018   Procedure: LEFT NEPHROLITHOTOMY PERCUTANEOUS, STENT PLACEMENT;  Surgeon: Justin Gallo, MD;  Location: [facility];  Service: Urology;  Laterality: Left;   PROSTATECTOMY     STONE EXTRACTION WITH BASKET      FAMILY HISTORY Family History  Problem Relation Age of Onset   Hypertension Mother     Coronary artery disease Mother 21   Breast cancer Mother    Coronary artery disease Father 78       Died age 29   CAD Brother 98    SOCIAL HISTORY Social History   Tobacco Use   Smoking status: Never   Smokeless tobacco: Never  Vaping Use   Vaping Use: Never used  Substance Use Topics   Alcohol use: No   Drug use: No         OPHTHALMIC EXAM:  Base Eye Exam     Visual Acuity (ETDRS)       Right Left   Dist cc 20/32 -1 20/400 ecc   Dist ph cc 20/25 -2 NI    Correction: Glasses         Tonometry (Tonopen, 8:06 AM)       Right Left   Pressure 12 13         Pupils       Pupils Dark Light Shape React APD   Right PERRL 4 3 Round Brisk None   Left PERRL 4 3 Round Brisk None         Visual Fields       Left Right    Full Full         Extraocular Movement       Right Left    Full, Ortho Full, Ortho         Neuro/Psych     Oriented x3: Yes   Mood/Affect: Normal         Dilation     Both eyes: 1.0% Mydriacyl, 2.5% Phenylephrine @ 8:06 AM           Slit Lamp and Fundus Exam     External Exam       Right Left   External Normal Normal         Slit Lamp Exam       Right Left   Lids/Lashes Normal Normal   Conjunctiva/Sclera White and quiet White and quiet   Cornea Clear Clear   Anterior Chamber Deep and quiet Deep and quiet   Iris Round and reactive Round and reactive   Lens Posterior chamber intraocular lens Posterior chamber intraocular lens   Anterior Vitreous Normal Normal         Fundus Exam       Right Left   Posterior Vitreous Posterior vitreous detachment Posterior vitreous detachment, Central vitreous floaters   Disc Normal Normal   C/D Ratio 0.55 0.45   Macula Intermediate age related macular degeneration, no exudates, no hemorrhage, no membrane  Retinal pigment epithelial mottling, Subretinal  fibrosis, Hard drusen, Disciform scar,peripheral scarring , no macular thickening, Geographic atrophy subfoveal  approximately 12 DAs size   Vessels Normal Normal   Periphery Normal Normal, attached, 25 d, 90 d            IMAGING AND PROCEDURES  Imaging and Procedures for 04/05/21  OCT, Retina - OU - Both Eyes       Right Eye Quality was good. Scan locations included subfoveal. Central Foveal Thickness: 287. Progression has been stable. Findings include retinal drusen , abnormal foveal contour.   Left Eye Quality was good. Scan locations included subfoveal. Central Foveal Thickness: 213. Progression has improved. Findings include subretinal scarring, disciform scar, abnormal foveal contour, retinal drusen .   Notes Much less active disease left eye with disciform scar subfoveal left eye  at 43-month follow-up today OS, with no signs of active disease     Color Fundus Photography Optos - OU - Both Eyes       Right Eye Progression has been stable. Disc findings include normal observations. Macula : drusen. Vessels : normal observations. Periphery : normal observations.   Left Eye Progression has been stable. Macula : drusen, geographic atrophy. Vessels : normal observations. Periphery : normal observations.   Notes Geographic atrophy centrally left eye, old disciform scar.  No active disease.  We will continue to monitor and observe             ASSESSMENT/PLAN:  Exudative age-related macular degeneration of left eye with active choroidal neovascularization (HCC) OS, and active disease today some 4 months post most recent injection Avastin thus some 2.5 months off of therapy.  We will repeat examination next in 4 months  Intermediate stage nonexudative age-related macular degeneration of right eye No sign of CNVM OD  Posterior vitreous detachment, right eye Physiologic OD no therapy required     ICD-10-CM   1. Exudative age-related macular degeneration of left eye with active choroidal neovascularization (HCC)  H35.3221 OCT, Retina - OU - Both Eyes    Color Fundus  Photography Optos - OU - Both Eyes    2. Intermediate stage nonexudative age-related macular degeneration of right eye  H35.3112     3. Posterior vitreous detachment, right eye  H43.811       1.  We will continue to monitor the funduscopic appearance of the left eye to prevent scotoma enlargement from CNVM.  2.  D, patient will notify us promptly of new onset visual acuity distortions or declines  3.  Ophthalmic Meds Ordered this visit:  No orders of the defined types were placed in this encounter.      Return in about 4 months (around 08/06/2021) for COLOR FP, OCT, DILATE OU.  There are no Patient Instructions on file for this visit.   Explained the diagnoses, plan, and follow up with the patient and they expressed understanding.  Patient expressed understanding of the importance of proper follow up care.   Clent Demark Soumya Colson M.D. Diseases & Surgery of the Retina and Vitreous [facility] [place] 04/05/21     Abbreviations: M myopia (nearsighted); A astigmatism; H hyperopia (farsighted); P presbyopia; Mrx spectacle prescription;  CTL contact lenses; OD right eye; OS left eye; OU both eyes  XT exotropia; ET esotropia; PEK punctate epithelial keratitis; PEE punctate epithelial erosions; DES dry eye syndrome; MGD meibomian gland dysfunction; ATs artificial tears; PFAT's preservative free artificial tears; [place] nuclear sclerotic cataract; PSC posterior subcapsular cataract; ERM epi-retinal membrane; PVD posterior vitreous detachment; RD  retinal detachment; DM diabetes mellitus; DR diabetic retinopathy; NPDR non-proliferative diabetic retinopathy; PDR proliferative diabetic retinopathy; CSME clinically significant macular edema; DME diabetic macular edema; dbh dot blot hemorrhages; CWS cotton wool spot; POAG primary open angle glaucoma; C/D cup-to-disc ratio; HVF humphrey visual field; GVF goldmann visual field; OCT optical coherence tomography; IOP intraocular pressure; BRVO Branch  retinal vein occlusion; CRVO central retinal vein occlusion; CRAO central retinal artery occlusion; BRAO branch retinal artery occlusion; RT retinal tear; SB scleral buckle; PPV pars plana vitrectomy; VH Vitreous hemorrhage; PRP panretinal laser photocoagulation; IVK intravitreal kenalog; VMT vitreomacular traction; MH Macular hole;  NVD neovascularization of the disc; NVE neovascularization elsewhere; AREDS age related eye disease study; ARMD age related macular degeneration; POAG primary open angle glaucoma; EBMD epithelial/anterior basement membrane dystrophy; ACIOL anterior chamber intraocular lens; IOL intraocular lens; PCIOL posterior chamber intraocular lens; Phaco/IOL phacoemulsification with intraocular lens placement; [place] photorefractive keratectomy; LASIK laser assisted in situ keratomileusis; HTN hypertension; DM diabetes mellitus; COPD chronic obstructive pulmonary disease

## 2021-07-26 DIAGNOSIS — D1801 Hemangioma of skin and subcutaneous tissue: Secondary | ICD-10-CM | POA: Diagnosis not present

## 2021-07-26 DIAGNOSIS — L821 Other seborrheic keratosis: Secondary | ICD-10-CM | POA: Diagnosis not present

## 2021-07-30 ENCOUNTER — Ambulatory Visit (INDEPENDENT_AMBULATORY_CARE_PROVIDER_SITE_OTHER): Payer: Medicare Other | Admitting: Podiatry

## 2021-07-30 ENCOUNTER — Other Ambulatory Visit: Payer: Self-pay

## 2021-07-30 DIAGNOSIS — B351 Tinea unguium: Secondary | ICD-10-CM | POA: Diagnosis not present

## 2021-07-30 DIAGNOSIS — M79674 Pain in right toe(s): Secondary | ICD-10-CM

## 2021-07-30 DIAGNOSIS — M79675 Pain in left toe(s): Secondary | ICD-10-CM

## 2021-07-30 NOTE — Progress Notes (Signed)
° °  SUBJECTIVE Patient presents to office today complaining of elongated, thickened nails that cause pain while ambulating in shoes.  He is unable to trim his own nails. Patient is here for further evaluation and treatment.  Past Medical History:  Diagnosis Date   Adenomatous polyp    Bleeding internal hemorrhoids 10/2017   Cerebral venous thrombosis    Grade I diastolic dysfunction 07/11/2017   noted on ECHO    Hemorrhoids    History of kidney stones    Hypercholesteremia    Hypertension    Intracranial hemorrhage (HCC)    Right temporal lobe   LVH (left ventricular hypertrophy) 07/11/2017   Mild, noted on ECHO    Muscular degeneration    Nephrolithiasis    Obesity    OSA (obstructive sleep apnea)    Polycythemia    Prostate cancer (HCC) 2003   Shingles    Stroke (HCC) 06/2017   No residual   Unsteady gait    Ureteral calculi 02/12/2008   Left    OBJECTIVE General Patient is awake, alert, and oriented x 3 and in no acute distress. Derm Skin is dry and supple bilateral. Negative open lesions or macerations. Remaining integument unremarkable. Nails are tender, long, thickened and dystrophic with subungual debris, consistent with onychomycosis, 1-5 bilateral. No signs of infection noted. Vasc  DP and PT pedal pulses palpable bilaterally. Temperature gradient within normal limits.  Neuro Epicritic and protective threshold sensation grossly intact bilaterally.  Musculoskeletal Exam No symptomatic pedal deformities noted bilateral. Muscular strength within normal limits.  ASSESSMENT 1. Onychodystrophic nails 1-5 bilateral with hyperkeratosis of nails.  2. Onychomycosis of nail due to dermatophyte bilateral 3. Pain in foot bilateral  PLAN OF CARE 1. Patient evaluated today.  2. Instructed to maintain good pedal hygiene and foot care.  3. Mechanical debridement of nails 1-5 bilaterally performed using a nail nipper. Filed with dremel without incident.  4. Return to clinic in  3 mos.    Aerica Rincon M. Denyse Fillion, DPM [facility]  Dr. Tobi Leinweber M. Kaslyn Richburg, DPM    [address]                                        ,  [id number]                Office [phone]  Fax [phone]   

## 2021-08-01 ENCOUNTER — Encounter: Payer: Self-pay | Admitting: Neurology

## 2021-08-01 ENCOUNTER — Other Ambulatory Visit: Payer: Self-pay

## 2021-08-01 ENCOUNTER — Ambulatory Visit (INDEPENDENT_AMBULATORY_CARE_PROVIDER_SITE_OTHER): Payer: Medicare Other | Admitting: Neurology

## 2021-08-01 VITALS — BP 118/71 | HR 77 | Ht 72.0 in | Wt 226.0 lb

## 2021-08-01 DIAGNOSIS — N261 Atrophy of kidney (terminal): Secondary | ICD-10-CM | POA: Diagnosis not present

## 2021-08-01 DIAGNOSIS — N132 Hydronephrosis with renal and ureteral calculous obstruction: Secondary | ICD-10-CM | POA: Diagnosis not present

## 2021-08-01 DIAGNOSIS — N200 Calculus of kidney: Secondary | ICD-10-CM | POA: Diagnosis not present

## 2021-08-01 DIAGNOSIS — Z86718 Personal history of other venous thrombosis and embolism: Secondary | ICD-10-CM | POA: Diagnosis not present

## 2021-08-01 DIAGNOSIS — K439 Ventral hernia without obstruction or gangrene: Secondary | ICD-10-CM | POA: Diagnosis not present

## 2021-08-01 DIAGNOSIS — G4485 Primary stabbing headache: Secondary | ICD-10-CM | POA: Diagnosis not present

## 2021-08-01 DIAGNOSIS — I700 Atherosclerosis of aorta: Secondary | ICD-10-CM | POA: Diagnosis not present

## 2021-08-01 NOTE — Patient Instructions (Signed)
Increase melatonin to 9 to 12mg  at bedtime.  If no improvement after 3-4 weeks, we can change to gabapentin Follow up 4 months.

## 2021-08-01 NOTE — Progress Notes (Signed)
NEUROLOGY FOLLOW UP OFFICE NOTE  DEMIAN MAISEL [id number]  Assessment/Plan:   Primary stabbing headache History of cerebral venous thrombosis with right temporal intracerebral hemorrhage.   1.advised to increase melatonin to 9 to 12mg  at bedtime.  If ineffective, would try gabapentin.  As he is already on Eliquis, would rather not start indomethacin. 2  Follow up 4 months   Subjective:  Kendre Jacinto is a 76 year old right-handed male with hypertension, hypercholesterolemia, and OSA, history of prostate cancer, and history of cerebral hemorrhage secondary to cerebral venous thrombosis who follows up for headache.   UPDATE: Medications include:  Eliquis, lisinopril 20mg , atorvastatin 20mg , melatonin 3mg  at bedtime   Last seen in July 2022.  At that time, he had been headache-free for several months.  Headaches returned 4 weeks ago.  They were occurring daily.  They have started getting better.  No headaches last 2 days.  He reports some increased stress that may be a trigger.   HISTORY: He was admitted to [facility] on 07/09/17 for right frontal headache and nausea.  MRI and MRV of head showed right temporal intracerebral hemorrhage with thromboses of the right transverse sinus, sigmoid sinus, and right jugular vein, which was confirmed on diagnostic angiogram.  MRA of the head showed no intracranial arterial abnormality such as aneurysm.  CT of the chest abdomen and pelvis revealed no malignancy.  2D echocardiogram revealed ejection fraction of 55%.  DRVVT borderline elevated at 49.7 but otherwise hypercoagulable panel was negative.  LDL was 103 and hemoglobin A1c was 5.3.  He has history of polycythemia with Hgb level 17.3-17.5 going back several years.  Hgb on day of admission was 17.5.  Subsequent Hgb levels have been around 16.  He was started on IV heparin and was subsequently transitioned to Eliquis.  He was also discharged on Lipitor 20 mg daily.   He later had a  CTV on 09/05/17 which demonstrated interval resolution of the right temporal lobe intraparenchymal hemorrhage with residual encephalomalacia and interval improvement/parital recanalization of right-sided dural venous thrombosis with persistent nonocclusive thrombus within the right transverse and sigmoid sinuses and proximal right internal jugular vein.   In November 2021, he began experiencing new mild to moderate paroxysmal jabs in the right frontal region every 5 to 20 minutes.  No severe headache such as how he presented for the cerebral hemorrhage and thrombosis.  No dizziness, sensory deficits, speech deficits, or focal weakness.  He has macular degeneration but no new visual disturbance. He has tried Tylenol and Advil which have been ineffective.  He has no prior history of headaches and this headache is in the same area when he had the [place].  CT/CTA of head on 06/16/2020 personally reviewed showed sequelae of prior parenchymal hemorrhage and persistent partial thrombosis of right transverse and sigmoid sinuses and persistent multifocal nonocculsive thrombus within the superior sagittal sinus but no acute or new intracranial abnormalities.  Headaches resolved after a couple of months.  PAST MEDICAL HISTORY: Past Medical History:  Diagnosis Date   Adenomatous polyp    Bleeding internal hemorrhoids 10/2017   Cerebral venous thrombosis    Grade I diastolic dysfunction 59/16/3846   noted on ECHO    Hemorrhoids    History of kidney stones    Hypercholesteremia    Hypertension    Intracranial hemorrhage (HCC)    Right temporal lobe   LVH (left ventricular hypertrophy) 07/11/2017   Mild, noted on ECHO    Muscular degeneration  Nephrolithiasis    Obesity    OSA (obstructive sleep apnea)    Polycythemia    Prostate cancer ([place]) 2003   Shingles    Stroke ([place]) 06/2017   No residual   Unsteady gait    Ureteral calculi 02/12/2008   Left    MEDICATIONS: Current Outpatient Medications on  File Prior to Visit  Medication Sig Dispense Refill   acetaminophen (TYLENOL) 500 MG tablet Take 500-1,000 mg by mouth daily as needed for moderate pain or headache.     amLODipine (NORVASC) 2.5 MG tablet Take 1 tablet (2.5 mg total) by mouth daily as needed (if systolic bp is over 983). (Patient not taking: Reported on 12/08/2020) 90 tablet 0   apixaban (ELIQUIS) 5 MG TABS tablet Take 1 tablet (5 mg total) by mouth 2 (two) times daily. 180 tablet 0   atorvastatin (LIPITOR) 20 MG tablet Take 1 tablet (20 mg total) by mouth daily. 90 tablet 0   lisinopril (PRINIVIL,ZESTRIL) 20 MG tablet Take 1 tablet (20 mg total) by mouth daily. 30 tablet 1   Melatonin 3 MG TABS Take 3 mg by mouth at bedtime as needed (sleep).     polyethylene glycol (MIRALAX / GLYCOLAX) packet Take 17 g by mouth at bedtime.     Wheat Dextrin (BENEFIBER) POWD Take 1 Dose by mouth daily.     No current facility-administered medications on file prior to visit.    ALLERGIES: Allergies  Allergen Reactions   Codeine Nausea And Vomiting    FAMILY HISTORY: Family History  Problem Relation Age of Onset   Hypertension Mother    Coronary artery disease Mother 61   Breast cancer Mother    Coronary artery disease Father 42       Died age 40   CAD Brother 70      Objective:  Blood pressure 118/71, pulse 77, height 6' (1.829 m), weight 226 lb (102.5 kg), SpO2 94 %. General: No acute distress.  Patient appears well-groomed.   Head:  Normocephalic/atraumatic Eyes:  Fundi examined but not visualized Neck: supple, no paraspinal tenderness, full range of motion Heart:  Regular rate and rhythm Lungs:  Clear to auscultation bilaterally Back: No paraspinal tenderness Neurological Exam: alert and oriented to person, place, and time.  Speech fluent and not dysarthric, language intact.  CN II-XII intact. Bulk and tone normal, muscle strength 5/5 throughout.  Sensation to light touch intact.  Deep tendon reflexes 2+ throughout, toes  downgoing.  Finger to nose testing intact.  Gait normal, Romberg negative.   [name], DO  CC: [name], MD

## 2021-08-02 ENCOUNTER — Encounter: Payer: Self-pay | Admitting: Gastroenterology

## 2021-08-08 ENCOUNTER — Encounter (INDEPENDENT_AMBULATORY_CARE_PROVIDER_SITE_OTHER): Payer: Medicare Other | Admitting: Ophthalmology

## 2021-08-14 ENCOUNTER — Encounter (INDEPENDENT_AMBULATORY_CARE_PROVIDER_SITE_OTHER): Payer: Self-pay | Admitting: Ophthalmology

## 2021-08-14 ENCOUNTER — Other Ambulatory Visit: Payer: Self-pay

## 2021-08-14 ENCOUNTER — Ambulatory Visit (INDEPENDENT_AMBULATORY_CARE_PROVIDER_SITE_OTHER): Payer: Medicare Other | Admitting: Ophthalmology

## 2021-08-14 DIAGNOSIS — H353112 Nonexudative age-related macular degeneration, right eye, intermediate dry stage: Secondary | ICD-10-CM

## 2021-08-14 DIAGNOSIS — H353221 Exudative age-related macular degeneration, left eye, with active choroidal neovascularization: Secondary | ICD-10-CM | POA: Diagnosis not present

## 2021-08-14 MED ORDER — BEVACIZUMAB CHEMO INJECTION 1.25MG/0.05ML SYRINGE FOR KALEIDOSCOPE
2.5000 mg | INTRAVITREAL | Status: AC | PRN
  Administered 2021-08-14: 2.5 mg via INTRAVITREAL

## 2021-08-14 NOTE — Progress Notes (Signed)
08/14/2021     CHIEF COMPLAINT Patient presents for  Chief Complaint  Patient presents with   Macular Degeneration      HISTORY OF PRESENT ILLNESS: Justin Adams is a 76 y.o. male who presents to the clinic today for:   HPI   4 mos fu ou oct fp. Patient states vision is stable and unchanged since last visit. Denies any new floaters or FOL. Pt states "I see some flashes in my left eye occasionally but I have had them for a while, they are not new."  Last edited by Laurin Coder on 08/14/2021  8:19 AM.      Referring physician: [name], MD [address] [place],  [place] [address]  HISTORICAL INFORMATION:   Selected notes from the MEDICAL RECORD NUMBER    Lab Results  Component Value Date   HGBA1C 5.3 07/11/2017     CURRENT MEDICATIONS: No current outpatient medications on file. (Ophthalmic Drugs)   No current facility-administered medications for this visit. (Ophthalmic Drugs)   Current Outpatient Medications (Other)  Medication Sig   apixaban (ELIQUIS) 5 MG TABS tablet Take 1 tablet (5 mg total) by mouth 2 (two) times daily.   atorvastatin (LIPITOR) 20 MG tablet Take 1 tablet (20 mg total) by mouth daily.   lisinopril (PRINIVIL,ZESTRIL) 20 MG tablet Take 1 tablet (20 mg total) by mouth daily.   polyethylene glycol (MIRALAX / GLYCOLAX) packet Take 17 g by mouth at bedtime.   Wheat Dextrin (BENEFIBER) POWD Take 1 Dose by mouth daily.   No current facility-administered medications for this visit. (Other)      REVIEW OF SYSTEMS: ROS   Negative for: Constitutional, Gastrointestinal, Neurological, Skin, Genitourinary, Musculoskeletal, HENT, Endocrine, Cardiovascular, Eyes, Respiratory, Psychiatric, Allergic/Imm, Heme/Lymph Last edited by Hurman Horn, MD on 08/14/2021  8:50 AM.       ALLERGIES Allergies  Allergen Reactions   Codeine Nausea And Vomiting    PAST MEDICAL HISTORY Past Medical History:  Diagnosis Date    Adenomatous polyp    Bleeding internal hemorrhoids 10/2017   Cerebral venous thrombosis    Grade I diastolic dysfunction 95/02/3266   noted on ECHO    Hemorrhoids    History of kidney stones    Hypercholesteremia    Hypertension    Intracranial hemorrhage (HCC)    Right temporal lobe   LVH (left ventricular hypertrophy) 07/11/2017   Mild, noted on ECHO    Muscular degeneration    Nephrolithiasis    Obesity    OSA (obstructive sleep apnea)    Polycythemia    Prostate cancer ([place]) 2003   Shingles    Stroke ([place]) 06/2017   No residual   Unsteady gait    Ureteral calculi 02/12/2008   Left   Past Surgical History:  Procedure Laterality Date   APPENDECTOMY     childhood   CHOLECYSTECTOMY     COLONOSCOPY     HEMORRHOID BANDING  2015   HOLMIUM LASER APPLICATION Left 05/13/5808   Procedure: HOLMIUM LASER APPLICATION;  Surgeon: Franchot Gallo, MD;  Location: [facility];  Service: Urology;  Laterality: Left;   IR ANGIO EXTERNAL CAROTID SEL EXT CAROTID UNI R MOD SED  07/10/2017   IR ANGIO INTRA EXTRACRAN SEL INTERNAL CAROTID BILAT MOD SED  07/10/2017   IR ANGIO VERTEBRAL SEL VERTEBRAL BILAT MOD SED  07/10/2017   IR URETERAL STENT LEFT NEW ACCESS W/O SEP NEPHROSTOMY CATH  03/12/2018   LEFT HEART CATHETERIZATION WITH CORONARY  ANGIOGRAM N/A 03/15/2013   Procedure: LEFT HEART CATHETERIZATION WITH CORONARY ANGIOGRAM;  Surgeon: Minus Breeding, MD;  Location: [facility] CATH LAB;  Service: Cardiovascular;  Laterality: N/A;   NEPHROLITHOTOMY Left 03/12/2018   Procedure: LEFT NEPHROLITHOTOMY PERCUTANEOUS, STENT PLACEMENT;  Surgeon: Franchot Gallo, MD;  Location: [facility];  Service: Urology;  Laterality: Left;   PROSTATECTOMY     STONE EXTRACTION WITH BASKET      FAMILY HISTORY Family History  Problem Relation Age of Onset   Hypertension Mother    Coronary artery disease Mother 10   Breast cancer Mother    Coronary artery disease Father 5       Died age 75   CAD Brother 47    SOCIAL  HISTORY Social History   Tobacco Use   Smoking status: Never   Smokeless tobacco: Never  Vaping Use   Vaping Use: Never used  Substance Use Topics   Alcohol use: No   Drug use: No         OPHTHALMIC EXAM:  Base Eye Exam     Visual Acuity (ETDRS)       Right Left   Dist cc 20/25 -2 20/400   Dist ph cc  NI    Correction: Glasses         Tonometry (Tonopen, 8:17 AM)       Right Left   Pressure 17 17         Pupils       Dark Light APD   Right 4 3 None   Left 4 3 None         Visual Fields       Left Right    Full Full         Extraocular Movement       Right Left    Full Full         Neuro/Psych     Oriented x3: Yes   Mood/Affect: Normal         Dilation     Both eyes: 1.0% Mydriacyl, 2.5% Phenylephrine @ 8:17 AM           Slit Lamp and Fundus Exam     External Exam       Right Left   External Normal Normal         Slit Lamp Exam       Right Left   Lids/Lashes Normal Normal   Conjunctiva/Sclera White and quiet White and quiet   Cornea Clear Clear   Anterior Chamber Deep and quiet Deep and quiet   Iris Round and reactive Round and reactive   Lens Posterior chamber intraocular lens Posterior chamber intraocular lens   Anterior Vitreous Normal Normal         Fundus Exam       Right Left   Posterior Vitreous Posterior vitreous detachment Posterior vitreous detachment, Central vitreous floaters   Disc Normal Normal   C/D Ratio 0.55 0.45   Macula Intermediate age related macular degeneration, no exudates, no hemorrhage, no membrane  Retinal pigment epithelial mottling, Subretinal fibrosis, Hard drusen, Disciform scar,peripheral scarring , no macular thickening, Geographic atrophy subfoveal approximately 12 DAs size   Vessels Normal Normal   Periphery Normal Normal, attached, 25 d, 90 d            IMAGING AND PROCEDURES  Imaging and Procedures for 08/14/21  OCT, Retina - OU - Both Eyes       Right  Eye Quality was good. Scan  locations included subfoveal. Central Foveal Thickness: 289. Progression has been stable. Findings include retinal drusen , abnormal foveal contour.   Left Eye Quality was good. Scan locations included subfoveal. Central Foveal Thickness: 225. Progression has improved. Findings include subretinal scarring, disciform scar, abnormal foveal contour, retinal drusen .      Color Fundus Photography Optos - OU - Both Eyes       Right Eye Macula : geographic atrophy, drusen, retinal pigment epithelium abnormalities. Vessels : normal observations. Periphery : normal observations.   Left Eye Progression has worsened. Disc findings include normal observations. Macula : geographic atrophy, retinal pigment epithelium abnormalities, hemorrhage.   Notes OS, central geographic atrophy with edges subretinal hemorrhage small in past months now 3 times a size, this and risks enlargement of the central scotoma left eye     Intravitreal Injection, Pharmacologic Agent - OS - Left Eye       Time Out 08/14/2021. 9:02 AM. Confirmed correct patient, procedure, site, and patient consented.   Anesthesia Topical anesthesia was used. Anesthetic medications included Akten 3.5%, Lidocaine 4%.   Procedure Preparation included Ofloxacin , Tobramycin 0.3%, 5% betadine to ocular surface, 10% betadine to eyelids. A 30 gauge needle was used.   Injection: 2.5 mg Bevacizumab 1.'25mg'$ /0.35m   Route: Intravitreal, Site: Left Eye   NDC: [id number]  Post-op Post injection exam found visual acuity of at least counting fingers. The patient tolerated the procedure well. There were no complications. The patient received written and verbal post procedure care education. Post injection medications were not given.              ASSESSMENT/PLAN:  Intermediate stage nonexudative age-related macular degeneration of right eye No sign of CNVM OD today  Exudative age-related macular  degeneration of left eye with active choroidal neovascularization (HCC) OS, area of activity in the temporal edge of the geographic atrophy risking enlargement over time due to subretinal hemorrhage and extension as patient is on blood thinners.  After discussion our plan will be to deliver chronic suppression of the neovascular tissue perhaps on a quarterly basis reevaluating determine if further therapy is required     ICD-10-CM   1. Exudative age-related macular degeneration of left eye with active choroidal neovascularization (HCC)  H35.3221 OCT, Retina - OU - Both Eyes    Color Fundus Photography Optos - OU - Both Eyes    Intravitreal Injection, Pharmacologic Agent - OS - Left Eye    Bevacizumab (AVASTIN) SOLN 2.5 mg    2. Intermediate stage nonexudative age-related macular degeneration of right eye  H35.3112 OCT, Retina - OU - Both Eyes    Color Fundus Photography Optos - OU - Both Eyes      1.  OD stable, no signs of active disease  2.  OS, with central geographic atrophy affecting acuity and edge of lesion with CNVM and SR hemorrhage.  We will commence therapy today with intravitreal Avastin  3.  Ophthalmic Meds Ordered this visit:  Meds ordered this encounter  Medications   Bevacizumab (AVASTIN) SOLN 2.5 mg       Return in about 3 months (around 11/14/2021) for DILATE OU, AVASTIN OCT, OS.  There are no Patient Instructions on file for this visit.   Explained the diagnoses, plan, and follow up with the patient and they expressed understanding.  Patient expressed understanding of the importance of proper follow up care.   GClent DemarkRankin M.D. Diseases & Surgery of the Retina and Vitreous [facility]  [place] 08/14/21     Abbreviations: M myopia (nearsighted); A astigmatism; H hyperopia (farsighted); P presbyopia; Mrx spectacle prescription;  CTL contact lenses; OD right eye; OS left eye; OU both eyes  XT exotropia; ET esotropia; PEK punctate epithelial  keratitis; PEE punctate epithelial erosions; DES dry eye syndrome; MGD meibomian gland dysfunction; ATs artificial tears; PFAT's preservative free artificial tears; [place] nuclear sclerotic cataract; PSC posterior subcapsular cataract; ERM epi-retinal membrane; PVD posterior vitreous detachment; RD retinal detachment; DM diabetes mellitus; DR diabetic retinopathy; NPDR non-proliferative diabetic retinopathy; PDR proliferative diabetic retinopathy; CSME clinically significant macular edema; DME diabetic macular edema; dbh dot blot hemorrhages; CWS cotton wool spot; POAG primary open angle glaucoma; C/D cup-to-disc ratio; HVF humphrey visual field; GVF goldmann visual field; OCT optical coherence tomography; IOP intraocular pressure; BRVO Branch retinal vein occlusion; CRVO central retinal vein occlusion; CRAO central retinal artery occlusion; BRAO branch retinal artery occlusion; RT retinal tear; SB scleral buckle; PPV pars plana vitrectomy; VH Vitreous hemorrhage; PRP panretinal laser photocoagulation; IVK intravitreal kenalog; VMT vitreomacular traction; MH Macular hole;  NVD neovascularization of the disc; NVE neovascularization elsewhere; AREDS age related eye disease study; ARMD age related macular degeneration; POAG primary open angle glaucoma; EBMD epithelial/anterior basement membrane dystrophy; ACIOL anterior chamber intraocular lens; IOL intraocular lens; PCIOL posterior chamber intraocular lens; Phaco/IOL phacoemulsification with intraocular lens placement; [place] photorefractive keratectomy; LASIK laser assisted in situ keratomileusis; HTN hypertension; DM diabetes mellitus; COPD chronic obstructive pulmonary disease

## 2021-08-14 NOTE — Assessment & Plan Note (Signed)
No sign of CNVM OD today °

## 2021-08-14 NOTE — Assessment & Plan Note (Signed)
OS, area of activity in the temporal edge of the geographic atrophy risking enlargement over time due to subretinal hemorrhage and extension as patient is on blood thinners. ? ?After discussion our plan will be to deliver chronic suppression of the neovascular tissue perhaps on a quarterly basis reevaluating determine if further therapy is required ?

## 2021-08-21 DIAGNOSIS — I700 Atherosclerosis of aorta: Secondary | ICD-10-CM | POA: Diagnosis not present

## 2021-08-21 DIAGNOSIS — E7800 Pure hypercholesterolemia, unspecified: Secondary | ICD-10-CM | POA: Diagnosis not present

## 2021-08-21 DIAGNOSIS — I10 Essential (primary) hypertension: Secondary | ICD-10-CM | POA: Diagnosis not present

## 2021-08-30 ENCOUNTER — Telehealth: Payer: Self-pay

## 2021-08-30 NOTE — Telephone Encounter (Signed)
Patient is scheduled to see you on 09/10/21. Patient previously saw Dr. Earlean Shawl in 2019. Some notes in Epic. Please review and decide if you want to accept patient.  ?

## 2021-08-30 NOTE — Telephone Encounter (Signed)
Former patient of [facility] who changed gastroenterologists to Dr. Earlean Shawl. Request received to transfer GI care from outside practice to [facility].  We appreciate the interest in our practice, however at this time due to capacity we are unable to accommodate a transfer of care.   Ability to accommodate future transfer requests may change over time and the patient can contact us again in 6-12 months if still interested in being seen at [facility].      ?

## 2021-08-31 NOTE — Telephone Encounter (Signed)
Per Dr. Fuller Plan, 4/3 appointment for patient has been cancelled.  Patient was contacted, situation explained, and voiced understanding. ?

## 2021-09-07 ENCOUNTER — Ambulatory Visit: Payer: Medicare Other | Admitting: Neurology

## 2021-09-10 ENCOUNTER — Ambulatory Visit: Payer: Medicare Other | Admitting: Gastroenterology

## 2021-09-25 ENCOUNTER — Other Ambulatory Visit: Payer: Self-pay | Admitting: Neurology

## 2021-09-25 ENCOUNTER — Telehealth: Payer: Self-pay | Admitting: Neurology

## 2021-09-25 MED ORDER — GABAPENTIN 100 MG PO CAPS: ORAL_CAPSULE | ORAL | 0 refills | Status: DC

## 2021-09-25 NOTE — Telephone Encounter (Signed)
Patient advised: ?OK to start gabapentin '100mg'$  capsule:  ?Take '100mg'$  twice daily for one week  ?Then '200mg'$  twice daily for one week  ?Then '300mg'$  twice daily. ? ?Mychart sent with instructions as well.  ?

## 2021-09-25 NOTE — Telephone Encounter (Signed)
Patient would like to see if he can get a prescription of gabapentin to try. The medicine jaffe told him to use isn't helping him sleep. Still wakes up with HA.  ?

## 2021-09-25 NOTE — Telephone Encounter (Signed)
Per Last ov notes 07/2021  ?1.advised to increase melatonin to 9 to '12mg'$  at bedtime.  If ineffective, would try gabapentin.  As he is already on Eliquis, would rather not start indomethacin. ? ?Will ask Dr.Jaffe to be sure and what dosing to send.  ?

## 2021-09-26 ENCOUNTER — Telehealth: Payer: Self-pay | Admitting: Neurology

## 2021-09-26 NOTE — Telephone Encounter (Signed)
Patient needs his chart updated with his new pharmacy: [facility] [address] [facility] [address] in [place]. ? ?He had some refills sent to his old pharmacy yesterday by mistake but will try to get it transferred himself. ? ?Patient will call back for help if needed. ?

## 2021-09-27 NOTE — Telephone Encounter (Signed)
LMOVM for pt, Just wanted to make sure patient got he medications.  ?

## 2021-09-27 NOTE — Telephone Encounter (Signed)
Patient called to let sheena know, he did get the medicine that was prescribed. He picked it up yesterday ?

## 2021-10-26 ENCOUNTER — Ambulatory Visit
Admission: RE | Admit: 2021-10-26 | Discharge: 2021-10-26 | Disposition: A | Discharge status: Home / Self Care | Payer: Medicare Other | Source: Ambulatory Visit | Attending: Gastroenterology | Admitting: Gastroenterology

## 2021-10-26 ENCOUNTER — Other Ambulatory Visit: Payer: Self-pay | Admitting: Gastroenterology

## 2021-10-26 DIAGNOSIS — K5900 Constipation, unspecified: Secondary | ICD-10-CM

## 2021-10-26 DIAGNOSIS — Z86010 Personal history of colonic polyps: Secondary | ICD-10-CM | POA: Diagnosis not present

## 2021-10-26 DIAGNOSIS — R159 Full incontinence of feces: Secondary | ICD-10-CM | POA: Diagnosis not present

## 2021-11-06 ENCOUNTER — Other Ambulatory Visit: Payer: Self-pay | Admitting: Neurology

## 2021-11-06 MED ORDER — GABAPENTIN 300 MG PO CAPS: 300.0000 mg | ORAL_CAPSULE | Freq: Two times a day (BID) | ORAL | 0 refills | Status: DC

## 2021-11-12 ENCOUNTER — Ambulatory Visit (INDEPENDENT_AMBULATORY_CARE_PROVIDER_SITE_OTHER): Payer: Medicare Other | Admitting: Podiatry

## 2021-11-12 DIAGNOSIS — M79675 Pain in left toe(s): Secondary | ICD-10-CM | POA: Diagnosis not present

## 2021-11-12 DIAGNOSIS — M79674 Pain in right toe(s): Secondary | ICD-10-CM

## 2021-11-12 DIAGNOSIS — B351 Tinea unguium: Secondary | ICD-10-CM

## 2021-11-12 NOTE — Progress Notes (Signed)
   SUBJECTIVE Patient presents to office today complaining of elongated, thickened nails that cause pain while ambulating in shoes.  He is unable to trim his own nails. Patient is here for further evaluation and treatment.  Past Medical History:  Diagnosis Date   Adenomatous polyp    Bleeding internal hemorrhoids 10/2017   Cerebral venous thrombosis    Grade I diastolic dysfunction 07/11/2017   noted on ECHO    Hemorrhoids    History of kidney stones    Hypercholesteremia    Hypertension    Intracranial hemorrhage (HCC)    Right temporal lobe   LVH (left ventricular hypertrophy) 07/11/2017   Mild, noted on ECHO    Muscular degeneration    Nephrolithiasis    Obesity    OSA (obstructive sleep apnea)    Polycythemia    Prostate cancer (HCC) 2003   Shingles    Stroke (HCC) 06/2017   No residual   Unsteady gait    Ureteral calculi 02/12/2008   Left    OBJECTIVE General Patient is awake, alert, and oriented x 3 and in no acute distress. Derm Skin is dry and supple bilateral. Negative open lesions or macerations. Remaining integument unremarkable. Nails are tender, long, thickened and dystrophic with subungual debris, consistent with onychomycosis, 1-5 bilateral. No signs of infection noted. Vasc  DP and PT pedal pulses palpable bilaterally. Temperature gradient within normal limits.  Neuro Epicritic and protective threshold sensation grossly intact bilaterally.  Musculoskeletal Exam No symptomatic pedal deformities noted bilateral. Muscular strength within normal limits.  ASSESSMENT 1. Onychodystrophic nails 1-5 bilateral with hyperkeratosis of nails.  2. Onychomycosis of nail due to dermatophyte bilateral 3. Pain in foot bilateral  PLAN OF CARE 1. Patient evaluated today.  2. Instructed to maintain good pedal hygiene and foot care.  3. Mechanical debridement of nails 1-5 bilaterally performed using a nail nipper. Filed with dremel without incident.  4. Return to clinic in  3 mos.    Vanissa Strength M. Praise Stennett, DPM [facility]  Dr. Mandeep Ferch M. Nikala Walsworth, DPM    [address]                                        [place], [place] [address]                Office [phone]  Fax [phone]   

## 2021-11-14 ENCOUNTER — Encounter (INDEPENDENT_AMBULATORY_CARE_PROVIDER_SITE_OTHER): Payer: Self-pay | Admitting: Ophthalmology

## 2021-11-14 ENCOUNTER — Ambulatory Visit (INDEPENDENT_AMBULATORY_CARE_PROVIDER_SITE_OTHER): Payer: Medicare Other | Admitting: Ophthalmology

## 2021-11-14 DIAGNOSIS — H353221 Exudative age-related macular degeneration, left eye, with active choroidal neovascularization: Secondary | ICD-10-CM | POA: Diagnosis not present

## 2021-11-14 DIAGNOSIS — H353112 Nonexudative age-related macular degeneration, right eye, intermediate dry stage: Secondary | ICD-10-CM

## 2021-11-14 MED ORDER — BEVACIZUMAB 2.5 MG/0.1ML IZ SOSY
2.5000 mg | PREFILLED_SYRINGE | INTRAVITREAL | Status: AC | PRN
  Administered 2021-11-14: 2.5 mg via INTRAVITREAL

## 2021-11-14 NOTE — Assessment & Plan Note (Signed)
No sign of CNVM OD 

## 2021-11-14 NOTE — Progress Notes (Signed)
11/14/2021     CHIEF COMPLAINT Patient presents for  Chief Complaint  Patient presents with   Macular Degeneration      HISTORY OF PRESENT ILLNESS: Justin Adams is a 76 y.o. male who presents to the clinic today for:   HPI   3 MOS for DILATE OU, AVASTIN OCT. Pt denies new floaters and FOL. Pt states vision has been stable.  Last edited by Silvestre Moment on 11/14/2021  7:59 AM.      Referring physician: [name], MD [address] [place],  [place] [address]  HISTORICAL INFORMATION:   Selected notes from the MEDICAL RECORD NUMBER    Lab Results  Component Value Date   HGBA1C 5.3 07/11/2017     CURRENT MEDICATIONS: No current outpatient medications on file. (Ophthalmic Drugs)   No current facility-administered medications for this visit. (Ophthalmic Drugs)   Current Outpatient Medications (Other)  Medication Sig   apixaban (ELIQUIS) 5 MG TABS tablet Take 1 tablet (5 mg total) by mouth 2 (two) times daily.   atorvastatin (LIPITOR) 20 MG tablet Take 1 tablet (20 mg total) by mouth daily.   gabapentin (NEURONTIN) 300 MG capsule Take 1 capsule (300 mg total) by mouth 2 (two) times daily.   lisinopril (PRINIVIL,ZESTRIL) 20 MG tablet Take 1 tablet (20 mg total) by mouth daily.   polyethylene glycol (MIRALAX / GLYCOLAX) packet Take 17 g by mouth at bedtime.   Wheat Dextrin (BENEFIBER) POWD Take 1 Dose by mouth daily.   No current facility-administered medications for this visit. (Other)      REVIEW OF SYSTEMS: ROS   Negative for: Constitutional, Gastrointestinal, Neurological, Skin, Genitourinary, Musculoskeletal, HENT, Endocrine, Cardiovascular, Eyes, Respiratory, Psychiatric, Allergic/Imm, Heme/Lymph Last edited by Silvestre Moment on 11/14/2021  7:59 AM.       ALLERGIES Allergies  Allergen Reactions   Codeine Nausea And Vomiting    PAST MEDICAL HISTORY Past Medical History:  Diagnosis Date   Adenomatous polyp    Bleeding internal hemorrhoids  10/2017   Cerebral venous thrombosis    Grade I diastolic dysfunction 60/45/4098   noted on ECHO    Hemorrhoids    History of kidney stones    Hypercholesteremia    Hypertension    Intracranial hemorrhage (HCC)    Right temporal lobe   LVH (left ventricular hypertrophy) 07/11/2017   Mild, noted on ECHO    Muscular degeneration    Nephrolithiasis    Obesity    OSA (obstructive sleep apnea)    Polycythemia    Prostate cancer ([place]) 2003   Shingles    Stroke ([place]) 06/2017   No residual   Unsteady gait    Ureteral calculi 02/12/2008   Left   Past Surgical History:  Procedure Laterality Date   APPENDECTOMY     childhood   CHOLECYSTECTOMY     COLONOSCOPY     HEMORRHOID BANDING  2015   HOLMIUM LASER APPLICATION Left 04/18/1477   Procedure: HOLMIUM LASER APPLICATION;  Surgeon: Franchot Gallo, MD;  Location: [facility];  Service: Urology;  Laterality: Left;   IR ANGIO EXTERNAL CAROTID SEL EXT CAROTID UNI R MOD SED  07/10/2017   IR ANGIO INTRA EXTRACRAN SEL INTERNAL CAROTID BILAT MOD SED  07/10/2017   IR ANGIO VERTEBRAL SEL VERTEBRAL BILAT MOD SED  07/10/2017   IR URETERAL STENT LEFT NEW ACCESS W/O SEP NEPHROSTOMY CATH  03/12/2018   LEFT HEART CATHETERIZATION WITH CORONARY ANGIOGRAM N/A 03/15/2013   Procedure: LEFT HEART CATHETERIZATION WITH  CORONARY ANGIOGRAM;  Surgeon: Minus Breeding, MD;  Location: [facility] CATH LAB;  Service: Cardiovascular;  Laterality: N/A;   NEPHROLITHOTOMY Left 03/12/2018   Procedure: LEFT NEPHROLITHOTOMY PERCUTANEOUS, STENT PLACEMENT;  Surgeon: Franchot Gallo, MD;  Location: [facility];  Service: Urology;  Laterality: Left;   PROSTATECTOMY     STONE EXTRACTION WITH BASKET      FAMILY HISTORY Family History  Problem Relation Age of Onset   Hypertension Mother    Coronary artery disease Mother 31   Breast cancer Mother    Coronary artery disease Father 46       Died age 71   CAD Brother 49    SOCIAL HISTORY Social History   Tobacco Use   Smoking status:  Never   Smokeless tobacco: Never  Vaping Use   Vaping Use: Never used  Substance Use Topics   Alcohol use: No   Drug use: No         OPHTHALMIC EXAM:  Base Eye Exam     Visual Acuity (ETDRS)       Right Left   Dist cc 20/25 -2 20/400   Dist ph cc  NI    Correction: Glasses         Tonometry (Tonopen, 8:04 AM)       Right Left   Pressure 22 19         Pupils       Pupils APD   Right PERRL None   Left PERRL None         Visual Fields       Left Right    Full Full         Extraocular Movement       Right Left    Full Full         Neuro/Psych     Oriented x3: Yes   Mood/Affect: Normal         Dilation     Both eyes: 1.0% Mydriacyl, 2.5% Phenylephrine @ 8:03 AM           Slit Lamp and Fundus Exam     External Exam       Right Left   External Normal Normal         Slit Lamp Exam       Right Left   Lids/Lashes Normal Normal   Conjunctiva/Sclera White and quiet White and quiet   Cornea Clear Clear   Anterior Chamber Deep and quiet Deep and quiet   Iris Round and reactive Round and reactive   Lens Posterior chamber intraocular lens Posterior chamber intraocular lens   Anterior Vitreous Normal Normal         Fundus Exam       Right Left   Posterior Vitreous  Posterior vitreous detachment, Central vitreous floaters   Disc  Normal   C/D Ratio  0.45   Macula   Retinal pigment epithelial mottling, Subretinal fibrosis, Hard drusen, Disciform scar,peripheral scarring , no macular thickening, Geographic atrophy subfoveal approximately 12 DAs size   Vessels  Normal   Periphery  Normal, attached, 25 d, 90 d            IMAGING AND PROCEDURES  Imaging and Procedures for 11/14/21  OCT, Retina - OU - Both Eyes       Right Eye Quality was good. Scan locations included subfoveal. Central Foveal Thickness: 284. Progression has been stable. Findings include retinal drusen , abnormal foveal contour.   Left Eye Quality  was  good. Scan locations included subfoveal. Central Foveal Thickness: 285. Progression has improved. Findings include subretinal scarring, disciform scar, abnormal foveal contour, retinal drusen .   Notes OS, no sign of active CNVM perifoveal.  Subfoveal disciform scar remained stable.  Current interval of examination is at 15-month    Intravitreal Injection, Pharmacologic Agent - OS - Left Eye       Time Out 11/14/2021. 8:40 AM. Confirmed correct patient, procedure, site, and patient consented.   Anesthesia Topical anesthesia was used. Anesthetic medications included Lidocaine 4%.   Procedure Preparation included Ofloxacin , Tobramycin 0.3%, 5% betadine to ocular surface, 10% betadine to eyelids. A 30 gauge needle was used.   Injection: 2.5 mg bevacizumab 2.5 MG/0.1ML   Route: Intravitreal, Site: Left Eye   NDC: [id number]  Post-op Post injection exam found visual acuity of at least counting fingers. The patient tolerated the procedure well. There were no complications. The patient received written and verbal post procedure care education. Post injection medications included ocuflox.              ASSESSMENT/PLAN:  Exudative age-related macular degeneration of left eye with active choroidal neovascularization (HCC) Chronic subfoveal disease  CNVM with history of disciform scar centrally active on the edges with threatening enlargement of scotoma.  Currently at 379-monthollow-up.  Repeat Avastin today to maintain.  Extend interval examination next to 15 weeks  Intermediate stage nonexudative age-related macular degeneration of right eye No sign of CNVM OD     ICD-10-CM   1. Exudative age-related macular degeneration of left eye with active choroidal neovascularization (HCC)  H35.3221 OCT, Retina - OU - Both Eyes    Intravitreal Injection, Pharmacologic Agent - OS - Left Eye    bevacizumab (AVASTIN) SOSY 2.5 mg    2. Intermediate stage nonexudative age-related macular  degeneration of right eye  H35.3112       1.  OD doing well by OCT no sign of CNVM  2.  OS stable acuity no enlargement of scotoma symptomatically, no sign of CNVM enlargement by OCT or clinical examination.  Today at 3-35-monthllow-up repeat Avastin for maintenance dosing.  3.  Stand interval examination next to 15 weeks dilate OU no planned injection OS next  Ophthalmic Meds Ordered this visit:  Meds ordered this encounter  Medications   bevacizumab (AVASTIN) SOSY 2.5 mg       Return in about 14 weeks (around 02/20/2022) for DILATE OU, COLOR FP, OCT, no planned injection.  There are no Patient Instructions on file for this visit.   Explained the diagnoses, plan, and follow up with the patient and they expressed understanding.  Patient expressed understanding of the importance of proper follow up care.   GarClent Demarknkin M.D. Diseases & Surgery of the Retina and Vitreous [facility]/07/23     Abbreviations: M myopia (nearsighted); A astigmatism; H hyperopia (farsighted); P presbyopia; Mrx spectacle prescription;  CTL contact lenses; OD right eye; OS left eye; OU both eyes  XT exotropia; ET esotropia; PEK punctate epithelial keratitis; PEE punctate epithelial erosions; DES dry eye syndrome; MGD meibomian gland dysfunction; ATs artificial tears; PFAT's preservative free artificial tears; NSCEpworthclear sclerotic cataract; PSC posterior subcapsular cataract; ERM epi-retinal membrane; PVD posterior vitreous detachment; RD retinal detachment; DM diabetes mellitus; DR diabetic retinopathy; NPDR non-proliferative diabetic retinopathy; PDR proliferative diabetic retinopathy; CSME clinically significant macular edema; DME diabetic macular edema; dbh dot blot hemorrhages; CWS cotton wool spot; POAG primary open angle glaucoma;  C/D cup-to-disc ratio; HVF humphrey visual field; GVF goldmann visual field; OCT optical coherence tomography; IOP intraocular pressure; BRVO Branch  retinal vein occlusion; CRVO central retinal vein occlusion; CRAO central retinal artery occlusion; BRAO branch retinal artery occlusion; RT retinal tear; SB scleral buckle; PPV pars plana vitrectomy; VH Vitreous hemorrhage; PRP panretinal laser photocoagulation; IVK intravitreal kenalog; VMT vitreomacular traction; MH Macular hole;  NVD neovascularization of the disc; NVE neovascularization elsewhere; AREDS age related eye disease study; ARMD age related macular degeneration; POAG primary open angle glaucoma; EBMD epithelial/anterior basement membrane dystrophy; ACIOL anterior chamber intraocular lens; IOL intraocular lens; PCIOL posterior chamber intraocular lens; Phaco/IOL phacoemulsification with intraocular lens placement; [place] photorefractive keratectomy; LASIK laser assisted in situ keratomileusis; HTN hypertension; DM diabetes mellitus; COPD chronic obstructive pulmonary disease

## 2021-11-14 NOTE — Assessment & Plan Note (Signed)
Chronic subfoveal disease  CNVM with history of disciform scar centrally active on the edges with threatening enlargement of scotoma.  Currently at 80-monthfollow-up.  Repeat Avastin today to maintain.  Extend interval examination next to 15 weeks

## 2021-11-23 DIAGNOSIS — Z961 Presence of intraocular lens: Secondary | ICD-10-CM | POA: Diagnosis not present

## 2021-11-23 DIAGNOSIS — H52201 Unspecified astigmatism, right eye: Secondary | ICD-10-CM | POA: Diagnosis not present

## 2021-11-23 DIAGNOSIS — H04123 Dry eye syndrome of bilateral lacrimal glands: Secondary | ICD-10-CM | POA: Diagnosis not present

## 2021-11-23 DIAGNOSIS — H353221 Exudative age-related macular degeneration, left eye, with active choroidal neovascularization: Secondary | ICD-10-CM | POA: Diagnosis not present

## 2021-12-06 NOTE — Progress Notes (Signed)
NEUROLOGY FOLLOW UP OFFICE NOTE  Justin Adams [id number]  Assessment/Plan:   Primary stabbing headahce History of cerebral venous thrombosis with right temporal intracerebral hemorrhage.   1  Gabapentin '300mg'$  - prescribed for twice daily but he may take once daily if that is effective. 2  Follow up 1 year   Subjective:  Justin Adams is a 76 year old right-handed male with hypertension, hypercholesterolemia, and OSA, history of prostate cancer, and history of cerebral hemorrhage secondary to cerebral venous thrombosis who follows up for headache.   UPDATE: Medications include:  Gabapentin '300mg'$  daily, Eliquis, lisinopril '20mg'$ , atorvastatin '20mg'$    Last visit advised to increase melatonin to 9 to '12mg'$  at bedtime.  Still with headache.  Started gabapentin in April.  He has only taken one a day and hasn't had   HISTORY: He was admitted to [facility] on 07/09/17 for right frontal headache and nausea.  MRI and MRV of head showed right temporal intracerebral hemorrhage with thromboses of the right transverse sinus, sigmoid sinus, and right jugular vein, which was confirmed on diagnostic angiogram.  MRA of the head showed no intracranial arterial abnormality such as aneurysm.  CT of the chest abdomen and pelvis revealed no malignancy.  2D echocardiogram revealed ejection fraction of 55%.  DRVVT borderline elevated at 49.7 but otherwise hypercoagulable panel was negative.  LDL was 103 and hemoglobin A1c was 5.3.  He has history of polycythemia with Hgb level 17.3-17.5 going back several years.  Hgb on day of admission was 17.5.  Subsequent Hgb levels have been around 16.  He was started on IV heparin and was subsequently transitioned to Eliquis.  He was also discharged on Lipitor 20 mg daily.   He later had a CTV on 09/05/17 which demonstrated interval resolution of the right temporal lobe intraparenchymal hemorrhage with residual encephalomalacia and interval improvement/parital  recanalization of right-sided dural venous thrombosis with persistent nonocclusive thrombus within the right transverse and sigmoid sinuses and proximal right internal jugular vein.   In November 2021, he began experiencing new mild to moderate paroxysmal jabs in the right frontal region every 5 to 20 minutes.  No severe headache such as how he presented for the cerebral hemorrhage and thrombosis.  No dizziness, sensory deficits, speech deficits, or focal weakness.  He has macular degeneration but no new visual disturbance. He has tried Tylenol and Advil which have been ineffective.  He has no prior history of headaches and this headache is in the same area when he had the [place].  CT/CTA of head on 06/16/2020 personally reviewed showed sequelae of prior parenchymal hemorrhage and persistent partial thrombosis of right transverse and sigmoid sinuses and persistent multifocal nonocculsive thrombus within the superior sagittal sinus but no acute or new intracranial abnormalities.  Headaches resolved after a couple of months.  PAST MEDICAL HISTORY: Past Medical History:  Diagnosis Date   Adenomatous polyp    Bleeding internal hemorrhoids 10/2017   Cerebral venous thrombosis    Grade I diastolic dysfunction 10/62/6948   noted on ECHO    Hemorrhoids    History of kidney stones    Hypercholesteremia    Hypertension    Intracranial hemorrhage (HCC)    Right temporal lobe   LVH (left ventricular hypertrophy) 07/11/2017   Mild, noted on ECHO    Muscular degeneration    Nephrolithiasis    Obesity    OSA (obstructive sleep apnea)    Polycythemia    Prostate cancer ([place]) 2003   Shingles  Stroke ([place]) 06/2017   No residual   Unsteady gait    Ureteral calculi 02/12/2008   Left    MEDICATIONS: Current Outpatient Medications on File Prior to Visit  Medication Sig Dispense Refill   apixaban (ELIQUIS) 5 MG TABS tablet Take 1 tablet (5 mg total) by mouth 2 (two) times daily. 180 tablet 0    atorvastatin (LIPITOR) 20 MG tablet Take 1 tablet (20 mg total) by mouth daily. 90 tablet 0   gabapentin (NEURONTIN) 300 MG capsule Take 1 capsule (300 mg total) by mouth 2 (two) times daily. 60 capsule 0   lisinopril (PRINIVIL,ZESTRIL) 20 MG tablet Take 1 tablet (20 mg total) by mouth daily. 30 tablet 1   polyethylene glycol (MIRALAX / GLYCOLAX) packet Take 17 g by mouth at bedtime.     Wheat Dextrin (BENEFIBER) POWD Take 1 Dose by mouth daily.     No current facility-administered medications on file prior to visit.    ALLERGIES: Allergies  Allergen Reactions   Codeine Nausea And Vomiting    FAMILY HISTORY: Family History  Problem Relation Age of Onset   Hypertension Mother    Coronary artery disease Mother 28   Breast cancer Mother    Coronary artery disease Father 58       Died age 48   CAD Brother 40      Objective:  Blood pressure (!) 108/57, pulse 78, height '6\' 2"'$  (1.88 m), weight 235 lb 12.8 oz (107 kg), SpO2 95 %. General: No acute distress.  Patient appears well-groomed.    Justin Clines, DO  CC: Justin Manes, MD

## 2021-12-07 ENCOUNTER — Ambulatory Visit (INDEPENDENT_AMBULATORY_CARE_PROVIDER_SITE_OTHER): Payer: Medicare Other | Admitting: Neurology

## 2021-12-07 ENCOUNTER — Encounter: Payer: Self-pay | Admitting: Neurology

## 2021-12-07 VITALS — BP 108/57 | HR 78 | Ht 74.0 in | Wt 235.8 lb

## 2021-12-07 DIAGNOSIS — G4485 Primary stabbing headache: Secondary | ICD-10-CM | POA: Diagnosis not present

## 2021-12-07 MED ORDER — GABAPENTIN 300 MG PO CAPS
300.0000 mg | ORAL_CAPSULE | Freq: Two times a day (BID) | ORAL | 11 refills | Status: DC
Start: 2021-12-07 — End: 2023-08-27

## 2021-12-07 NOTE — Patient Instructions (Signed)
Continue gabapentin '300mg'$  once daily

## 2022-02-13 ENCOUNTER — Ambulatory Visit (INDEPENDENT_AMBULATORY_CARE_PROVIDER_SITE_OTHER): Payer: Medicare Other | Admitting: Podiatry

## 2022-02-13 DIAGNOSIS — M79675 Pain in left toe(s): Secondary | ICD-10-CM

## 2022-02-13 DIAGNOSIS — B351 Tinea unguium: Secondary | ICD-10-CM | POA: Diagnosis not present

## 2022-02-13 DIAGNOSIS — M79674 Pain in right toe(s): Secondary | ICD-10-CM | POA: Diagnosis not present

## 2022-02-13 NOTE — Progress Notes (Signed)
   SUBJECTIVE Patient presents to office today complaining of elongated, thickened nails that cause pain while ambulating in shoes.  He is unable to trim his own nails. Patient is here for further evaluation and treatment.  Past Medical History:  Diagnosis Date   Adenomatous polyp    Bleeding internal hemorrhoids 10/2017   Cerebral venous thrombosis    Grade I diastolic dysfunction 07/11/2017   noted on ECHO    Hemorrhoids    History of kidney stones    Hypercholesteremia    Hypertension    Intracranial hemorrhage (HCC)    Right temporal lobe   LVH (left ventricular hypertrophy) 07/11/2017   Mild, noted on ECHO    Muscular degeneration    Nephrolithiasis    Obesity    OSA (obstructive sleep apnea)    Polycythemia    Prostate cancer (HCC) 2003   Shingles    Stroke (HCC) 06/2017   No residual   Unsteady gait    Ureteral calculi 02/12/2008   Left    OBJECTIVE General Patient is awake, alert, and oriented x 3 and in no acute distress. Derm Skin is dry and supple bilateral. Negative open lesions or macerations. Remaining integument unremarkable. Nails are tender, long, thickened and dystrophic with subungual debris, consistent with onychomycosis, 1-5 bilateral. No signs of infection noted. Vasc  DP and PT pedal pulses palpable bilaterally. Temperature gradient within normal limits.  Neuro Epicritic and protective threshold sensation grossly intact bilaterally.  Musculoskeletal Exam No symptomatic pedal deformities noted bilateral. Muscular strength within normal limits.  ASSESSMENT 1. Onychodystrophic nails 1-5 bilateral with hyperkeratosis of nails.  2. Onychomycosis of nail due to dermatophyte bilateral 3. Pain in foot bilateral  PLAN OF CARE 1. Patient evaluated today.  2. Instructed to maintain good pedal hygiene and foot care.  3. Mechanical debridement of nails 1-5 bilaterally performed using a nail nipper. Filed with dremel without incident.  4. Return to clinic in  3 mos.    Ikeya Brockel M. Blanche Gallien, DPM [facility]  Dr. Bren Borys M. Clova Morlock, DPM    [address]                                        [place], [place] [address]                Office [phone]  Fax [phone]   

## 2022-02-20 ENCOUNTER — Encounter (INDEPENDENT_AMBULATORY_CARE_PROVIDER_SITE_OTHER): Payer: Self-pay | Admitting: Ophthalmology

## 2022-02-20 ENCOUNTER — Ambulatory Visit (INDEPENDENT_AMBULATORY_CARE_PROVIDER_SITE_OTHER): Payer: Medicare Other | Admitting: Ophthalmology

## 2022-02-20 DIAGNOSIS — H353221 Exudative age-related macular degeneration, left eye, with active choroidal neovascularization: Secondary | ICD-10-CM | POA: Diagnosis not present

## 2022-02-20 DIAGNOSIS — H353112 Nonexudative age-related macular degeneration, right eye, intermediate dry stage: Secondary | ICD-10-CM | POA: Diagnosis not present

## 2022-02-20 DIAGNOSIS — H43811 Vitreous degeneration, right eye: Secondary | ICD-10-CM | POA: Diagnosis not present

## 2022-02-20 DIAGNOSIS — H43812 Vitreous degeneration, left eye: Secondary | ICD-10-CM | POA: Diagnosis not present

## 2022-02-20 NOTE — Assessment & Plan Note (Signed)
Physiologic no holes or tears 

## 2022-02-20 NOTE — Progress Notes (Signed)
02/20/2022     CHIEF COMPLAINT Patient presents for  Chief Complaint  Patient presents with   Macular Degeneration      HISTORY OF PRESENT ILLNESS: Justin Adams is a 76 y.o. male who presents to the clinic today for:   HPI   Macular degeneration left eye history of wet AMD with disciform scarring centrally OS.  Currently at interval follow-up of 3 months left eye 14 weeks dilate ou color fp oct no planned injection Pt states his vision has been stable Pt denies any new floaters or FOL Pt complains of difficulty with reading Last edited by Hurman Horn, MD on 02/20/2022  9:19 AM.      Referring physician: [name], MD [address] [place],   [address]  HISTORICAL INFORMATION:   Selected notes from the MEDICAL RECORD NUMBER    Lab Results  Component Value Date   HGBA1C 5.3 07/11/2017     CURRENT MEDICATIONS: No current outpatient medications on file. (Ophthalmic Drugs)   No current facility-administered medications for this visit. (Ophthalmic Drugs)   Current Outpatient Medications (Other)  Medication Sig   apixaban (ELIQUIS) 5 MG TABS tablet Take 1 tablet (5 mg total) by mouth 2 (two) times daily.   atorvastatin (LIPITOR) 20 MG tablet Take 1 tablet (20 mg total) by mouth daily.   gabapentin (NEURONTIN) 300 MG capsule Take 1 capsule (300 mg total) by mouth 2 (two) times daily.   lisinopril (PRINIVIL,ZESTRIL) 20 MG tablet Take 1 tablet (20 mg total) by mouth daily.   polyethylene glycol (MIRALAX / GLYCOLAX) packet Take 17 g by mouth at bedtime.   No current facility-administered medications for this visit. (Other)      REVIEW OF SYSTEMS: ROS   Negative for: Constitutional, Gastrointestinal, Neurological, Skin, Genitourinary, Musculoskeletal, HENT, Endocrine, Cardiovascular, Eyes, Respiratory, Psychiatric, Allergic/Imm, Heme/Lymph Last edited by Orene Desanctis D, CMA on 02/20/2022  8:26 AM.       ALLERGIES Allergies   Allergen Reactions   Codeine Nausea And Vomiting    PAST MEDICAL HISTORY Past Medical History:  Diagnosis Date   Adenomatous polyp    Bleeding internal hemorrhoids 10/2017   Cerebral venous thrombosis    Grade I diastolic dysfunction 57/06/7791   noted on ECHO    Hemorrhoids    History of kidney stones    Hypercholesteremia    Hypertension    Intracranial hemorrhage (HCC)    Right temporal lobe   LVH (left ventricular hypertrophy) 07/11/2017   Mild, noted on ECHO    Muscular degeneration    Nephrolithiasis    Obesity    OSA (obstructive sleep apnea)    Polycythemia    Prostate cancer ([place]) 2003   Shingles    Stroke ([place]) 06/2017   No residual   Unsteady gait    Ureteral calculi 02/12/2008   Left   Past Surgical History:  Procedure Laterality Date   APPENDECTOMY     childhood   CHOLECYSTECTOMY     COLONOSCOPY     HEMORRHOID BANDING  2015   HOLMIUM LASER APPLICATION Left 90/08/90   Procedure: HOLMIUM LASER APPLICATION;  Surgeon: Franchot Gallo, MD;  Location: [facility];  Service: Urology;  Laterality: Left;   IR ANGIO EXTERNAL CAROTID SEL EXT CAROTID UNI R MOD SED  07/10/2017   IR ANGIO INTRA EXTRACRAN SEL INTERNAL CAROTID BILAT MOD SED  07/10/2017   IR ANGIO VERTEBRAL SEL VERTEBRAL BILAT MOD SED  07/10/2017   IR URETERAL STENT LEFT  NEW ACCESS W/O SEP NEPHROSTOMY CATH  03/12/2018   LEFT HEART CATHETERIZATION WITH CORONARY ANGIOGRAM N/A 03/15/2013   Procedure: LEFT HEART CATHETERIZATION WITH CORONARY ANGIOGRAM;  Surgeon: Minus Breeding, MD;  Location: [facility] CATH LAB;  Service: Cardiovascular;  Laterality: N/A;   NEPHROLITHOTOMY Left 03/12/2018   Procedure: LEFT NEPHROLITHOTOMY PERCUTANEOUS, STENT PLACEMENT;  Surgeon: Franchot Gallo, MD;  Location: [facility];  Service: Urology;  Laterality: Left;   PROSTATECTOMY     STONE EXTRACTION WITH BASKET      FAMILY HISTORY Family History  Problem Relation Age of Onset   Hypertension Mother    Coronary artery disease Mother 13    Breast cancer Mother    Coronary artery disease Father 74       Died age 57   CAD Brother 65    SOCIAL HISTORY Social History   Tobacco Use   Smoking status: Never   Smokeless tobacco: Never  Vaping Use   Vaping Use: Never used  Substance Use Topics   Alcohol use: No   Drug use: No         OPHTHALMIC EXAM:  Base Eye Exam     Visual Acuity (Snellen - Linear)       Right Left   Dist [place] 20/30 -1 20/400         Tonometry (Tonopen, 8:30 AM)       Right Left   Pressure 10 12         Extraocular Movement       Right Left    Ortho Ortho    -- -- --  --  --  -- -- --   -- -- --  --  --  -- -- --           Neuro/Psych     Oriented x3: Yes   Mood/Affect: Normal         Dilation     Both eyes: 1.0% Mydriacyl, 2.5% Phenylephrine @ 8:28 AM           Slit Lamp and Fundus Exam     External Exam       Right Left   External Normal Normal         Slit Lamp Exam       Right Left   Lids/Lashes Normal Normal   Conjunctiva/Sclera White and quiet White and quiet   Cornea Clear Clear   Anterior Chamber Deep and quiet Deep and quiet   Iris Round and reactive Round and reactive   Lens Posterior chamber intraocular lens Posterior chamber intraocular lens   Anterior Vitreous Normal Normal         Fundus Exam       Right Left   Posterior Vitreous Posterior vitreous detachment Posterior vitreous detachment, Central vitreous floaters   Disc Normal Normal   C/D Ratio 0.55 0.45   Macula Intermediate age related macular degeneration, no exudates, no hemorrhage, no membrane  Retinal pigment epithelial mottling, Subretinal fibrosis, Hard drusen, Disciform scar,peripheral scarring , no macular thickening, Geographic atrophy subfoveal approximately 12 DAs size   Vessels Normal Normal   Periphery Normal Normal, attached, 25 d, 90 d            IMAGING AND PROCEDURES  Imaging and Procedures for 02/20/22  OCT, Retina - OU - Both Eyes        Right Eye Quality was good. Scan locations included subfoveal. Central Foveal Thickness: 286. Progression has been stable. Findings include abnormal foveal contour, retinal drusen .  Left Eye Quality was good. Scan locations included subfoveal. Central Foveal Thickness: 242. Progression has improved. Findings include abnormal foveal contour, retinal drusen , disciform scar, subretinal scarring.   Notes OS, no sign of active CNVM perifoveal.  Subfoveal disciform scar remained stable.  Current interval of examination is at 53-month no signs of CNVM     Color Fundus Photography Optos - OU - Both Eyes       Right Eye Macula : drusen, geographic atrophy, retinal pigment epithelium abnormalities. Vessels : normal observations. Periphery : normal observations.   Left Eye Progression has worsened. Disc findings include normal observations. Macula : geographic atrophy, retinal pigment epithelium abnormalities, hemorrhage.   Notes OS, central geographic atrophy with no edges and with no subretinal hemorrhage small in past months, no activity will observe             ASSESSMENT/PLAN:  Exudative age-related macular degeneration of left eye with active choroidal neovascularization (HCC) No active disease OS.  We will continue to observe  Intermediate stage nonexudative age-related macular degeneration of right eye No sign of CNVM OD observe  Posterior vitreous detachment, right eye Physiologic no holes or tears  Posterior vitreous detachment of left eye Physiologic no holes or tears      ICD-10-CM   1. Exudative age-related macular degeneration of left eye with active choroidal neovascularization (HCC)  H35.3221 OCT, Retina - OU - Both Eyes    Color Fundus Photography Optos - OU - Both Eyes    2. Intermediate stage nonexudative age-related macular degeneration of right eye  H35.3112     3. Posterior vitreous detachment, right eye  H43.811     4. Posterior vitreous  detachment of left eye  H43.812       1.  Patient understands critical portance of continued monitoring of each eye.  If any change or distortion of vision develops in either eye but particular the right eye promptly contact the office during regular scheduled work week 2.  3.  Ophthalmic Meds Ordered this visit:  No orders of the defined types were placed in this encounter.      Return in about 3 months (around 05/22/2022) for DILATE OU, COLOR FP, OCT.  There are no Patient Instructions on file for this visit.   Explained the diagnoses, plan, and follow up with the patient and they expressed understanding.  Patient expressed understanding of the importance of proper follow up care.   GClent DemarkRankin M.D. Diseases & Surgery of the Retina and Vitreous [facility]09/13/23     Abbreviations: M myopia (nearsighted); A astigmatism; H hyperopia (farsighted); P presbyopia; Mrx spectacle prescription;  CTL contact lenses; OD right eye; OS left eye; OU both eyes  XT exotropia; ET esotropia; PEK punctate epithelial keratitis; PEE punctate epithelial erosions; DES dry eye syndrome; MGD meibomian gland dysfunction; ATs artificial tears; PFAT's preservative free artificial tears; NCollin[place]nuclear sclerotic cataract; PSC posterior subcapsular cataract; ERM epi-retinal membrane; PVD posterior vitreous detachment; RD retinal detachment; DM diabetes mellitus; DR diabetic retinopathy; NPDR non-proliferative diabetic retinopathy; PDR proliferative diabetic retinopathy; CSME clinically significant macular edema; DME diabetic macular edema; dbh dot blot hemorrhages; CWS cotton wool spot; POAG primary open angle glaucoma; C/D cup-to-disc ratio; HVF humphrey visual field; GVF goldmann visual field; OCT optical coherence tomography; IOP intraocular pressure; BRVO Branch retinal vein occlusion; CRVO central retinal vein occlusion; CRAO central retinal artery occlusion; BRAO branch retinal artery  occlusion; RT retinal tear; SB scleral buckle; PPV pars plana  vitrectomy; VH Vitreous hemorrhage; PRP panretinal laser photocoagulation; IVK intravitreal kenalog; VMT vitreomacular traction; MH Macular hole;  NVD neovascularization of the disc; NVE neovascularization elsewhere; AREDS age related eye disease study; ARMD age related macular degeneration; POAG primary open angle glaucoma; EBMD epithelial/anterior basement membrane dystrophy; ACIOL anterior chamber intraocular lens; IOL intraocular lens; PCIOL posterior chamber intraocular lens; Phaco/IOL phacoemulsification with intraocular lens placement; [place] photorefractive keratectomy; LASIK laser assisted in situ keratomileusis; HTN hypertension; DM diabetes mellitus; COPD chronic obstructive pulmonary disease

## 2022-02-20 NOTE — Assessment & Plan Note (Signed)
No active disease OS.  We will continue to observe

## 2022-02-20 NOTE — Assessment & Plan Note (Signed)
No sign of CNVM OD observe

## 2022-03-05 DIAGNOSIS — I10 Essential (primary) hypertension: Secondary | ICD-10-CM | POA: Diagnosis not present

## 2022-03-05 DIAGNOSIS — Z79899 Other long term (current) drug therapy: Secondary | ICD-10-CM | POA: Diagnosis not present

## 2022-03-05 DIAGNOSIS — Z1331 Encounter for screening for depression: Secondary | ICD-10-CM | POA: Diagnosis not present

## 2022-03-05 DIAGNOSIS — I700 Atherosclerosis of aorta: Secondary | ICD-10-CM | POA: Diagnosis not present

## 2022-03-05 DIAGNOSIS — Z0000 Encounter for general adult medical examination without abnormal findings: Secondary | ICD-10-CM | POA: Diagnosis not present

## 2022-03-05 DIAGNOSIS — G4733 Obstructive sleep apnea (adult) (pediatric): Secondary | ICD-10-CM | POA: Diagnosis not present

## 2022-03-05 DIAGNOSIS — H353221 Exudative age-related macular degeneration, left eye, with active choroidal neovascularization: Secondary | ICD-10-CM | POA: Diagnosis not present

## 2022-03-05 DIAGNOSIS — Z23 Encounter for immunization: Secondary | ICD-10-CM | POA: Diagnosis not present

## 2022-03-05 DIAGNOSIS — E7800 Pure hypercholesterolemia, unspecified: Secondary | ICD-10-CM | POA: Diagnosis not present

## 2022-03-05 DIAGNOSIS — H3530 Unspecified macular degeneration: Secondary | ICD-10-CM | POA: Diagnosis not present

## 2022-03-06 DIAGNOSIS — Z79899 Other long term (current) drug therapy: Secondary | ICD-10-CM | POA: Diagnosis not present

## 2022-03-26 DIAGNOSIS — Z23 Encounter for immunization: Secondary | ICD-10-CM | POA: Diagnosis not present

## 2022-03-27 DIAGNOSIS — N1330 Unspecified hydronephrosis: Secondary | ICD-10-CM | POA: Diagnosis not present

## 2022-03-27 DIAGNOSIS — N200 Calculus of kidney: Secondary | ICD-10-CM | POA: Diagnosis not present

## 2022-03-27 DIAGNOSIS — Z8546 Personal history of malignant neoplasm of prostate: Secondary | ICD-10-CM | POA: Diagnosis not present

## 2022-03-27 DIAGNOSIS — N135 Crossing vessel and stricture of ureter without hydronephrosis: Secondary | ICD-10-CM | POA: Diagnosis not present

## 2022-05-22 ENCOUNTER — Encounter: Payer: Self-pay | Admitting: Podiatry

## 2022-05-22 ENCOUNTER — Ambulatory Visit (INDEPENDENT_AMBULATORY_CARE_PROVIDER_SITE_OTHER): Payer: Medicare Other | Admitting: Podiatry

## 2022-05-22 ENCOUNTER — Encounter (INDEPENDENT_AMBULATORY_CARE_PROVIDER_SITE_OTHER): Payer: Medicare Other | Admitting: Ophthalmology

## 2022-05-22 VITALS — BP 131/68 | HR 89

## 2022-05-22 DIAGNOSIS — M79675 Pain in left toe(s): Secondary | ICD-10-CM

## 2022-05-22 DIAGNOSIS — M79674 Pain in right toe(s): Secondary | ICD-10-CM

## 2022-05-22 DIAGNOSIS — B351 Tinea unguium: Secondary | ICD-10-CM

## 2022-05-22 NOTE — Progress Notes (Signed)
   SUBJECTIVE Patient presents to office today complaining of elongated, thickened nails that cause pain while ambulating in shoes.  He is unable to trim his own nails. Patient is here for further evaluation and treatment.  Past Medical History:  Diagnosis Date   Adenomatous polyp    Bleeding internal hemorrhoids 10/2017   Cerebral venous thrombosis    Grade I diastolic dysfunction 41/32/4401   noted on ECHO    Hemorrhoids    History of kidney stones    Hypercholesteremia    Hypertension    Intracranial hemorrhage (HCC)    Right temporal lobe   LVH (left ventricular hypertrophy) 07/11/2017   Mild, noted on ECHO    Muscular degeneration    Nephrolithiasis    Obesity    OSA (obstructive sleep apnea)    Polycythemia    Prostate cancer ([place]) 2003   Shingles    Stroke ([place]) 06/2017   No residual   Unsteady gait    Ureteral calculi 02/12/2008   Left    OBJECTIVE General Patient is awake, alert, and oriented x 3 and in no acute distress. Derm Skin is dry and supple bilateral. Negative open lesions or macerations. Remaining integument unremarkable. Nails are tender, long, thickened and dystrophic with subungual debris, consistent with onychomycosis, 1-5 bilateral. No signs of infection noted. Vasc  DP and PT pedal pulses palpable bilaterally. Temperature gradient within normal limits.  Neuro Epicritic and protective threshold sensation grossly intact bilaterally.  Musculoskeletal Exam No symptomatic pedal deformities noted bilateral. Muscular strength within normal limits.  ASSESSMENT 1. Onychodystrophic nails 1-5 bilateral with hyperkeratosis of nails.  2. Onychomycosis of nail due to dermatophyte bilateral 3. Pain in foot bilateral  PLAN OF CARE 1. Patient evaluated today.  2. Instructed to maintain good pedal hygiene and foot care.  3. Mechanical debridement of nails 1-5 bilaterally performed using a nail nipper. Filed with dremel without incident.  4. Return to clinic in  3 mos.    Edrick Kins, DPM [facility]  Dr. Edrick Kins, DPM    [address], [place] [address]                Office [phone]  Fax [phone]

## 2022-05-23 ENCOUNTER — Encounter (INDEPENDENT_AMBULATORY_CARE_PROVIDER_SITE_OTHER): Payer: Medicare Other | Admitting: Ophthalmology

## 2022-05-23 ENCOUNTER — Encounter (INDEPENDENT_AMBULATORY_CARE_PROVIDER_SITE_OTHER): Payer: Self-pay

## 2022-05-23 DIAGNOSIS — H35431 Paving stone degeneration of retina, right eye: Secondary | ICD-10-CM | POA: Diagnosis not present

## 2022-05-23 DIAGNOSIS — H353124 Nonexudative age-related macular degeneration, left eye, advanced atrophic with subfoveal involvement: Secondary | ICD-10-CM | POA: Diagnosis not present

## 2022-05-23 DIAGNOSIS — H353112 Nonexudative age-related macular degeneration, right eye, intermediate dry stage: Secondary | ICD-10-CM | POA: Diagnosis not present

## 2022-05-23 DIAGNOSIS — G4733 Obstructive sleep apnea (adult) (pediatric): Secondary | ICD-10-CM | POA: Diagnosis not present

## 2022-05-23 DIAGNOSIS — H33322 Round hole, left eye: Secondary | ICD-10-CM | POA: Diagnosis not present

## 2022-05-23 DIAGNOSIS — H353221 Exudative age-related macular degeneration, left eye, with active choroidal neovascularization: Secondary | ICD-10-CM | POA: Diagnosis not present

## 2022-05-23 DIAGNOSIS — H43813 Vitreous degeneration, bilateral: Secondary | ICD-10-CM | POA: Diagnosis not present

## 2022-08-21 ENCOUNTER — Ambulatory Visit: Payer: Medicare Other | Admitting: Podiatry

## 2022-08-21 DIAGNOSIS — M79674 Pain in right toe(s): Secondary | ICD-10-CM

## 2022-08-21 DIAGNOSIS — M79675 Pain in left toe(s): Secondary | ICD-10-CM | POA: Diagnosis not present

## 2022-08-21 DIAGNOSIS — B351 Tinea unguium: Secondary | ICD-10-CM | POA: Diagnosis not present

## 2022-08-21 NOTE — Progress Notes (Signed)
   SUBJECTIVE Patient presents to office today complaining of elongated, thickened nails that cause pain while ambulating in shoes.  He is unable to trim his own nails. Patient is here for further evaluation and treatment.  Past Medical History:  Diagnosis Date   Adenomatous polyp    Bleeding internal hemorrhoids 10/2017   Cerebral venous thrombosis    Grade I diastolic dysfunction 07/11/2017   noted on ECHO    Hemorrhoids    History of kidney stones    Hypercholesteremia    Hypertension    Intracranial hemorrhage (HCC)    Right temporal lobe   LVH (left ventricular hypertrophy) 07/11/2017   Mild, noted on ECHO    Muscular degeneration    Nephrolithiasis    Obesity    OSA (obstructive sleep apnea)    Polycythemia    Prostate cancer ([facility]) 2003   Shingles    Stroke (HCC) 06/2017   No residual   Unsteady gait    Ureteral calculi 02/12/2008   Left    OBJECTIVE General Patient is awake, alert, and oriented x 3 and in no acute distress. Derm Skin is dry and supple bilateral. Negative open lesions or macerations. Remaining integument unremarkable. Nails are tender, long, thickened and dystrophic with subungual debris, consistent with onychomycosis, 1-5 bilateral. No signs of infection noted. Vasc  DP and PT pedal pulses palpable bilaterally. Temperature gradient within normal limits.  Neuro Epicritic and protective threshold sensation grossly intact bilaterally.  Musculoskeletal Exam No symptomatic pedal deformities noted bilateral. Muscular strength within normal limits.  ASSESSMENT 1. Onychodystrophic nails 1-5 bilateral with hyperkeratosis of nails.  2. Onychomycosis of nail due to dermatophyte bilateral 3. Pain in foot bilateral  PLAN OF CARE 1. Patient evaluated today.  2. Instructed to maintain good pedal hygiene and foot care.  3. Mechanical debridement of nails 1-5 bilaterally performed using a nail nipper. Filed with dremel without incident.  4. Return to clinic in  3 mos.    Justin Adams, DPM [facility]  Dr. Johnjoseph Rolfe M. Jayant Kriz, DPM    [address]                                        [place], [place] [address]                Office [phone]  Fax [phone]   

## 2022-08-27 ENCOUNTER — Other Ambulatory Visit: Payer: Self-pay | Admitting: Internal Medicine

## 2022-08-27 DIAGNOSIS — I10 Essential (primary) hypertension: Secondary | ICD-10-CM

## 2022-09-30 ENCOUNTER — Ambulatory Visit
Admission: RE | Admit: 2022-09-30 | Discharge: 2022-09-30 | Disposition: A | Discharge status: Home / Self Care | Payer: No Typology Code available for payment source | Source: Ambulatory Visit | Attending: Internal Medicine | Admitting: Internal Medicine

## 2022-09-30 DIAGNOSIS — I10 Essential (primary) hypertension: Secondary | ICD-10-CM

## 2022-11-26 ENCOUNTER — Ambulatory Visit: Payer: Self-pay | Admitting: Cardiology

## 2022-11-27 ENCOUNTER — Ambulatory Visit: Payer: Medicare Other | Admitting: Podiatry

## 2022-11-27 DIAGNOSIS — B351 Tinea unguium: Secondary | ICD-10-CM | POA: Diagnosis not present

## 2022-11-27 DIAGNOSIS — M79674 Pain in right toe(s): Secondary | ICD-10-CM | POA: Diagnosis not present

## 2022-11-27 DIAGNOSIS — M79675 Pain in left toe(s): Secondary | ICD-10-CM

## 2022-11-27 NOTE — Progress Notes (Signed)
   SUBJECTIVE Patient presents to office today complaining of elongated, thickened nails that cause pain while ambulating in shoes.  He is unable to trim his own nails. Patient is here for further evaluation and treatment.  Past Medical History:  Diagnosis Date   Adenomatous polyp    Bleeding internal hemorrhoids 10/2017   Cerebral venous thrombosis    Grade I diastolic dysfunction 07/11/2017   noted on ECHO    Hemorrhoids    History of kidney stones    Hypercholesteremia    Hypertension    Intracranial hemorrhage (HCC)    Right temporal lobe   LVH (left ventricular hypertrophy) 07/11/2017   Mild, noted on ECHO    Muscular degeneration    Nephrolithiasis    Obesity    OSA (obstructive sleep apnea)    Polycythemia    Prostate cancer (HCC) 2003   Shingles    Stroke (HCC) 06/2017   No residual   Unsteady gait    Ureteral calculi 02/12/2008   Left    OBJECTIVE General Patient is awake, alert, and oriented x 3 and in no acute distress. Derm Skin is dry and supple bilateral. Negative open lesions or macerations. Remaining integument unremarkable. Nails are tender, long, thickened and dystrophic with subungual debris, consistent with onychomycosis, 1-5 bilateral. No signs of infection noted. Vasc  DP and PT pedal pulses palpable bilaterally. Temperature gradient within normal limits.  Neuro Epicritic and protective threshold sensation grossly intact bilaterally.  Musculoskeletal Exam No symptomatic pedal deformities noted bilateral. Muscular strength within normal limits.  ASSESSMENT 1. Onychodystrophic nails 1-5 bilateral with hyperkeratosis of nails.  2. Onychomycosis of nail due to dermatophyte bilateral 3. Pain in foot bilateral  PLAN OF CARE 1. Patient evaluated today.  2. Instructed to maintain good pedal hygiene and foot care.  3. Mechanical debridement of nails 1-5 bilaterally performed using a nail nipper. Filed with dremel without incident.  4. Return to clinic in  3 mos.    Jovon Streetman M. Thekla Colborn, DPM [facility]  Dr. Jalie Eiland M. Daleyssa Loiselle, DPM    [address]                                        [place], [place] [address]                Office [phone]  Fax [phone]   

## 2022-12-02 ENCOUNTER — Encounter: Payer: Self-pay | Admitting: Cardiology

## 2022-12-02 NOTE — Progress Notes (Signed)
Cardiology Office Note   Date:  12/04/2022   ID:  Justin Adams, DOB June 01, 1946, MRN [id number]  PCP:  Justin Aspen, MD  Cardiologist:   None Referring:  Justin Aspen, MD  Chief Complaint  Patient presents with   Coronary Artery Disease      History of Present Illness: Justin Adams is a 77 y.o. male who presents for evaluation of elevated coronary calcium.  He has no past cardiac history.  He did have a cerebral venous thrombosis involving the right transverse sinus, sigmoid sinus and jugular vein with right temporal intracranial hemorrhage in 2019.  He had an EF of 55% at that time.  He has been treated with DOAC since then.  He does have hypertension and dyslipidemia.  He was noted recently on screening CT to have a score of 1572 which was 80th percentile.  There is a small lung nodule.  The patient denies any new symptoms such as chest discomfort, neck or arm discomfort. There has been no new shortness of breath, PND or orthopnea. There have been no reported palpitations, presyncope or syncope.   He exercises routinely.  He walks at least a mile a day.  He does more activities.  The patient denies any new symptoms such as chest discomfort, neck or arm discomfort. There has been no new shortness of breath, PND or orthopnea. There have been no reported palpitations, presyncope or syncope.    Past Medical History:  Diagnosis Date   Adenomatous polyp    Bleeding internal hemorrhoids 10/2017   Cerebral venous thrombosis    Grade I diastolic dysfunction 07/11/2017   noted on ECHO    Hemorrhoids    Hypercholesteremia    Hypertension    Intracranial hemorrhage (HCC)    Right temporal lobe   LVH (left ventricular hypertrophy) 07/11/2017   Mild, noted on ECHO    Muscular degeneration    Nephrolithiasis    Obesity    OSA (obstructive sleep apnea)    Polycythemia    Prostate cancer (HCC) 2003   Shingles    Stroke (HCC) 06/2017   No residual    Ureteral calculi 02/12/2008   Left    Past Surgical History:  Procedure Laterality Date   APPENDECTOMY     childhood   CHOLECYSTECTOMY     COLONOSCOPY     HEMORRHOID BANDING  2015   HOLMIUM LASER APPLICATION Left 03/12/2018   Procedure: HOLMIUM LASER APPLICATION;  Surgeon: Marcine Matar, MD;  Location: [facility];  Service: Urology;  Laterality: Left;   IR ANGIO EXTERNAL CAROTID SEL EXT CAROTID UNI R MOD SED  07/10/2017   IR ANGIO INTRA EXTRACRAN SEL INTERNAL CAROTID BILAT MOD SED  07/10/2017   IR ANGIO VERTEBRAL SEL VERTEBRAL BILAT MOD SED  07/10/2017   IR URETERAL STENT LEFT NEW ACCESS W/O SEP NEPHROSTOMY CATH  03/12/2018   LEFT HEART CATHETERIZATION WITH CORONARY ANGIOGRAM N/A 03/15/2013   Procedure: LEFT HEART CATHETERIZATION WITH CORONARY ANGIOGRAM;  Surgeon: Rollene Rotunda, MD;  Location: [facility] CATH LAB;  Service: Cardiovascular;  Laterality: N/A;   NEPHROLITHOTOMY Left 03/12/2018   Procedure: LEFT NEPHROLITHOTOMY PERCUTANEOUS, STENT PLACEMENT;  Surgeon: Marcine Matar, MD;  Location: [facility];  Service: Urology;  Laterality: Left;   PROSTATECTOMY     STONE EXTRACTION WITH BASKET       Current Outpatient Medications  Medication Sig Dispense Refill   apixaban (ELIQUIS) 5 MG TABS tablet Take 1 tablet (5 mg total) by mouth 2 (two) times  daily. 180 tablet 0   atorvastatin (LIPITOR) 20 MG tablet Take 1 tablet (20 mg total) by mouth daily. 90 tablet 0   gabapentin (NEURONTIN) 300 MG capsule Take 1 capsule (300 mg total) by mouth 2 (two) times daily. 60 capsule 11   lisinopril (PRINIVIL,ZESTRIL) 20 MG tablet Take 1 tablet (20 mg total) by mouth daily. 30 tablet 1   polyethylene glycol (MIRALAX / GLYCOLAX) packet Take 17 g by mouth at bedtime.     No current facility-administered medications for this visit.    Allergies:   Codeine    Social History:  The patient  reports that he has never smoked. He has never used smokeless tobacco. He reports that he does not drink alcohol and does  not use drugs.   Family History:  The patient's family history includes Breast cancer in his mother; CAD (age of onset: 16) in his brother; Coronary artery disease (age of onset: 23) in his father; Coronary artery disease (age of onset: 14) in his mother; Hypertension in his mother.    ROS:  Please see the history of present illness.   Otherwise, review of systems are positive for none.   All other systems are reviewed and negative.    PHYSICAL EXAM: VS:  BP 122/68 (BP Location: Left Arm, Patient Position: Sitting, Cuff Size: Normal)   Pulse 71   Ht 6\' 2"  (1.88 m)   Wt 220 lb 9.6 oz (100.1 kg)   SpO2 96%   BMI 28.32 kg/m  , BMI Body mass index is 28.32 kg/m. GENERAL:  Well appearing HEENT:  Pupils equal round and reactive, fundi not visualized, oral mucosa unremarkable NECK:  No jugular venous distention, waveform within normal limits, carotid upstroke brisk and symmetric, no bruits, no thyromegaly LYMPHATICS:  No cervical, inguinal adenopathy LUNGS:  Clear to auscultation bilaterally BACK:  No CVA tenderness CHEST:  Unremarkable HEART:  PMI not displaced or sustained,S1 and S2 within normal limits, no S3, no S4, no clicks, no rubs, ' murmurs ABD:  Flat, positive bowel sounds normal in frequency in pitch, no bruits, no rebound, no guarding, no midline pulsatile mass, no hepatomegaly, no splenomegaly EXT:  2 plus pulses throughout, no edema, no cyanosis no clubbing SKIN:  No rashes no nodules NEURO:  Cranial nerves II through XII grossly intact, motor grossly intact throughout [facility]:  Cognitively intact, oriented to person place and time  EKG:  EKG Interpretation  Date/Time:  Wednesday December 04 2022 08:47:03 EDT Ventricular Rate:  72 PR Interval:  188 QRS Duration: 96 QT Interval:  402 QTC Calculation: 440 R Axis:   34 Text Interpretation: Normal sinus rhythm Normal ECG When compared with ECG of 09-Jul-2017 20:28, No significant change since last tracing Confirmed by Rollene Rotunda ([id number]) on 12/04/2022 8:57:20 AM    Recent Labs: No results found for requested labs within last 365 days.    Lipid Panel    Component Value Date/Time   CHOL 159 07/11/2017 0303   TRIG 78 07/11/2017 0303   HDL 36 (L) 07/11/2017 0303   CHOLHDL 4.4 07/11/2017 0303   VLDL 16 07/11/2017 0303   LDLCALC 107 (H) 07/11/2017 0303      Wt Readings from Last 3 Encounters:  12/04/22 220 lb 9.6 oz (100.1 kg)  12/07/21 235 lb 12.8 oz (107 kg)  08/01/21 226 lb (102.5 kg)      Other studies Reviewed: Additional studies/ records that were reviewed today include: Labs. Review of the above records demonstrates:  Please  see elsewhere in the note.     ASSESSMENT AND PLAN:  Elevated coronary calcium: The patient does have elevated coronary calcium but no symptoms. I will bring the patient back for a POET (Plain Old Exercise Test). This will allow me to screen for obstructive coronary disease, risk stratify and very importantly provide a prescription for exercise.  HTN: The blood pressure is upper limits that he has and keep an eye on this.  I would like systolic to be in the 120s.  I will encourage some slight weight loss and I think you will get to goal.  Dyslipidemia: LDL 61 with an HDL of 48.  No change in therapy.  Lung nodule: I will defer screening to his primary provider who ordered his CT.   Current medicines are reviewed at length with the patient today.  The patient does not have concerns regarding medicines.  The following changes have been made:  no change  Labs/ tests ordered today include:   Orders Placed This Encounter  Procedures   Cardiac Stress Test: Informed Consent Details: Physician/Practitioner Attestation; Transcribe to consent form and obtain patient signature   Exercise Tolerance Test   EKG 12-Lead     Disposition:   FU with as needed based on the results of the above.   Signed, Rollene Rotunda, MD  12/04/2022 8:59 AM    [facility]

## 2022-12-04 ENCOUNTER — Ambulatory Visit: Payer: Medicare Other | Attending: Cardiology | Admitting: Cardiology

## 2022-12-04 ENCOUNTER — Encounter: Payer: Self-pay | Admitting: Cardiology

## 2022-12-04 VITALS — BP 122/68 | HR 71 | Ht 74.0 in | Wt 220.6 lb

## 2022-12-04 DIAGNOSIS — R931 Abnormal findings on diagnostic imaging of heart and coronary circulation: Secondary | ICD-10-CM | POA: Diagnosis not present

## 2022-12-04 DIAGNOSIS — I10 Essential (primary) hypertension: Secondary | ICD-10-CM

## 2022-12-04 DIAGNOSIS — E785 Hyperlipidemia, unspecified: Secondary | ICD-10-CM | POA: Diagnosis not present

## 2022-12-04 DIAGNOSIS — I2510 Atherosclerotic heart disease of native coronary artery without angina pectoris: Secondary | ICD-10-CM | POA: Diagnosis not present

## 2022-12-04 NOTE — Patient Instructions (Signed)
Medication Instructions:  Your physician recommends that you continue on your current medications as directed. Please refer to the Current Medication list given to you today. *If you need a refill on your cardiac medications before your next appointment, please call your pharmacy*   Lab Work: None ordered If you have labs (blood work) drawn today and your tests are completely normal, you will receive your results only by: MyChart Message (if you have MyChart) OR A paper copy in the mail If you have any lab test that is abnormal or we need to change your treatment, we will call you to review the results.   Testing/Procedures: Your physician has requested that you have an exercise tolerance test. For further information please visit [email or web address] Please also follow instruction sheet, as given.   Follow-Up: At [facility], you and your health needs are our priority.  As part of our continuing mission to provide you with exceptional heart care, we have created designated Provider Care Teams.  These Care Teams include your primary Cardiologist (physician) and Advanced Practice Providers (APPs -  Physician Assistants and Nurse Practitioners) who all work together to provide you with the care you need, when you need it.  We recommend signing up for the patient portal called "MyChart".  Sign up information is provided on this After Visit Summary.  MyChart is used to connect with patients for Virtual Visits (Telemedicine).  Patients are able to view lab/test results, encounter notes, upcoming appointments, etc.  Non-urgent messages can be sent to your provider as well.   To learn more about what you can do with MyChart, go to [email or web address].    Your next appointment:   FOLLOW UP AS NEEDED   Provider:   Rollene Rotunda, MD   Other Instructions

## 2022-12-05 NOTE — Progress Notes (Deleted)
NEUROLOGY FOLLOW UP OFFICE NOTE  Justin Adams [id number]  Assessment/Plan:   Primary stabbing headache History of cerebral venous thrombosis with right temporal intracerebral hemorrhage.   1  Gabapentin 300mg  - prescribed for twice daily but he may take once daily if that is effective. 2  Follow up 1 year ***   Subjective:  Justin Adams is a 77 year old right-handed male with hypertension, hypercholesterolemia, and OSA, history of prostate cancer, and history of cerebral hemorrhage secondary to cerebral venous thrombosis who follows up for headache.   UPDATE: Medications include:  Gabapentin 300mg  daily, Eliquis, lisinopril 20mg , atorvastatin 20mg    ***  HISTORY: He was admitted to [facility] on 07/09/17 for right frontal headache and nausea.  MRI and MRV of head showed right temporal intracerebral hemorrhage with thromboses of the right transverse sinus, sigmoid sinus, and right jugular vein, which was confirmed on diagnostic angiogram.  MRA of the head showed no intracranial arterial abnormality such as aneurysm.  CT of the chest abdomen and pelvis revealed no malignancy.  2D echocardiogram revealed ejection fraction of 55%.  DRVVT borderline elevated at 49.7 but otherwise hypercoagulable panel was negative.  LDL was 103 and hemoglobin A1c was 5.3.  He has history of polycythemia with Hgb level 17.3-17.5 going back several years.  Hgb on day of admission was 17.5.  Subsequent Hgb levels have been around 16.  He was started on IV heparin and was subsequently transitioned to Eliquis.  He was also discharged on Lipitor 20 mg daily.   He later had a CTV on 09/05/17 which demonstrated interval resolution of the right temporal lobe intraparenchymal hemorrhage with residual encephalomalacia and interval improvement/parital recanalization of right-sided dural venous thrombosis with persistent nonocclusive thrombus within the right transverse and sigmoid sinuses and proximal  right internal jugular vein.   In November 2021, he began experiencing new mild to moderate paroxysmal jabs in the right frontal region every 5 to 20 minutes.  No severe headache such as how he presented for the cerebral hemorrhage and thrombosis.  No dizziness, sensory deficits, speech deficits, or focal weakness.  He has macular degeneration but no new visual disturbance. He has tried Tylenol and Advil which have been ineffective.  He has no prior history of headaches and this headache is in the same area when he had the ICH.  CT/CTA of head on 06/16/2020 personally reviewed showed sequelae of prior parenchymal hemorrhage and persistent partial thrombosis of right transverse and sigmoid sinuses and persistent multifocal nonocculsive thrombus within the superior sagittal sinus but no acute or new intracranial abnormalities.  Melatonin ineffective.  Responded to gabapentin.    PAST MEDICAL HISTORY: Past Medical History:  Diagnosis Date   Adenomatous polyp    Bleeding internal hemorrhoids 10/2017   Cerebral venous thrombosis    Grade I diastolic dysfunction 07/11/2017   noted on ECHO    Hemorrhoids    History of kidney stones    Hypercholesteremia    Hypertension    Intracranial hemorrhage (HCC)    Right temporal lobe   LVH (left ventricular hypertrophy) 07/11/2017   Mild, noted on ECHO    Muscular degeneration    Nephrolithiasis    Obesity    OSA (obstructive sleep apnea)    Polycythemia    Prostate cancer (HCC) 2003   Shingles    Stroke (HCC) 06/2017   No residual   Unsteady gait    Ureteral calculi 02/12/2008   Left    MEDICATIONS: Current Outpatient Medications  on File Prior to Visit  Medication Sig Dispense Refill   apixaban (ELIQUIS) 5 MG TABS tablet Take 1 tablet (5 mg total) by mouth 2 (two) times daily. 180 tablet 0   atorvastatin (LIPITOR) 20 MG tablet Take 1 tablet (20 mg total) by mouth daily. 90 tablet 0   gabapentin (NEURONTIN) 300 MG capsule Take 1 capsule (300 mg  total) by mouth 2 (two) times daily. 60 capsule 0   lisinopril (PRINIVIL,ZESTRIL) 20 MG tablet Take 1 tablet (20 mg total) by mouth daily. 30 tablet 1   polyethylene glycol (MIRALAX / GLYCOLAX) packet Take 17 g by mouth at bedtime.     Wheat Dextrin (BENEFIBER) POWD Take 1 Dose by mouth daily.     No current facility-administered medications on file prior to visit.    ALLERGIES: Allergies  Allergen Reactions   Codeine Nausea And Vomiting    FAMILY HISTORY: Family History  Problem Relation Age of Onset   Hypertension Mother    Coronary artery disease Mother 25   Breast cancer Mother    Coronary artery disease Father 3       Died age 69   CAD Brother 15      Objective:  *** General: No acute distress.  Patient appears well-groomed.   Head:  Normocephalic/atraumatic Neck:  Supple.  No paraspinal tenderness.  Full range of motion. Heart:  Regular rate and rhythm. Neuro:  Alert and oriented.  Speech fluent and not dysarthric.  Language intact.  CN II-XII intact.  Bulk and tone normal.  Muscle strength 5/5 throughout.  Deep tendon reflexes 2+ throughout.  Gait normal.  Romberg negative.   Shon Millet, DO  CC: Merlene Laughter, MD

## 2022-12-09 ENCOUNTER — Ambulatory Visit: Payer: Medicare Other | Admitting: Neurology

## 2022-12-26 ENCOUNTER — Telehealth (HOSPITAL_COMMUNITY): Payer: Self-pay

## 2022-12-26 NOTE — Telephone Encounter (Signed)
Attempted to contact the patient, instructions were given. Asked to call back with any questions. S.Nicholos Aloisi EMTP/CCT

## 2022-12-30 ENCOUNTER — Ambulatory Visit: Payer: Medicare Other | Admitting: Neurology

## 2022-12-30 ENCOUNTER — Encounter: Payer: Self-pay | Admitting: Neurology

## 2022-12-30 VITALS — BP 111/65 | HR 75 | Ht 72.0 in | Wt 225.2 lb

## 2022-12-30 DIAGNOSIS — G4485 Primary stabbing headache: Secondary | ICD-10-CM

## 2022-12-30 DIAGNOSIS — Z86718 Personal history of other venous thrombosis and embolism: Secondary | ICD-10-CM

## 2022-12-30 NOTE — Progress Notes (Unsigned)
NEUROLOGY FOLLOW UP OFFICE NOTE  Justin Adams [id number]  Assessment/Plan:   Primary stabbing headache, resolved History of cerebral venous thrombosis with right temporal intracerebral hemorrhage.   1  Will discontinue gabapentin completely, as headaches are likely resolved on its own 2  Follow up as needed.   Subjective:  Justin Adams is an 77 year old right-handed male with hypertension, hypercholesterolemia, and OSA, history of prostate cancer, and history of cerebral hemorrhage secondary to cerebral venous thrombosis who follows up for headache.   UPDATE: Medications include:  Gabapentin 300mg  wice a week, Eliquis, lisinopril 20mg , atorvastatin 20mg    Now takes gabapentin only twice a week.  No headaches.    HISTORY: He was admitted to [facility] on 07/09/17 for right frontal headache and nausea.  MRI and MRV of head showed right temporal intracerebral hemorrhage with thromboses of the right transverse sinus, sigmoid sinus, and right jugular vein, which was confirmed on diagnostic angiogram.  MRA of the head showed no intracranial arterial abnormality such as aneurysm.  CT of the chest abdomen and pelvis revealed no malignancy.  2D echocardiogram revealed ejection fraction of 55%.  DRVVT borderline elevated at 49.7 but otherwise hypercoagulable panel was negative.  LDL was 103 and hemoglobin A1c was 5.3.  He has history of polycythemia with Hgb level 17.3-17.5 going back several years.  Hgb on day of admission was 17.5.  Subsequent Hgb levels have been around 16.  He was started on IV heparin and was subsequently transitioned to Eliquis.  He was also discharged on Lipitor 20 mg daily.   He later had a CTV on 09/05/17 which demonstrated interval resolution of the right temporal lobe intraparenchymal hemorrhage with residual encephalomalacia and interval improvement/parital recanalization of right-sided dural venous thrombosis with persistent nonocclusive thrombus  within the right transverse and sigmoid sinuses and proximal right internal jugular vein.   In November 2021, he began experiencing new mild to moderate paroxysmal jabs in the right frontal region every 5 to 20 minutes.  No severe headache such as how he presented for the cerebral hemorrhage and thrombosis.  No dizziness, sensory deficits, speech deficits, or focal weakness.  He has macular degeneration but no new visual disturbance. He has tried Tylenol and Advil which have been ineffective.  He has no prior history of headaches and this headache is in the same area when he had the ICH.  CT/CTA of head on 06/16/2020 personally reviewed showed sequelae of prior parenchymal hemorrhage and persistent partial thrombosis of right transverse and sigmoid sinuses and persistent multifocal nonocculsive thrombus within the superior sagittal sinus but no acute or new intracranial abnormalities.  Melatonin ineffective.  Responded to gabapentin.    PAST MEDICAL HISTORY: Past Medical History:  Diagnosis Date   Adenomatous polyp    Bleeding internal hemorrhoids 10/2017   Cerebral venous thrombosis    Grade I diastolic dysfunction 07/11/2017   noted on ECHO    Hemorrhoids    Hypercholesteremia    Hypertension    Intracranial hemorrhage (HCC)    Right temporal lobe   LVH (left ventricular hypertrophy) 07/11/2017   Mild, noted on ECHO    Muscular degeneration    Nephrolithiasis    Obesity    OSA (obstructive sleep apnea)    Polycythemia    Prostate cancer (HCC) 2003   Shingles    Stroke (HCC) 06/2017   No residual   Ureteral calculi 02/12/2008   Left    MEDICATIONS: Current Outpatient Medications on File Prior to  Visit  Medication Sig Dispense Refill   apixaban (ELIQUIS) 5 MG TABS tablet Take 1 tablet (5 mg total) by mouth 2 (two) times daily. 180 tablet 0   atorvastatin (LIPITOR) 20 MG tablet Take 1 tablet (20 mg total) by mouth daily. 90 tablet 0   gabapentin (NEURONTIN) 300 MG capsule Take 1  capsule (300 mg total) by mouth 2 (two) times daily. 60 capsule 11   lisinopril (PRINIVIL,ZESTRIL) 20 MG tablet Take 1 tablet (20 mg total) by mouth daily. 30 tablet 1   No current facility-administered medications on file prior to visit.     ALLERGIES: Allergies  Allergen Reactions   Codeine Nausea And Vomiting    FAMILY HISTORY: Family History  Problem Relation Age of Onset   Hypertension Mother    Coronary artery disease Mother 63   Breast cancer Mother    Coronary artery disease Father 95       Died age 37   CAD Brother 41      Objective:  Blood pressure 111/65, pulse 75, height 6' (1.829 m), weight 225 lb 3.2 oz (102.2 kg), SpO2 97%. General: No acute distress.  Patient appears well-groomed.   Head:  Normocephalic/atraumatic Neck:  Supple.  No paraspinal tenderness.  Full range of motion. Heart:  Regular rate and rhythm. Neuro:  alert and oriented.  Speech fluent and not dysarthric, language intact.  CN II-XII intact. Bulk and tone normal, muscle strength 5/5 throughout.  Sensation to light touch intact.  Deep tendon reflexes 2+ throughout.  Finger to nose testing intact.  Gait normal, Romberg negative.   Shon Millet, DO  CC: Eleanora Neighbor, MD

## 2022-12-31 ENCOUNTER — Ambulatory Visit (HOSPITAL_COMMUNITY): Payer: Medicare Other | Attending: Cardiology

## 2022-12-31 ENCOUNTER — Encounter (HOSPITAL_COMMUNITY): Payer: Medicare Other

## 2022-12-31 DIAGNOSIS — E785 Hyperlipidemia, unspecified: Secondary | ICD-10-CM | POA: Diagnosis not present

## 2022-12-31 DIAGNOSIS — R931 Abnormal findings on diagnostic imaging of heart and coronary circulation: Secondary | ICD-10-CM | POA: Diagnosis present

## 2022-12-31 DIAGNOSIS — I10 Essential (primary) hypertension: Secondary | ICD-10-CM | POA: Diagnosis not present

## 2022-12-31 LAB — EXERCISE TOLERANCE TEST
Angina Index: 0
Duke Treadmill Score: 4
Estimated workload: 5.8
Exercise duration (min): 4 min
Exercise duration (sec): 0 s
MPHR: 141 {beats}/min
Peak HR: 109 {beats}/min
Percent HR: 77 %
RPE: 18
Rest HR: 68 {beats}/min
ST Depression (mm): 0 mm

## 2023-01-10 NOTE — Progress Notes (Deleted)
NEUROLOGY FOLLOW UP OFFICE NOTE  Justin Adams [id number]  Assessment/Plan:   Primary stabbing headache, resolved History of cerebral venous thrombosis with right temporal intracerebral hemorrhage.   1  Will discontinue gabapentin completely, as headaches are likely resolved on its own 2  Follow up as needed.   Subjective:  Justin Adams is an 77 year old right-handed male with hypertension, hypercholesterolemia, and OSA, history of prostate cancer, and history of cerebral hemorrhage secondary to cerebral venous thrombosis who follows up for headache.   UPDATE: Discontinued gabapentin on 7/22.    HISTORY: He was admitted to [facility] on 07/09/17 for right frontal headache and nausea.  MRI and MRV of head showed right temporal intracerebral hemorrhage with thromboses of the right transverse sinus, sigmoid sinus, and right jugular vein, which was confirmed on diagnostic angiogram.  MRA of the head showed no intracranial arterial abnormality such as aneurysm.  CT of the chest abdomen and pelvis revealed no malignancy.  2D echocardiogram revealed ejection fraction of 55%.  DRVVT borderline elevated at 49.7 but otherwise hypercoagulable panel was negative.  LDL was 103 and hemoglobin A1c was 5.3.  He has history of polycythemia with Hgb level 17.3-17.5 going back several years.  Hgb on day of admission was 17.5.  Subsequent Hgb levels have been around 16.  He was started on IV heparin and was subsequently transitioned to Eliquis.  He was also discharged on Lipitor 20 mg daily.   He later had a CTV on 09/05/17 which demonstrated interval resolution of the right temporal lobe intraparenchymal hemorrhage with residual encephalomalacia and interval improvement/parital recanalization of right-sided dural venous thrombosis with persistent nonocclusive thrombus within the right transverse and sigmoid sinuses and proximal right internal jugular vein.   In November 2021, he began  experiencing new mild to moderate paroxysmal jabs in the right frontal region every 5 to 20 minutes.  No severe headache such as how he presented for the cerebral hemorrhage and thrombosis.  No dizziness, sensory deficits, speech deficits, or focal weakness.  He has macular degeneration but no new visual disturbance. He has tried Tylenol and Advil which have been ineffective.  He has no prior history of headaches and this headache is in the same area when he had the ICH.  CT/CTA of head on 06/16/2020 personally reviewed showed sequelae of prior parenchymal hemorrhage and persistent partial thrombosis of right transverse and sigmoid sinuses and persistent multifocal nonocculsive thrombus within the superior sagittal sinus but no acute or new intracranial abnormalities.  Melatonin ineffective.  Responded to gabapentin.    PAST MEDICAL HISTORY: Past Medical History:  Diagnosis Date   Adenomatous polyp    Bleeding internal hemorrhoids 10/2017   Cerebral venous thrombosis    Grade I diastolic dysfunction 07/11/2017   noted on ECHO    Hemorrhoids    Hypercholesteremia    Hypertension    Intracranial hemorrhage (HCC)    Right temporal lobe   LVH (left ventricular hypertrophy) 07/11/2017   Mild, noted on ECHO    Muscular degeneration    Nephrolithiasis    Obesity    OSA (obstructive sleep apnea)    Polycythemia    Prostate cancer (HCC) 2003   Shingles    Stroke (HCC) 06/2017   No residual   Ureteral calculi 02/12/2008   Left    MEDICATIONS: Current Outpatient Medications on File Prior to Visit  Medication Sig Dispense Refill   apixaban (ELIQUIS) 5 MG TABS tablet Take 1 tablet (5 mg total) by  mouth 2 (two) times daily. 180 tablet 0   atorvastatin (LIPITOR) 20 MG tablet Take 1 tablet (20 mg total) by mouth daily. 90 tablet 0   gabapentin (NEURONTIN) 300 MG capsule Take 1 capsule (300 mg total) by mouth 2 (two) times daily. 60 capsule 11   lisinopril (PRINIVIL,ZESTRIL) 20 MG tablet Take 1  tablet (20 mg total) by mouth daily. 30 tablet 1   No current facility-administered medications on file prior to visit.     ALLERGIES: Allergies  Allergen Reactions   Codeine Nausea And Vomiting    FAMILY HISTORY: Family History  Problem Relation Age of Onset   Hypertension Mother    Coronary artery disease Mother 36   Breast cancer Mother    Coronary artery disease Father 42       Died age 27   CAD Brother 53      Objective:  Blood pressure 111/65, pulse 75, height 6' (1.829 m), weight 225 lb 3.2 oz (102.2 kg), SpO2 97%. General: No acute distress.  Patient appears well-groomed.   Head:  Normocephalic/atraumatic Neck:  Supple.  No paraspinal tenderness.  Full range of motion. Heart:  Regular rate and rhythm. Neuro:  alert and oriented.  Speech fluent and not dysarthric, language intact.  CN II-XII intact. Bulk and tone normal, muscle strength 5/5 throughout.  Sensation to light touch intact.  Deep tendon reflexes 2+ throughout.  Finger to nose testing intact.  Gait normal, Romberg negative.   Shon Millet, DO  CC: Eleanora Neighbor, MD

## 2023-01-20 ENCOUNTER — Ambulatory Visit: Payer: Medicare Other | Admitting: Neurology

## 2023-02-26 ENCOUNTER — Ambulatory Visit: Payer: Medicare Other | Admitting: Podiatry

## 2023-02-26 DIAGNOSIS — M79675 Pain in left toe(s): Secondary | ICD-10-CM | POA: Diagnosis not present

## 2023-02-26 DIAGNOSIS — B351 Tinea unguium: Secondary | ICD-10-CM | POA: Diagnosis not present

## 2023-02-26 DIAGNOSIS — M79674 Pain in right toe(s): Secondary | ICD-10-CM

## 2023-02-26 NOTE — Progress Notes (Signed)
   SUBJECTIVE Patient presents to office today complaining of elongated, thickened nails that cause pain while ambulating in shoes.  He is unable to trim his own nails. Patient is here for further evaluation and treatment.  Past Medical History:  Diagnosis Date   Adenomatous polyp    Bleeding internal hemorrhoids 10/2017   Cerebral venous thrombosis    Grade I diastolic dysfunction 07/11/2017   noted on ECHO    Hemorrhoids    Hypercholesteremia    Hypertension    Intracranial hemorrhage (HCC)    Right temporal lobe   LVH (left ventricular hypertrophy) 07/11/2017   Mild, noted on ECHO    Muscular degeneration    Nephrolithiasis    Obesity    OSA (obstructive sleep apnea)    Polycythemia    Prostate cancer (HCC) 2003   Shingles    Stroke (HCC) 06/2017   No residual   Ureteral calculi 02/12/2008   Left    OBJECTIVE General Patient is awake, alert, and oriented x 3 and in no acute distress. Derm Skin is dry and supple bilateral. Negative open lesions or macerations. Remaining integument unremarkable. Nails are tender, long, thickened and dystrophic with subungual debris, consistent with onychomycosis, 1-5 bilateral. No signs of infection noted. Vasc  DP and PT pedal pulses palpable bilaterally. Temperature gradient within normal limits.  Neuro Epicritic and protective threshold sensation grossly intact bilaterally.  Musculoskeletal Exam No symptomatic pedal deformities noted bilateral. Muscular strength within normal limits.  ASSESSMENT 1. Onychodystrophic nails 1-5 bilateral with hyperkeratosis of nails.  2. Onychomycosis of nail due to dermatophyte bilateral 3. Pain in foot bilateral  PLAN OF CARE 1. Patient evaluated today.  2. Instructed to maintain good pedal hygiene and foot care.  3. Mechanical debridement of nails 1-5 bilaterally performed using a nail nipper. Filed with dremel without incident.  4. Return to clinic in 3 mos.    Felecia Shelling, DPM [facility]  Dr. Felecia Shelling, DPM    [address] [place], [place] [address]                Office [phone]  Fax [phone]

## 2023-05-01 ENCOUNTER — Other Ambulatory Visit (HOSPITAL_COMMUNITY): Payer: Self-pay | Admitting: Urology

## 2023-05-01 DIAGNOSIS — R109 Unspecified abdominal pain: Secondary | ICD-10-CM

## 2023-05-01 DIAGNOSIS — N135 Crossing vessel and stricture of ureter without hydronephrosis: Secondary | ICD-10-CM

## 2023-05-12 ENCOUNTER — Ambulatory Visit (HOSPITAL_COMMUNITY)
Admission: RE | Admit: 2023-05-12 | Discharge: 2023-05-12 | Disposition: A | Discharge status: Home / Self Care | Payer: Medicare Other | Source: Ambulatory Visit | Attending: Urology | Admitting: Urology

## 2023-05-12 DIAGNOSIS — Z87442 Personal history of urinary calculi: Secondary | ICD-10-CM | POA: Diagnosis present

## 2023-05-12 DIAGNOSIS — R109 Unspecified abdominal pain: Secondary | ICD-10-CM | POA: Insufficient documentation

## 2023-05-12 DIAGNOSIS — N135 Crossing vessel and stricture of ureter without hydronephrosis: Secondary | ICD-10-CM | POA: Insufficient documentation

## 2023-05-12 MED ORDER — FUROSEMIDE 10 MG/ML IJ SOLN
INTRAMUSCULAR | Status: AC
  Filled 2023-05-12: qty 8

## 2023-05-12 MED ORDER — FUROSEMIDE 10 MG/ML IJ SOLN: 50.0000 mg | Freq: Once | INTRAMUSCULAR | Status: DC

## 2023-05-12 MED ORDER — TECHNETIUM TC 99M MERTIATIDE
5.0000 | Freq: Once | INTRAVENOUS | Status: AC
  Administered 2023-05-12: 5.23 via INTRAVENOUS

## 2023-05-28 ENCOUNTER — Ambulatory Visit (INDEPENDENT_AMBULATORY_CARE_PROVIDER_SITE_OTHER): Payer: Medicare Other | Admitting: Podiatry

## 2023-05-28 DIAGNOSIS — M79675 Pain in left toe(s): Secondary | ICD-10-CM | POA: Diagnosis not present

## 2023-05-28 DIAGNOSIS — B351 Tinea unguium: Secondary | ICD-10-CM | POA: Diagnosis not present

## 2023-05-28 DIAGNOSIS — M79674 Pain in right toe(s): Secondary | ICD-10-CM

## 2023-05-28 NOTE — Progress Notes (Signed)
   SUBJECTIVE Patient presents to office today complaining of elongated, thickened nails that cause pain while ambulating in shoes.  He is unable to trim his own nails. Patient is here for further evaluation and treatment.  Past Medical History:  Diagnosis Date   Adenomatous polyp    Bleeding internal hemorrhoids 10/2017   Cerebral venous thrombosis    Grade I diastolic dysfunction 07/11/2017   noted on ECHO    Hemorrhoids    Hypercholesteremia    Hypertension    Intracranial hemorrhage (HCC)    Right temporal lobe   LVH (left ventricular hypertrophy) 07/11/2017   Mild, noted on ECHO    Muscular degeneration    Nephrolithiasis    Obesity    OSA (obstructive sleep apnea)    Polycythemia    Prostate cancer (HCC) 2003   Shingles    Stroke (HCC) 06/2017   No residual   Ureteral calculi 02/12/2008   Left    OBJECTIVE General Patient is awake, alert, and oriented x 3 and in no acute distress. Derm Skin is dry and supple bilateral. Negative open lesions or macerations. Remaining integument unremarkable. Nails are tender, long, thickened and dystrophic with subungual debris, consistent with onychomycosis, 1-5 bilateral. No signs of infection noted. Vasc  DP and PT pedal pulses palpable bilaterally. Temperature gradient within normal limits.  Neuro Epicritic and protective threshold sensation grossly intact bilaterally.  Musculoskeletal Exam No symptomatic pedal deformities noted bilateral. Muscular strength within normal limits.  ASSESSMENT 1. Onychodystrophic nails 1-5 bilateral with hyperkeratosis of nails.  2. Onychomycosis of nail due to dermatophyte bilateral 3. Pain in foot bilateral  PLAN OF CARE 1. Patient evaluated today.  2. Instructed to maintain good pedal hygiene and foot care.  3. Mechanical debridement of nails 1-5 bilaterally performed using a nail nipper. Filed with dremel without incident.  4. Return to clinic in 3 mos.    Felecia Shelling, DPM [facility]  Dr. Felecia Shelling, DPM    [address] [place], [place] [address]                Office [phone]  Fax [phone]

## 2023-05-29 DIAGNOSIS — R911 Solitary pulmonary nodule: Secondary | ICD-10-CM | POA: Insufficient documentation

## 2023-05-29 NOTE — Progress Notes (Signed)
Cardiology Office Note:   Date:  05/30/2023  ID:  Justin Adams, DOB 1946-04-15, MRN [id number] PCP: Emilio Aspen, MD   HeartCare Providers Cardiologist:  Rollene Rotunda, MD {  History of Present Illness:   Justin Adams is a 77 y.o. male who presents for evaluation of elevated coronary calcium.  He has no past cardiac history.  He did have a cerebral venous thrombosis involving the right transverse sinus, sigmoid sinus and jugular vein with right temporal intracranial hemorrhage in 2019.  He had an EF of 55% at that time.  He has been treated with DOAC since then.  He does have hypertension and dyslipidemia.  He was noted on screening CT to have a score of 1572 which was 80th percentile.  There is a small lung nodule.  The patient denies any new symptoms such as chest discomfort, neck or arm discomfort. There has been no new shortness of breath, PND or orthopnea. There have been no reported palpitations, presyncope or syncope.  He had a submaximal stress test.  This had no ischemic changes.   Since I last saw him he fell and and injured his knee.  He is not able to get back to the gym.  He was walking about a mile a day.  He is frustrated because of this.  He has had no new shortness of breath, PND or orthopnea.  He has had no new palpitations, presyncope or syncope.  He has had no weight gain or edema.  ROS: As stated in the HPI and negative for all other systems.  Studies Reviewed:    EKG:     Sinus rhythm, rate 72, axis within normal limits, intervals within normal limits, early transition lead V2, no acute ST-T wave changes.  12/04/2022   Risk Assessment/Calculations:              Physical Exam:   VS:  BP (!) 106/54   Pulse 76   Ht 6\' 1"  (1.854 m)   Wt 240 lb (108.9 kg)   SpO2 97%   BMI 31.66 kg/m    Wt Readings from Last 3 Encounters:  05/30/23 240 lb (108.9 kg)  12/31/22 220 lb (99.8 kg)  12/30/22 225 lb 3.2 oz (102.2 kg)     GEN: Well  nourished, well developed in no acute distress NECK: No JVD; No carotid bruits CARDIAC: RRR, no murmurs, rubs, gallops RESPIRATORY:  Clear to auscultation without rales, wheezing or rhonchi  ABDOMEN: Soft, non-tender, non-distended EXTREMITIES:  Trace leg edema; No deformity   ASSESSMENT AND PLAN:   Elevated coronary calcium: We had a long discussion again about his lack of symptoms.  He would prefer conservative therapy and I think this is appropriate but if he develops any exercise intolerance or shortness of breath or chest pain or other symptoms I would have a low threshold for further imaging and he will let me know.   HTN: The blood pressure is at target.  No change in therapy.  Dyslipidemia: LDL 70.  I will increase his Lipitor to 40 mg daily and get a lipid profile and LP(a) in 3 months.  Lung nodule: He had a 4 mm lung nodule on his calcium score.  I will follow-up with a noncontrasted CT in April of next year.     Chronic anticoagulation: He is on this for his cerebral thrombosis.  No change in therapy.  Follow up follow-up with me in 1 year.  Signed, Rollene Rotunda, MD

## 2023-05-30 ENCOUNTER — Ambulatory Visit: Payer: Medicare Other | Attending: Cardiology | Admitting: Cardiology

## 2023-05-30 ENCOUNTER — Encounter: Payer: Self-pay | Admitting: Cardiology

## 2023-05-30 VITALS — BP 106/54 | HR 76 | Ht 73.0 in | Wt 240.0 lb

## 2023-05-30 DIAGNOSIS — R911 Solitary pulmonary nodule: Secondary | ICD-10-CM | POA: Diagnosis not present

## 2023-05-30 DIAGNOSIS — E785 Hyperlipidemia, unspecified: Secondary | ICD-10-CM

## 2023-05-30 DIAGNOSIS — R931 Abnormal findings on diagnostic imaging of heart and coronary circulation: Secondary | ICD-10-CM

## 2023-05-30 DIAGNOSIS — I10 Essential (primary) hypertension: Secondary | ICD-10-CM | POA: Diagnosis not present

## 2023-05-30 MED ORDER — ATORVASTATIN CALCIUM 20 MG PO TABS: 40.0000 mg | ORAL_TABLET | Freq: Every day | ORAL | 3 refills | Status: DC

## 2023-05-30 NOTE — Patient Instructions (Signed)
Medication Instructions:  Increase dose of lipitor to 40 mg by mouth daily. New script sent *If you need a refill on your cardiac medications before your next appointment, please call your pharmacy*   Lab Work: Fasting Lipid, Lpa in 3 months. If you have labs (blood work) drawn today and your tests are completely normal, you will receive your results only by: MyChart Message (if you have MyChart) OR A paper copy in the mail If you have any lab test that is abnormal or we need to change your treatment, we will call you to review the results.   Testing/Procedures: Non-Cardiac CT scanning in April 2025. (CAT scanning), is a noninvasive, special x-ray that produces cross-sectional images of the body using x-rays and a computer. CT scans help physicians diagnose and treat medical conditions. For some CT exams, a contrast material is used to enhance visibility in the area of the body being studied. CT scans provide greater clarity and reveal more details than regular x-ray exams.    Follow-Up: At [facility], you and your health needs are our priority.  As part of our continuing mission to provide you with exceptional heart care, we have created designated Provider Care Teams.  These Care Teams include your primary Cardiologist (physician) and Advanced Practice Providers (APPs -  Physician Assistants and Nurse Practitioners) who all work together to provide you with the care you need, when you need it.  Your next appointment:   12 month(s)  Provider:   Rollene Rotunda, MD

## 2023-06-05 ENCOUNTER — Other Ambulatory Visit: Payer: Self-pay

## 2023-06-05 DIAGNOSIS — E785 Hyperlipidemia, unspecified: Secondary | ICD-10-CM

## 2023-06-05 MED ORDER — ATORVASTATIN CALCIUM 40 MG PO TABS: 40.0000 mg | ORAL_TABLET | Freq: Every day | ORAL | 3 refills | Status: DC

## 2023-06-05 NOTE — Telephone Encounter (Signed)
[facility] pharmacy is stating that pt's insurance is only covering 1 tablet daily. Please address

## 2023-06-13 ENCOUNTER — Other Ambulatory Visit: Payer: Self-pay

## 2023-06-13 DIAGNOSIS — E785 Hyperlipidemia, unspecified: Secondary | ICD-10-CM

## 2023-06-13 MED ORDER — ATORVASTATIN CALCIUM 40 MG PO TABS: 40.0000 mg | ORAL_TABLET | Freq: Every day | ORAL | 3 refills | Status: DC

## 2023-08-15 LAB — LIPID PANEL
Chol/HDL Ratio: 2.6 ratio (ref 0.0–5.0)
Cholesterol, Total: 129 mg/dL (ref 100–199)
HDL: 50 mg/dL (ref >39)
LDL Chol Calc (NIH): 65 mg/dL (ref 0–99)
Triglycerides: 65 mg/dL (ref 0–149)
VLDL Cholesterol Cal: 14 mg/dL (ref 5–40)

## 2023-08-15 LAB — LIPOPROTEIN A (LPA): Lipoprotein (a): <8.4 nmol/L (ref <75.0)

## 2023-08-25 ENCOUNTER — Telehealth: Payer: Self-pay | Admitting: Cardiology

## 2023-08-25 NOTE — Telephone Encounter (Signed)
 Pt c/o medication issue:  1. Name of Medication:   atorvastatin (LIPITOR) 40 MG tablet    2. How are you currently taking this medication (dosage and times per day)?  Once daily   3. Are you having a reaction (difficulty breathing--STAT)?   4. What is your medication issue?    Patient states PCP recommends switching to Rosuvastatin because Atorvastatin has been causing issues with his knee.

## 2023-08-25 NOTE — Telephone Encounter (Signed)
 Spoke with pt, okay given for him to use rosuvastatin in place of atorvastatin. He thinks the dose will be 20 mg.

## 2023-08-27 ENCOUNTER — Encounter: Payer: Self-pay | Admitting: Podiatry

## 2023-08-27 ENCOUNTER — Ambulatory Visit: Payer: Medicare Other | Admitting: Podiatry

## 2023-08-27 VITALS — Ht 73.0 in | Wt 240.0 lb

## 2023-08-27 DIAGNOSIS — B351 Tinea unguium: Secondary | ICD-10-CM | POA: Diagnosis not present

## 2023-08-27 DIAGNOSIS — M79674 Pain in right toe(s): Secondary | ICD-10-CM | POA: Diagnosis not present

## 2023-08-27 DIAGNOSIS — M79675 Pain in left toe(s): Secondary | ICD-10-CM

## 2023-08-28 NOTE — Progress Notes (Signed)
   SUBJECTIVE Patient presents to office today complaining of elongated, thickened nails that cause pain while ambulating in shoes.  He is unable to trim his own nails. Patient is here for further evaluation and treatment.  Past Medical History:  Diagnosis Date   Adenomatous polyp    Bleeding internal hemorrhoids 10/2017   Cerebral venous thrombosis    Grade I diastolic dysfunction 07/11/2017   noted on ECHO    Hemorrhoids    Hypercholesteremia    Hypertension    Intracranial hemorrhage (HCC)    Right temporal lobe   LVH (left ventricular hypertrophy) 07/11/2017   Mild, noted on ECHO    Muscular degeneration    Nephrolithiasis    Obesity    OSA (obstructive sleep apnea)    Polycythemia    Prostate cancer (HCC) 2003   Shingles    Stroke (HCC) 06/2017   No residual   Ureteral calculi 02/12/2008   Left    OBJECTIVE General Patient is awake, alert, and oriented x 3 and in no acute distress. Derm Skin is dry and supple bilateral. Negative open lesions or macerations. Remaining integument unremarkable. Nails are tender, long, thickened and dystrophic with subungual debris, consistent with onychomycosis, 1-5 bilateral. No signs of infection noted. Vasc  DP and PT pedal pulses palpable bilaterally. Temperature gradient within normal limits.  Neuro Epicritic and protective threshold sensation grossly intact bilaterally.  Musculoskeletal Exam No symptomatic pedal deformities noted bilateral. Muscular strength within normal limits.  ASSESSMENT 1. Onychodystrophic nails 1-5 bilateral with hyperkeratosis of nails.  2. Onychomycosis of nail due to dermatophyte bilateral 3. Pain in foot bilateral  PLAN OF CARE 1. Patient evaluated today.  2. Instructed to maintain good pedal hygiene and foot care.  3. Mechanical debridement of nails 1-5 bilaterally performed using a nail nipper. Filed with dremel without incident.  4. Return to clinic in 3 mos.    Felecia Shelling, DPM [facility]  Dr. Felecia Shelling, DPM    [address] [place], [place] [address]                Office [phone]  Fax [phone]

## 2023-09-22 ENCOUNTER — Ambulatory Visit (HOSPITAL_COMMUNITY)
Admission: RE | Admit: 2023-09-22 | Discharge: 2023-09-22 | Disposition: A | Discharge status: Home / Self Care | Payer: Medicare Other | Source: Ambulatory Visit | Attending: Cardiology | Admitting: Cardiology

## 2023-09-22 DIAGNOSIS — R911 Solitary pulmonary nodule: Secondary | ICD-10-CM | POA: Diagnosis present

## 2023-10-01 ENCOUNTER — Telehealth: Payer: Self-pay | Admitting: Cardiology

## 2023-10-01 NOTE — Telephone Encounter (Signed)
 Pt calling for CT chest resuls, requesting cb

## 2023-10-01 NOTE — Telephone Encounter (Signed)
 Patient identification verified by 2 forms. Sims Duck, RN     Called and spoke to patient.  Informed patient that results of CT have not been reviewed by Dr. Abbie Abbey at this time.   Patient verbalized understanding. No further questions at this time.

## 2023-10-21 ENCOUNTER — Ambulatory Visit: Payer: Self-pay | Admitting: *Deleted

## 2023-12-03 ENCOUNTER — Ambulatory Visit (INDEPENDENT_AMBULATORY_CARE_PROVIDER_SITE_OTHER): Admitting: Podiatry

## 2023-12-03 ENCOUNTER — Encounter: Payer: Self-pay | Admitting: Podiatry

## 2023-12-03 VITALS — Ht 73.0 in | Wt 240.0 lb

## 2023-12-03 DIAGNOSIS — M79674 Pain in right toe(s): Secondary | ICD-10-CM

## 2023-12-03 DIAGNOSIS — B351 Tinea unguium: Secondary | ICD-10-CM | POA: Diagnosis not present

## 2023-12-03 DIAGNOSIS — M79675 Pain in left toe(s): Secondary | ICD-10-CM | POA: Diagnosis not present

## 2023-12-03 NOTE — Progress Notes (Signed)
   SUBJECTIVE Patient presents to office today complaining of elongated, thickened nails that cause pain while ambulating in shoes.  He is unable to trim his own nails. Patient is here for further evaluation and treatment.  Past Medical History:  Diagnosis Date   Adenomatous polyp    Bleeding internal hemorrhoids 10/2017   Cerebral venous thrombosis    Grade I diastolic dysfunction 07/11/2017   noted on ECHO    Hemorrhoids    Hypercholesteremia    Hypertension    Intracranial hemorrhage (HCC)    Right temporal lobe   LVH (left ventricular hypertrophy) 07/11/2017   Mild, noted on ECHO    Muscular degeneration    Nephrolithiasis    Obesity    OSA (obstructive sleep apnea)    Polycythemia    Prostate cancer (HCC) 2003   Shingles    Stroke (HCC) 06/2017   No residual   Ureteral calculi 02/12/2008   Left    OBJECTIVE General Patient is awake, alert, and oriented x 3 and in no acute distress. Derm Skin is dry and supple bilateral. Negative open lesions or macerations. Remaining integument unremarkable. Nails are tender, long, thickened and dystrophic with subungual debris, consistent with onychomycosis, 1-5 bilateral. No signs of infection noted. Vasc  DP and PT pedal pulses palpable bilaterally. Temperature gradient within normal limits.  Neuro Epicritic and protective threshold sensation grossly intact bilaterally.  Musculoskeletal Exam No symptomatic pedal deformities noted bilateral. Muscular strength within normal limits.  ASSESSMENT 1. Onychodystrophic nails 1-5 bilateral with hyperkeratosis of nails.  2. Onychomycosis of nail due to dermatophyte bilateral 3. Pain in foot bilateral  PLAN OF CARE 1. Patient evaluated today.  2. Instructed to maintain good pedal hygiene and foot care.  3. Mechanical debridement of nails 1-5 bilaterally performed using a nail nipper. Filed with dremel without incident.  4. Return to clinic in 3 mos.    Felecia Shelling, DPM [facility]  Dr. Felecia Shelling, DPM    [address] [place], [place] [address]                Office [phone]  Fax [phone]

## 2024-02-04 NOTE — Progress Notes (Unsigned)
  Cardiology Office Note:   Date:  02/06/2024  ID:  Justin Adams, DOB 1945/09/14, MRN [id number] PCP: Charlott Dorn DELENA, MD   HeartCare Providers Cardiologist:  Lynwood Schilling, MD {  History of Present Illness:   Justin Adams is a 78 y.o. male who I saw for evaluation of an elevated coronary calcium  score.  He presents for evaluation of elevated coronary calcium .  He did have a cerebral venous thrombosis involving the right transverse sinus, sigmoid sinus and jugular vein with right temporal intracranial hemorrhage in 2019.  He has been on a DOAC since then.  He had a calcium  score of 1572 which was 80th percentile.  I did send him for a treadmill test that was submaximal but there were no high risk findings.  He has done well since I saw him.  He continues to be asymptomatic.  He is not having any chest pressure, neck or arm discomfort.  Is not having any shortness of breath, PND or orthopnea.  He has no palpitation, presyncope or syncope.  He is actually lost weight since I last saw him.  ROS: As stated in the HPI and negative for all other systems.  Studies Reviewed:    EKG:   EKG Interpretation Date/Time:  Friday February 06 2024 09:02:54 EDT Ventricular Rate:  60 PR Interval:  184 QRS Duration:  86 QT Interval:  430 QTC Calculation: 430 R Axis:   27  Text Interpretation: Normal sinus rhythm Normal ECG When compared with ECG of 04-Dec-2022 08:47, No significant change was found Confirmed by Schilling Lynwood ([id number]) on 02/06/2024 9:15:58 AM    Risk Assessment/Calculations:              Physical Exam:   VS:  BP 125/61   Pulse 60   Ht 6' 1 (1.854 m)   Wt 202 lb (91.6 kg)   SpO2 98%   BMI 26.65 kg/m    Wt Readings from Last 3 Encounters:  02/06/24 202 lb (91.6 kg)  12/03/23 240 lb (108.9 kg)  08/27/23 240 lb (108.9 kg)     GEN: Well nourished, well developed in no acute distress NECK: No JVD; No carotid bruits CARDIAC: RRR, no murmurs, rubs,  gallops RESPIRATORY:  Clear to auscultation without rales, wheezing or rhonchi  ABDOMEN: Soft, non-tender, non-distended EXTREMITIES:  No edema; No deformity   ASSESSMENT AND PLAN:   Elevated coronary calcium : The patient's had no new symptoms.  We discussed primary risk reduction.  No change in therapy.  HTN: The blood pressure is at target.  No change in therapy.   Dyslipidemia: LDL was 48 with an HDL of 42.  No change in therapy.   Lung nodule: He had a 4 mm lung nodule on his calcium  score.  He needs follow up.  I will schedule follow-up noncontrasted CT.   Chronic anticoagulation:   He is on this for his cerebral thrombosis.  He tolerates this.  No change in therapy.     Follow up with me in 2 years  Signed, Lynwood Schilling, MD

## 2024-02-06 ENCOUNTER — Encounter: Payer: Self-pay | Admitting: Cardiology

## 2024-02-06 ENCOUNTER — Ambulatory Visit: Attending: Cardiology | Admitting: Cardiology

## 2024-02-06 VITALS — BP 125/61 | HR 60 | Ht 73.0 in | Wt 202.0 lb

## 2024-02-06 DIAGNOSIS — I10 Essential (primary) hypertension: Secondary | ICD-10-CM

## 2024-02-06 DIAGNOSIS — R911 Solitary pulmonary nodule: Secondary | ICD-10-CM

## 2024-02-06 DIAGNOSIS — E785 Hyperlipidemia, unspecified: Secondary | ICD-10-CM

## 2024-02-06 DIAGNOSIS — R931 Abnormal findings on diagnostic imaging of heart and coronary circulation: Secondary | ICD-10-CM

## 2024-02-06 NOTE — Patient Instructions (Signed)
 Medication Instructions:  Your physician recommends that you continue on your current medications as directed. Please refer to the Current Medication list given to you today.  *If you need a refill on your cardiac medications before your next appointment, please call your pharmacy*  Lab Work: NONE If you have labs (blood work) drawn today and your tests are completely normal, you will receive your results only by: MyChart Message (if you have MyChart) OR A paper copy in the mail If you have any lab test that is abnormal or we need to change your treatment, we will call you to review the results.  Testing/Procedures: CT chest without contrast  Follow-Up: At [facility], you and your health needs are our priority.  As part of our continuing mission to provide you with exceptional heart care, our providers are all part of one team.  This team includes your primary Cardiologist (physician) and Advanced Practice Providers or APPs (Physician Assistants and Nurse Practitioners) who all work together to provide you with the care you need, when you need it.  Your next appointment:   2 years  Provider:   [place], MD  We recommend signing up for the patient portal called MyChart.  Sign up information is provided on this After Visit Summary.  MyChart is used to connect with patients for Virtual Visits (Telemedicine).  Patients are able to view lab/test results, encounter notes, upcoming appointments, etc.  Non-urgent messages can be sent to your provider as well.   To learn more about what you can do with MyChart, go to [email or web address].

## 2024-02-16 ENCOUNTER — Ambulatory Visit (HOSPITAL_COMMUNITY)
Admission: RE | Admit: 2024-02-16 | Discharge: 2024-02-16 | Disposition: A | Discharge status: Home / Self Care | Source: Ambulatory Visit | Attending: Cardiology | Admitting: Cardiology

## 2024-02-16 DIAGNOSIS — R911 Solitary pulmonary nodule: Secondary | ICD-10-CM | POA: Insufficient documentation

## 2024-02-29 ENCOUNTER — Ambulatory Visit: Payer: Self-pay | Admitting: Cardiology

## 2024-03-08 ENCOUNTER — Encounter: Payer: Self-pay | Admitting: Podiatry

## 2024-03-08 ENCOUNTER — Ambulatory Visit: Admitting: Podiatry

## 2024-03-08 DIAGNOSIS — B351 Tinea unguium: Secondary | ICD-10-CM

## 2024-03-08 DIAGNOSIS — M79674 Pain in right toe(s): Secondary | ICD-10-CM | POA: Diagnosis not present

## 2024-03-08 DIAGNOSIS — M79675 Pain in left toe(s): Secondary | ICD-10-CM | POA: Diagnosis not present

## 2024-03-09 NOTE — Progress Notes (Signed)
   SUBJECTIVE Patient presents to office today complaining of elongated, thickened nails that cause pain while ambulating in shoes.  He is unable to trim his own nails. Patient is here for further evaluation and treatment.  Past Medical History:  Diagnosis Date   Adenomatous polyp    Bleeding internal hemorrhoids 10/2017   Cerebral venous thrombosis    Grade I diastolic dysfunction 07/11/2017   noted on ECHO    Hemorrhoids    Hypercholesteremia    Hypertension    Intracranial hemorrhage (HCC)    Right temporal lobe   LVH (left ventricular hypertrophy) 07/11/2017   Mild, noted on ECHO    Muscular degeneration    Nephrolithiasis    Obesity    OSA (obstructive sleep apnea)    Polycythemia    Prostate cancer (HCC) 2003   Shingles    Stroke (HCC) 06/2017   No residual   Ureteral calculi 02/12/2008   Left    OBJECTIVE General Patient is awake, alert, and oriented x 3 and in no acute distress. Derm Skin is dry and supple bilateral. Negative open lesions or macerations. Remaining integument unremarkable. Nails are tender, long, thickened and dystrophic with subungual debris, consistent with onychomycosis, 1-5 bilateral. No signs of infection noted. Vasc  DP and PT pedal pulses palpable bilaterally. Temperature gradient within normal limits.  Neuro Epicritic and protective threshold sensation grossly intact bilaterally.  Musculoskeletal Exam No symptomatic pedal deformities noted bilateral. Muscular strength within normal limits.  ASSESSMENT 1. Onychodystrophic nails 1-5 bilateral with hyperkeratosis of nails.  2. Onychomycosis of nail due to dermatophyte bilateral 3. Pain in foot bilateral  PLAN OF CARE 1. Patient evaluated today.  2. Instructed to maintain good pedal hygiene and foot care.  3. Mechanical debridement of nails 1-5 bilaterally performed using a nail nipper. Filed with dremel without incident.  4. Return to clinic in 3 mos.    Felecia Shelling, DPM [facility]  Dr. Felecia Shelling, DPM    [address] [place], [place] [address]                Office [phone]  Fax [phone]

## 2024-04-27 ENCOUNTER — Other Ambulatory Visit: Payer: Self-pay | Admitting: Oral Surgery

## 2024-04-30 LAB — SURGICAL PATHOLOGY

## 2024-06-02 ENCOUNTER — Ambulatory Visit (INDEPENDENT_AMBULATORY_CARE_PROVIDER_SITE_OTHER): Admitting: Podiatry

## 2024-06-02 ENCOUNTER — Encounter: Payer: Self-pay | Admitting: Podiatry

## 2024-06-02 VITALS — Ht 73.0 in | Wt 202.0 lb

## 2024-06-02 DIAGNOSIS — B351 Tinea unguium: Secondary | ICD-10-CM | POA: Diagnosis not present

## 2024-06-02 DIAGNOSIS — M79675 Pain in left toe(s): Secondary | ICD-10-CM

## 2024-06-02 DIAGNOSIS — M79674 Pain in right toe(s): Secondary | ICD-10-CM

## 2024-06-07 ENCOUNTER — Ambulatory Visit: Admitting: Podiatry

## 2024-06-09 NOTE — Progress Notes (Signed)
   SUBJECTIVE Patient presents to office today complaining of elongated, thickened nails that cause pain while ambulating in shoes.  He is unable to trim his own nails. Patient is here for further evaluation and treatment.  Past Medical History:  Diagnosis Date   Adenomatous polyp    Bleeding internal hemorrhoids 10/2017   Cerebral venous thrombosis    Grade I diastolic dysfunction 07/11/2017   noted on ECHO    Hemorrhoids    Hypercholesteremia    Hypertension    Intracranial hemorrhage (HCC)    Right temporal lobe   LVH (left ventricular hypertrophy) 07/11/2017   Mild, noted on ECHO    Muscular degeneration    Nephrolithiasis    Obesity    OSA (obstructive sleep apnea)    Polycythemia    Prostate cancer (HCC) 2003   Shingles    Stroke (HCC) 06/2017   No residual   Ureteral calculi 02/12/2008   Left    OBJECTIVE General Patient is awake, alert, and oriented x 3 and in no acute distress. Derm Skin is dry and supple bilateral. Negative open lesions or macerations. Remaining integument unremarkable. Nails are tender, long, thickened and dystrophic with subungual debris, consistent with onychomycosis, 1-5 bilateral. No signs of infection noted. Vasc  DP and PT pedal pulses palpable bilaterally. Temperature gradient within normal limits.  Neuro Epicritic and protective threshold sensation grossly intact bilaterally.  Musculoskeletal Exam No symptomatic pedal deformities noted bilateral. Muscular strength within normal limits.  ASSESSMENT 1. Onychodystrophic nails 1-5 bilateral with hyperkeratosis of nails.  2. Onychomycosis of nail due to dermatophyte bilateral 3. Pain in foot bilateral  PLAN OF CARE 1. Patient evaluated today.  2. Instructed to maintain good pedal hygiene and foot care.  3. Mechanical debridement of nails 1-5 bilaterally performed using a nail nipper. Filed with dremel without incident.  4. Return to clinic in 3 mos.    Felecia Shelling, DPM [facility]  Dr. Felecia Shelling, DPM    [address] [place], [place] [address]                Office [phone]  Fax [phone]

## 2024-09-01 ENCOUNTER — Ambulatory Visit: Admitting: Podiatry
